# Patient Record
Sex: Male | Born: 1970 | Hispanic: No | Smoking: Current every day smoker
Health system: Southern US, Community
[De-identification: ages and names within clinical notes are randomized; demographics above are authoritative.]

## PROBLEM LIST (undated history)

## (undated) DIAGNOSIS — C801 Malignant (primary) neoplasm, unspecified: Secondary | ICD-10-CM

## (undated) DIAGNOSIS — I10 Essential (primary) hypertension: Secondary | ICD-10-CM

## (undated) DIAGNOSIS — I639 Cerebral infarction, unspecified: Secondary | ICD-10-CM

## (undated) HISTORY — DX: Cerebral infarction, unspecified: I63.9

---

## 2011-01-20 ENCOUNTER — Inpatient Hospital Stay (INDEPENDENT_AMBULATORY_CARE_PROVIDER_SITE_OTHER)
Admission: RE | Admit: 2011-01-20 | Discharge: 2011-01-20 | Disposition: A | Discharge status: Home / Self Care | Payer: Self-pay | Source: Ambulatory Visit | Attending: Family Medicine | Admitting: Family Medicine

## 2011-01-20 DIAGNOSIS — IMO0002 Reserved for concepts with insufficient information to code with codable children: Secondary | ICD-10-CM

## 2017-05-15 ENCOUNTER — Ambulatory Visit (HOSPITAL_COMMUNITY)
Admission: RE | Admit: 2017-05-15 | Discharge: 2017-05-15 | Disposition: A | Discharge status: Home / Self Care | Payer: Self-pay | Source: Ambulatory Visit | Attending: Internal Medicine | Admitting: Internal Medicine

## 2017-05-15 ENCOUNTER — Other Ambulatory Visit (HOSPITAL_COMMUNITY): Payer: Self-pay | Admitting: Internal Medicine

## 2017-05-15 DIAGNOSIS — M79604 Pain in right leg: Secondary | ICD-10-CM

## 2017-05-15 DIAGNOSIS — M79609 Pain in unspecified limb: Secondary | ICD-10-CM | POA: Insufficient documentation

## 2017-05-15 IMAGING — DX DG TIBIA/FIBULA 2V*R*
2 series · 2 of 2 positions shown · non-contrast
Comparison: None in PACs

CLINICAL DATA: Status post fall week 1 week ago with painful
erythematous area over the midshaft of the lower leg medially.

EXAM:
RIGHT TIBIA AND FIBULA - 2 VIEW

[x tib-fib ap right]
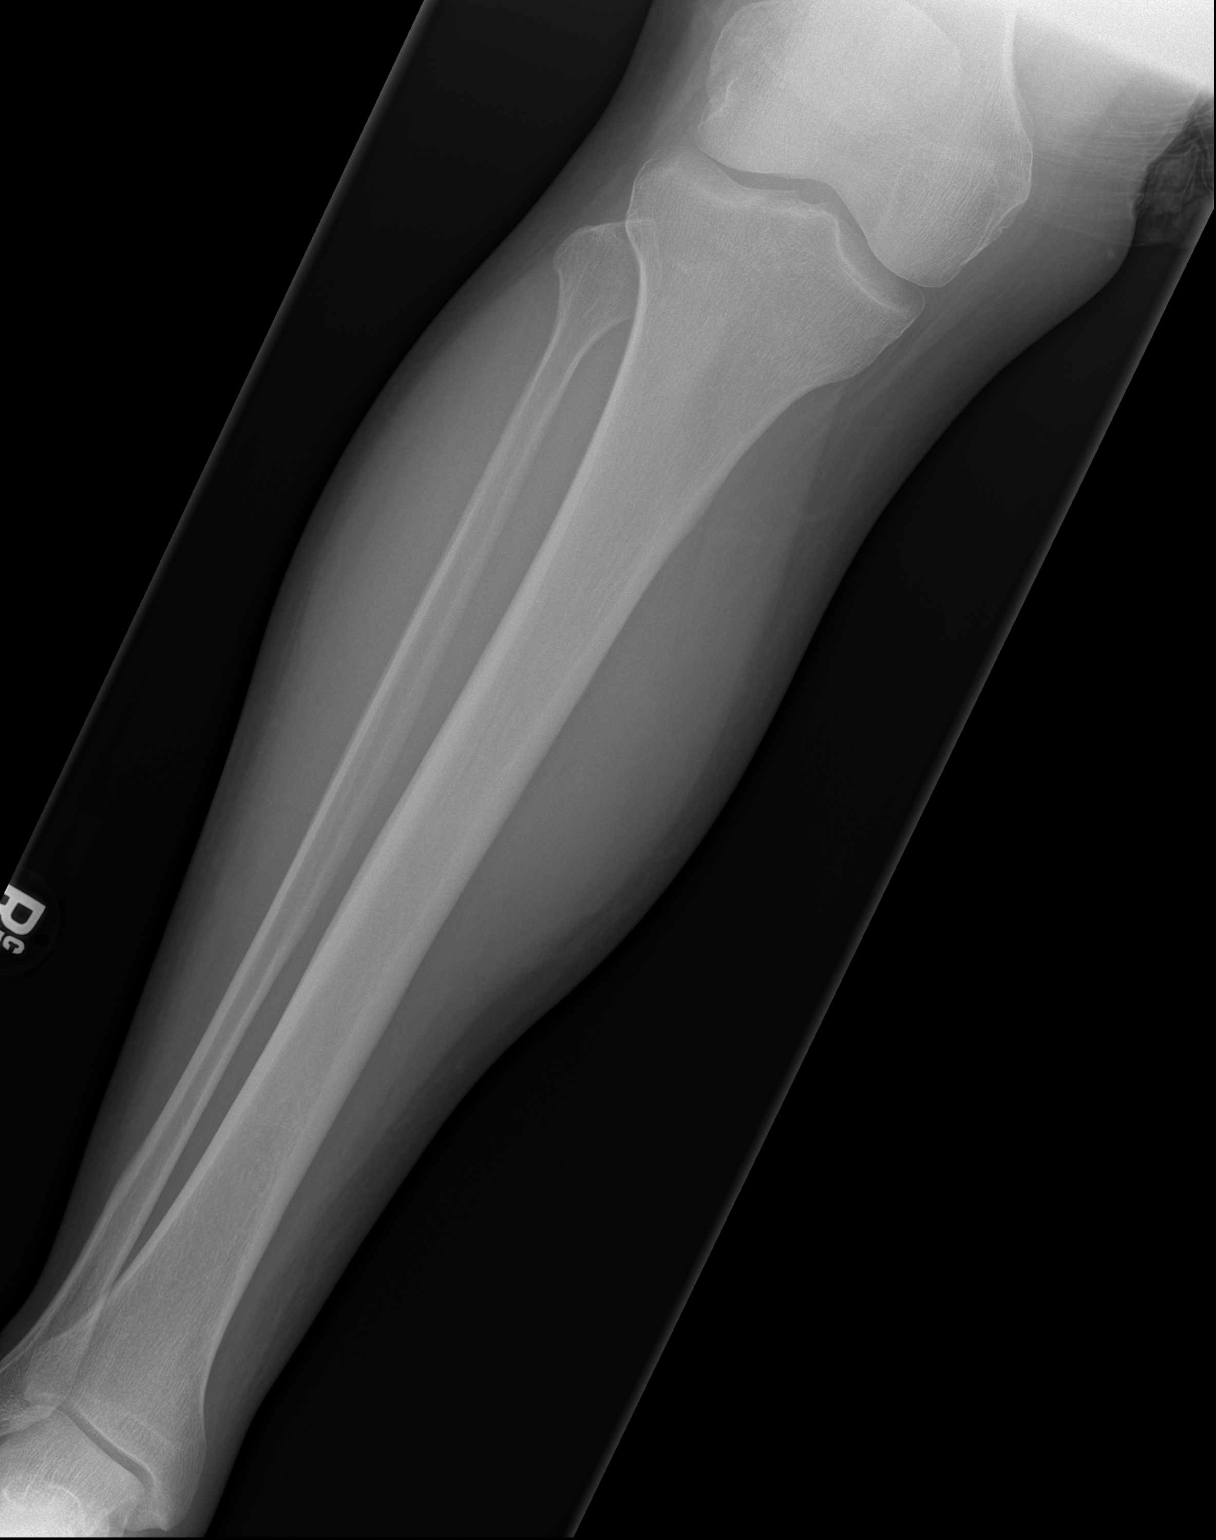

[x tib-fib lat right]
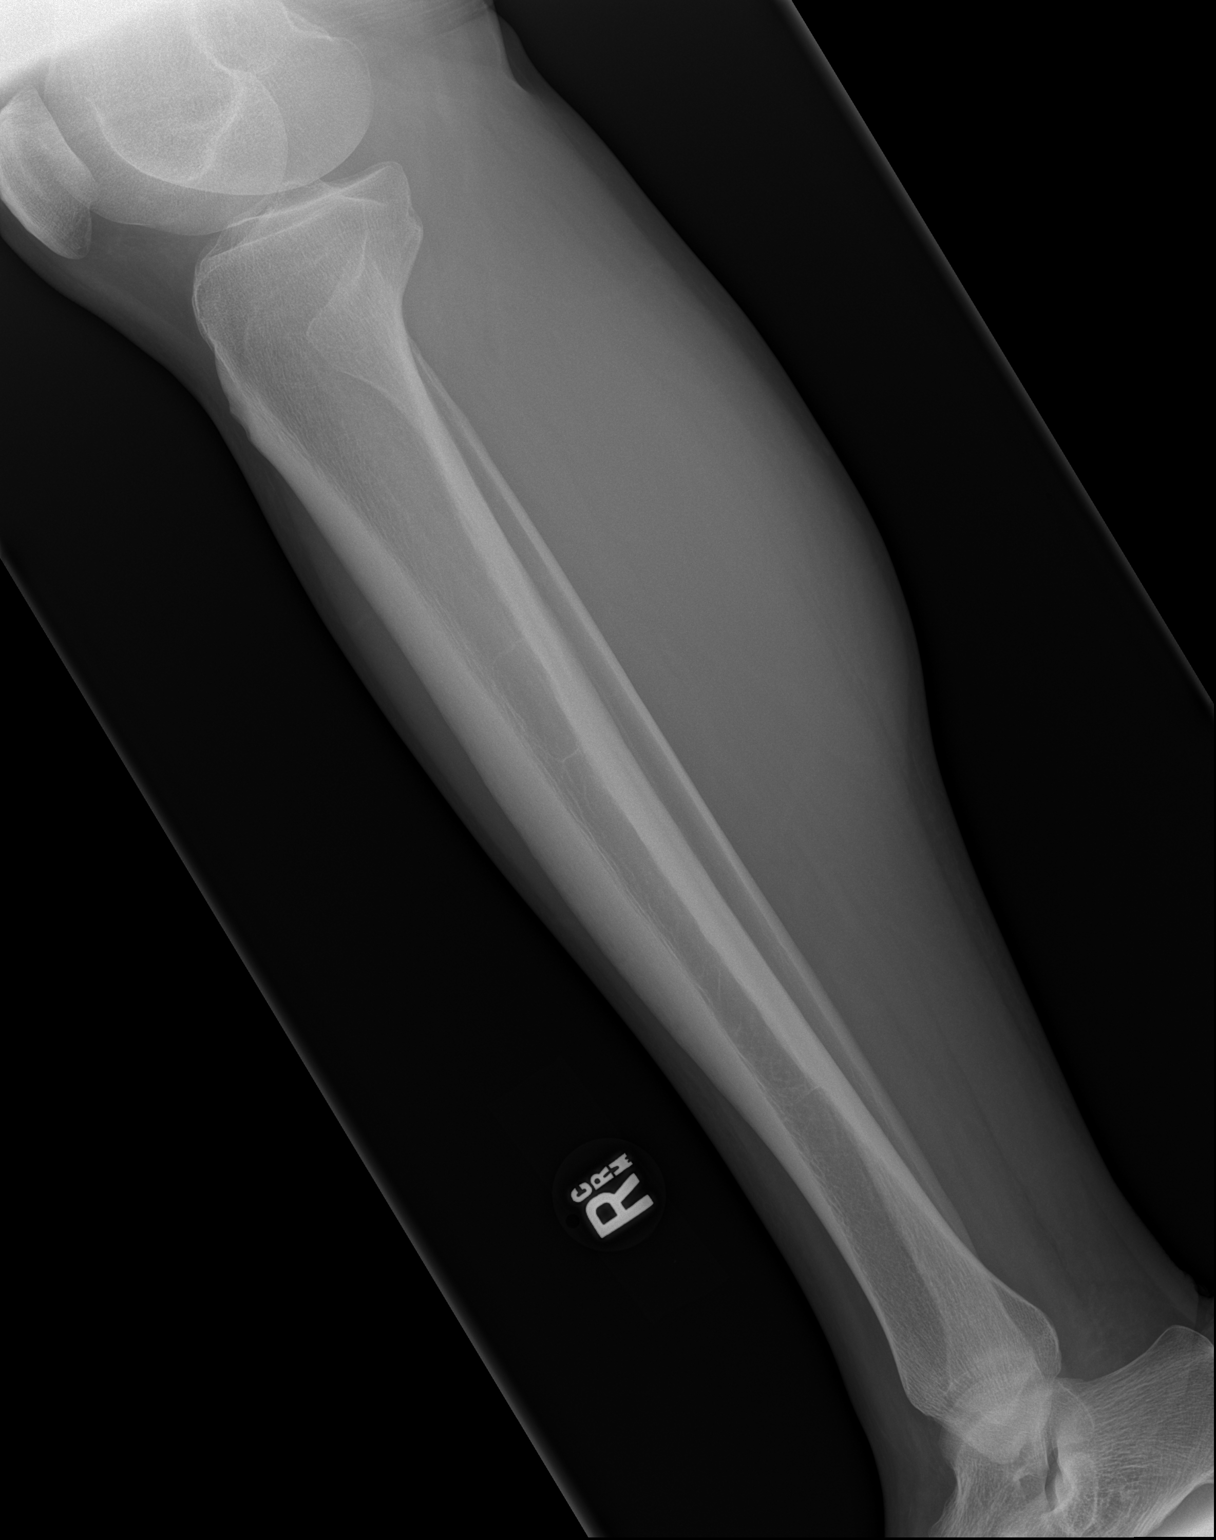

[2 of 2 positions shown; findings below may reference images not displayed]

FINDINGS: The right tibia and fibula are subjectively adequately mineralized.
There is no acute fracture. There is no lytic or blastic lesion. The
observed portions of the knee and ankle are normal. The soft tissues
exhibit no foreign bodies or gas collections. I cannot exclude
minimal pretibial soft tissue swelling.
IMPRESSION: There is no acute or significant chronic bony abnormality of the
right tibia or fibula. Possible mild pretibial soft tissue swelling
over the proximal and midportions of the shin.

## 2020-05-03 ENCOUNTER — Other Ambulatory Visit: Payer: Self-pay

## 2020-05-03 ENCOUNTER — Encounter (HOSPITAL_COMMUNITY): Payer: Self-pay | Admitting: *Deleted

## 2020-05-03 ENCOUNTER — Emergency Department (HOSPITAL_COMMUNITY)
Admission: EM | Admit: 2020-05-03 | Discharge: 2020-05-03 | Disposition: A | Discharge status: Home / Self Care | Payer: Self-pay | Attending: Emergency Medicine | Admitting: Emergency Medicine

## 2020-05-03 DIAGNOSIS — Z5321 Procedure and treatment not carried out due to patient leaving prior to being seen by health care provider: Secondary | ICD-10-CM | POA: Insufficient documentation

## 2020-05-03 DIAGNOSIS — I10 Essential (primary) hypertension: Secondary | ICD-10-CM | POA: Insufficient documentation

## 2020-05-03 LAB — CBC
HCT: 37.3 % — ABNORMAL LOW (ref 39.0–52.0)
Hemoglobin: 12 g/dL — ABNORMAL LOW (ref 13.0–17.0)
MCH: 29.6 pg (ref 26.0–34.0)
MCHC: 32.2 g/dL (ref 30.0–36.0)
MCV: 91.9 fL (ref 80.0–100.0)
Platelets: 272 10*3/uL (ref 150–400)
RBC: 4.06 MIL/uL — ABNORMAL LOW (ref 4.22–5.81)
RDW: 12.6 % (ref 11.5–15.5)
WBC: 8.4 10*3/uL (ref 4.0–10.5)
nRBC: 0 % (ref 0.0–0.2)

## 2020-05-03 LAB — BASIC METABOLIC PANEL
Anion gap: 8 (ref 5–15)
BUN: 13 mg/dL (ref 6–20)
CO2: 26 mmol/L (ref 22–32)
Calcium: 9.5 mg/dL (ref 8.9–10.3)
Chloride: 104 mmol/L (ref 98–111)
Creatinine, Ser: 1.35 mg/dL — ABNORMAL HIGH (ref 0.61–1.24)
GFR calc Af Amer: >60 mL/min (ref >60)
GFR calc non Af Amer: >60 mL/min (ref >60)
Glucose, Bld: 132 mg/dL — ABNORMAL HIGH (ref 70–99)
Potassium: 3.9 mmol/L (ref 3.5–5.1)
Sodium: 138 mmol/L (ref 135–145)

## 2020-05-03 NOTE — ED Triage Notes (Signed)
Pt says his blood pressure has been reading 140-160's at home. Denies ha, chest pain or sob. Just feels like something is wrong.

## 2020-05-03 NOTE — ED Notes (Signed)
Called pt name x3 for VS recheck. No response from pt.

## 2020-09-01 ENCOUNTER — Other Ambulatory Visit: Payer: Self-pay

## 2020-09-01 ENCOUNTER — Encounter (HOSPITAL_COMMUNITY): Payer: Self-pay | Admitting: Emergency Medicine

## 2020-09-01 ENCOUNTER — Other Ambulatory Visit: Payer: Self-pay | Admitting: Oncology

## 2020-09-01 ENCOUNTER — Emergency Department (HOSPITAL_COMMUNITY): Payer: 59

## 2020-09-01 ENCOUNTER — Emergency Department (HOSPITAL_COMMUNITY)
Admission: EM | Admit: 2020-09-01 | Discharge: 2020-09-01 | Disposition: A | Discharge status: Home / Self Care | Payer: 59 | Attending: Emergency Medicine | Admitting: Emergency Medicine

## 2020-09-01 DIAGNOSIS — N2889 Other specified disorders of kidney and ureter: Secondary | ICD-10-CM | POA: Diagnosis not present

## 2020-09-01 DIAGNOSIS — R109 Unspecified abdominal pain: Secondary | ICD-10-CM | POA: Diagnosis present

## 2020-09-01 DIAGNOSIS — Z129 Encounter for screening for malignant neoplasm, site unspecified: Secondary | ICD-10-CM

## 2020-09-01 LAB — CBC
HCT: 41.1 % (ref 39.0–52.0)
Hemoglobin: 13.1 g/dL (ref 13.0–17.0)
MCH: 29.4 pg (ref 26.0–34.0)
MCHC: 31.9 g/dL (ref 30.0–36.0)
MCV: 92.2 fL (ref 80.0–100.0)
Platelets: 308 10*3/uL (ref 150–400)
RBC: 4.46 MIL/uL (ref 4.22–5.81)
RDW: 13.7 % (ref 11.5–15.5)
WBC: 9.3 10*3/uL (ref 4.0–10.5)
nRBC: 0 % (ref 0.0–0.2)

## 2020-09-01 LAB — URINALYSIS, ROUTINE W REFLEX MICROSCOPIC
Bilirubin Urine: NEGATIVE
Glucose, UA: NEGATIVE mg/dL
Hgb urine dipstick: NEGATIVE
Ketones, ur: NEGATIVE mg/dL
Leukocytes,Ua: NEGATIVE
Nitrite: NEGATIVE
Protein, ur: NEGATIVE mg/dL
Specific Gravity, Urine: 1.01 (ref 1.005–1.030)
pH: 5.5 (ref 5.0–8.0)

## 2020-09-01 LAB — BASIC METABOLIC PANEL
Anion gap: 11 (ref 5–15)
BUN: 16 mg/dL (ref 6–20)
CO2: 25 mmol/L (ref 22–32)
Calcium: 9.9 mg/dL (ref 8.9–10.3)
Chloride: 99 mmol/L (ref 98–111)
Creatinine, Ser: 1.21 mg/dL (ref 0.61–1.24)
GFR, Estimated: >60 mL/min (ref >60)
Glucose, Bld: 163 mg/dL — ABNORMAL HIGH (ref 70–99)
Potassium: 4 mmol/L (ref 3.5–5.1)
Sodium: 135 mmol/L (ref 135–145)

## 2020-09-01 IMAGING — CR DG CHEST 2V
2 series · 2 of 2 positions shown · non-contrast
Comparison: None.

CLINICAL DATA: Malignancy workup. Per chart review patient
reportedly went to outside ED where they were concern for renal cell
carcinoma with pulmonary metastases.

EXAM:
CHEST - 2 VIEW

[chest pa]
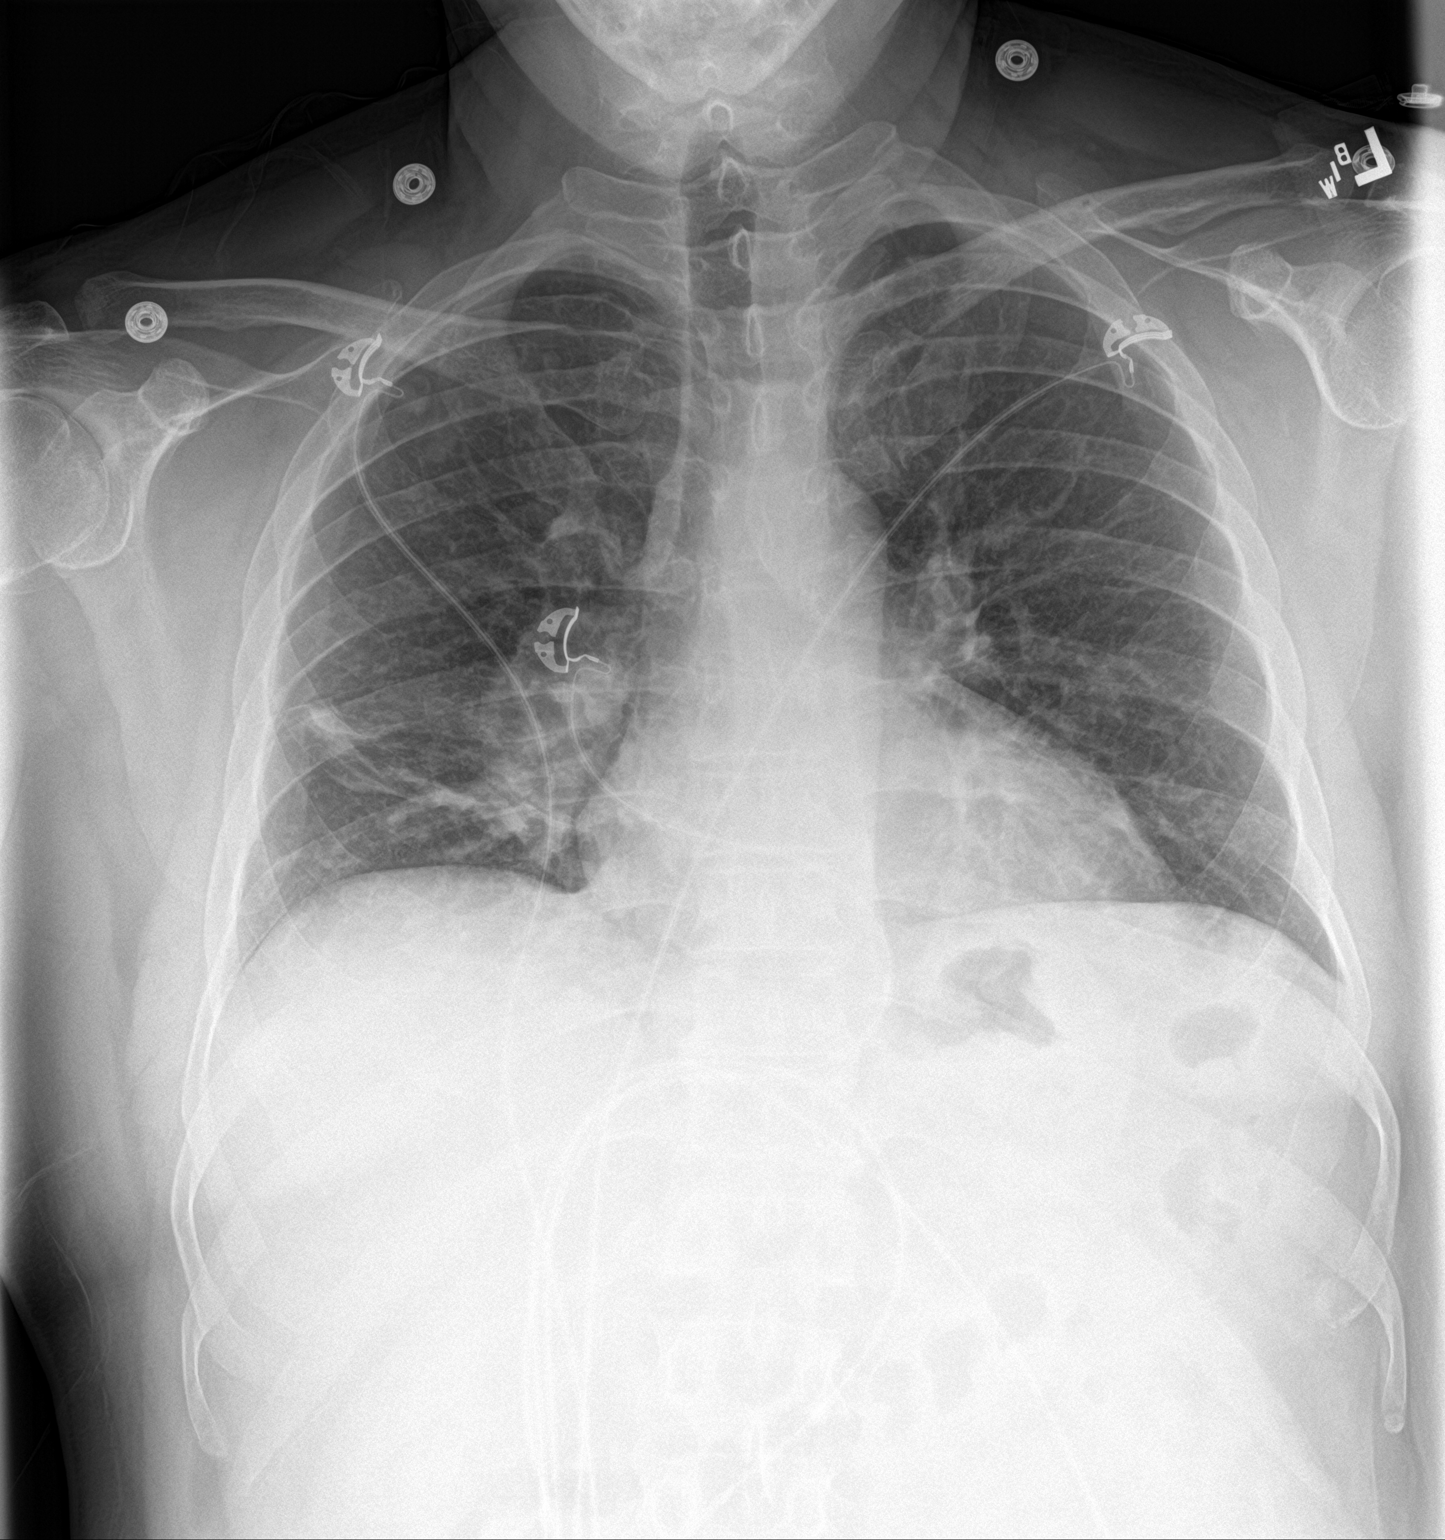

[chest lat]
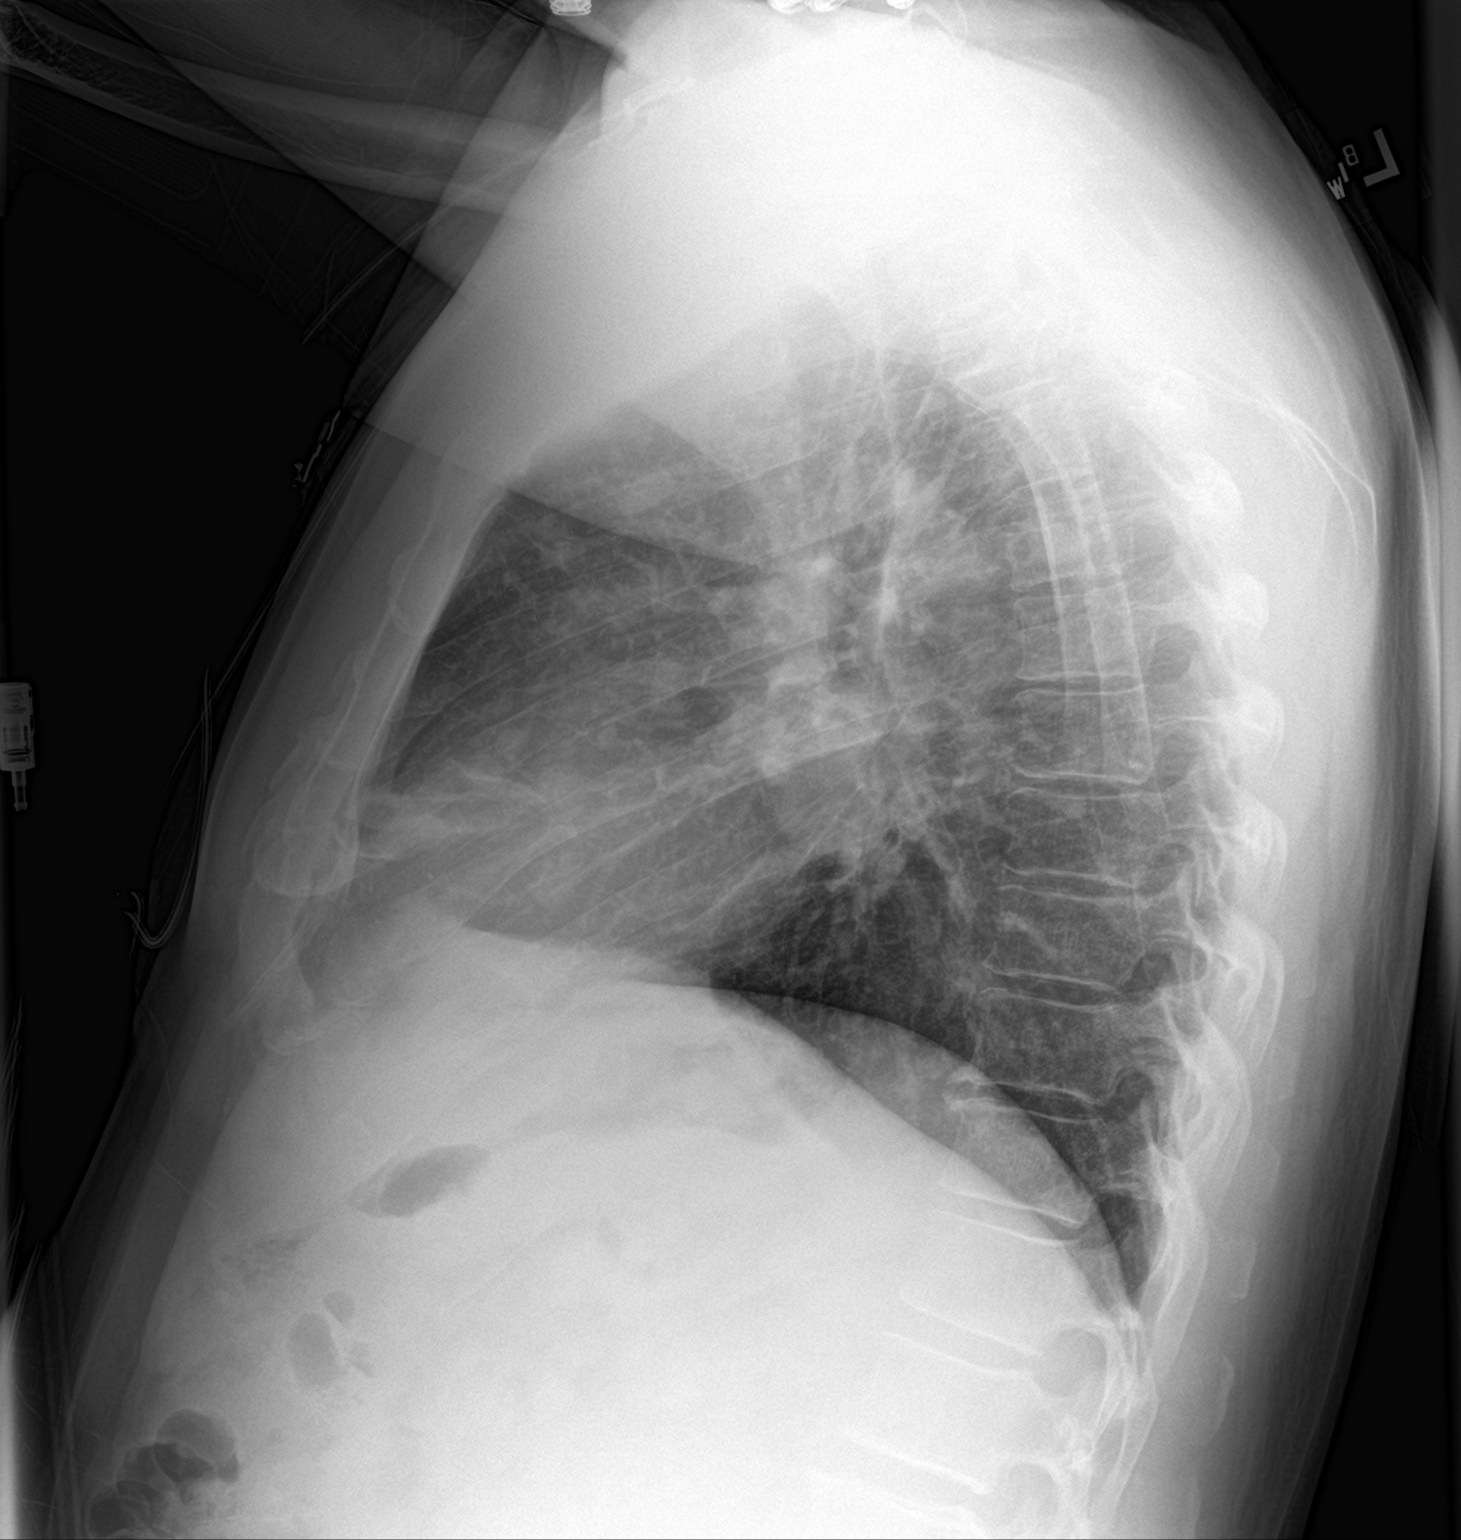

[2 of 2 positions shown; findings below may reference images not displayed]

FINDINGS: Right infrahilar prominence. Additionally, there is a 1 cm nodular
opacity in the right upper lung and the left midlung. Linear
opacities in the right lower lobe.No visible pneumothorax or pleural
effusions. Please see outside chest CT for characterization of
reported right rib fracture.
IMPRESSION: 1. Multiple nodular opacities in the lungs. Recommend correlation
with outside imaging or repeat chest CT to evaluate for metastases
given the clinical history.
2. Please see outside chest CT for characterization of reported
right rib fracture. Right basilar atelectasis.

Findings discussed with ERXLEBEN at [DATE] p.m via telephone.

## 2020-09-01 IMAGING — CT CT CHEST-ABD-PELV W/ CM
2 of 5 series · 14 of 46 positions shown, 16 images · IV contrast (omnipaque)
Comparison: Plain film chest of earlier today.

CLINICAL DATA: Right-sided flank pain. Outside CT suspicious for
renal cell carcinoma with pulmonary metastasis.

EXAM:
CT CHEST, ABDOMEN, AND PELVIS WITH CONTRAST
TECHNIQUE: Multidetector CT imaging of the chest, abdomen and pelvis was
performed following the standard protocol during bolus
administration of intravenous contrast.
CONTRAST:  100mL OMNIPAQUE IOHEXOL 300 MG/ML  SOLN

[Series 3: cap with · axial · 0.82mm/px · z∈[-545,+5]mm · 11 of 132 slices shown, 13 images]
[im 11/132  soft-tissue]
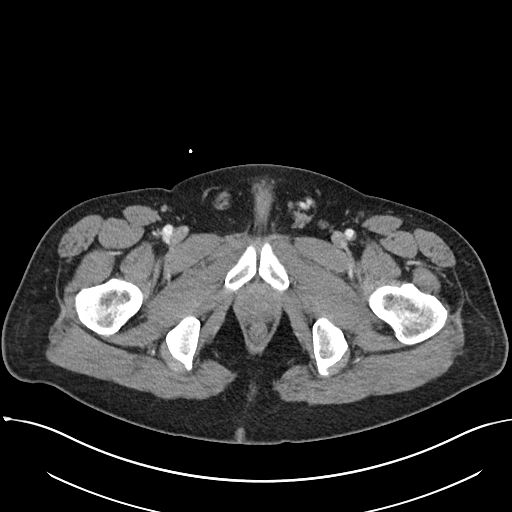
[im 11/132  bone]
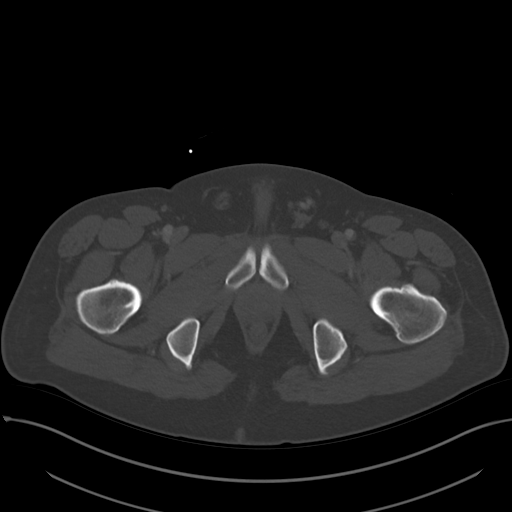
[im 22/132  soft-tissue]
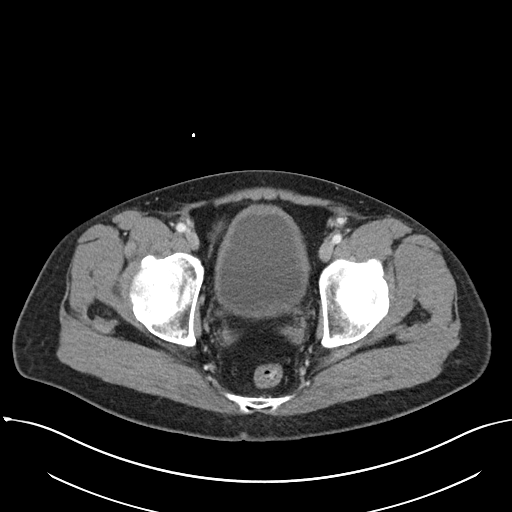
[im 33/132  soft-tissue]
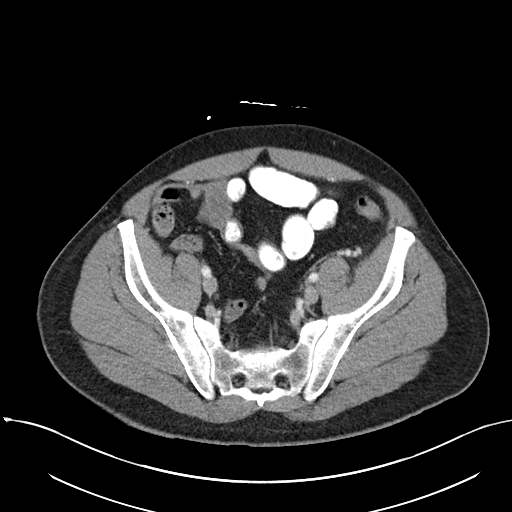
[im 44/132  soft-tissue]
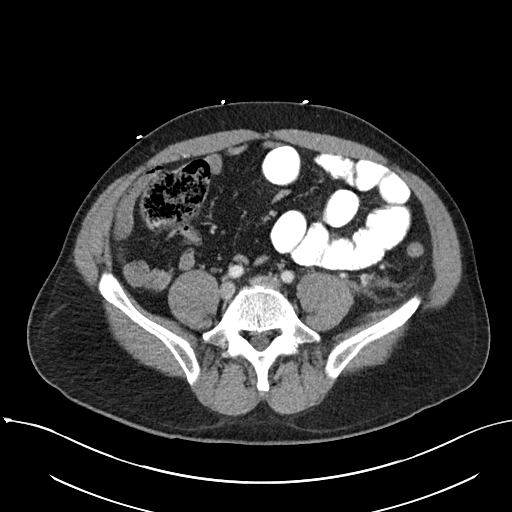
[im 55/132  soft-tissue]
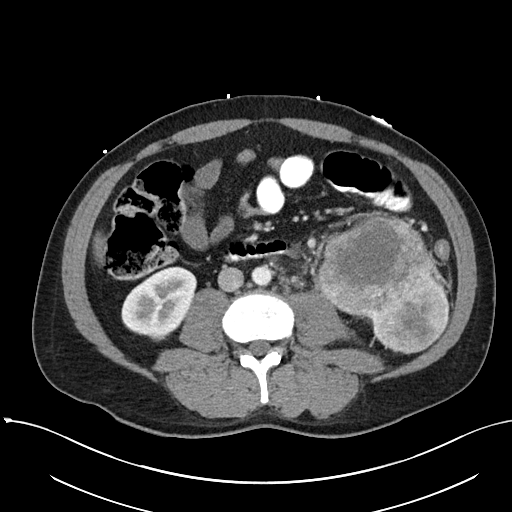
[im 66/132  soft-tissue]
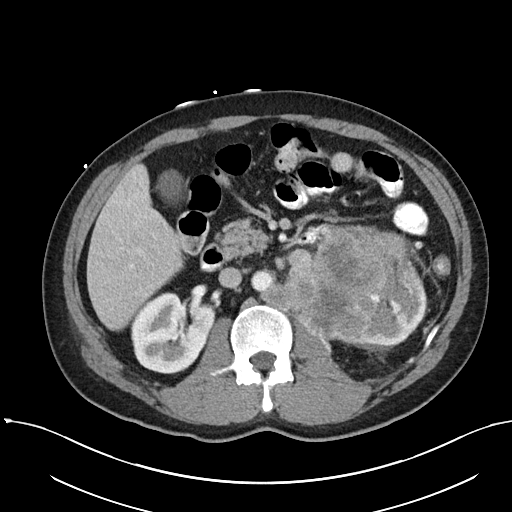
[im 77/132  soft-tissue]
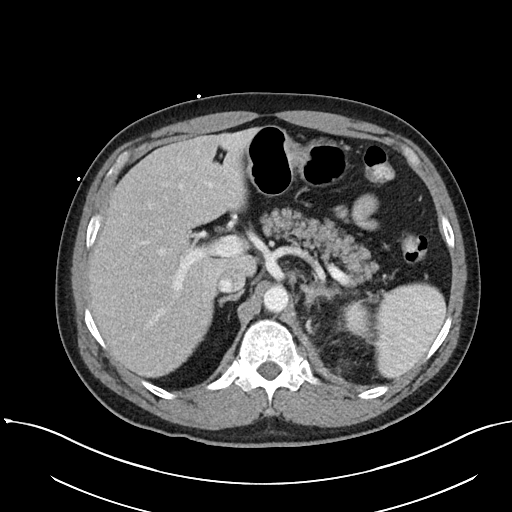
[im 88/132  soft-tissue]
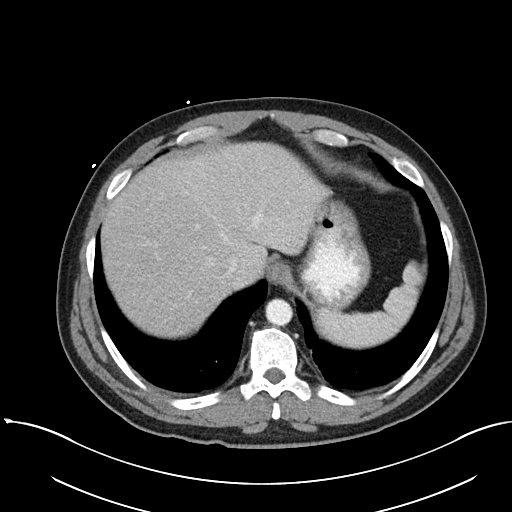
[im 99/132  soft-tissue]
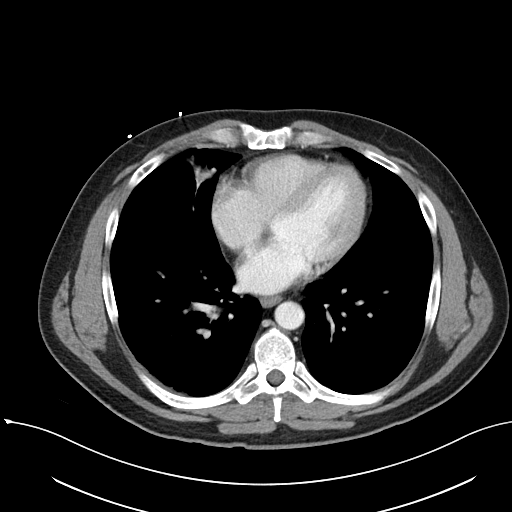
[im 99/132  bone]
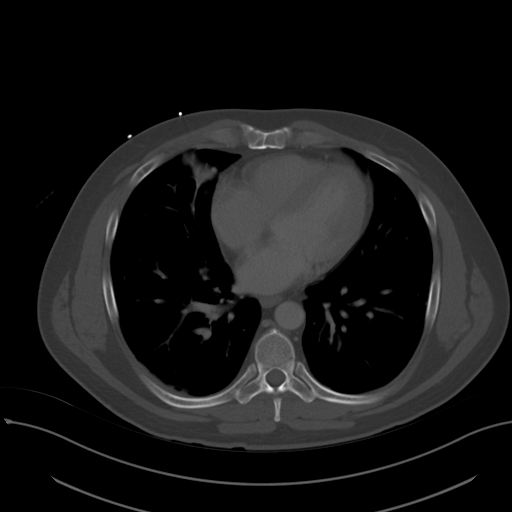
[im 110/132  soft-tissue]
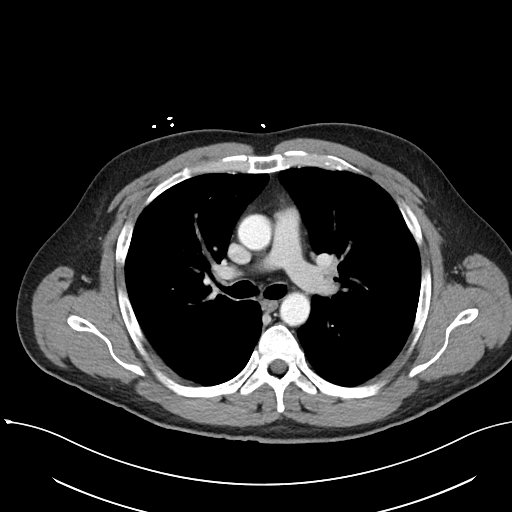
[im 121/132  soft-tissue]
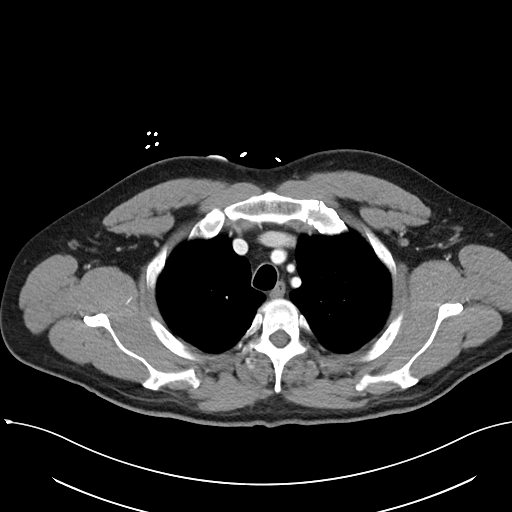

[Series 6: cor · coronal · 0.87mm/px · 3 of 109 slices shown]
[im 37/109  soft-tissue]
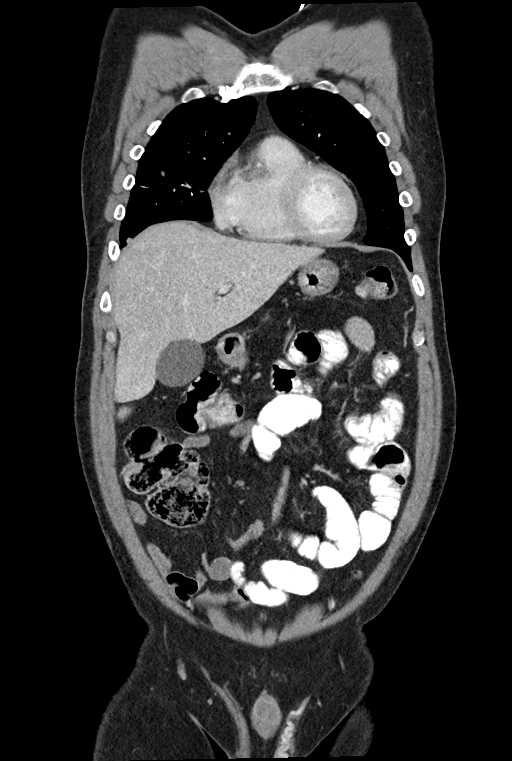
[im 49/109  soft-tissue]
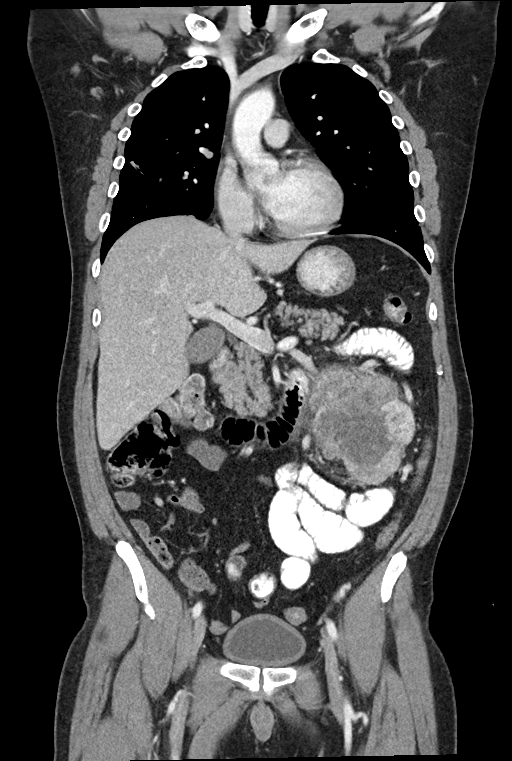
[im 61/109  soft-tissue]
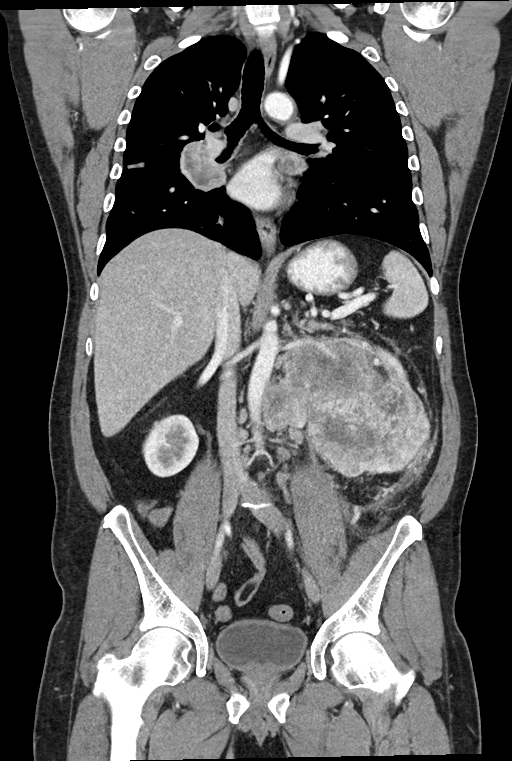

[14 of 46 positions shown; findings below may reference images not displayed]

FINDINGS: CT CHEST FINDINGS

Cardiovascular: Normal aortic caliber. Normal heart size, without
pericardial effusion.

No central pulmonary embolism, on this non-dedicated study.

Mediastinum/Nodes: Low right mediastinal node of 1.3 cm on [DATE].

Right infrahilar nodal mass of 3.3 by 2.3 cm on [DATE]. Right hilar
adenopathy at 2.5 cm on [DATE].

Left lower mediastinal nodal mass of 2.1 cm on [DATE].

Lungs/Pleura: No pleural fluid. Innumerable bilateral pulmonary
nodules consistent with metastasis.

Index right lower lobe pulmonary nodule of 1.0 cm on 108/5.

Left lower lobe index 1.2 cm nodule on 105/5.

Index left upper lobe pulmonary nodule of 1.2 cm on 45/5.

Musculoskeletal: Posterior left humeral head lytic lesion of 1.4 cm
on [DATE].

Nondisplaced fracture of the right ninth rib with suggestion of
underlying lucency on 42/3.

CT ABDOMEN PELVIS FINDINGS

Hepatobiliary: Normal liver. Normal gallbladder, without biliary
ductal dilatation.

Pancreas: Normal, without mass or ductal dilatation.

Spleen: Normal in size, without focal abnormality.

Adrenals/Urinary Tract: Normal adrenal glands. Normal right kidney.
Infiltrative mass or masses replace the majority of the inter and
lower pole left kidney. An exophytic somewhat more
well-circumscribed mass or component off the lower pole measures
x 5.1 cm on 79/3. The more diffuse infiltrative component extends
extrarenally medially to the level of the vertebral body including
at 9.5 x 8.9 cm on 65/3. Decreased left renal function, as evidenced
by absent contrast excretion.

Normal urinary bladder.

Stomach/Bowel: Normal stomach, without wall thickening. Normal
colon, appendix, and terminal ileum. Normal small bowel.

Vascular/Lymphatic: Aortic atherosclerosis. Tumor extending left
renal vein including on 62/3.

Abdominal retroperitoneal nodal metastasis, including index 1.7 by
2.4 cm left paraaortic node on 73/3.

No pelvic sidewall adenopathy.

Reproductive: Normal prostate.  Left-sided varicocele on 132/3.

Other: No significant free fluid.

Musculoskeletal: No acute osseous abnormality.
IMPRESSION: 1. Infiltrative left renal cell carcinoma with thoracoabdominal
nodal, pulmonary, and osseous metastasis as detailed above.
2. Extension in the left renal vein without IVC involvement.
3. Nondisplaced right ninth rib fracture, suspicious for pathologic
fracture.
4. The clinical history describes suspicion of pulmonary embolism.
Please note that the exam was not protocolled to evaluate for
pulmonary embolism. No large or central embolism identified.

## 2020-09-01 MED ORDER — MORPHINE SULFATE (PF) 2 MG/ML IV SOLN
2.0000 mg | Freq: Once | INTRAVENOUS | Status: AC
  Administered 2020-09-01: 2 mg via INTRAVENOUS
  Filled 2020-09-01: qty 1

## 2020-09-01 MED ORDER — IOHEXOL 300 MG/ML  SOLN
100.0000 mL | Freq: Once | INTRAMUSCULAR | Status: AC | PRN
  Administered 2020-09-01: 100 mL via INTRAVENOUS

## 2020-09-01 MED ORDER — HYDROCODONE-ACETAMINOPHEN 5-325 MG PO TABS: 1.0000 | ORAL_TABLET | Freq: Four times a day (QID) | ORAL | 0 refills | Status: DC | PRN

## 2020-09-01 MED ORDER — IOHEXOL 9 MG/ML PO SOLN
ORAL | Status: AC
  Filled 2020-09-01: qty 500

## 2020-09-01 NOTE — ED Provider Notes (Signed)
[facility] EMERGENCY DEPARTMENT Provider Note   CSN: [id number] Arrival date & time: 09/01/20  1259     History Chief Complaint  Patient presents with  . Flank Pain    Bob Ewing is a 50 y.o. male.  50 year old male with prior medical history as detailed below presents for evaluation.  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  Yesterday he was in the area of [place].  He experienced sudden onset right sided posterior chest pain.  He presented to a local ER in [place].  He was scanned.  He was eventually diagnosed with likely left renal mass with likely metastatic disease and a pathologic right posterior ninth rib fracture.  Reports from his CT images are included below.  Patient was advised to follow-up closely with local care in [place] where he lives.  He will require further oncologic work-up and treatment.  He presented to the ED today requesting that he be referred to oncology for further evaluation.  The history is provided by the patient and medical records.  Illness Location:  Likely left renal cancer with metastatic disease found by outside facility  Severity:  Moderate Onset quality:  Unable to specify Timing:  Constant Progression:  Unchanged Chronicity:  New      History reviewed. No pertinent past medical history.  There are no problems to display for this patient.   History reviewed. No pertinent surgical history.     History reviewed. No pertinent family history.     Home Medications Prior to Admission medications   Not on File    Allergies    Patient has no known allergies.  Review of Systems   Review of Systems  All other systems reviewed and are negative.   Physical Exam Updated Vital Signs BP (!) 145/77   Pulse 86   Temp 98.3 F (36.8 C)   Resp 20   Ht 5\' 8"  (1.727 m)   Wt 90.3 kg   SpO2 99%   BMI 30.26 kg/m   Physical Exam Vitals and nursing note reviewed.   Constitutional:      General: He is not in acute distress.    Appearance: He is well-developed and well-nourished.  HENT:     Head: Normocephalic and atraumatic.     Mouth/Throat:     Mouth: Oropharynx is clear and moist.  Eyes:     Extraocular Movements: EOM normal.     Conjunctiva/sclera: Conjunctivae normal.     Pupils: Pupils are equal, round, and reactive to light.  Cardiovascular:     Rate and Rhythm: Normal rate and regular rhythm.     Heart sounds: Normal heart sounds.  Pulmonary:     Effort: Pulmonary effort is normal. No respiratory distress.     Breath sounds: Normal breath sounds.  Abdominal:     General: There is no distension.     Palpations: Abdomen is soft.     Tenderness: There is no abdominal tenderness.  Musculoskeletal:        General: No deformity or edema. Normal range of motion.     Cervical back: Normal range of motion and neck supple.  Skin:    General: Skin is warm and dry.  Neurological:     Mental Status: He is alert and oriented to person, place, and time.  Psychiatric:        Mood and Affect: Mood and affect normal.     ED Results / Procedures / Treatments  Labs (all labs ordered are listed, but only abnormal results are displayed) Labs Reviewed  BASIC METABOLIC PANEL - Abnormal; Notable for the following components:      Result Value   Glucose, Bld 163 (*)    All other components within normal limits  URINALYSIS, ROUTINE W REFLEX MICROSCOPIC  CBC    EKG None  Radiology No results found.  Procedures Procedures   Medications Ordered in ED Medications  morphine 2 MG/ML injection 2 mg (has no administration in time range)    Outside Facility Results              ED Course  I have reviewed the triage vital signs and the nursing notes.  Pertinent labs & imaging results that were available during my care of the patient were reviewed by me and considered in my medical decision making (see chart for details).     MDM Rules/Calculators/A&P                          MDM  Screen complete  Bob Ewing was evaluated in Emergency Department on 09/01/2020 for the symptoms described in the history of present illness. He was evaluated in the context of the global COVID-19 pandemic, which necessitated consideration that the patient might be at risk for infection with the SARS-CoV-2 virus that causes COVID-19. Institutional protocols and algorithms that pertain to the evaluation of patients at risk for COVID-19 are in a state of rapid change based on information released by regulatory bodies including the CDC and federal and state organizations. These policies and algorithms were followed during the patient's care in the ED.  Patient is presenting for evaluation.  He was diagnosed with likely left renal cancer with metastatic disease.  Patient is requesting intake by oncologic team.  Patient's case discussed with Dr. Jana Hakim of oncology.  Patient will be evaluated in the outpatient setting.  Dr. Jana Hakim did request repeat imaging of the chest, abdomen, and pelvis today.  Understands need for close follow-up.  He knows to expect a phone call from the medical oncology team tomorrow to help arrange further outpatient work-up and treatment.  He did request pain medication for his rib fracture.  Will prescribe a short course of Norco for treatment of this.  Final Clinical Impression(s) / ED Diagnoses Final diagnoses:  Renal mass    Rx / DC Orders ED Discharge Orders         Ordered    HYDROcodone-acetaminophen (NORCO/VICODIN) 5-325 MG tablet  Every 6 hours PRN        09/01/20 1905           Valarie Merino, MD 09/01/20 1907

## 2020-09-01 NOTE — Discharge Instructions (Addendum)
Please return for any problem.   You should be called by the [facility] health medical oncology team tomorrow.  They will help arrange for further work-up and treatment of your suspected cancer.

## 2020-09-01 NOTE — ED Triage Notes (Signed)
Pt arrives to ED POV with c/o right sided flank pain. 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 The ED there ran scans and were concerned for renal cell carcinoma w/ pulmonary metastasis.

## 2020-09-01 NOTE — ED Notes (Signed)
Patient transported to x-ray. ?

## 2020-09-02 ENCOUNTER — Other Ambulatory Visit: Payer: Self-pay | Admitting: Oncology

## 2020-09-02 DIAGNOSIS — C778 Secondary and unspecified malignant neoplasm of lymph nodes of multiple regions: Secondary | ICD-10-CM

## 2020-09-03 ENCOUNTER — Other Ambulatory Visit: Payer: Self-pay

## 2020-09-03 ENCOUNTER — Encounter (HOSPITAL_COMMUNITY): Payer: Self-pay | Admitting: Emergency Medicine

## 2020-09-03 ENCOUNTER — Emergency Department (HOSPITAL_COMMUNITY): Payer: 59

## 2020-09-03 ENCOUNTER — Emergency Department (HOSPITAL_COMMUNITY)
Admission: EM | Admit: 2020-09-03 | Discharge: 2020-09-04 | Disposition: A | Discharge status: Home / Self Care | Payer: 59 | Attending: Emergency Medicine | Admitting: Emergency Medicine

## 2020-09-03 DIAGNOSIS — Z5321 Procedure and treatment not carried out due to patient leaving prior to being seen by health care provider: Secondary | ICD-10-CM | POA: Insufficient documentation

## 2020-09-03 DIAGNOSIS — W19XXXA Unspecified fall, initial encounter: Secondary | ICD-10-CM | POA: Diagnosis not present

## 2020-09-03 DIAGNOSIS — S299XXA Unspecified injury of thorax, initial encounter: Secondary | ICD-10-CM | POA: Diagnosis not present

## 2020-09-03 LAB — CBC WITH DIFFERENTIAL/PLATELET
Abs Immature Granulocytes: 0.08 K/uL — ABNORMAL HIGH (ref 0.00–0.07)
Basophils Absolute: 0 K/uL (ref 0.0–0.1)
Basophils Relative: 0 %
Eosinophils Absolute: 0.1 K/uL (ref 0.0–0.5)
Eosinophils Relative: 1 %
HCT: 43.3 % (ref 39.0–52.0)
Hemoglobin: 13.8 g/dL (ref 13.0–17.0)
Immature Granulocytes: 1 %
Lymphocytes Relative: 19 %
Lymphs Abs: 1.8 K/uL (ref 0.7–4.0)
MCH: 29.4 pg (ref 26.0–34.0)
MCHC: 31.9 g/dL (ref 30.0–36.0)
MCV: 92.1 fL (ref 80.0–100.0)
Monocytes Absolute: 0.6 K/uL (ref 0.1–1.0)
Monocytes Relative: 6 %
Neutro Abs: 6.9 K/uL (ref 1.7–7.7)
Neutrophils Relative %: 73 %
Platelets: 344 K/uL (ref 150–400)
RBC: 4.7 MIL/uL (ref 4.22–5.81)
RDW: 13.8 % (ref 11.5–15.5)
WBC: 9.5 K/uL (ref 4.0–10.5)
nRBC: 0 % (ref 0.0–0.2)

## 2020-09-03 LAB — BASIC METABOLIC PANEL WITH GFR
Anion gap: 11 (ref 5–15)
BUN: 22 mg/dL — ABNORMAL HIGH (ref 6–20)
CO2: 27 mmol/L (ref 22–32)
Calcium: 10.4 mg/dL — ABNORMAL HIGH (ref 8.9–10.3)
Chloride: 99 mmol/L (ref 98–111)
Creatinine, Ser: 1.24 mg/dL (ref 0.61–1.24)
GFR, Estimated: >60 mL/min
Glucose, Bld: 96 mg/dL (ref 70–99)
Potassium: 4.2 mmol/L (ref 3.5–5.1)
Sodium: 137 mmol/L (ref 135–145)

## 2020-09-03 IMAGING — CR DG RIBS W/ CHEST 3+V*R*
3 series · 3 of 3 positions shown · non-contrast
Comparison: [DATE]

CLINICAL DATA: Fall injury on the right side

EXAM:
RIGHT RIBS AND CHEST - 3+ VIEW

[chest pa]
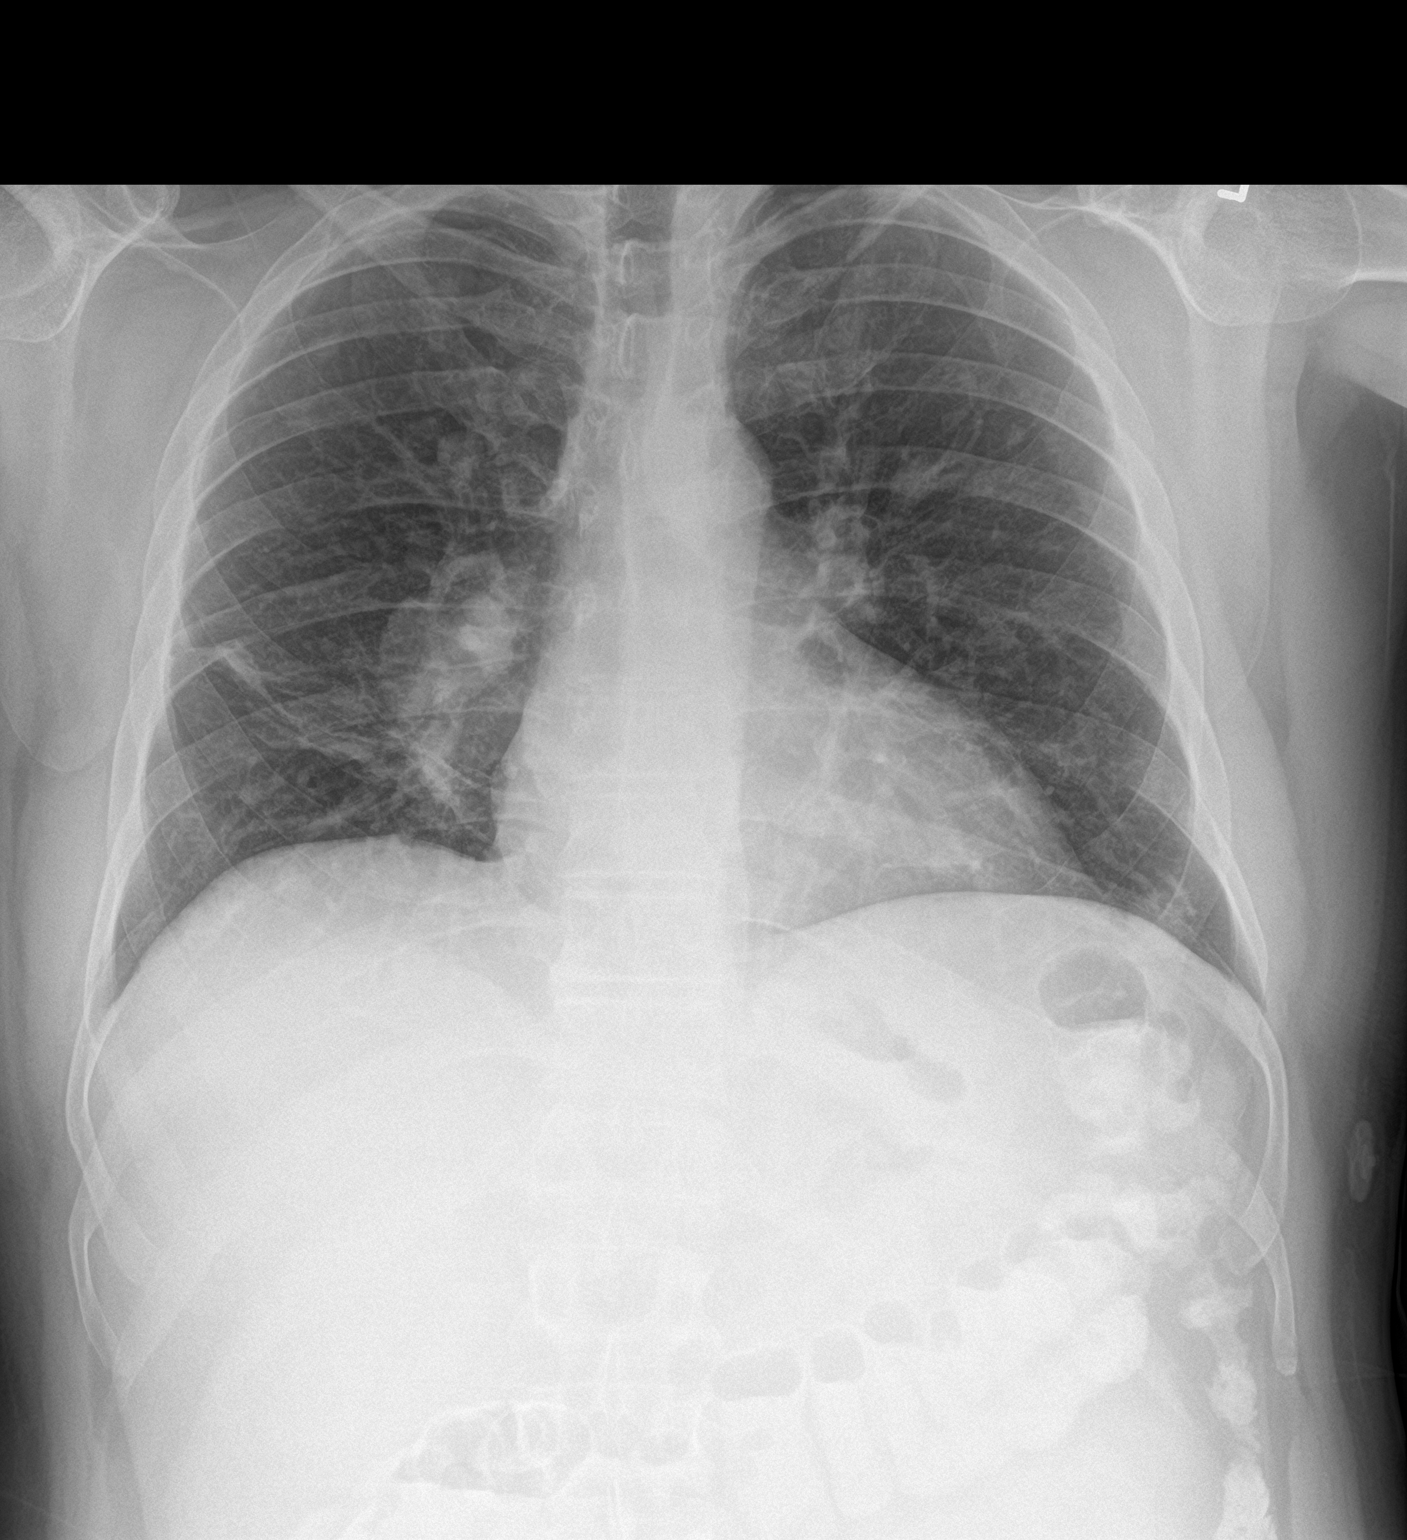

[rib ap]
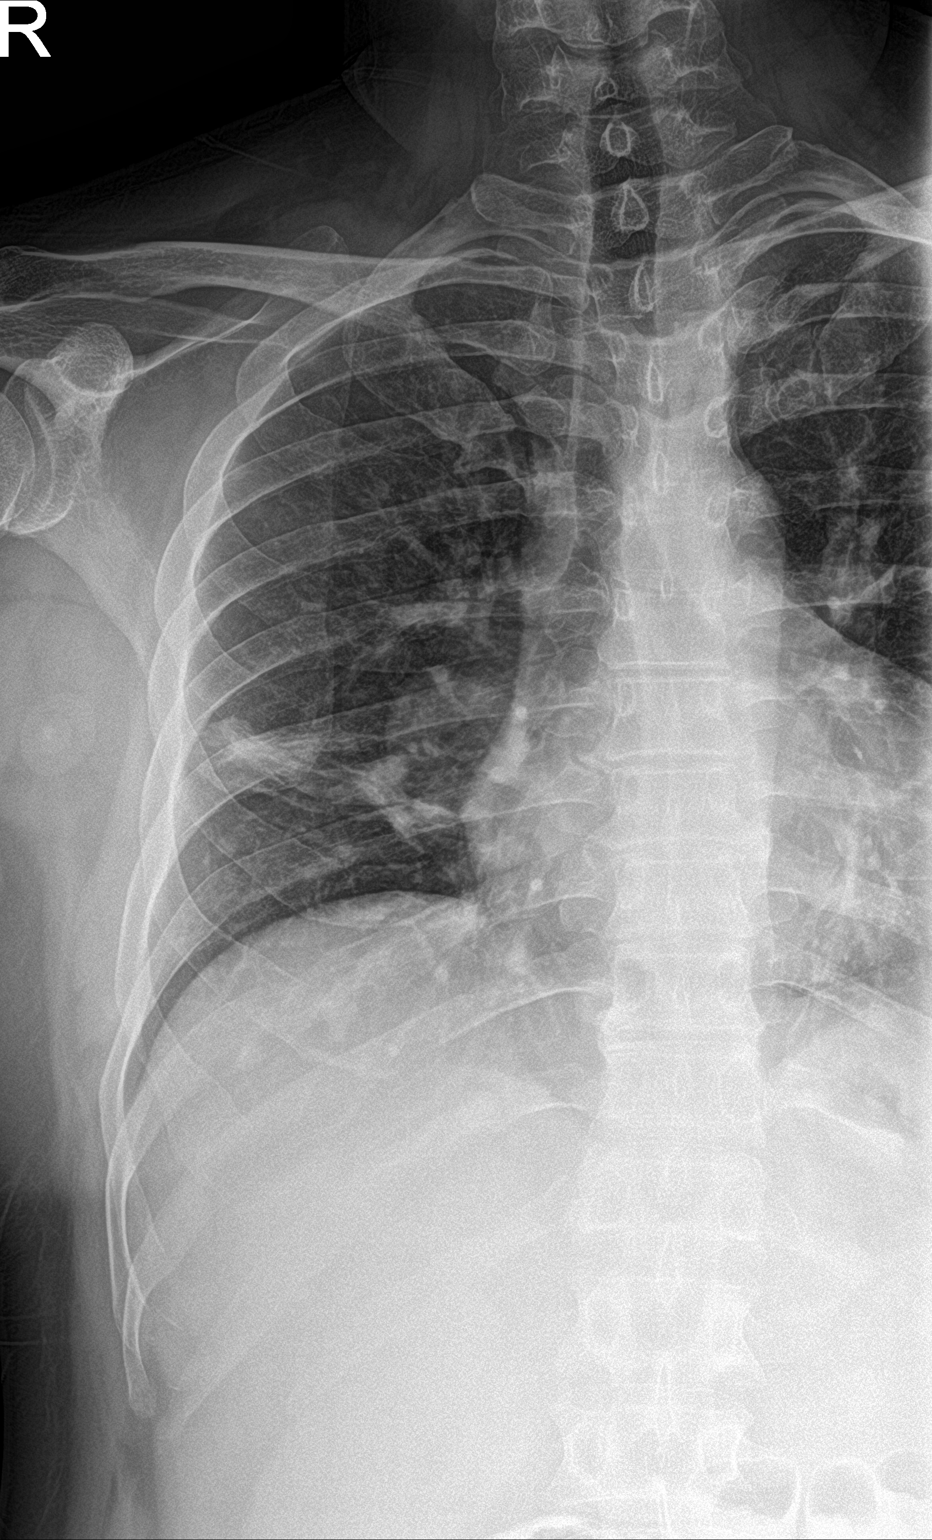

[rib ap obl]
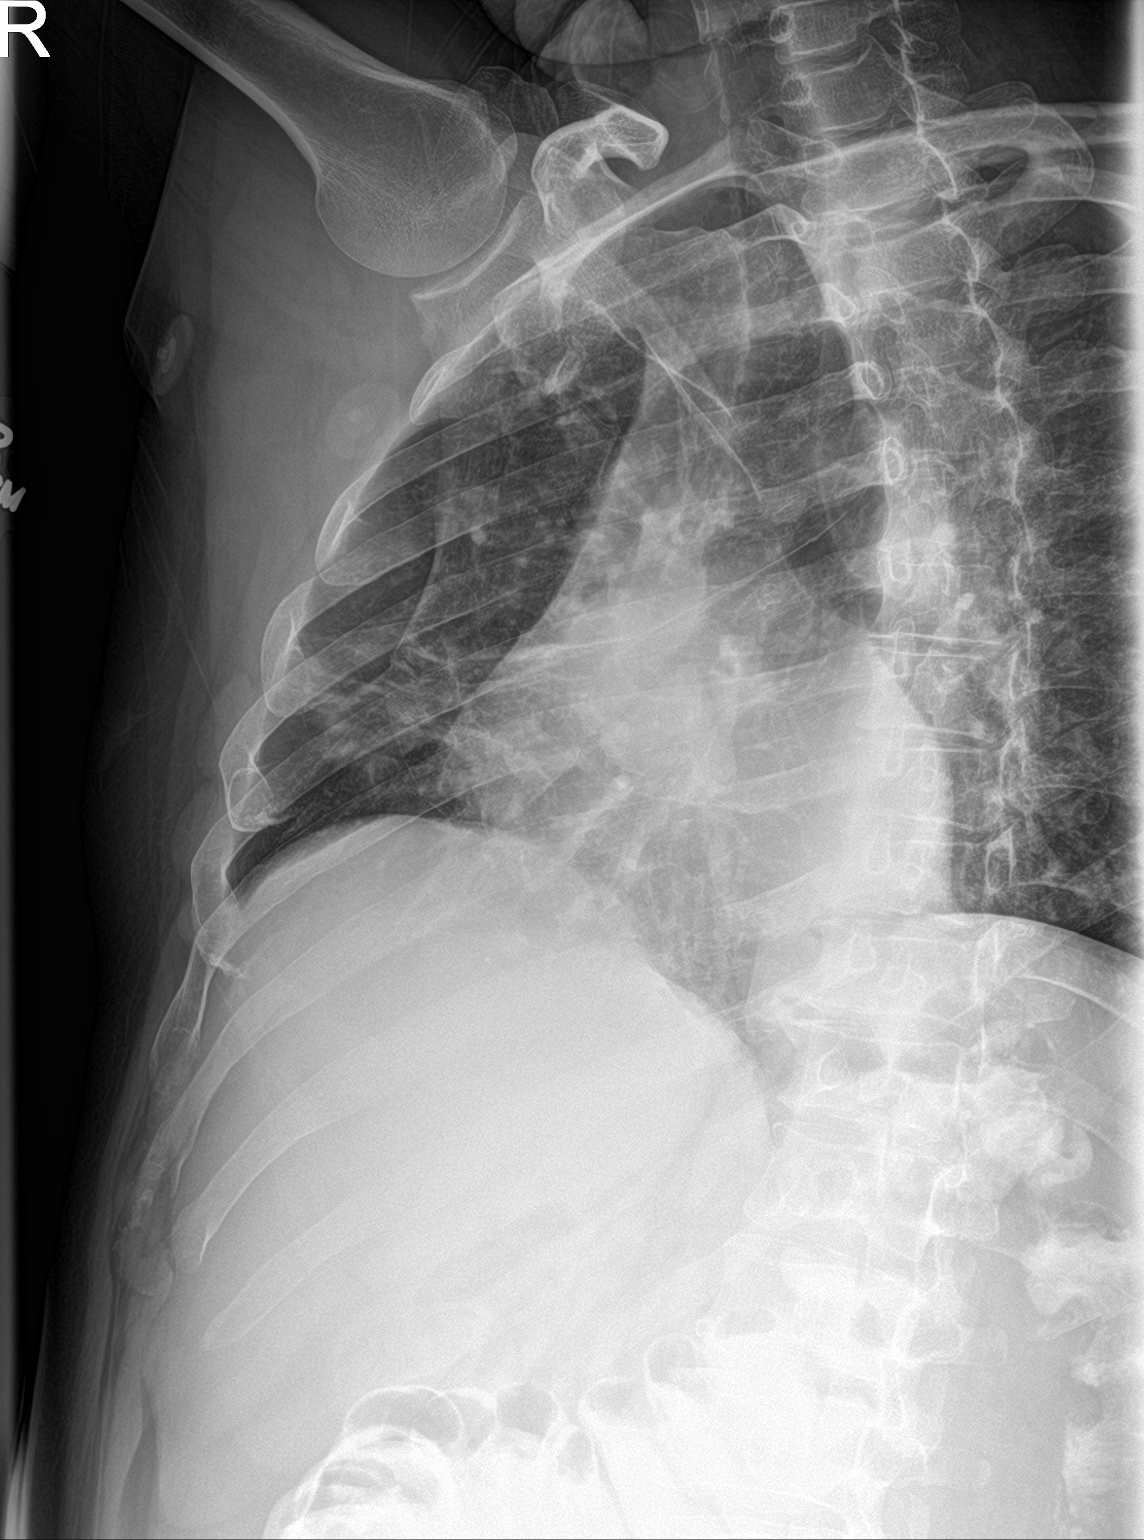

[3 of 3 positions shown; findings below may reference images not displayed]

FINDINGS: The ill-defined lucency in the posterior ninth rib with a
nondisplaced fractures not well visualized on this exam. There is no
evidence of pneumothorax or pleural effusion. Again noted is a right
infrahilar spiculated nodular opacity with linear scarring and
ill-defined opacity in the right lower lung. There is also a small
nodular opacity seen within right upper lung and left upper lung as
seen the prior exam.
IMPRESSION: The patient's known posterior right ninth rib fracture is not well
visualized on this exam.

Multiple bilateral nodular opacities unchanged from prior exam.

## 2020-09-03 NOTE — ED Triage Notes (Signed)
Patient reports right lateral ribcage pain injured from a fall 5 days ago , respirations unlabored , pain increases with movement and changing positions.

## 2020-09-04 ENCOUNTER — Telehealth: Payer: Self-pay | Admitting: Oncology

## 2020-09-04 NOTE — Telephone Encounter (Signed)
A msg has been sent to Bob Ewing, NPand Bob Ewing to see if the pt needs a bx before being scheduled at the cancer center. Pt has been called and informed that he will need to have a bx so that we can get him to the appropriate provider. Pt voiced understanding.

## 2020-09-04 NOTE — ED Notes (Signed)
Pt leaving AMA, pt stated he's contacting doctor in morning.

## 2020-09-04 NOTE — Telephone Encounter (Signed)
Call placed to Central Scheduling to follow up on appt for CT biopsy. I left a message and left my contact information for a return call.

## 2020-09-07 ENCOUNTER — Encounter (HOSPITAL_COMMUNITY): Payer: Self-pay | Admitting: Radiology

## 2020-09-07 NOTE — Progress Notes (Signed)
Triston Skare Male, 50 y.o., 1971-05-28  MRN:  [id number] Phone:  [phone] (M) Preferred Language: Arabic      PCP:  Antonietta Jewel, MD Coverage:  [place]            RE: CT Biopsy Received: Today Suttle, Rosanne Ashing, MD  Magrinat, Virgie Dad, MD; Garth Bigness D  Thanks, Dr. Jana Hakim.   Lillymae Duet - approved for CT guided left renal mass biopsy. Please have ultrasound available for use in CT as adjunct.    Thanks,   Dylan        Previous Messages   ----- Message -----  From: Chauncey Cruel, MD  Sent: 09/07/2020  7:50 AM EST  To: Suzette Battiest, MD  Subject: RE: CT Biopsy                   Either will give [place] the information we need so it would be whichever biopsy would be less risky for the patient   Thank you!   Gus Magrinat  ----- Message -----  From: Suzette Battiest, MD  Sent: 09/03/2020 11:20 AM EST  To: Lennox Grumbles, MD  Subject: RE: CT Biopsy                   Dr. Jana Hakim,   Would you mind clarifying if you want a pulmonary met biopsy or renal mass biopsy?   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

## 2020-09-10 ENCOUNTER — Other Ambulatory Visit: Payer: Self-pay | Admitting: Radiology

## 2020-09-11 ENCOUNTER — Other Ambulatory Visit: Payer: Self-pay | Admitting: Student

## 2020-09-14 ENCOUNTER — Ambulatory Visit (HOSPITAL_COMMUNITY)
Admission: RE | Admit: 2020-09-14 | Discharge: 2020-09-14 | Disposition: A | Discharge status: Home / Self Care | Payer: 59 | Source: Ambulatory Visit | Attending: Oncology | Admitting: Oncology

## 2020-09-14 ENCOUNTER — Encounter (HOSPITAL_COMMUNITY): Payer: Self-pay

## 2020-09-14 ENCOUNTER — Other Ambulatory Visit: Payer: Self-pay

## 2020-09-14 DIAGNOSIS — I10 Essential (primary) hypertension: Secondary | ICD-10-CM | POA: Insufficient documentation

## 2020-09-14 DIAGNOSIS — C649 Malignant neoplasm of unspecified kidney, except renal pelvis: Secondary | ICD-10-CM | POA: Insufficient documentation

## 2020-09-14 DIAGNOSIS — Z79899 Other long term (current) drug therapy: Secondary | ICD-10-CM | POA: Insufficient documentation

## 2020-09-14 DIAGNOSIS — C778 Secondary and unspecified malignant neoplasm of lymph nodes of multiple regions: Secondary | ICD-10-CM | POA: Diagnosis not present

## 2020-09-14 DIAGNOSIS — F17210 Nicotine dependence, cigarettes, uncomplicated: Secondary | ICD-10-CM | POA: Diagnosis not present

## 2020-09-14 HISTORY — DX: Essential (primary) hypertension: I10

## 2020-09-14 LAB — CBC
HCT: 39 % (ref 39.0–52.0)
Hemoglobin: 13 g/dL (ref 13.0–17.0)
MCH: 30.1 pg (ref 26.0–34.0)
MCHC: 33.3 g/dL (ref 30.0–36.0)
MCV: 90.3 fL (ref 80.0–100.0)
Platelets: 289 10*3/uL (ref 150–400)
RBC: 4.32 MIL/uL (ref 4.22–5.81)
RDW: 13.8 % (ref 11.5–15.5)
WBC: 11 10*3/uL — ABNORMAL HIGH (ref 4.0–10.5)
nRBC: 0 % (ref 0.0–0.2)

## 2020-09-14 LAB — PROTIME-INR
INR: 1 (ref 0.8–1.2)
Prothrombin Time: 12.9 seconds (ref 11.4–15.2)

## 2020-09-14 IMAGING — CT CT BIOPSY CORE RENAL
1 of 2 series · 15 of 32 positions shown, 19 images · non-contrast
Comparison: none

INDICATION: 49-year-old male with left-sided renal mass

[Series 2: i-spiral 5.0 b40f · axial · 0.86mm/px · z∈[+1082,+1267]mm · 15 of 59 slices shown, 19 images]
[im 3/59  soft-tissue]
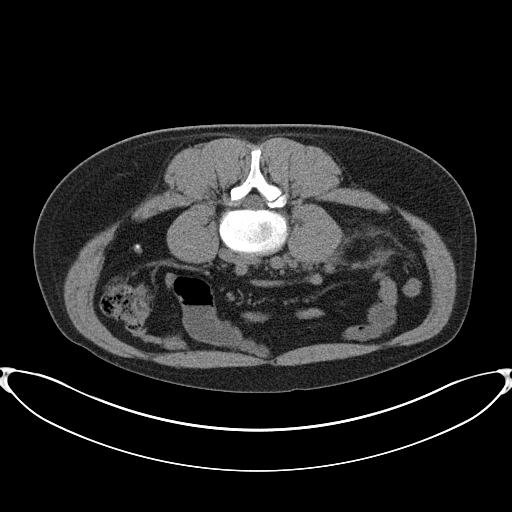
[im 3/59  bone]
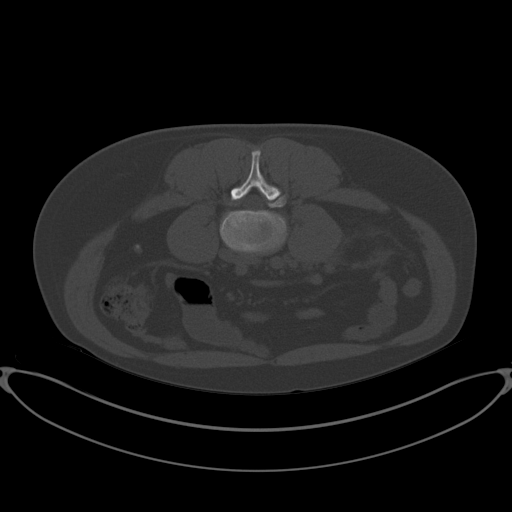
[im 7/59  soft-tissue]
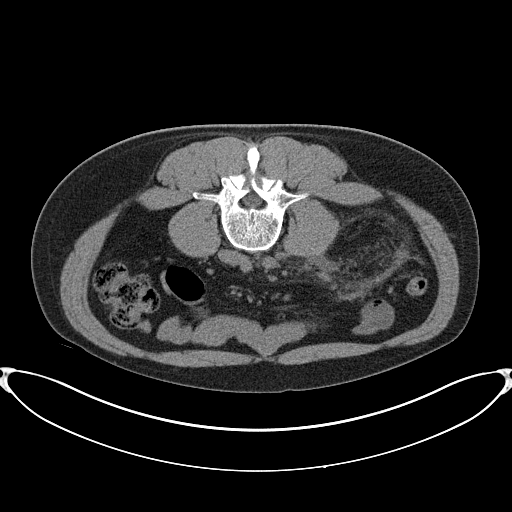
[im 12/59  soft-tissue]
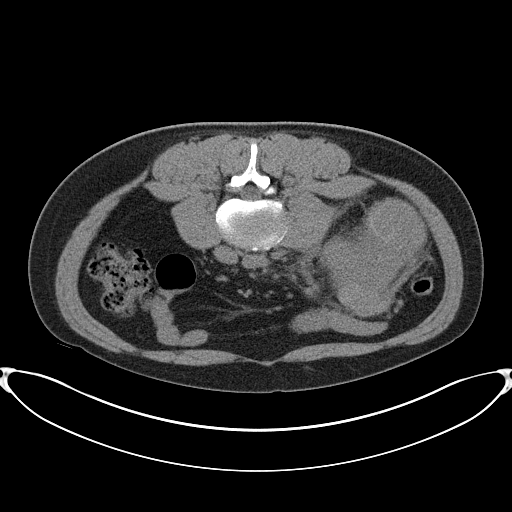
[im 16/59  soft-tissue]
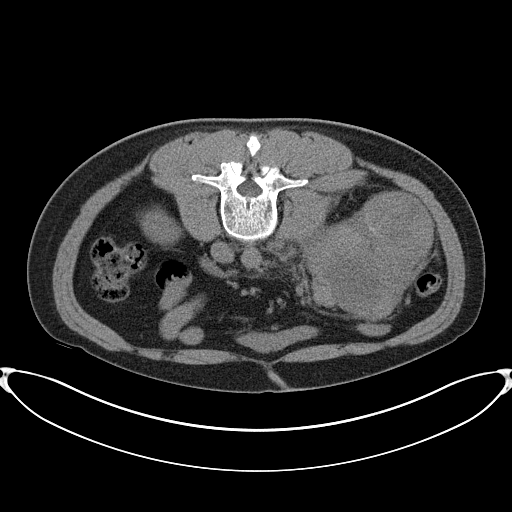
[im 21/59  soft-tissue]
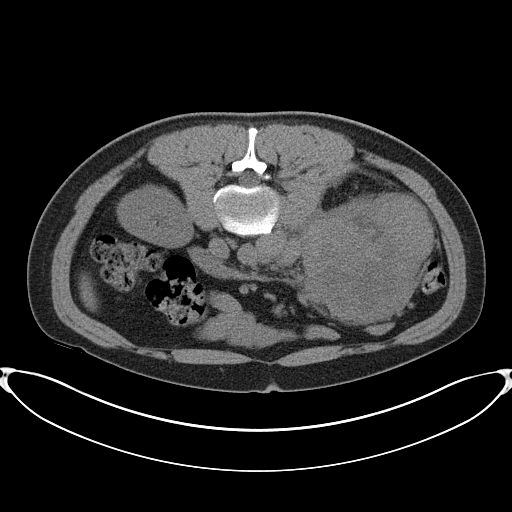
[im 25/59  soft-tissue]
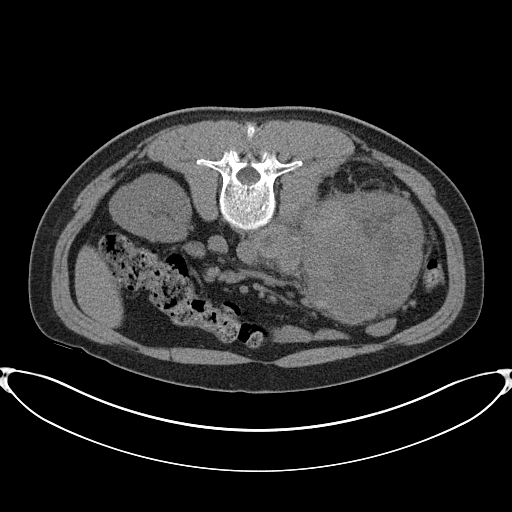
[im 30/59  soft-tissue]
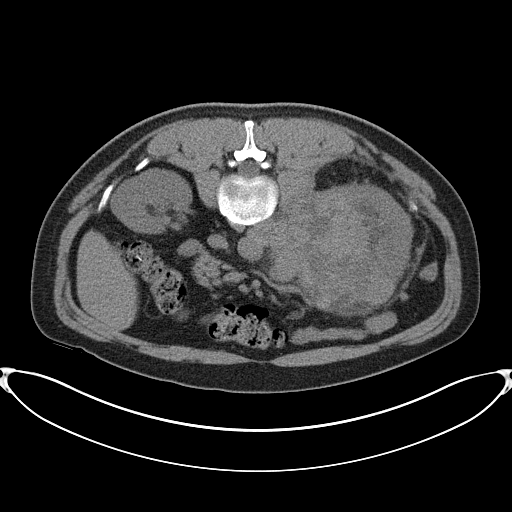
[im 34/59  soft-tissue]
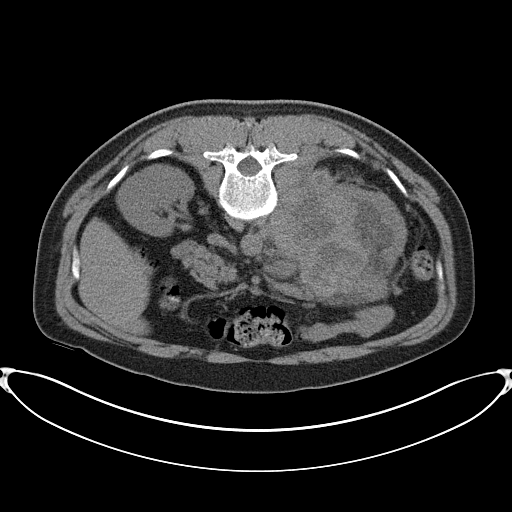
[im 38/59  soft-tissue]
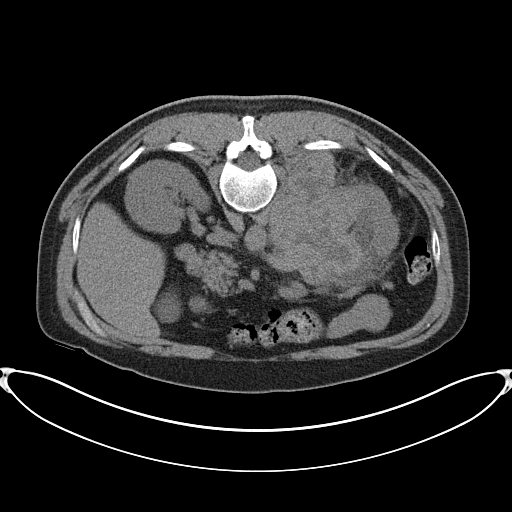
[im 38/59  bone]
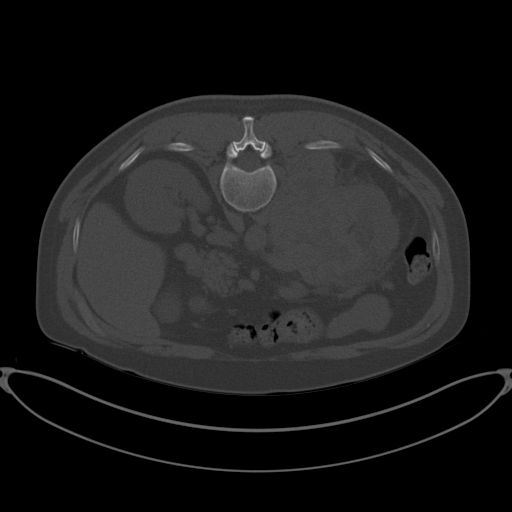
[im 43/59  soft-tissue]
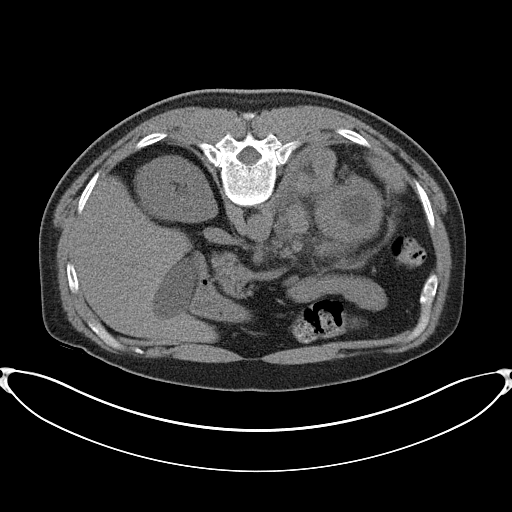
[im 47/59  soft-tissue]
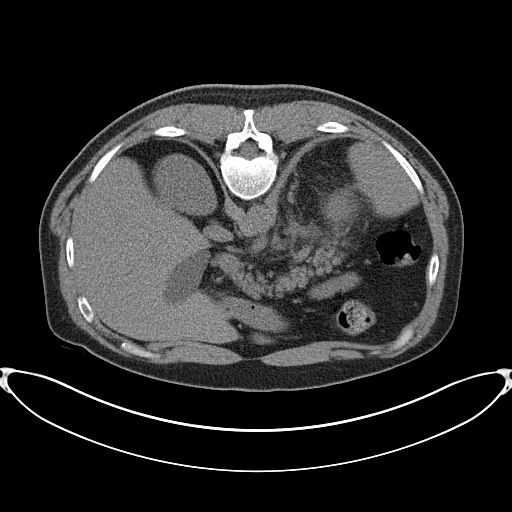
[im 50/59  lung]
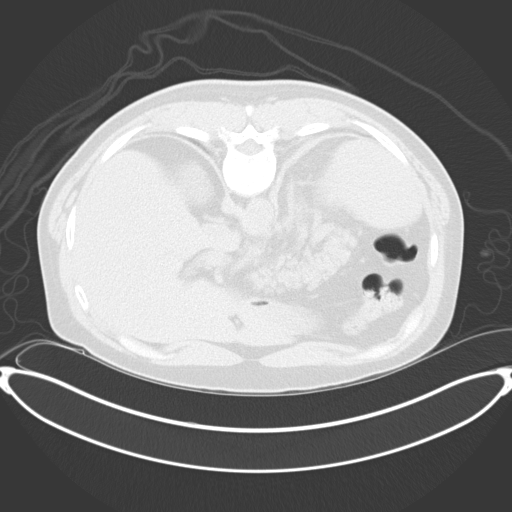
[im 52/59  soft-tissue]
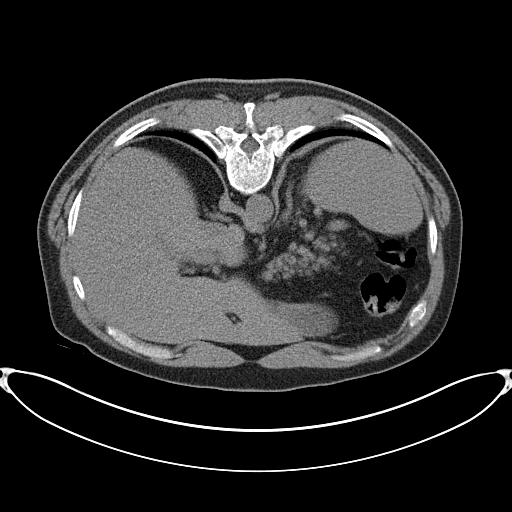
[im 52/59  lung]
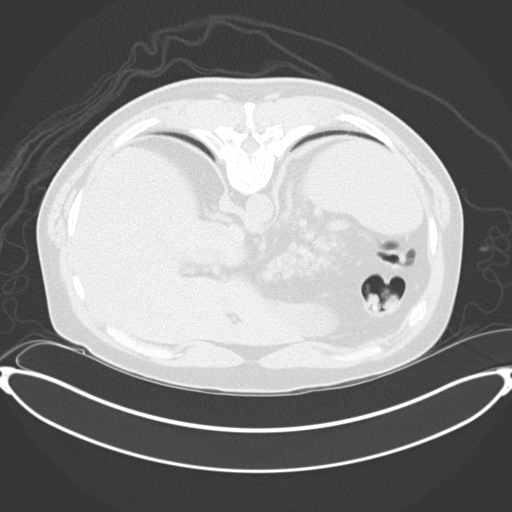
[im 54/59  lung]
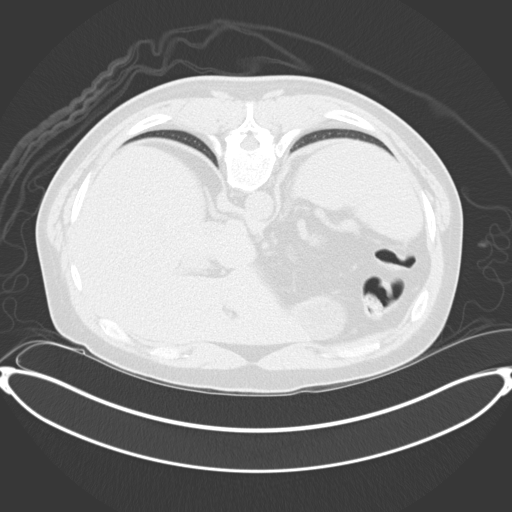
[im 56/59  soft-tissue]
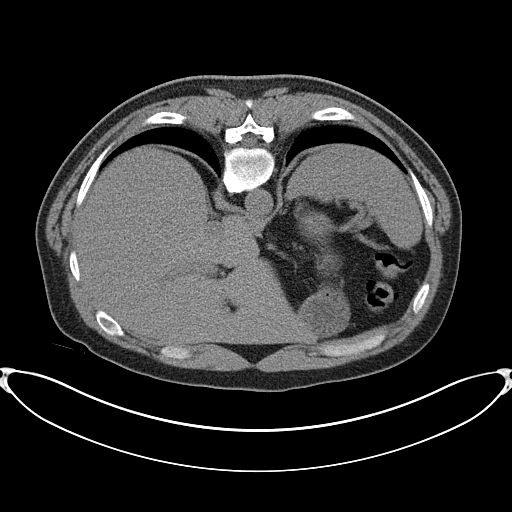
[im 56/59  lung]
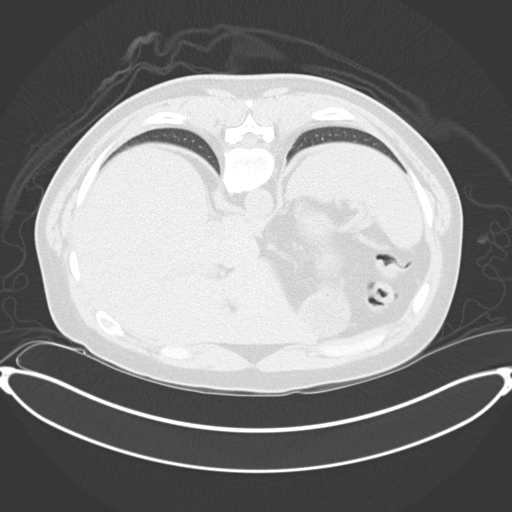

[15 of 32 positions shown; findings below may reference images not displayed]

EXAM:
CT-GUIDED BIOPSY LEFT RENAL MASS

MEDICATIONS:
None.

ANESTHESIA/SEDATION:
Moderate (conscious) sedation was employed during this procedure. A
total of Versed 1.5 mg and Fentanyl 50 mcg was administered
intravenously.

Moderate Sedation Time: 13 minutes. The patient's level of
consciousness and vital signs were monitored continuously by
radiology nursing throughout the procedure under my direct
supervision.

FLUOROSCOPY TIME:  CT

COMPLICATIONS:
None

PROCEDURE:
Informed written consent was obtained from the patient after a
thorough discussion of the procedural risks, benefits and
alternatives. All questions were addressed. Maximal Sterile Barrier
Technique was utilized including caps, mask, sterile gowns, sterile
gloves, sterile drape, hand hygiene and skin antiseptic. A timeout
was performed prior to the initiation of the procedure.

Patient is position prone position on the CT gantry table. Scout CT
was acquired for planning purposes.

The patient was then prepped and draped in the usual sterile
fashion. 1% lidocaine was used for local anesthesia. Using CT
guidance, guide needle was advanced into the medial aspect of the
soft tissue mass involving the retroperitoneum and the left kidney.
Multiple 18 gauge core biopsy were acquired.

Needle was removed and a final image was stored.

Patient tolerated the procedure well and remained hemodynamically
stable throughout.

No complications were encountered and no significant blood loss.
IMPRESSION: Status post CT-guided biopsy of left-sided renal mass.

## 2020-09-14 MED ORDER — MIDAZOLAM HCL 2 MG/2ML IJ SOLN
INTRAMUSCULAR | Status: AC
  Filled 2020-09-14: qty 4

## 2020-09-14 MED ORDER — FENTANYL CITRATE (PF) 100 MCG/2ML IJ SOLN
INTRAMUSCULAR | Status: AC
  Filled 2020-09-14: qty 4

## 2020-09-14 MED ORDER — SODIUM CHLORIDE 0.9 % IV SOLN: INTRAVENOUS | Status: DC

## 2020-09-14 MED ORDER — FENTANYL CITRATE (PF) 100 MCG/2ML IJ SOLN
INTRAMUSCULAR | Status: AC | PRN
  Administered 2020-09-14: 50 ug via INTRAVENOUS

## 2020-09-14 MED ORDER — MIDAZOLAM HCL 2 MG/2ML IJ SOLN
INTRAMUSCULAR | Status: AC | PRN
  Administered 2020-09-14: 1 mg via INTRAVENOUS
  Administered 2020-09-14: 0.5 mg via INTRAVENOUS

## 2020-09-14 MED ORDER — LIDOCAINE HCL 1 % IJ SOLN
INTRAMUSCULAR | Status: AC
  Filled 2020-09-14: qty 20

## 2020-09-14 NOTE — H&P (Signed)
Chief Complaint: Patient was seen in consultation today for left renal mass biopsy at the request of Magrinat,Gustav C  Referring Physician(s): Chauncey Cruel  Supervising Physician: Corrie Mckusick  Patient Status: [facility] - Out-pt  History of Present Illness: Bob Ewing is a 50 y.o. male   Hx Smoker; HTN Pt has had low back "kidney pain" for few weeks He has been taking Phentermine per MD for "fat burning and wt loss" He attributes initial kidney pain to this. Then after painting underside of his 18 wheeler truck- he developed right side pain and low back pain  Worsened and sought medical advice Hospital in [facility] discovered left renal mass per imaging He returned to [facility] and was referred to Cancer [facility].- Dr Jana Hakim  CT 09/01/20: IMPRESSION: 1.IMPRESSION: 1. 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 2. Extension in the left renal vein without IVC involvement. 3. Nondisplaced right ninth rib fracture, suspicious for pathologic fracture. 4. The clinical history describes suspicion of pulmonary embolism. Please note that the exam was not protocolled to evaluate for pulmonary embolism. No large or central embolism identified.  Scheduled now for left renal mass biopsy  Past Medical History:  Diagnosis Date  . Hypertension     History reviewed. No pertinent surgical history.  Allergies: Phentermine  Medications: Prior to Admission medications   Medication Sig Start Date End Date Taking? Authorizing Provider  atenolol (TENORMIN) 50 MG tablet Take 50 mg by mouth daily.   Yes [provider]  hydrochlorothiazide (HYDRODIURIL) 25 MG tablet Take 25 mg by mouth daily.   Yes [provider]  HYDROcodone-acetaminophen (NORCO/VICODIN) 5-325 MG tablet Take 1 tablet by mouth every 6 (six) hours as needed. Patient taking differently: Take 1 tablet by mouth every 6 (six) hours as needed  for moderate pain. 09/01/20  Yes Valarie Merino, MD     History reviewed. No pertinent family history.  Social History   Socioeconomic History  . Marital status: Legally Separated    Spouse name: Not on file  . Number of children: Not on file  . Years of education: Not on file  . Highest education level: Not on file  Occupational History  . Not on file  Tobacco Use  . Smoking status: Current Every Day Smoker  . Smokeless tobacco: Never Used  Vaping Use  . Vaping Use: Never used  Substance and Sexual Activity  . Alcohol use: Yes  . Drug use: Never  . Sexual activity: Not on file  Other Topics Concern  . Not on file  Social History Narrative  . Not on file   Social Determinants of Health   Financial Resource Strain: Not on file  Food Insecurity: Not on file  Transportation Needs: Not on file  Physical Activity: Not on file  Stress: Not on file  Social Connections: Not on file    Review of Systems: A 12 point ROS discussed and pertinent positives are indicated in the HPI above.  All other systems are negative.  Review of Systems  Constitutional: Negative for activity change, fatigue, fever and unexpected weight change.  Respiratory: Negative for cough and shortness of breath.   Cardiovascular: Negative for chest pain.  Gastrointestinal: Negative for abdominal pain and nausea.  Musculoskeletal: Positive for back pain.  Neurological: Negative for weakness.  Psychiatric/Behavioral: Negative for behavioral problems and confusion.    Vital Signs: BP (!) 149/83   Pulse 88   Temp 98.4 F (36.9 C) (Oral)  Resp 16   Ht 5\' 8"  (1.727 m)   Wt 196 lb (88.9 kg)   SpO2 99%   BMI 29.80 kg/m   Physical Exam Vitals reviewed.  HENT:     Mouth/Throat:     Mouth: Mucous membranes are moist.  Cardiovascular:     Rate and Rhythm: Normal rate and regular rhythm.     Heart sounds: Normal heart sounds.  Pulmonary:     Effort: Pulmonary effort is normal.     Breath sounds:  Normal breath sounds.  Abdominal:     Palpations: Abdomen is soft.     Tenderness: There is no abdominal tenderness.  Musculoskeletal:        General: Normal range of motion.  Skin:    General: Skin is warm.  Neurological:     Mental Status: He is alert and oriented to person, place, and time.  Psychiatric:        Behavior: Behavior normal.     Imaging: DG Chest 2 View  Result Date: 09/01/2020 CLINICAL DATA:  Malignancy workup. Per chart review patient reportedly went to outside ED where they were concern for renal cell carcinoma with pulmonary metastases. EXAM: CHEST - 2 VIEW COMPARISON:  None. FINDINGS: Right infrahilar prominence. Additionally, there is a 1 cm nodular opacity in the right upper lung and the left midlung. Linear opacities in the right lower lobe.No visible pneumothorax or pleural effusions. Please see outside chest CT for characterization of reported right rib fracture. IMPRESSION: 1. Multiple nodular opacities in the lungs. Recommend correlation with outside imaging or repeat chest CT to evaluate for metastases given the clinical history. 2. Please see outside chest CT for characterization of reported right rib fracture. Right basilar atelectasis. Findings discussed with Messick at 4:19 p.m via telephone. Electronically Signed   By: Margaretha Sheffield MD   On: 09/01/2020 16:26   DG Ribs Unilateral W/Chest Right  Result Date: 09/03/2020 CLINICAL DATA:  Fall injury on the right side EXAM: RIGHT RIBS AND CHEST - 3+ VIEW COMPARISON:  September 01, 2020 FINDINGS: The ill-defined lucency in the posterior ninth rib with a nondisplaced fractures not well visualized on this exam. There is no evidence of pneumothorax or pleural effusion. Again noted is a right infrahilar spiculated nodular opacity with linear scarring and ill-defined opacity in the right lower lung. There is also a small nodular opacity seen within right upper lung and left upper lung as seen the prior exam. IMPRESSION:  The patient's known posterior right ninth rib fracture is not well visualized on this exam. Multiple bilateral nodular opacities unchanged from prior exam. Electronically Signed   By: Prudencio Pair M.D.   On: 09/03/2020 22:24   CT CHEST ABDOMEN PELVIS W CONTRAST  Result Date: 09/01/2020 CLINICAL DATA:  Right-sided flank pain. Outside CT suspicious for renal cell carcinoma with pulmonary metastasis. EXAM: CT CHEST, ABDOMEN, AND PELVIS WITH CONTRAST TECHNIQUE: Multidetector CT imaging of the chest, abdomen and pelvis was performed following the standard protocol during bolus administration of intravenous contrast. CONTRAST:  13mL OMNIPAQUE IOHEXOL 300 MG/ML  SOLN COMPARISON:  Plain film chest of earlier today. FINDINGS: CT CHEST FINDINGS Cardiovascular: Normal aortic caliber. Normal heart size, without pericardial effusion. No central pulmonary embolism, on this non-dedicated study. Mediastinum/Nodes: Low right mediastinal node of 1.3 cm on 29/3. Right infrahilar nodal mass of 3.3 by 2.3 cm on 30/3. Right hilar adenopathy at 2.5 cm on 27/3. Left lower mediastinal nodal mass of 2.1 cm on 29/3. Lungs/Pleura: No pleural fluid.  Innumerable bilateral pulmonary nodules consistent with metastasis. Index right lower lobe pulmonary nodule of 1.0 cm on 108/5. Left lower lobe index 1.2 cm nodule on 105/5. Index left upper lobe pulmonary nodule of 1.2 cm on 45/5. Musculoskeletal: Posterior left humeral head lytic lesion of 1.4 cm on 05/03. Nondisplaced fracture of the right ninth rib with suggestion of underlying lucency on 42/3. CT ABDOMEN PELVIS FINDINGS Hepatobiliary: Normal liver. Normal gallbladder, without biliary ductal dilatation. Pancreas: Normal, without mass or ductal dilatation. Spleen: Normal in size, without focal abnormality. Adrenals/Urinary Tract: Normal adrenal glands. Normal right kidney. Infiltrative mass or masses replace the majority of the inter and lower pole left kidney. An exophytic somewhat more  well-circumscribed mass or component off the lower pole measures 5.9 x 5.1 cm on 79/3. The more diffuse infiltrative component extends extrarenally medially to the level of the vertebral body including at 9.5 x 8.9 cm on 65/3. Decreased left renal function, as evidenced by absent contrast excretion. Normal urinary bladder. Stomach/Bowel: Normal stomach, without wall thickening. Normal colon, appendix, and terminal ileum. Normal small bowel. Vascular/Lymphatic: Aortic atherosclerosis. Tumor extending left renal vein including on 62/3. Abdominal retroperitoneal nodal metastasis, including index 1.7 by 2.4 cm left paraaortic node on 73/3. No pelvic sidewall adenopathy. Reproductive: Normal prostate.  Left-sided varicocele on 132/3. Other: No significant free fluid. Musculoskeletal: No acute osseous abnormality. IMPRESSION: 1. 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 2. Extension in the left renal vein without IVC involvement. 3. Nondisplaced right ninth rib fracture, suspicious for pathologic fracture. 4. The clinical history describes suspicion of pulmonary embolism. Please note that the exam was not protocolled to evaluate for pulmonary embolism. No large or central embolism identified. Electronically Signed   By: Abigail Miyamoto M.D.   On: 09/01/2020 18:51    Labs:  CBC: Recent Labs    05/03/20 0244 09/01/20 1359 09/03/20 2209  WBC 8.4 9.3 9.5  HGB 12.0* 13.1 13.8  HCT 37.3* 41.1 43.3  PLT 272 308 344    COAGS: No results for input(s): INR, APTT in the last 8760 hours.  BMP: Recent Labs    05/03/20 0244 09/01/20 1359 09/03/20 2209  NA 138 135 137  K 3.9 4.0 4.2  CL 104 99 99  CO2 26 25 27   GLUCOSE 132* 163* 96  BUN 13 16 22*  CALCIUM 9.5 9.9 10.4*  CREATININE 1.35* 1.21 1.24  GFRNONAA >60 >60 >60  GFRAA >60  --   --     LIVER FUNCTION TESTS: No results for input(s): BILITOT, AST, ALT, ALKPHOS, PROT,  ALBUMIN in the last 8760 hours.  TUMOR MARKERS: No results for input(s): AFPTM, CEA, CA199, CHROMGRNA in the last 8760 hours.  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  All of the questions were answered and there is agreement to proceed. Consent signed and in chart.   Thank you for this interesting consult.  I greatly enjoyed meeting Elester Apodaca and look forward to participating in their care.  A copy of this report was sent to the requesting provider on this date.  Electronically Signed: Lavonia Drafts,  PA-C 09/14/2020, 7:31 AM   I spent a total of  30 Minutes   in face to face in clinical consultation, greater than 50% of which was counseling/coordinating care for left renal mass bx

## 2020-09-14 NOTE — Discharge Instructions (Addendum)
Percutaneous Kidney Biopsy, Care After This sheet gives you information about how to care for yourself after your procedure. Your health care provider may also give you more specific instructions. If you have problems or questions, contact your health care provider. What can I expect after the procedure? After the procedure, it is common to have:  Pain or soreness near the biopsy site.  Pink or cloudy urine for 24 hours after the procedure. This is normal. Follow these instructions at home: Activity  Return to your normal activities as told by your health care provider. Ask your health care provider what activities are safe for you.  If you were given a sedative during the procedure, it can affect you for several hours. Do not drive or operate machinery until your health care provider says that it is safe.  Do not lift anything that is heavier than 10 lb (4.5 kg), or the limit that you are told, until your health care provider says that it is safe.  Avoid activities that take a lot of effort until your health care provider approves. Most people will have to wait 2 weeks before returning to activities such as exercise or sex. General instructions  Take over-the-counter and prescription medicines only as told by your health care provider.  Follow instructions from your health care provider about eating or drinking restrictions.  Check your biopsy site every day for signs of infection. Check for: ? More redness, swelling, or pain. ? Fluid or blood. ? Warmth. ? Pus or a bad smell.  Keep all follow-up visits as told by your health care provider. This is important.   Contact a health care provider if:  You have more redness, swelling, or pain around your biopsy site.  You have fluid or blood coming from your biopsy site.  Your biopsy site feels warm to the touch.  You have pus or a bad smell coming from your biopsy site.  You have blood in your urine more than 24 hours after your  procedure. Get help right away if:  Your urine is dark red or brown.  You have a fever.  You are not able to urinate.  You feel burning when you urinate.  You feel dizzy or light-headed.  You have severe pain in your abdomen or side. Summary  After the procedure, it is common to have pain or soreness at the biopsy site and pink or cloudy urine for the first 24 hours.  Check your biopsy site each day for signs of infection, such as more redness, swelling, or pain; fluid, blood, pus or a bad smell coming from the biopsy site; or the biopsy site feeling warm to the touch.  Return to your normal activities as told by your health care provider. This information is not intended to replace advice given to you by your health care provider. Make sure you discuss any questions you have with your health care provider. Document Revised: 10/11/2019 Document Reviewed: 10/11/2019 Elsevier Patient Education  2021 Elsevier Inc. Moderate Conscious Sedation, Adult Sedation is the use of medicines to promote relaxation and to relieve discomfort and anxiety. Moderate conscious sedation is a type of sedation. Under moderate conscious sedation, you are less alert than normal, but you are still able to respond to instructions, touch, or both. Moderate conscious sedation is used during short medical and dental procedures. It is milder than deep sedation, which is a type of sedation under which you cannot be easily woken up. It is also milder than   general anesthesia, which is the use of medicines to make you unconscious. Moderate conscious sedation allows you to return to your regular activities sooner. Tell a health care provider about:  Any allergies you have.  All medicines you are taking, including vitamins, herbs, eye drops, creams, and over-the-counter medicines.  Any use of steroids. This includes steroids taken by mouth or as a cream.  Any problems you or family members have had with sedatives and  anesthetic medicines.  Any blood disorders you have.  Any surgeries you have had.  Any medical conditions you have, such as sleep apnea.  Whether you are pregnant or may be pregnant.  Any use of cigarettes, alcohol, marijuana, or drugs. What are the risks? Generally, this is a safe procedure. However, problems may occur, including:  Getting too much medicine (oversedation).  Nausea.  Allergic reaction to medicines.  Trouble breathing. If this happens, a breathing tube may be used. It will be removed when you are awake and breathing on your own.  Heart trouble.  Lung trouble.  Confusion that gets better with time (emergence delirium). What happens before the procedure? Staying hydrated Follow instructions from your health care provider about hydration, which may include:  Up to 2 hours before the procedure - you may continue to drink clear liquids, such as water, clear fruit juice, black coffee, and plain tea. Eating and drinking restrictions Follow instructions from your health care provider about eating and drinking, which may include:  8 hours before the procedure - stop eating heavy meals or foods, such as meat, fried foods, or fatty foods.  6 hours before the procedure - stop eating light meals or foods, such as toast or cereal.  6 hours before the procedure - stop drinking milk or drinks that contain milk.  2 hours before the procedure - stop drinking clear liquids. Medicines Ask your health care provider about:  Changing or stopping your regular medicines. This is especially important if you are taking diabetes medicines or blood thinners.  Taking medicines such as aspirin and ibuprofen. These medicines can thin your blood. Do not take these medicines unless your health care provider tells you to take them.  Taking over-the-counter medicines, vitamins, herbs, and supplements. Tests and exams  You will have a physical exam.  You may have blood tests done to  show how well: ? Your kidneys and liver work. ? Your blood clots. General instructions  Plan to have a responsible adult take you home from the hospital or clinic.  If you will be going home right after the procedure, plan to have a responsible adult care for you for the time you are told. This is important. What happens during the procedure?  You will be given the sedative. The sedative may be given: ? As a pill that you will swallow. It can also be inserted into the rectum. ? As a spray through the nose. ? As an injection into the muscle. ? As an injection into the vein through an IV.  You may be given oxygen as needed.  Your breathing, heart rate, and blood pressure will be monitored during the procedure.  The medical or dental procedure will be done. The procedure may vary among health care providers and hospitals.   What happens after the procedure?  Your blood pressure, heart rate, breathing rate, and blood oxygen level will be monitored until you leave the hospital or clinic.  You will get fluids through your IV if needed.  Do not drive   or operate machinery until your health care provider says that it is safe. Summary  Sedation is the use of medicines to promote relaxation and to relieve discomfort and anxiety. Moderate conscious sedation is a type of sedation that is used during short medical and dental procedures.  Tell the health care provider about any medical conditions that you have and about all the medicines that you are taking.  You will be given the sedative as a pill, a spray through the nose, an injection into the muscle, or an injection into the vein through an IV. Vital signs are monitored during the sedation.  Moderate conscious sedation allows you to return to your regular activities sooner. This information is not intended to replace advice given to you by your health care provider. Make sure you discuss any questions you have with your health care  provider. Document Revised: 11/15/2019 Document Reviewed: 06/13/2019 Elsevier Patient Education  2021 Elsevier Inc.  

## 2020-09-14 NOTE — Procedures (Addendum)
Interventional Radiology Procedure Note  Procedure: CT guided biopsy of kidney mass, left Complications: None EBL: None Recommendations: - Bedrest 1 hours.   - Routine wound care - Follow up pathology - Advance diet   Signed,  Corrie Mckusick, DO

## 2020-09-14 NOTE — Progress Notes (Signed)
Pt ambulated  And voiding without difficulty or bleeding.   Discharged home with friend who will drive and stay with pt x 24 hrs

## 2020-09-15 LAB — SURGICAL PATHOLOGY

## 2020-09-17 ENCOUNTER — Telehealth: Payer: Self-pay | Admitting: Oncology

## 2020-09-17 NOTE — Telephone Encounter (Signed)
Mr. Plotts has been rescheduled to see Dr. Alen Blew on 2/21 at 830am per staff msg from MD. Aware to arrive 15 minutes early.

## 2020-09-17 NOTE — Telephone Encounter (Signed)
Received a new pt referral from the hospital for metastatic kidney cancer. Mr. Rigor has been cld and scheduled to see Dr. Alen Blew on 3/2 at 11am. Pt aware to arrive 20 minutes early.

## 2020-09-21 ENCOUNTER — Inpatient Hospital Stay: Payer: 59 | Attending: Oncology | Admitting: Oncology

## 2020-09-21 ENCOUNTER — Other Ambulatory Visit: Payer: Self-pay

## 2020-09-21 VITALS — BP 133/76 | HR 85 | Temp 97.0°F | Resp 20 | Ht 68.0 in | Wt 200.0 lb

## 2020-09-21 DIAGNOSIS — C642 Malignant neoplasm of left kidney, except renal pelvis: Secondary | ICD-10-CM | POA: Insufficient documentation

## 2020-09-21 DIAGNOSIS — Z7189 Other specified counseling: Secondary | ICD-10-CM | POA: Diagnosis not present

## 2020-09-21 DIAGNOSIS — Z5112 Encounter for antineoplastic immunotherapy: Secondary | ICD-10-CM | POA: Insufficient documentation

## 2020-09-21 MED ORDER — PROCHLORPERAZINE MALEATE 10 MG PO TABS: 10.0000 mg | ORAL_TABLET | Freq: Four times a day (QID) | ORAL | 0 refills | Status: DC | PRN

## 2020-09-21 NOTE — Progress Notes (Signed)
Reason for the request:    Kidney cancer  HPI: I was asked by Dr. Francia Greaves to evaluate Bob Ewing for kidney mass.  He is a 50 year old man with a history of hypertension who was evaluated in the emergency department on February 1 for complaints of flank pain.  He was in [place] driving and experienced sudden onset of right-sided chest pain and flank pain and urgently was evaluated at that time.  Imaging studies showed a left renal mass.  He had was seen in the emergency department a locally on February 1 and his work-up included a CT scan chest abdomen pelvis which showed a 5.9 x 5.1 cm exophytic lesion of the right kidney.  There is more diffuse infiltrative component extends external ring only to the level of the vertebral body.  He had abdominal retroperitoneal nodal metastasis as well as innumerable bilateral pulmonary nodules.  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 He also had nondisplaced rib fracture. He was prescribed hydrocodone for his pain.  Clinically, he reports feeling reasonably well.  He does report right-sided chest wall pain related to a fractured rib but he attributes happen while working on his truck.  He denies any headaches, blurry vision or neurological deficits.   He does not report any headaches, blurry vision, syncope or seizures. Does not report any fevers, chills or sweats.  Does not report any cough, wheezing or hemoptysis.  Does not report any chest pain, palpitation, orthopnea or leg edema.  Does not report any nausea, vomiting or abdominal pain.  Does not report any constipation or diarrhea.  Does not report any skeletal complaints.    Does not report frequency, urgency or hematuria.  Does not report any skin rashes or lesions. Does not report any heat or cold intolerance.  Does not report any lymphadenopathy or petechiae.  Does not report any anxiety or  depression.  Remaining review of systems is negative.    Past Medical History:  Diagnosis Date  . Hypertension   :  No past surgical history on file.:   Current Outpatient Medications:  .  atenolol (TENORMIN) 50 MG tablet, Take 50 mg by mouth daily., Disp: , Rfl:  .  hydrochlorothiazide (HYDRODIURIL) 25 MG tablet, Take 25 mg by mouth daily., Disp: , Rfl:  .  HYDROcodone-acetaminophen (NORCO/VICODIN) 5-325 MG tablet, Take 1 tablet by mouth every 6 (six) hours as needed. (Patient taking differently: Take 1 tablet by mouth every 6 (six) hours as needed for moderate pain.), Disp: 15 tablet, Rfl: 0:  Allergies  Allergen Reactions  . Phentermine     Kidney pain  :  No family history on file.:  Social History   Socioeconomic History  . Marital status: Legally Separated    Spouse name: Not on file  . Number of children: Not on file  . Years of education: Not on file  . Highest education level: Not on file  Occupational History  . Not on file  Tobacco Use  . Smoking status: Current Every Day Smoker  . Smokeless tobacco: Never Used  Vaping Use  . Vaping Use: Never used  Substance and Sexual Activity  . Alcohol use: Yes  . Drug use: Never  . Sexual activity: Not on file  Other Topics Concern  . Not on file  Social History Narrative  . Not on file   Social Determinants of Health   Financial Resource  Strain: Not on file  Food Insecurity: Not on file  Transportation Needs: Not on file  Physical Activity: Not on file  Stress: Not on file  Social Connections: Not on file  Intimate Partner Violence: Not on file  :  Pertinent items are noted in HPI.  Exam: Blood pressure 133/76, pulse 85, temperature (!) 97 F (36.1 C), temperature source Tympanic, resp. rate 20, height 5\' 8"  (1.727 m), weight 200 lb (90.7 kg), SpO2 100 %.  ECOG 0  General appearance: alert and cooperative appeared without distress. Head: atraumatic without any abnormalities. Eyes:  conjunctivae/corneas clear. PERRL.  Sclera anicteric. Throat: lips, mucosa, and tongue normal; without oral thrush or ulcers. Resp: clear to auscultation bilaterally without rhonchi, wheezes or dullness to percussion. Cardio: regular rate and rhythm, S1, S2 normal, no murmur, click, rub or gallop GI: soft, non-tender; bowel sounds normal; no masses,  no organomegaly Skin: Skin color, texture, turgor normal. No rashes or lesions Lymph nodes: Cervical, supraclavicular, and axillary nodes normal. Neurologic: Grossly normal without any motor, sensory or deep tendon reflexes. Musculoskeletal: No joint deformity or effusion.      DG Chest 2 View  Result Date: 09/01/2020 CLINICAL DATA:  Malignancy workup. Per chart review patient reportedly went to outside ED where they were concern for renal cell carcino so ma with pulmonary metastases. EXAM: CHEST - 2 VIEW COMPARISON:  None. FINDINGS: Right infrahilar prominence. Additionally, there is a 1 cm nodular opacity in the right upper lung and the left midlung. Linear opacities in the right lower lobe.No visible pneumothorax or pleural effusions. Please see outside chest CT for characterization of reported right rib fracture. IMPRESSION: 1. Multiple nodular opacities in the lungs. Recommend correlation with outside imaging or repeat chest CT to evaluate for metastases given the clinical history. 2. Please see outside chest CT for characterization of reported right rib fracture. Right basilar atelectasis. Findings discussed with Messick at 4:19 p.m via telephone. Electronically Signed   By: Margaretha Sheffield MD   On: 09/01/2020 16:26   DG Ribs Unilateral W/Chest Right  Result Date: 09/03/2020 CLINICAL DATA:  Fall injury on the right side EXAM: RIGHT RIBS AND CHEST - 3+ VIEW COMPARISON:  September 01, 2020 FINDINGS: The ill-defined lucency in the posterior ninth rib with a nondisplaced fractures not well visualized on this exam. There is no evidence of pneumothorax  or pleural effusion. Again noted is a right infrahilar spiculated nodular opacity with linear scarring and ill-defined opacity in the right lower lung. There is also a small nodular opacity seen within right upper lung and left upper lung as seen the prior exam. IMPRESSION: The patient's known posterior right ninth rib fracture is not well visualized on this exam. Multiple bilateral nodular opacities unchanged from prior exam. Electronically Signed   By: Prudencio Pair M.D.   On: 09/03/2020 22:24   CT CHEST ABDOMEN PELVIS W CONTRAST  Result Date: 09/01/2020 CLINICAL DATA:  Right-sided flank pain. Outside CT suspicious for renal cell carcinoma with pulmonary metastasis. EXAM: CT CHEST, ABDOMEN, AND PELVIS WITH CONTRAST TECHNIQUE: Multidetector CT imaging of the chest, abdomen and pelvis was performed following the standard protocol during bolus administration of intravenous contrast. CONTRAST:  139mL OMNIPAQUE IOHEXOL 300 MG/ML  SOLN COMPARISON:  Plain film chest of earlier today. FINDINGS: CT CHEST FINDINGS Cardiovascular: Normal aortic caliber. Normal heart size, without pericardial effusion. No central pulmonary embolism, on this non-dedicated study. Mediastinum/Nodes: Low right mediastinal node of 1.3 cm on 29/3. Right infrahilar nodal mass of 3.3  by 2.3 cm on 30/3. Right hilar adenopathy at 2.5 cm on 27/3. Left lower mediastinal nodal mass of 2.1 cm on 29/3. Lungs/Pleura: No pleural fluid. Innumerable bilateral pulmonary nodules consistent with metastasis. Index right lower lobe pulmonary nodule of 1.0 cm on 108/5. Left lower lobe index 1.2 cm nodule on 105/5. Index left upper lobe pulmonary nodule of 1.2 cm on 45/5. Musculoskeletal: Posterior left humeral head lytic lesion of 1.4 cm on 05/03. Nondisplaced fracture of the right ninth rib with suggestion of underlying lucency on 42/3. CT ABDOMEN PELVIS FINDINGS Hepatobiliary: Normal liver. Normal gallbladder, without biliary ductal dilatation. Pancreas: Normal,  without mass or ductal dilatation. Spleen: Normal in size, without focal abnormality. Adrenals/Urinary Tract: Normal adrenal glands. Normal right kidney. Infiltrative mass or masses replace the majority of the inter and lower pole left kidney. An exophytic somewhat more well-circumscribed mass or component off the lower pole measures 5.9 x 5.1 cm on 79/3. The more diffuse infiltrative component extends extrarenally medially to the level of the vertebral body including at 9.5 x 8.9 cm on 65/3. Decreased left renal function, as evidenced by absent contrast excretion. Normal urinary bladder. Stomach/Bowel: Normal stomach, without wall thickening. Normal colon, appendix, and terminal ileum. Normal small bowel. Vascular/Lymphatic: Aortic atherosclerosis. Tumor extending left renal vein including on 62/3. Abdominal retroperitoneal nodal metastasis, including index 1.7 by 2.4 cm left paraaortic node on 73/3. No pelvic sidewall adenopathy. Reproductive: Normal prostate.  Left-sided varicocele on 132/3. Other: No significant free fluid. Musculoskeletal: No acute osseous abnormality. IMPRESSION: 1. 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 2. Extension in the left renal vein without IVC involvement. 3. Nondisplaced right ninth rib fracture, suspicious for pathologic fracture. 4. The clinical history describes suspicion of pulmonary embolism. Please note that the exam was not protocolled to evaluate for pulmonary embolism. No large or central embolism identified. Electronically Signed   By: Abigail Miyamoto M.D.   On: 09/01/2020 18:51    Assessment and Plan:    50 year old man with:  1. Renal cell carcinoma arising from the left kidney presented with stage IV disease including abdominal adenopathy and pulmonary metastasis. Biopsy on February 14 confirmed the presence of clear cell renal cell carcinoma with rhabdoid features. IMDC risk  stratification is intermediate.  The natural course of this disease and treatment options were discussed at this time.  Given his stage IV status and increased volume of disease outside of his kidney, I recommended proceeding with systemic therapy and deferring radical nephrectomy unless he develops worsening symptoms or an excellent systemic response.  Systemic treatment options including oral targeted therapy, immunotherapy therapy single agent or in combination or combination oral targeted therapy with immunotherapy.  Given his intermediate risk category the option includes Pembrolizumab with axitinib versus cabozantinib with nivolumab versus ipilimumab and nivolumab.  Risks and benefits of all these options were discussed at this time.  Given his young age and excellent performance status and limited volume of disease, I am in favor of proceeding with ipilimumab and nivolumab.  I feel that an adequate response rate is important but not as critical as long-term disease control given his age.  Risks and benefits of ipilimumab and nivolumab specifically were discussed.  Immune mediated complications that include hypothyroidism, dermatitis, pneumonitis, colitis and hypophysitis were reiterated.  After discussion today is agreeable to proceed and we will arrange for education class in the near future.  He will receive ipilimumab at 1 mg/kg with nivolumab 3 mg/kg.  After completing of 4 cycles of therapy we will assess imaging studies and consider for a salvage a nephrectomy if he has excellent response at that time.  Maintenance nivolumab will be continued subsequently.  Adding oral targeted therapy to immunotherapy would be used as a salvage option.  2.  IV access: Peripheral veins will be in use for the time being.  3.  Antiemetics: Prescription for Compazine will be available to him.  4.  CNS surveillance: We will obtain MRI brain to rule out CNS disease given his stage IV high risk features.  5.   Pain: Appears to be limited and manageable with the current hydrocodone regimen.  6.  Goals of care and prognosis: His disease is incurable and treatment is likely to be palliative and will delay progression from his disease.  There is a possibility of complete response along disease control interval if he has adequate response of therapy and received a salvage nephrectomy.  7.  Follow-up: We will be in the immediate future to start therapy.  60  minutes were dedicated to this visit. The time was spent on reviewing laboratory data, imaging studies, discussing treatment options,  and answering questions regarding future plan.    The natural course of this disease was reviewed today and treatment options were discussed.     A copy of this consult has been forwarded to the requesting physician.

## 2020-09-21 NOTE — Progress Notes (Signed)
START ON PATHWAY REGIMEN - Renal Cell     A cycle is every 21 days:     Nivolumab      Ipilimumab    A cycle is every 28 days:     Nivolumab   **Always confirm dose/schedule in your pharmacy ordering system**  Patient Characteristics: Stage IV/Metastatic Disease, Clear Cell, First Line, Intermediate or Poor Risk Therapeutic Status: Stage IV/Metastatic Disease Histology: Clear Cell Line of Therapy: First Line Risk Status: Intermediate Risk Intent of Therapy: Non-Curative / Palliative Intent, Discussed with Patient 

## 2020-09-23 ENCOUNTER — Telehealth: Payer: Self-pay | Admitting: Oncology

## 2020-09-23 ENCOUNTER — Other Ambulatory Visit: Payer: Self-pay | Admitting: Oncology

## 2020-09-23 NOTE — Telephone Encounter (Signed)
Scheduled per 02/21 los, patient has been called and notified. °

## 2020-09-25 ENCOUNTER — Inpatient Hospital Stay: Payer: 59

## 2020-09-25 NOTE — Progress Notes (Signed)
Pharmacist Chemotherapy Monitoring - Initial Assessment    Anticipated start date: 10/02/20   Regimen:  . Are orders appropriate based on the patient's diagnosis, regimen, and cycle? Yes . Does the plan date match the patient's scheduled date? Yes . Is the sequencing of drugs appropriate? Yes . Are the premedications appropriate for the patient's regimen? Yes . Prior Authorization for treatment is: Pending o If applicable, is the correct biosimilar selected based on the patient's insurance? not applicable  Organ Function and Labs: Marland Kitchen Are dose adjustments needed based on the patient's renal function, hepatic function, or hematologic function?  . TBD - F/u CMET on 10/02/20 . Are appropriate labs ordered prior to the start of patient's treatment? Yes . Other organ system assessment, if indicated: N/A . The following baseline labs, if indicated, have been ordered: ipilimumab: baseline TSH +/- T4 and nivolumab: baseline TSH +/- T4  Dose Assessment: . Are the drug doses appropriate? Yes . Are the following correct: o Drug concentrations Yes o IV fluid compatible with drug Yes o Administration routes Yes o Timing of therapy Yes . If applicable, does the patient have documented access for treatment and/or plans for port-a-cath placement? no . If applicable, have lifetime cumulative doses been properly documented and assessed? not applicable Lifetime Dose Tracking  No doses have been documented on this patient for the following tracked chemicals: Doxorubicin, Epirubicin, Idarubicin, Daunorubicin, Mitoxantrone, Bleomycin, Oxaliplatin, Carboplatin, Liposomal Doxorubicin  o   Toxicity Monitoring/Prevention: . The patient has the following take home antiemetics prescribed: Prochlorperazine . The patient has the following take home medications prescribed: N/A . Medication allergies and previous infusion related reactions, if applicable, have been reviewed and addressed. Yes . The patient's current  medication list has been assessed for drug-drug interactions with their chemotherapy regimen. no significant drug-drug interactions were identified on review.  Order Review: . Are the treatment plan orders signed? Yes . Is the patient scheduled to see a provider prior to their treatment? No  I verify that I have reviewed each item in the above checklist and answered each question accordingly.  Raul Del [place], [place], 09/25/2020  11:37 AM

## 2020-09-30 ENCOUNTER — Other Ambulatory Visit: Payer: Self-pay

## 2020-09-30 ENCOUNTER — Inpatient Hospital Stay: Payer: 59 | Attending: Oncology

## 2020-09-30 ENCOUNTER — Ambulatory Visit: Payer: 59 | Admitting: Oncology

## 2020-09-30 DIAGNOSIS — Z79899 Other long term (current) drug therapy: Secondary | ICD-10-CM | POA: Insufficient documentation

## 2020-09-30 DIAGNOSIS — Z7982 Long term (current) use of aspirin: Secondary | ICD-10-CM | POA: Insufficient documentation

## 2020-09-30 DIAGNOSIS — Z5112 Encounter for antineoplastic immunotherapy: Secondary | ICD-10-CM | POA: Insufficient documentation

## 2020-09-30 DIAGNOSIS — C7800 Secondary malignant neoplasm of unspecified lung: Secondary | ICD-10-CM | POA: Insufficient documentation

## 2020-09-30 DIAGNOSIS — C642 Malignant neoplasm of left kidney, except renal pelvis: Secondary | ICD-10-CM | POA: Insufficient documentation

## 2020-09-30 DIAGNOSIS — M545 Low back pain, unspecified: Secondary | ICD-10-CM | POA: Insufficient documentation

## 2020-10-01 ENCOUNTER — Encounter: Payer: Self-pay | Admitting: Licensed Clinical Social Worker

## 2020-10-01 NOTE — Progress Notes (Signed)
[facility] Psychosocial Distress Screening Clinical Social Work  Clinical Social Work was referred by distress screening protocol.  The patient scored a 5 on the Psychosocial Distress Thermometer which indicates moderate distress. Clinical Social Worker attempted to contact patient by phone to assess for distress and other psychosocial needs.  No answer. Left VM with direct contact information.  [facility] DISTRESS SCREENING 09/30/2020  Screening Type Initial Screening  Distress experienced in past week (1-10) 5  Practical problem type Insurance;Work/school  Family Problem type Other (comment)  Emotional problem type Nervousness/Anxiety;Isolation/feeling alone;Boredom;Adjusting to appearance changes  Spiritual/Religous concerns type Loss of sense of purpose  Information Concerns Type Lack of info about maintaining fitness  Physical Problem type Pain;Sleep/insomnia  Physician notified of physical symptoms Yes  Referral to clinical social work Yes  Referral to financial advocate Yes    Clinical Social Worker follow up needed: Yes.    If yes, follow up plan: CSW will try to see patient during appt tomorrow if pt does not call back.   Chyrl Elwell E Konni Kesinger, LCSW

## 2020-10-02 ENCOUNTER — Other Ambulatory Visit: Payer: Self-pay

## 2020-10-02 ENCOUNTER — Inpatient Hospital Stay: Payer: 59

## 2020-10-02 ENCOUNTER — Encounter: Payer: Self-pay | Admitting: Licensed Clinical Social Worker

## 2020-10-02 ENCOUNTER — Other Ambulatory Visit: Payer: Self-pay | Admitting: Oncology

## 2020-10-02 VITALS — BP 146/86 | HR 89 | Resp 20 | Ht 68.0 in | Wt 205.8 lb

## 2020-10-02 DIAGNOSIS — C7800 Secondary malignant neoplasm of unspecified lung: Secondary | ICD-10-CM | POA: Diagnosis not present

## 2020-10-02 DIAGNOSIS — Z7982 Long term (current) use of aspirin: Secondary | ICD-10-CM | POA: Diagnosis not present

## 2020-10-02 DIAGNOSIS — Z5112 Encounter for antineoplastic immunotherapy: Secondary | ICD-10-CM | POA: Diagnosis not present

## 2020-10-02 DIAGNOSIS — C642 Malignant neoplasm of left kidney, except renal pelvis: Secondary | ICD-10-CM

## 2020-10-02 DIAGNOSIS — M545 Low back pain, unspecified: Secondary | ICD-10-CM | POA: Diagnosis not present

## 2020-10-02 DIAGNOSIS — Z79899 Other long term (current) drug therapy: Secondary | ICD-10-CM | POA: Diagnosis not present

## 2020-10-02 LAB — CBC WITH DIFFERENTIAL (CANCER CENTER ONLY)
Abs Immature Granulocytes: 0.06 10*3/uL (ref 0.00–0.07)
Basophils Absolute: 0 10*3/uL (ref 0.0–0.1)
Basophils Relative: 1 %
Eosinophils Absolute: 0.1 10*3/uL (ref 0.0–0.5)
Eosinophils Relative: 1 %
HCT: 43 % (ref 39.0–52.0)
Hemoglobin: 14 g/dL (ref 13.0–17.0)
Immature Granulocytes: 1 %
Lymphocytes Relative: 16 %
Lymphs Abs: 1.4 10*3/uL (ref 0.7–4.0)
MCH: 29.5 pg (ref 26.0–34.0)
MCHC: 32.6 g/dL (ref 30.0–36.0)
MCV: 90.5 fL (ref 80.0–100.0)
Monocytes Absolute: 0.5 10*3/uL (ref 0.1–1.0)
Monocytes Relative: 6 %
Neutro Abs: 6.6 10*3/uL (ref 1.7–7.7)
Neutrophils Relative %: 75 %
Platelet Count: 259 10*3/uL (ref 150–400)
RBC: 4.75 MIL/uL (ref 4.22–5.81)
RDW: 13.7 % (ref 11.5–15.5)
WBC Count: 8.7 10*3/uL (ref 4.0–10.5)
nRBC: 0 % (ref 0.0–0.2)

## 2020-10-02 LAB — CMP (CANCER CENTER ONLY)
ALT: 38 U/L (ref 0–44)
AST: 14 U/L — ABNORMAL LOW (ref 15–41)
Albumin: 3.9 g/dL (ref 3.5–5.0)
Alkaline Phosphatase: 135 U/L — ABNORMAL HIGH (ref 38–126)
Anion gap: 9 (ref 5–15)
BUN: 25 mg/dL — ABNORMAL HIGH (ref 6–20)
CO2: 24 mmol/L (ref 22–32)
Calcium: 11.7 mg/dL — ABNORMAL HIGH (ref 8.9–10.3)
Chloride: 103 mmol/L (ref 98–111)
Creatinine: 1.24 mg/dL (ref 0.61–1.24)
GFR, Estimated: >60 mL/min (ref >60)
Glucose, Bld: 169 mg/dL — ABNORMAL HIGH (ref 70–99)
Potassium: 3.9 mmol/L (ref 3.5–5.1)
Sodium: 136 mmol/L (ref 135–145)
Total Bilirubin: 0.3 mg/dL (ref 0.3–1.2)
Total Protein: 8 g/dL (ref 6.5–8.1)

## 2020-10-02 LAB — TSH: TSH: 3.731 u[IU]/mL (ref 0.320–4.118)

## 2020-10-02 MED ORDER — DIPHENHYDRAMINE HCL 50 MG/ML IJ SOLN
25.0000 mg | Freq: Once | INTRAMUSCULAR | Status: AC
  Administered 2020-10-02: 25 mg via INTRAVENOUS

## 2020-10-02 MED ORDER — ZOLEDRONIC ACID 4 MG/100ML IV SOLN
INTRAVENOUS | Status: AC
  Filled 2020-10-02: qty 100

## 2020-10-02 MED ORDER — FAMOTIDINE IN NACL 20-0.9 MG/50ML-% IV SOLN
INTRAVENOUS | Status: AC
  Filled 2020-10-02: qty 50

## 2020-10-02 MED ORDER — ZOLEDRONIC ACID 4 MG/100ML IV SOLN
4.0000 mg | Freq: Once | INTRAVENOUS | Status: AC
  Administered 2020-10-02: 4 mg via INTRAVENOUS

## 2020-10-02 MED ORDER — FAMOTIDINE IN NACL 20-0.9 MG/50ML-% IV SOLN
20.0000 mg | Freq: Once | INTRAVENOUS | Status: AC
  Administered 2020-10-02: 20 mg via INTRAVENOUS

## 2020-10-02 MED ORDER — DIPHENHYDRAMINE HCL 50 MG/ML IJ SOLN
INTRAMUSCULAR | Status: AC
  Filled 2020-10-02: qty 1

## 2020-10-02 MED ORDER — NIVOLUMAB CHEMO INJECTION 100 MG/10ML
3.0900 mg/kg | Freq: Once | INTRAVENOUS | Status: AC
  Administered 2020-10-02: 280 mg via INTRAVENOUS
  Filled 2020-10-02: qty 24

## 2020-10-02 MED ORDER — SODIUM CHLORIDE 0.9 % IV SOLN
1.0000 mg/kg | Freq: Once | INTRAVENOUS | Status: AC
  Administered 2020-10-02: 100 mg via INTRAVENOUS
  Filled 2020-10-02: qty 20

## 2020-10-02 MED ORDER — SODIUM CHLORIDE 0.9 % IV SOLN
Freq: Once | INTRAVENOUS | Status: AC
  Filled 2020-10-02: qty 250

## 2020-10-02 NOTE — Patient Instructions (Signed)
[facility] Discharge Instructions for Patients Receiving Chemotherapy  Today you received the following chemotherapy agents: Nivolumab (Opdivo) and Ipilimumab [facility])  To help prevent nausea and vomiting after your treatment, we encourage you to take your nausea medication  as prescribed.    If you develop nausea and vomiting that is not controlled by your nausea medication, call the clinic.   BELOW ARE SYMPTOMS THAT SHOULD BE REPORTED IMMEDIATELY:  *FEVER GREATER THAN 100.5 F  *CHILLS WITH OR WITHOUT FEVER  NAUSEA AND VOMITING THAT IS NOT CONTROLLED WITH YOUR NAUSEA MEDICATION  *UNUSUAL SHORTNESS OF BREATH  *UNUSUAL BRUISING OR BLEEDING  TENDERNESS IN MOUTH AND THROAT WITH OR WITHOUT PRESENCE OF ULCERS  *URINARY PROBLEMS  *BOWEL PROBLEMS  UNUSUAL RASH Items with * indicate a potential emergency and should be followed up as soon as possible.  Feel free to call the clinic should you have any questions or concerns. The clinic phone number is [phone].  Please show the [place] at check-in to the Emergency Department and triage nurse.  Nivolumab injection What is this medicine? NIVOLUMAB (nye VOL ue mab) is a monoclonal antibody. It treats certain types of cancer. Some of the cancers treated are colon cancer, head and neck cancer, Hodgkin lymphoma, lung cancer, and melanoma. This medicine may be used for other purposes; ask your health care provider or pharmacist if you have questions. COMMON BRAND NAME(S): Opdivo What should I tell my health care provider before I take this medicine? They need to know if you have any of these conditions:  autoimmune diseases like Crohn's disease, ulcerative colitis, or lupus  have had or planning to have an allogeneic stem cell transplant (uses someone else's stem cells)  history of chest radiation  history of organ transplant  nervous system problems like myasthenia gravis or Guillain-Barre  syndrome  an unusual or allergic reaction to nivolumab, other medicines, foods, dyes, or preservatives  pregnant or trying to get pregnant  breast-feeding How should I use this medicine? This medicine is for infusion into a vein. It is given by a health care professional in a hospital or clinic setting. A special MedGuide will be given to you before each treatment. Be sure to read this information carefully each time. Talk to your pediatrician regarding the use of this medicine in children. While this drug may be prescribed for children as young as 12 years for selected conditions, precautions do apply. Overdosage: If you think you have taken too much of this medicine contact a poison control center or emergency room at once. NOTE: This medicine is only for you. Do not share this medicine with others. What if I miss a dose? It is important not to miss your dose. Call your doctor or health care professional if you are unable to keep an appointment. What may interact with this medicine? Interactions have not been studied. This list may not describe all possible interactions. Give your health care provider a list of all the medicines, herbs, non-prescription drugs, or dietary supplements you use. Also tell them if you smoke, drink alcohol, or use illegal drugs. Some items may interact with your medicine. What should I watch for while using this medicine? This drug may make you feel generally unwell. Continue your course of treatment even though you feel ill unless your doctor tells you to stop. You may need blood work done while you are taking this medicine. Do not become pregnant while taking this medicine or for 5 months after  stopping it. Women should inform their doctor if they wish to become pregnant or think they might be pregnant. There is a potential for serious side effects to an unborn child. Talk to your health care professional or pharmacist for more information. Do not breast-feed an  infant while taking this medicine or for 5 months after stopping it. What side effects may I notice from receiving this medicine? Side effects that you should report to your doctor or health care professional as soon as possible:  allergic reactions like skin rash, itching or hives, swelling of the face, lips, or tongue  breathing problems  blood in the urine  bloody or watery diarrhea or black, tarry stools  changes in emotions or moods  changes in vision  chest pain  cough  dizziness  feeling faint or lightheaded, falls  fever, chills  headache with fever, neck stiffness, confusion, loss of memory, sensitivity to light, hallucination, loss of contact with reality, or seizures  joint pain  mouth sores  redness, blistering, peeling or loosening of the skin, including inside the mouth  severe muscle pain or weakness  signs and symptoms of high blood sugar such as dizziness; dry mouth; dry skin; fruity breath; nausea; stomach pain; increased hunger or thirst; increased urination  signs and symptoms of kidney injury like trouble passing urine or change in the amount of urine  signs and symptoms of liver injury like dark yellow or brown urine; general ill feeling or flu-like symptoms; light-colored stools; loss of appetite; nausea; right upper belly pain; unusually weak or tired; yellowing of the eyes or skin  swelling of the ankles, feet, hands  trouble passing urine or change in the amount of urine  unusually weak or tired  weight gain or loss Side effects that usually do not require medical attention (report to your doctor or health care professional if they continue or are bothersome):  bone pain  constipation  decreased appetite  diarrhea  muscle pain  nausea, vomiting  tiredness This list may not describe all possible side effects. Call your doctor for medical advice about side effects. You may report side effects to FDA at 1-800-FDA-1088. Where  should I keep my medicine? This drug is given in a hospital or clinic and will not be stored at home. NOTE: This sheet is a summary. It may not cover all possible information. If you have questions about this medicine, talk to your doctor, pharmacist, or health care provider.  2021 Elsevier/Gold Standard (2019-11-20 10:08:25)  Ipilimumab injection What is this medicine? IPILIMUMAB (IP i LIM ue mab) is a monoclonal antibody. It is used to treat colorectal cancer, kidney cancer, liver cancer, lung cancer, melanoma, and mesothelioma. This medicine may be used for other purposes; ask your health care provider or pharmacist if you have questions. COMMON BRAND NAME(S): YERVOY What should I tell my health care provider before I take this medicine? They need to know if you have any of these conditions:  autoimmune diseases like Crohn's disease, ulcerative colitis, or lupus  have had or planning to have an allogeneic stem cell transplant (uses someone else's stem cells)  history of organ transplant  nervous system problems like myasthenia gravis or Guillain-Barre syndrome  an unusual or allergic reaction to ipilimumab, other medicines, foods, dyes, or preservatives  pregnant or trying to get pregnant  breast-feeding How should I use this medicine? This medicine is for infusion into a vein. It is given by a health care professional in a hospital or clinic  setting. A special MedGuide will be given to you before each treatment. Be sure to read this information carefully each time. Talk to your pediatrician regarding the use of this medicine in children. While this drug may be prescribed for children as young as 12 years for selected conditions, precautions do apply. Overdosage: If you think you have taken too much of this medicine contact a poison control center or emergency room at once. NOTE: This medicine is only for you. Do not share this medicine with others. What if I miss a dose? It is  important not to miss your dose. Call your doctor or health care professional if you are unable to keep an appointment. What may interact with this medicine? Interactions are not expected. This list may not describe all possible interactions. Give your health care provider a list of all the medicines, herbs, non-prescription drugs, or dietary supplements you use. Also tell them if you smoke, drink alcohol, or use illegal drugs. Some items may interact with your medicine. What should I watch for while using this medicine? Tell your doctor or healthcare professional if your symptoms do not start to get better or if they get worse. Do not become pregnant while taking this medicine or for 3 months after stopping it. Women should inform their doctor if they wish to become pregnant or think they might be pregnant. There is a potential for serious side effects to an unborn child. Talk to your health care professional or pharmacist for more information. Do not breast-feed an infant while taking this medicine or for 3 months after the last dose. Your condition will be monitored carefully while you are receiving this medicine. You may need blood work done while you are taking this medicine. What side effects may I notice from receiving this medicine? Side effects that you should report to your doctor or health care professional as soon as possible:  allergic reactions like skin rash, itching or hives, swelling of the face, lips, or tongue  black, tarry stools  bloody or watery diarrhea  changes in vision  dizziness  eye pain  fast, irregular heartbeat  feeling anxious  feeling faint or lightheaded, falls  nausea, vomiting  pain, tingling, numbness in the hands or feet  redness, blistering, peeling or loosening of the skin, including inside the mouth  signs and symptoms of liver injury like dark yellow or brown urine; general ill feeling or flu-like symptoms; light-colored stools; loss of  appetite; nausea; right upper belly pain; unusually weak or tired; yellowing of the eyes or skin  unusual bleeding or bruising Side effects that usually do not require medical attention (report to your doctor or health care professional if they continue or are bothersome):  headache  loss of appetite  trouble sleeping This list may not describe all possible side effects. Call your doctor for medical advice about side effects. You may report side effects to FDA at 1-800-FDA-1088. Where should I keep my medicine? This drug is given in a hospital or clinic and will not be stored at home. NOTE: This sheet is a summary. It may not cover all possible information. If you have questions about this medicine, talk to your doctor, pharmacist, or health care provider.  2021 Elsevier/Gold Standard (2019-06-19 18:53:00)  Zoledronic Acid Injection (Hypercalcemia, Oncology) What is this medicine? ZOLEDRONIC ACID (ZOE le dron ik AS id) slows calcium loss from bones. It high calcium levels in the blood from some kinds of cancer. It may be used in other  people at risk for bone loss. This medicine may be used for other purposes; ask your health care provider or pharmacist if you have questions. COMMON BRAND NAME(S): Zometa What should I tell my health care provider before I take this medicine? They need to know if you have any of these conditions:  cancer  dehydration  dental disease  kidney disease  liver disease  low levels of calcium in the blood  lung or breathing disease (asthma)  receiving steroids like dexamethasone or prednisone  an unusual or allergic reaction to zoledronic acid, other medicines, foods, dyes, or preservatives  pregnant or trying to get pregnant  breast-feeding How should I use this medicine? This drug is injected into a vein. It is given by a health care provider in a hospital or clinic setting. Talk to your health care provider about the use of this drug in  children. Special care may be needed. Overdosage: If you think you have taken too much of this medicine contact a poison control center or emergency room at once. NOTE: This medicine is only for you. Do not share this medicine with others. What if I miss a dose? Keep appointments for follow-up doses. It is important not to miss your dose. Call your health care provider if you are unable to keep an appointment. What may interact with this medicine?  certain antibiotics given by injection  NSAIDs, medicines for pain and inflammation, like ibuprofen or naproxen  some diuretics like bumetanide, furosemide  teriparatide  thalidomide This list may not describe all possible interactions. Give your health care provider a list of all the medicines, herbs, non-prescription drugs, or dietary supplements you use. Also tell them if you smoke, drink alcohol, or use illegal drugs. Some items may interact with your medicine. What should I watch for while using this medicine? Visit your health care provider for regular checks on your progress. It may be some time before you see the benefit from this drug. Some people who take this drug have severe bone, joint, or muscle pain. This drug may also increase your risk for jaw problems or a broken thigh bone. Tell your health care provider right away if you have severe pain in your jaw, bones, joints, or muscles. Tell you health care provider if you have any pain that does not go away or that gets worse. Tell your dentist and dental surgeon that you are taking this drug. You should not have major dental surgery while on this drug. See your dentist to have a dental exam and fix any dental problems before starting this drug. Take good care of your teeth while on this drug. Make sure you see your dentist for regular follow-up appointments. You should make sure you get enough calcium and vitamin D while you are taking this drug. Discuss the foods you eat and the vitamins  you take with your health care provider. Check with your health care provider if you have severe diarrhea, nausea, and vomiting, or if you sweat a lot. The loss of too much body fluid may make it dangerous for you to take this drug. You may need blood work done while you are taking this drug. Do not become pregnant while taking this drug. Women should inform their health care provider if they wish to become pregnant or think they might be pregnant. There is potential for serious harm to an unborn child. Talk to your health care provider for more information. What side effects may I notice from receiving  this medicine? Side effects that you should report to your doctor or health care provider as soon as possible:  allergic reactions (skin rash, itching or hives; swelling of the face, lips, or tongue)  bone pain  infection (fever, chills, cough, sore throat, pain or trouble passing urine)  jaw pain, especially after dental work  joint pain  kidney injury (trouble passing urine or change in the amount of urine)  low blood pressure (dizziness; feeling faint or lightheaded, falls; unusually weak or tired)  low calcium levels (fast heartbeat; muscle cramps or pain; pain, tingling, or numbness in the hands or feet; seizures)  low magnesium levels (fast, irregular heartbeat; muscle cramp or pain; muscle weakness; tremors; seizures)  low red blood cell counts (trouble breathing; feeling faint; lightheaded, falls; unusually weak or tired)  muscle pain  redness, blistering, peeling, or loosening of the skin, including inside the mouth  severe diarrhea  swelling of the ankles, feet, hands  trouble breathing Side effects that usually do not require medical attention (report to your doctor or health care provider if they continue or are bothersome):  anxious  constipation  coughing  depressed mood  eye irritation, itching, or pain  fever  general ill feeling or flu-like  symptoms  nausea  pain, redness, or irritation at site where injected  trouble sleeping This list may not describe all possible side effects. Call your doctor for medical advice about side effects. You may report side effects to FDA at 1-800-FDA-1088. Where should I keep my medicine? This drug is given in a hospital or clinic. It will not be stored at home. NOTE: This sheet is a summary. It may not cover all possible information. If you have questions about this medicine, talk to your doctor, pharmacist, or health care provider.  2021 Elsevier/Gold Standard (2019-05-02 09:13:00)

## 2020-10-02 NOTE — Progress Notes (Signed)
[facility] Psychosocial Distress Screening Clinical Social Work  Clinical Social Work was referred by distress screening protocol.  The patient scored a 5 on the Psychosocial Distress Thermometer which indicates moderate distress. Clinical Social Worker met with patient in infusion to assess for distress and other psychosocial needs.   Patient reports that he is adjusting to this new schedule and is not sure of what he will need going forward. 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 Declined applying for social security disability at this time.  He lives alone and has limited support locally other than a few friends who he does not want to ask for support from as they are working.  CSW reviewed transportation services to help with rides to appointments but patient wants to hold off for now.  CSW also reviewed other resources through [facility] and support groups. Patient states that he wants to see how things go and agreed to contact this CSW if he decides to pursue any of the resources discussed today.  CSW provided contact information and encouraged patient to call with any questions or concerns.  ON[facility] DISTRESS SCREENING 09/30/2020  Screening Type Initial Screening  Distress experienced in past week (1-10) 5  Practical problem type Insurance;Work/school  Family Problem type Other (comment)  Emotional problem type Nervousness/Anxiety;Isolation/feeling alone;Boredom;Adjusting to appearance changes  Spiritual/Religous concerns type Loss of sense of purpose  Information Concerns Type Lack of info about maintaining fitness  Physical Problem type Pain;Sleep/insomnia  Physician notified of physical symptoms Yes  Referral to clinical social work Yes  Referral to financial advocate Yes    Clinical Social Worker follow up needed: No. Patient declined referrals and resources discussed today for the time being. He will  call if he decides to move forward with any of the support available.    MICHELLE E ZAVALA, LCSW

## 2020-10-02 NOTE — Progress Notes (Signed)
[facility] Patient Monitoring checklist completed with patient prior to infusion. Dr. Alen Blew notified of patient's responses. Still okay to proceed with treatment today.

## 2020-10-03 ENCOUNTER — Encounter (HOSPITAL_COMMUNITY): Payer: Self-pay | Admitting: Emergency Medicine

## 2020-10-03 ENCOUNTER — Emergency Department (HOSPITAL_COMMUNITY)
Admission: EM | Admit: 2020-10-03 | Discharge: 2020-10-04 | Disposition: A | Discharge status: Home / Self Care | Payer: 59 | Attending: Emergency Medicine | Admitting: Emergency Medicine

## 2020-10-03 ENCOUNTER — Emergency Department (HOSPITAL_COMMUNITY): Payer: 59

## 2020-10-03 DIAGNOSIS — C7800 Secondary malignant neoplasm of unspecified lung: Secondary | ICD-10-CM | POA: Insufficient documentation

## 2020-10-03 DIAGNOSIS — Z20822 Contact with and (suspected) exposure to covid-19: Secondary | ICD-10-CM | POA: Insufficient documentation

## 2020-10-03 DIAGNOSIS — K5900 Constipation, unspecified: Secondary | ICD-10-CM | POA: Insufficient documentation

## 2020-10-03 DIAGNOSIS — C649 Malignant neoplasm of unspecified kidney, except renal pelvis: Secondary | ICD-10-CM

## 2020-10-03 DIAGNOSIS — Z79899 Other long term (current) drug therapy: Secondary | ICD-10-CM | POA: Diagnosis not present

## 2020-10-03 DIAGNOSIS — F17200 Nicotine dependence, unspecified, uncomplicated: Secondary | ICD-10-CM | POA: Insufficient documentation

## 2020-10-03 DIAGNOSIS — R140 Abdominal distension (gaseous): Secondary | ICD-10-CM | POA: Diagnosis not present

## 2020-10-03 DIAGNOSIS — R0789 Other chest pain: Secondary | ICD-10-CM | POA: Diagnosis not present

## 2020-10-03 DIAGNOSIS — R509 Fever, unspecified: Secondary | ICD-10-CM

## 2020-10-03 DIAGNOSIS — I10 Essential (primary) hypertension: Secondary | ICD-10-CM | POA: Diagnosis not present

## 2020-10-03 HISTORY — DX: Malignant (primary) neoplasm, unspecified: C80.1

## 2020-10-03 LAB — CBC WITH DIFFERENTIAL/PLATELET
Abs Immature Granulocytes: 0.06 10*3/uL (ref 0.00–0.07)
Basophils Absolute: 0 10*3/uL (ref 0.0–0.1)
Basophils Relative: 0 %
Eosinophils Absolute: 0 10*3/uL (ref 0.0–0.5)
Eosinophils Relative: 0 %
HCT: 38.4 % — ABNORMAL LOW (ref 39.0–52.0)
Hemoglobin: 12.5 g/dL — ABNORMAL LOW (ref 13.0–17.0)
Immature Granulocytes: 1 %
Lymphocytes Relative: 6 %
Lymphs Abs: 0.5 10*3/uL — ABNORMAL LOW (ref 0.7–4.0)
MCH: 29.2 pg (ref 26.0–34.0)
MCHC: 32.6 g/dL (ref 30.0–36.0)
MCV: 89.7 fL (ref 80.0–100.0)
Monocytes Absolute: 0.3 10*3/uL (ref 0.1–1.0)
Monocytes Relative: 4 %
Neutro Abs: 7.4 10*3/uL (ref 1.7–7.7)
Neutrophils Relative %: 89 %
Platelets: 225 10*3/uL (ref 150–400)
RBC: 4.28 MIL/uL (ref 4.22–5.81)
RDW: 13.6 % (ref 11.5–15.5)
WBC: 8.3 10*3/uL (ref 4.0–10.5)
nRBC: 0 % (ref 0.0–0.2)

## 2020-10-03 LAB — COMPREHENSIVE METABOLIC PANEL
ALT: 33 U/L (ref 0–44)
AST: 21 U/L (ref 15–41)
Albumin: 3.6 g/dL (ref 3.5–5.0)
Alkaline Phosphatase: 114 U/L (ref 38–126)
Anion gap: 12 (ref 5–15)
BUN: 24 mg/dL — ABNORMAL HIGH (ref 6–20)
CO2: 23 mmol/L (ref 22–32)
Calcium: 9.9 mg/dL (ref 8.9–10.3)
Chloride: 93 mmol/L — ABNORMAL LOW (ref 98–111)
Creatinine, Ser: 1.3 mg/dL — ABNORMAL HIGH (ref 0.61–1.24)
GFR, Estimated: >60 mL/min (ref >60)
Glucose, Bld: 129 mg/dL — ABNORMAL HIGH (ref 70–99)
Potassium: 3.7 mmol/L (ref 3.5–5.1)
Sodium: 128 mmol/L — ABNORMAL LOW (ref 135–145)
Total Bilirubin: 1.2 mg/dL (ref 0.3–1.2)
Total Protein: 7.2 g/dL (ref 6.5–8.1)

## 2020-10-03 LAB — PROTIME-INR
INR: 1 (ref 0.8–1.2)
Prothrombin Time: 13.2 seconds (ref 11.4–15.2)

## 2020-10-03 IMAGING — DX DG CHEST 2V
2 series · 2 of 2 positions shown · non-contrast
Comparison: [DATE]

CLINICAL DATA: Pneumonia

EXAM:
CHEST - 2 VIEW

[chest lat]
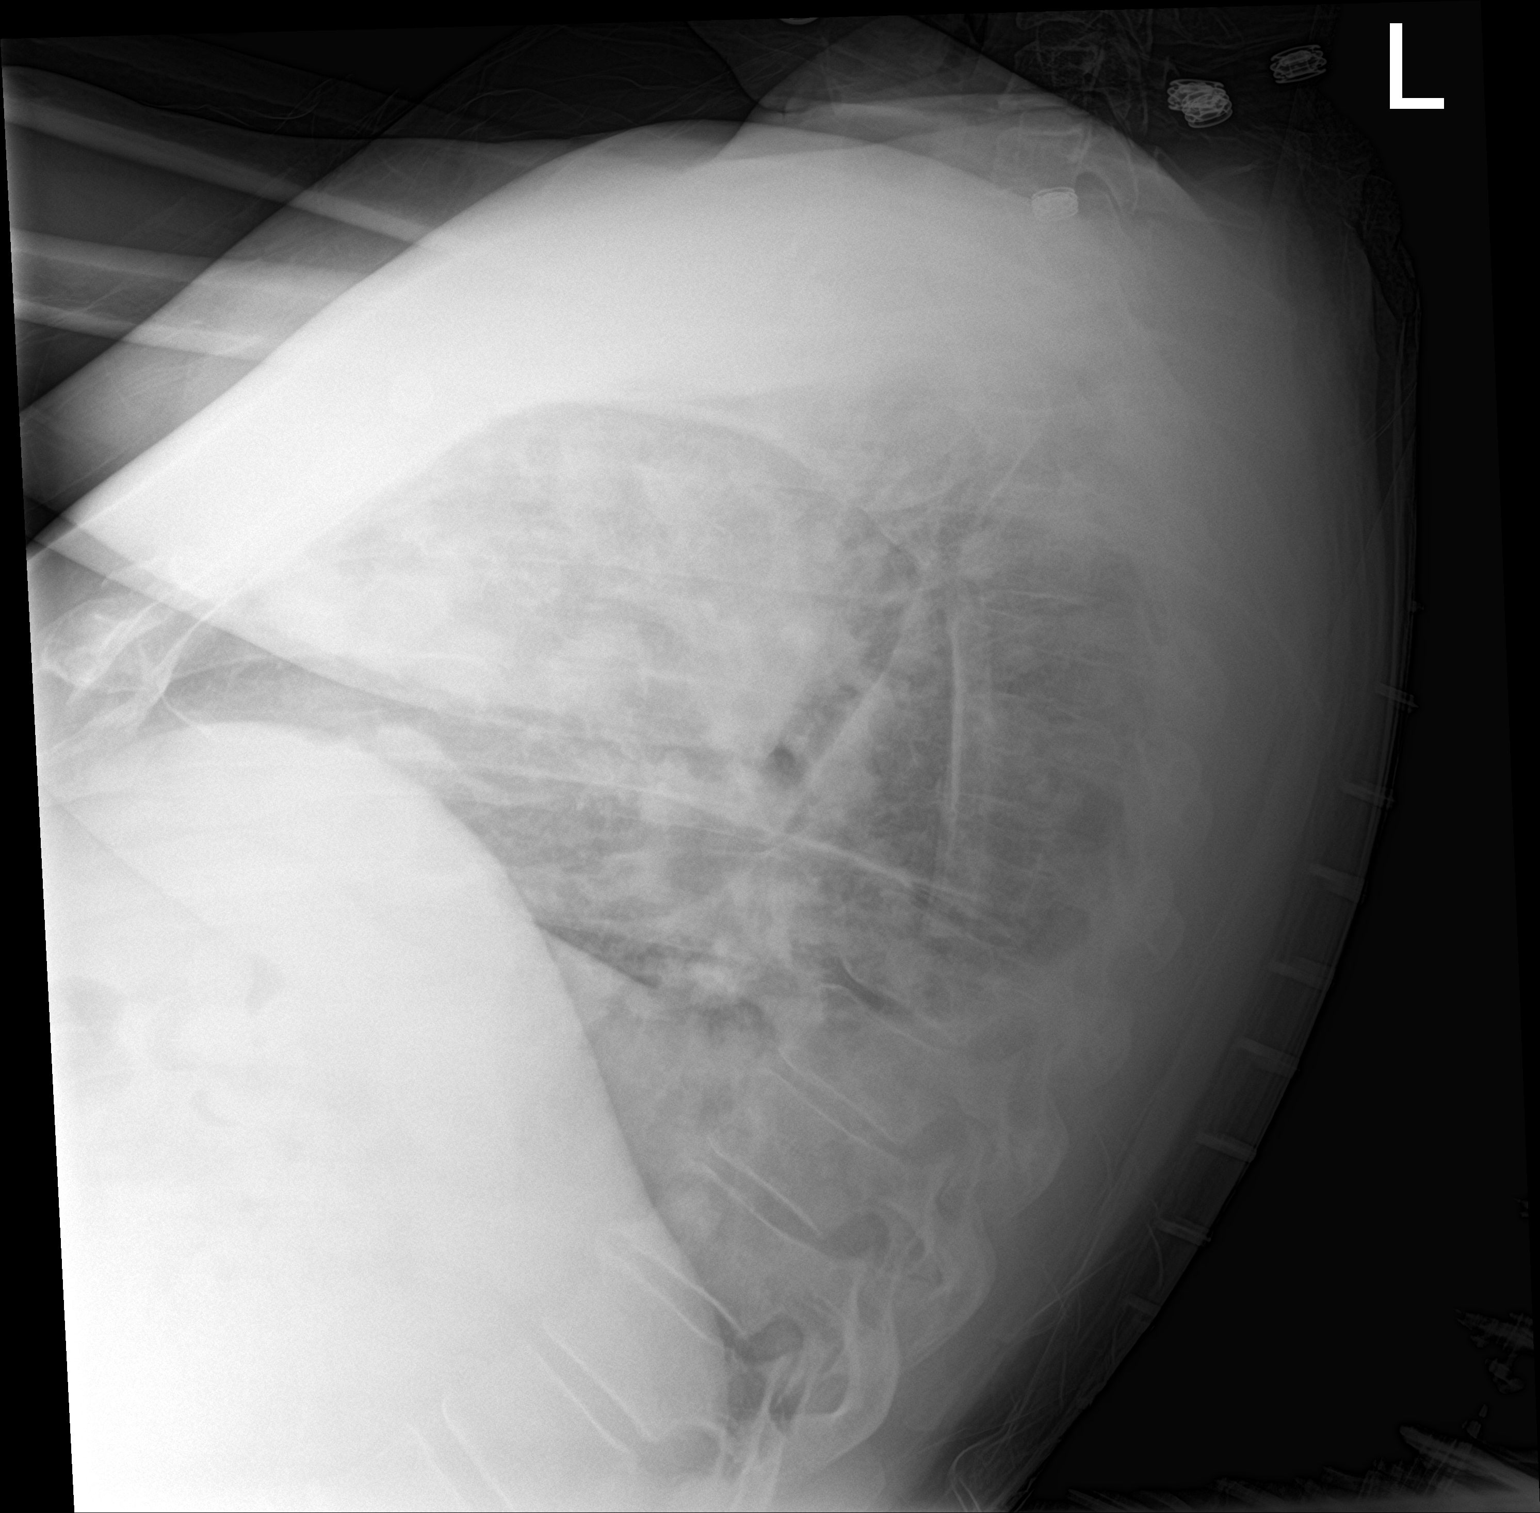

[chest ap]
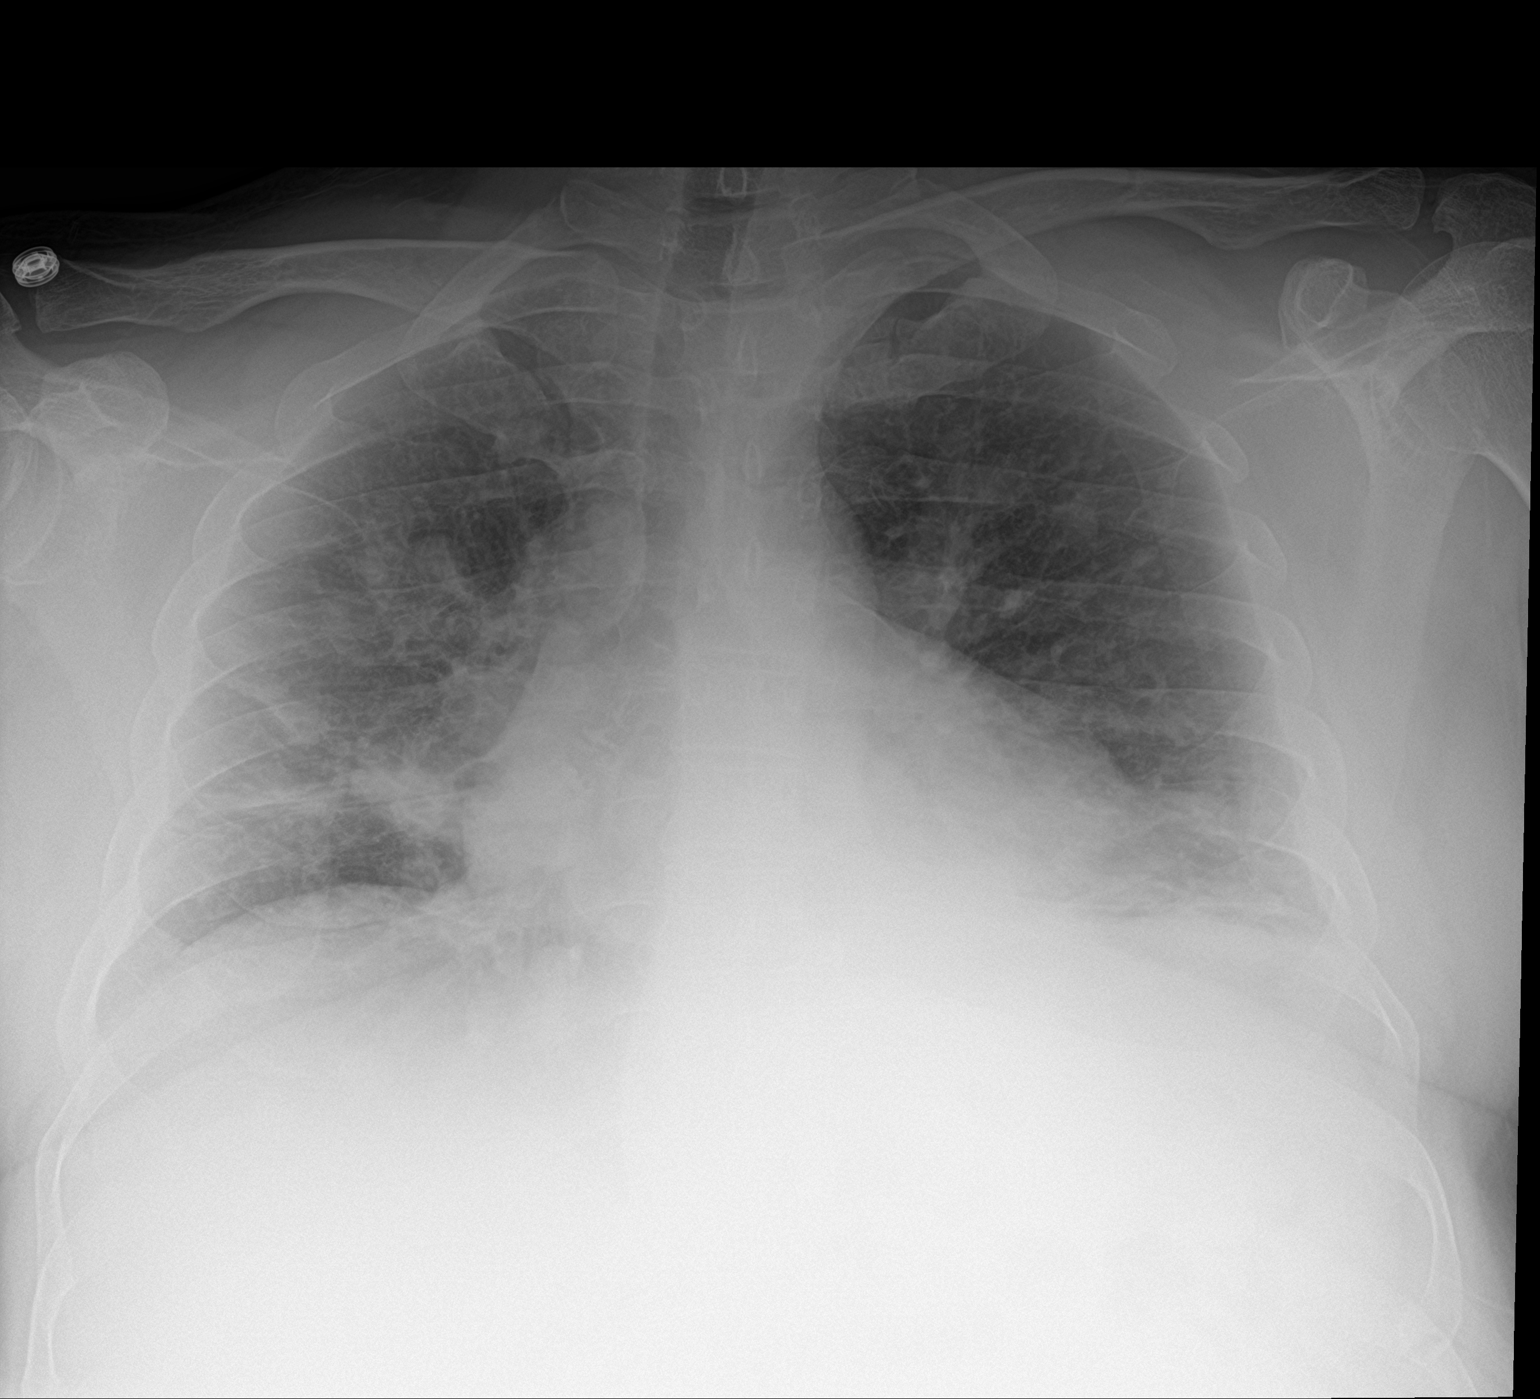

[2 of 2 positions shown; findings below may reference images not displayed]

FINDINGS: Lung volumes are small and there has developed bibasilar
atelectasis. Discoid atelectasis versus scarring within the right
mid lung zone. No definite superimposed focal pulmonary infiltrate.
Vascular crowding at the hila. No pneumothorax or pleural effusion.
Cardiac size within normal limits. No acute bone abnormality.
IMPRESSION: Pulmonary hypoinflation.

## 2020-10-03 NOTE — ED Provider Notes (Signed)
[facility], [place] EMERGENCY DEPARTMENT Provider Note   CSN: [id number] Arrival date & time: 10/03/20  2103     History Chief Complaint  Patient presents with  . Fever    Bob Ewing is a 50 y.o. male.  50yo F w/ PMH including HTN, recent diagnosis of renal CA who p/w fever. PT had first dose of chemotherapy for cancer 2 days ago. Today ~2h PTA he developed a fever at home. He was told this may happen after treatment. He endorses decreased appetite and constipation, took OTC which did not help constipation. Last BM 2 days ago. Denies abd pain or vomiting. No cough or cold sx, no sick contacts, no urinary symptoms. He reports R lateral chest pain and SOB ever since breaking a rib in February. He continues to have these symptoms but reports no significant change since FEb. Pain is worse w/ deep inspiration. He has not noticed any leg swelling.   The history is provided by the patient.  Fever      Past Medical History:  Diagnosis Date  . Cancer ([place])   . Hypertension     Patient Active Problem List   Diagnosis Date Noted  . Hypercalcemia 10/02/2020  . 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 Goals of care, counseling/discussion 09/21/2020    History reviewed. No pertinent surgical history.     No family history on file.  Social History   Tobacco Use  . Smoking status: Current Every Day Smoker  . Smokeless tobacco: Never Used  Vaping Use  . Vaping Use: Never used  Substance Use Topics  . Alcohol use: Yes  . Drug use: Never    Home Medications Prior to Admission medications   Medication Sig Start Date End Date Taking? Authorizing Provider  atenolol (TENORMIN) 50 MG tablet Take 50 mg by mouth daily.   Yes [provider]  hydrochlorothiazide (HYDRODIURIL) 25 MG tablet Take 25 mg by mouth daily.   Yes [provider]  simethicone (MYLICON) 595 MG chewable tablet Chew 250 mg  by mouth every 6 (six) hours as needed for flatulence.   Yes [provider]    Allergies    Phentermine  Review of Systems   Review of Systems  Constitutional: Positive for fever.   All other systems reviewed and are negative except that which was mentioned in HPI  Physical Exam Updated Vital Signs BP 119/72 (BP Location: Right Arm)   Pulse 81   Temp 98 F (36.7 C) (Oral)   Resp 14   SpO2 95%   Physical Exam Constitutional:      General: He is not in acute distress.    Appearance: Normal appearance.  HENT:     Head: Normocephalic and atraumatic.  Eyes:     Conjunctiva/sclera: Conjunctivae normal.  Cardiovascular:     Rate and Rhythm: Normal rate and regular rhythm.     Heart sounds: Normal heart sounds. No murmur heard.   Pulmonary:     Effort: Pulmonary effort is normal.     Breath sounds: Normal breath sounds.  Abdominal:     General: Abdomen is flat. Bowel sounds are normal. There is distension (mild).     Palpations: Abdomen is soft.     Tenderness: There is no abdominal tenderness.  Musculoskeletal:     Comments: Trace edema b/l ankles  Skin:    General: Skin is warm and dry.  Neurological:  Mental Status: He is alert and oriented to person, place, and time.     Comments: fluent  Psychiatric:        Behavior: Behavior normal.     Comments: Depressed affect     ED Results / Procedures / Treatments   Labs (all labs ordered are listed, but only abnormal results are displayed) Labs Reviewed  COMPREHENSIVE METABOLIC PANEL - Abnormal; Notable for the following components:      Result Value   Sodium 128 (*)    Chloride 93 (*)    Glucose, Bld 129 (*)    BUN 24 (*)    Creatinine, Ser 1.30 (*)    All other components within normal limits  CBC WITH DIFFERENTIAL/PLATELET - Abnormal; Notable for the following components:   Hemoglobin 12.5 (*)    HCT 38.4 (*)    Lymphs Abs 0.5 (*)    All other components within normal limits  URINALYSIS,  ROUTINE W REFLEX MICROSCOPIC - Abnormal; Notable for the following components:   Hgb urine dipstick SMALL (*)    All other components within normal limits  RESP PANEL BY RT-PCR (FLU A&B, COVID) ARPGX2  CULTURE, BLOOD (ROUTINE X 2)  CULTURE, BLOOD (ROUTINE X 2)  URINE CULTURE  LACTIC ACID, PLASMA  LACTIC ACID, PLASMA  PROTIME-INR    EKG None  Radiology DG Chest 2 View  Result Date: 10/03/2020 CLINICAL DATA:  Pneumonia EXAM: CHEST - 2 VIEW COMPARISON:  09/03/2020 FINDINGS: Lung volumes are small and there has developed bibasilar atelectasis. Discoid atelectasis versus scarring within the right mid lung zone. No definite superimposed focal pulmonary infiltrate. Vascular crowding at the hila. No pneumothorax or pleural effusion. Cardiac size within normal limits. No acute bone abnormality. IMPRESSION: Pulmonary hypoinflation. Electronically Signed   By: Fidela Salisbury MD   On: 10/03/2020 23:10   CT Angio Chest PE W/Cm &/Or Wo Cm  Result Date: 10/04/2020 CLINICAL DATA:  PE suspected EXAM: CT ANGIOGRAPHY CHEST WITH CONTRAST TECHNIQUE: Multidetector CT imaging of the chest was performed using the standard protocol during bolus administration of intravenous contrast. Multiplanar CT image reconstructions and MIPs were obtained to evaluate the vascular anatomy. CONTRAST:  116mL OMNIPAQUE IOHEXOL 350 MG/ML SOLN COMPARISON:  CT 09/01/2020, CT biopsy 09/14/2020, chest radiograph 10/03/2020 FINDINGS: Cardiovascular: Satisfactory opacification of pulmonary arteries to the segmental level. There is significant narrowing of the distal main and central lobar pulmonary arteries in the bilateral hila secondary to the large confluent hilar nodal conglomerate. No convincing pulmonary artery emboli are identified. Cardiac size is top normal. No pericardial effusion. The aortic root is suboptimally assessed given cardiac pulsation artifact. The aorta is normal caliber. No acute luminal abnormality of the imaged aorta.  No periaortic stranding or hemorrhage. Shared origin of the brachiocephalic and left common carotid arteries. Proximal great vessels are otherwise unremarkable. No major venous abnormalities. Mediastinum/Nodes: Significantly increased size of the bulky hilar nodal conglomerate size with resulting narrowing of the hilar vessels and airways. On the right this measures approximately 4.1 x 7.2 x 6.1 cm in conglomerate size separate nodal deposits are present in the left hilum measuring up to 2.0 cm superiorly and 2.9 cm inferiorly. No free mediastinal fluid or gas. Thyroid gland and thoracic inlet are unremarkable. No axillary adenopathy. Lungs/Pleura: New large left pleural effusion, possibly malignant. Atelectatic collapse the left lower lobe with several fluid-filled airways beyond a region obstruction secondary to the hilar nodal conglomerate. Some underlying postobstructive pneumonia/infectious process is not excluded. Additional narrowed and obstructed airways are  noted throughout the right lung as well with further atelectasis. Background of paraseptal emphysematous changes in the lungs. Numerous rounded metastatic lesions are seen throughout the lung parenchyma bilaterally significantly increased in number and size from comparison imaging 09/01/2020. No pneumothorax. Upper Abdomen: Increasingly conspicuous thickening of the left adrenal gland. Large heterogeneous mass arising from the upper pole left kidney, possibly increased from prior though incompletely visualized and characterized on this nondedicated exam. Extensive surrounding stranding and inflammatory changes. Musculoskeletal: Increasing size of a expansile lytic lesion in the posterior right ninth rib measuring up to 2.8 x 1.3 cm with soft tissue extension. Lytic lesion noted in the left humeral head measuring 2.7 by 1.7 cm in size with mild soft tissue extension as well. No other concerning lytic or blastic lesions are evident Review of the MIP images  confirms the above findings. IMPRESSION: 1. No evidence of acute pulmonary artery emboli. 2. Significantly worsened metastatic disease seen throughout the chest and upper abdomen including a significant increase in size of the bilateral bulky hilar nodal deposits resulting and narrowing of the hilar vessels and airways. 3. Atelectatic collapse the left lower lobe with several fluid-filled airways. Underlying postobstructive pneumonia/infectious process is not excluded. 4. New large left pleural effusion, possibly malignant. 5. Numerous rounded metastatic lesions throughout the lung parenchyma bilaterally significantly increased in number and size from comparison. 6. Increasing size of the lytic metastatic lesions in the left humeral head and right ninth rib. 7. Large heterogeneous mass arising from the upper pole left kidney, possibly increased from prior though incompletely visualized and characterized on this nondedicated exam. 8. Increasingly conspicuous thickening of the left adrenal gland, concerning for metastatic disease as well. 9. Emphysema (ICD10-J43.9). These results were called by telephone at the time of interpretation on 10/04/2020 at 2:12 am to provider Esli Jernigan , who verbally acknowledged these results. Electronically Signed   By: Lovena Le M.D.   On: 10/04/2020 02:12    Procedures Procedures   Medications Ordered in ED Medications  acetaminophen (TYLENOL) tablet 1,000 mg (1,000 mg Oral Given 10/04/20 0058)  sodium chloride 0.9 % bolus 1,000 mL (0 mLs Intravenous Stopped 10/04/20 0426)  iohexol (OMNIPAQUE) 350 MG/ML injection 100 mL (100 mLs Intravenous Contrast Given 10/04/20 0149)    ED Course  I have reviewed the triage vital signs and the nursing notes.  Pertinent labs & imaging results that were available during my care of the patient were reviewed by me and considered in my medical decision making (see chart for details).    MDM Rules/Calculators/A&P                            Alert and non-toxic on exam, T 99, ~96% on RA, reassuring BP and HR. No focal signs of infection.  Lab work notable for sodium 128, chloride 93, creatinine 1.3, normal WBC count, hemoglobin 12.5, normal lactate, normal UA, chest x-ray negative acute.  COVID-19 negative.  Obtained CTA of chest to rule out PE given his pleuritic chest pain and active cancer.  CTA negative for PE but does show significant worsening of metastatic disease.  He also has left pleural effusion and multiple areas of lungs affected by metastatic disease.  I discussed findings with his oncologist, Dr. Alen Blew, who did not feel he needed admission for these findings but will f/u with patient in clinic. He stated fever may have been from infusion itself. PT has been afebrile during ED course with no  respiratory complaints. Discussed w/u findings with him and need for close oncology f/u. REviewed return precautions and he voiced understanding.   Final Clinical Impression(s) / ED Diagnoses Final diagnoses:  Secondary renal cell carcinoma of lung, unspecified laterality ([place])  Fever, unspecified fever cause    Rx / DC Orders ED Discharge Orders    None       Odie Rauen, Wenda Overland, MD 10/04/20 [phone]

## 2020-10-03 NOTE — ED Notes (Signed)
Pt returned from imaging.

## 2020-10-03 NOTE — ED Notes (Signed)
Pt transported to xray 

## 2020-10-03 NOTE — ED Triage Notes (Signed)
Pt c/o fever up to 102 at home today, pt reports starting chemo for kidney CA yesterday. Pt appears generally weak. Minimal HA. Continues to have pain from rib fx s/p fall in February.

## 2020-10-04 ENCOUNTER — Emergency Department (HOSPITAL_COMMUNITY): Payer: 59

## 2020-10-04 LAB — URINALYSIS, ROUTINE W REFLEX MICROSCOPIC
Bacteria, UA: NONE SEEN
Bilirubin Urine: NEGATIVE
Glucose, UA: NEGATIVE mg/dL
Ketones, ur: NEGATIVE mg/dL
Leukocytes,Ua: NEGATIVE
Nitrite: NEGATIVE
Protein, ur: NEGATIVE mg/dL
Specific Gravity, Urine: 1.011 (ref 1.005–1.030)
pH: 5 (ref 5.0–8.0)

## 2020-10-04 LAB — RESP PANEL BY RT-PCR (FLU A&B, COVID) ARPGX2
Influenza A by PCR: NEGATIVE
Influenza B by PCR: NEGATIVE
SARS Coronavirus 2 by RT PCR: NEGATIVE

## 2020-10-04 LAB — LACTIC ACID, PLASMA
Lactic Acid, Venous: 1 mmol/L (ref 0.5–1.9)
Lactic Acid, Venous: 1.1 mmol/L (ref 0.5–1.9)

## 2020-10-04 IMAGING — CT CT ANGIO CHEST
2 of 7 series · 15 of 46 positions shown · IV contrast (APPLIED)
Comparison: CT [DATE], CT biopsy [DATE], chest radiograph
[DATE]

CLINICAL DATA: PE suspected

EXAM:
CT ANGIOGRAPHY CHEST WITH CONTRAST
TECHNIQUE: Multidetector CT imaging of the chest was performed using the
standard protocol during bolus administration of intravenous
contrast. Multiplanar CT image reconstructions and MIPs were
obtained to evaluate the vascular anatomy.
CONTRAST:  100mL OMNIPAQUE IOHEXOL 350 MG/ML SOLN

[Series 7: cor · coronal · 0.56mm/px · 3 of 163 slices shown]
[im 41/163  soft-tissue]
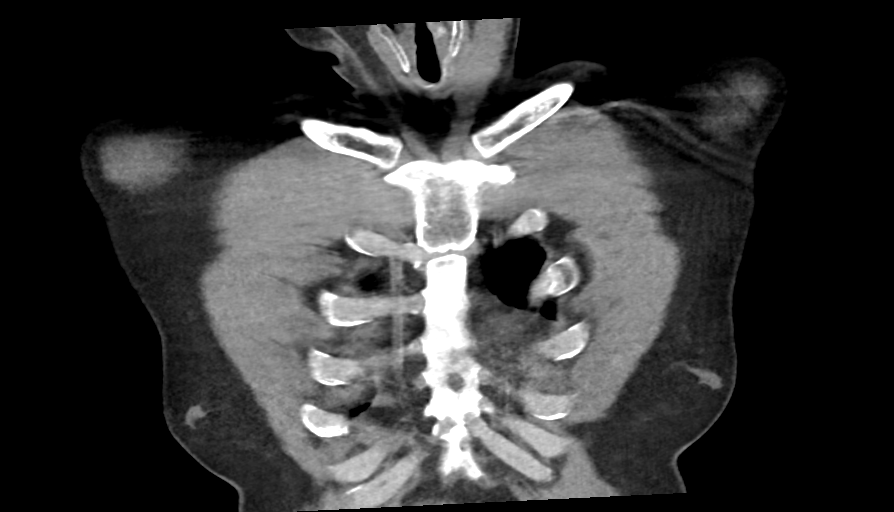
[im 82/163  soft-tissue]
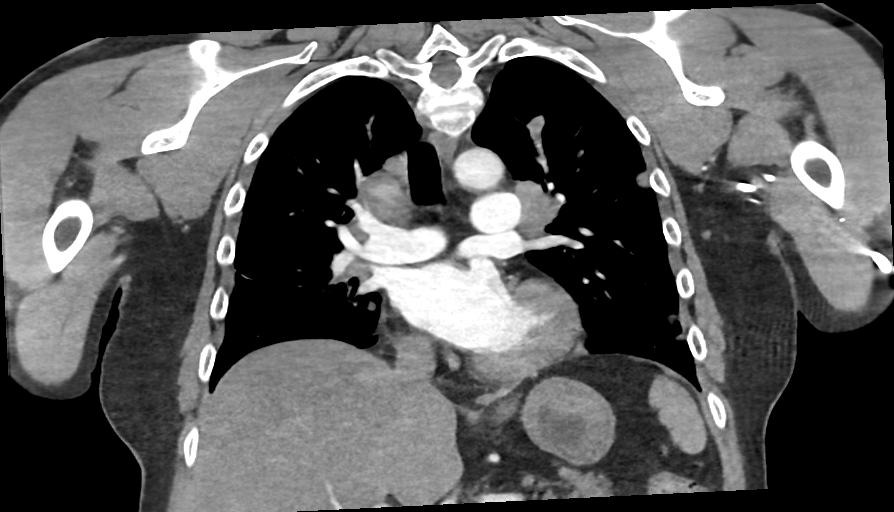
[im 122/163  soft-tissue]
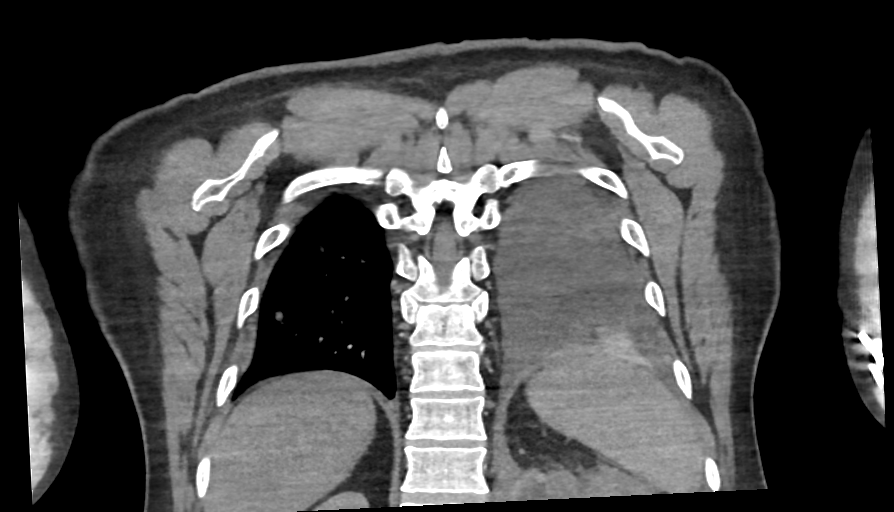

[Series 8: thins · axial · 0.75mm/px · z∈[+1206,+1447]mm · 12 of 388 slices shown]
[im 22/388  lung]
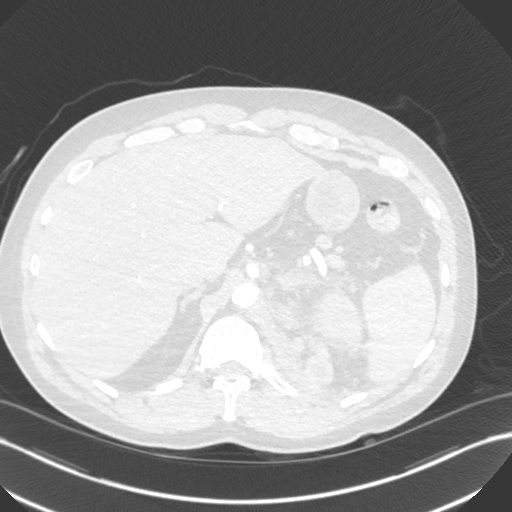
[im 65/388  soft-tissue]
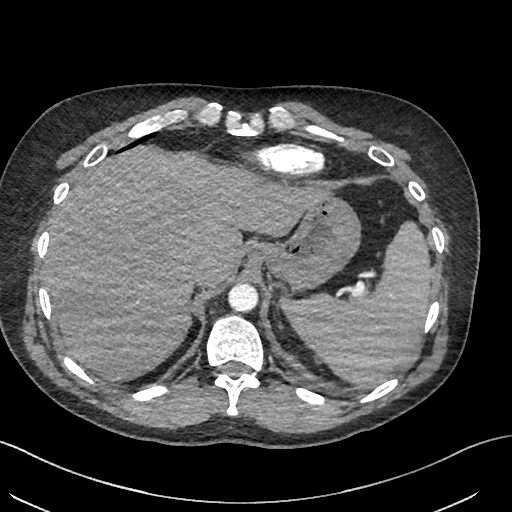
[im 87/388  lung]
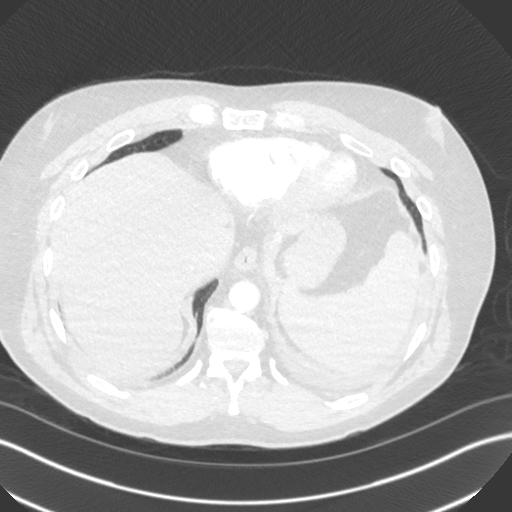
[im 108/388  soft-tissue]
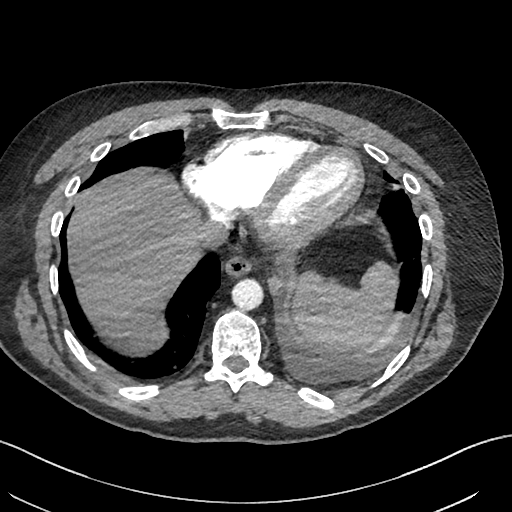
[im 151/388  lung]
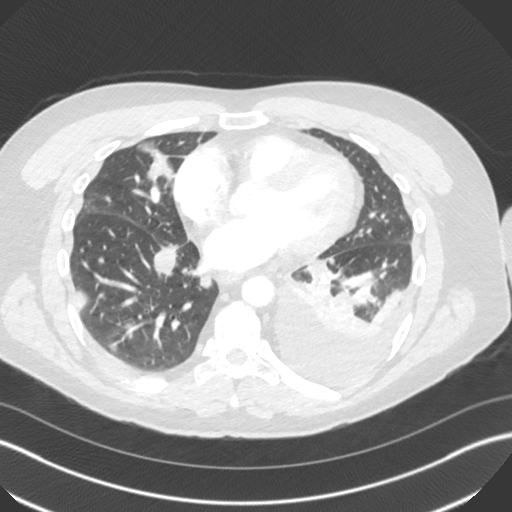
[im 173/388  soft-tissue]
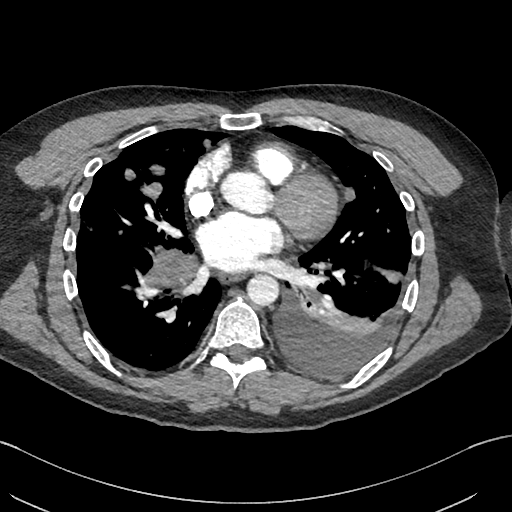
[im 216/388  lung]
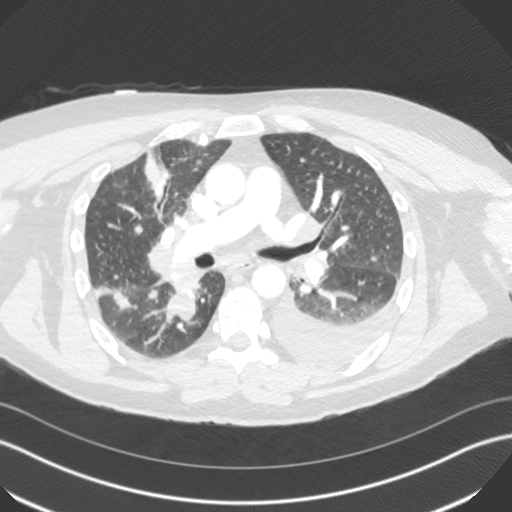
[im 237/388  soft-tissue]
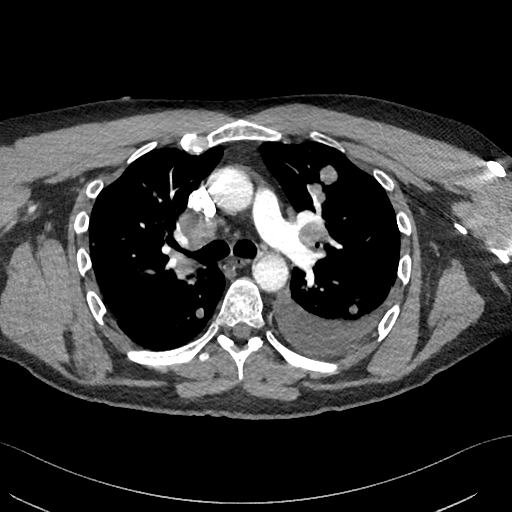
[im 280/388  lung]
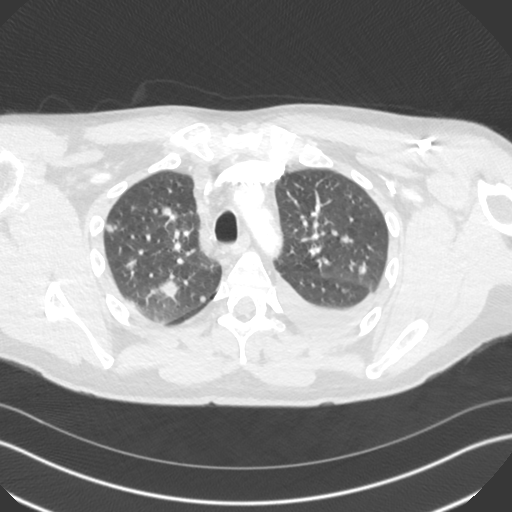
[im 302/388  soft-tissue]
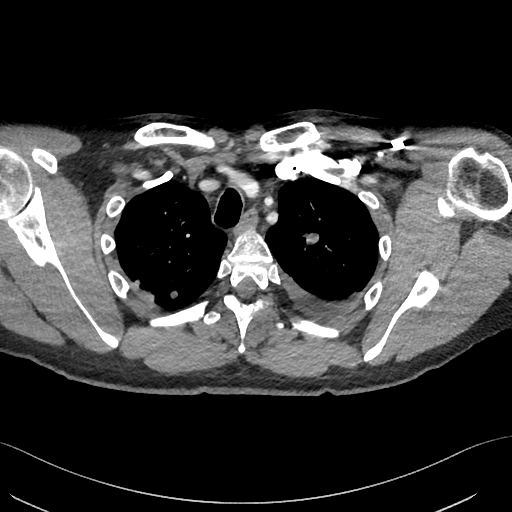
[im 323/388  lung]
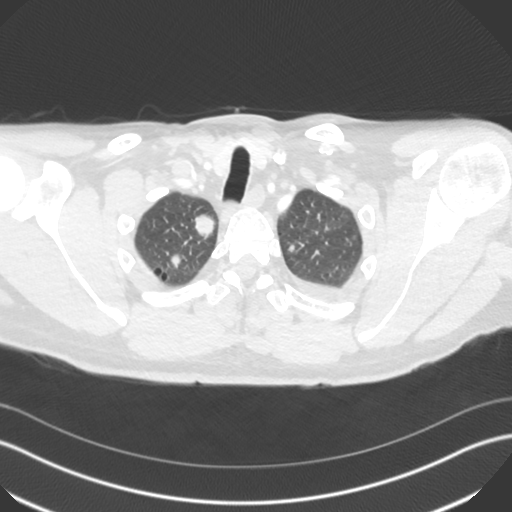
[im 366/388  soft-tissue]
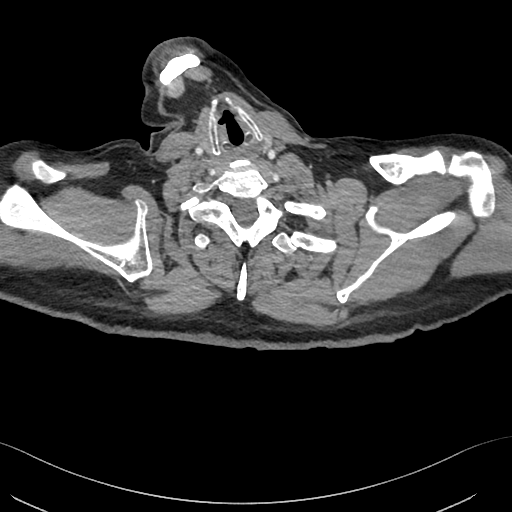

[15 of 46 positions shown; findings below may reference images not displayed]

FINDINGS: Cardiovascular: Satisfactory opacification of pulmonary arteries to
the segmental level. There is significant narrowing of the distal
main and central lobar pulmonary arteries in the bilateral hila
secondary to the large confluent hilar nodal conglomerate. No
convincing pulmonary artery emboli are identified. Cardiac size is
top normal. No pericardial effusion. The aortic root is suboptimally
assessed given cardiac pulsation artifact. The aorta is normal
caliber. No acute luminal abnormality of the imaged aorta. No
periaortic stranding or hemorrhage. Shared origin of the
brachiocephalic and left common carotid arteries. Proximal great
vessels are otherwise unremarkable. No major venous abnormalities.

Mediastinum/Nodes: Significantly increased size of the bulky hilar
nodal conglomerate size with resulting narrowing of the hilar
vessels and airways. On the right this measures approximately 4.1 x
7.2 x 6.1 cm in conglomerate size separate nodal deposits are
present in the left hilum measuring up to 2.0 cm superiorly and
cm inferiorly. No free mediastinal fluid or gas. Thyroid gland and
thoracic inlet are unremarkable. No axillary adenopathy.

Lungs/Pleura: New large left pleural effusion, possibly malignant.
Atelectatic collapse the left lower lobe with several fluid-filled
airways beyond a region obstruction secondary to the hilar nodal
conglomerate. Some underlying postobstructive pneumonia/infectious
process is not excluded. Additional narrowed and obstructed airways
are noted throughout the right lung as well with further
atelectasis. Background of paraseptal emphysematous changes in the
lungs. Numerous rounded metastatic lesions are seen throughout the
lung parenchyma bilaterally significantly increased in number and
size from comparison imaging [DATE]. No pneumothorax.

Upper Abdomen: Increasingly conspicuous thickening of the left
adrenal gland. Large heterogeneous mass arising from the upper pole
left kidney, possibly increased from prior though incompletely
visualized and characterized on this nondedicated exam. Extensive
surrounding stranding and inflammatory changes.

Musculoskeletal: Increasing size of a expansile lytic lesion in the
posterior right ninth rib measuring up to 2.8 x 1.3 cm with soft
tissue extension. Lytic lesion noted in the left humeral head
measuring 2.7 by 1.7 cm in size with mild soft tissue extension as
well. No other concerning lytic or blastic lesions are evident

Review of the MIP images confirms the above findings.
IMPRESSION: 1. No evidence of acute pulmonary artery emboli.
2. Significantly worsened metastatic disease seen throughout the
chest and upper abdomen including a significant increase in size of
the bilateral bulky hilar nodal deposits resulting and narrowing of
the hilar vessels and airways.
3. Atelectatic collapse the left lower lobe with several
fluid-filled airways. Underlying postobstructive
pneumonia/infectious process is not excluded.
4. New large left pleural effusion, possibly malignant.
5. Numerous rounded metastatic lesions throughout the lung
parenchyma bilaterally significantly increased in number and size
from comparison.
6. Increasing size of the lytic metastatic lesions in the left
humeral head and right ninth rib.
7. Large heterogeneous mass arising from the upper pole left kidney,
possibly increased from prior though incompletely visualized and
characterized on this nondedicated exam.
8. Increasingly conspicuous thickening of the left adrenal gland,
concerning for metastatic disease as well.
9. Emphysema ([3Z]-[3Z]).

These results were called by telephone at the time of interpretation
on [DATE] at [DATE] to provider MAEDA , who verbally
acknowledged these results.

## 2020-10-04 MED ORDER — SODIUM CHLORIDE 0.9 % IV BOLUS
1000.0000 mL | Freq: Once | INTRAVENOUS | Status: AC
  Administered 2020-10-04: 1000 mL via INTRAVENOUS

## 2020-10-04 MED ORDER — ACETAMINOPHEN 500 MG PO TABS
1000.0000 mg | ORAL_TABLET | Freq: Once | ORAL | Status: AC
  Administered 2020-10-04: 1000 mg via ORAL
  Filled 2020-10-04: qty 2

## 2020-10-04 MED ORDER — IOHEXOL 350 MG/ML SOLN
100.0000 mL | Freq: Once | INTRAVENOUS | Status: AC | PRN
  Administered 2020-10-04: 100 mL via INTRAVENOUS

## 2020-10-04 NOTE — ED Notes (Signed)
Patient transported to CT 

## 2020-10-05 LAB — URINE CULTURE: Culture: NO GROWTH

## 2020-10-07 ENCOUNTER — Telehealth: Payer: Self-pay

## 2020-10-07 NOTE — Telephone Encounter (Signed)
Received call from patient reporting itching, rash and redness to his arms, thighs, body and that it started during the night to now. Has denies taking anything for it. Also, he was coughing so hard it was difficult make out what he was saying. He blamed the cough on his heavy smoking and that it comes and goes especially when he is trying to talk.   Per Dr. Alen Blew, please instruct him to take Benadryl for his itching.  He is also to pick up hydrocortisone cream over-the-counter and apply to his rash.  If his shortness of breath gets worse, he needs to go back to the emergency department.  Patient was agreeable and verbalized understanding.

## 2020-10-09 ENCOUNTER — Encounter: Payer: Self-pay | Admitting: Oncology

## 2020-10-09 LAB — CULTURE, BLOOD (ROUTINE X 2)
Culture: NO GROWTH
Culture: NO GROWTH

## 2020-10-09 NOTE — Progress Notes (Signed)
Called pt to introduce myself as his Financial Resource Specialist, discuss copay assistance and the [employer] grant.  I left a msg requesting he return my call if he would like to apply for the grants.  

## 2020-10-09 NOTE — Progress Notes (Signed)
Pt returned my call so we discussed copay assistance for [facility].  Pt would like to apply and requested I email him the application which I did.  He will bring the completed application on 11/02/50.  Once received I will fax to [employer] for processing.  I will also give him an expense sheet for the [employer] and my card for any questions or concerns he may have in the future.

## 2020-10-14 ENCOUNTER — Emergency Department (HOSPITAL_COMMUNITY): Payer: 59

## 2020-10-14 ENCOUNTER — Encounter (HOSPITAL_COMMUNITY): Payer: Self-pay | Admitting: Emergency Medicine

## 2020-10-14 ENCOUNTER — Observation Stay (HOSPITAL_COMMUNITY)
Admission: EM | Admit: 2020-10-14 | Discharge: 2020-10-15 | Disposition: A | Discharge status: Home / Self Care | Payer: 59 | Attending: Family Medicine | Admitting: Family Medicine

## 2020-10-14 ENCOUNTER — Other Ambulatory Visit: Payer: Self-pay

## 2020-10-14 ENCOUNTER — Observation Stay (HOSPITAL_COMMUNITY): Payer: 59

## 2020-10-14 DIAGNOSIS — Z20822 Contact with and (suspected) exposure to covid-19: Secondary | ICD-10-CM | POA: Diagnosis not present

## 2020-10-14 DIAGNOSIS — Z9889 Other specified postprocedural states: Secondary | ICD-10-CM

## 2020-10-14 DIAGNOSIS — C642 Malignant neoplasm of left kidney, except renal pelvis: Secondary | ICD-10-CM | POA: Diagnosis not present

## 2020-10-14 DIAGNOSIS — R0789 Other chest pain: Secondary | ICD-10-CM | POA: Diagnosis present

## 2020-10-14 DIAGNOSIS — J90 Pleural effusion, not elsewhere classified: Principal | ICD-10-CM | POA: Diagnosis present

## 2020-10-14 DIAGNOSIS — Z79899 Other long term (current) drug therapy: Secondary | ICD-10-CM | POA: Diagnosis not present

## 2020-10-14 DIAGNOSIS — F17200 Nicotine dependence, unspecified, uncomplicated: Secondary | ICD-10-CM | POA: Diagnosis not present

## 2020-10-14 DIAGNOSIS — I639 Cerebral infarction, unspecified: Secondary | ICD-10-CM | POA: Diagnosis not present

## 2020-10-14 DIAGNOSIS — C7802 Secondary malignant neoplasm of left lung: Secondary | ICD-10-CM | POA: Insufficient documentation

## 2020-10-14 DIAGNOSIS — R0609 Other forms of dyspnea: Secondary | ICD-10-CM | POA: Insufficient documentation

## 2020-10-14 DIAGNOSIS — I10 Essential (primary) hypertension: Secondary | ICD-10-CM | POA: Diagnosis present

## 2020-10-14 LAB — BODY FLUID CELL COUNT WITH DIFFERENTIAL
Eos, Fluid: 0 %
Lymphs, Fluid: 79 %
Monocyte-Macrophage-Serous Fluid: 18 % — ABNORMAL LOW (ref 50–90)
Neutrophil Count, Fluid: 3 % (ref 0–25)
Total Nucleated Cell Count, Fluid: 3518 cu mm — ABNORMAL HIGH (ref 0–1000)

## 2020-10-14 LAB — HEPATIC FUNCTION PANEL
ALT: 50 U/L — ABNORMAL HIGH (ref 0–44)
AST: 28 U/L (ref 15–41)
Albumin: 3.3 g/dL — ABNORMAL LOW (ref 3.5–5.0)
Alkaline Phosphatase: 86 U/L (ref 38–126)
Bilirubin, Direct: <0.1 mg/dL (ref 0.0–0.2)
Total Bilirubin: 0.8 mg/dL (ref 0.3–1.2)
Total Protein: 6.9 g/dL (ref 6.5–8.1)

## 2020-10-14 LAB — RESP PANEL BY RT-PCR (FLU A&B, COVID) ARPGX2
Influenza A by PCR: NEGATIVE
Influenza B by PCR: NEGATIVE
SARS Coronavirus 2 by RT PCR: NEGATIVE

## 2020-10-14 LAB — BASIC METABOLIC PANEL
Anion gap: 11 (ref 5–15)
BUN: 20 mg/dL (ref 6–20)
CO2: 24 mmol/L (ref 22–32)
Calcium: 8.6 mg/dL — ABNORMAL LOW (ref 8.9–10.3)
Chloride: 101 mmol/L (ref 98–111)
Creatinine, Ser: 1.29 mg/dL — ABNORMAL HIGH (ref 0.61–1.24)
GFR, Estimated: >60 mL/min (ref >60)
Glucose, Bld: 112 mg/dL — ABNORMAL HIGH (ref 70–99)
Potassium: 4 mmol/L (ref 3.5–5.1)
Sodium: 136 mmol/L (ref 135–145)

## 2020-10-14 LAB — CBC
HCT: 32.1 % — ABNORMAL LOW (ref 39.0–52.0)
Hemoglobin: 10.3 g/dL — ABNORMAL LOW (ref 13.0–17.0)
MCH: 29.3 pg (ref 26.0–34.0)
MCHC: 32.1 g/dL (ref 30.0–36.0)
MCV: 91.2 fL (ref 80.0–100.0)
Platelets: 254 10*3/uL (ref 150–400)
RBC: 3.52 MIL/uL — ABNORMAL LOW (ref 4.22–5.81)
RDW: 14 % (ref 11.5–15.5)
WBC: 7.2 10*3/uL (ref 4.0–10.5)
nRBC: 0 % (ref 0.0–0.2)

## 2020-10-14 LAB — LACTATE DEHYDROGENASE, PLEURAL OR PERITONEAL FLUID: LD, Fluid: 499 U/L — ABNORMAL HIGH (ref 3–23)

## 2020-10-14 LAB — PROTEIN, PLEURAL OR PERITONEAL FLUID: Total protein, fluid: 4.6 g/dL

## 2020-10-14 LAB — BRAIN NATRIURETIC PEPTIDE: B Natriuretic Peptide: 27.9 pg/mL (ref 0.0–100.0)

## 2020-10-14 LAB — TROPONIN I (HIGH SENSITIVITY)
Troponin I (High Sensitivity): 4 ng/L (ref <18)
Troponin I (High Sensitivity): 3 ng/L (ref <18)

## 2020-10-14 LAB — LACTATE DEHYDROGENASE: LDH: 509 U/L — ABNORMAL HIGH (ref 98–192)

## 2020-10-14 LAB — LDL CHOLESTEROL, DIRECT: Direct LDL: 99.4 mg/dL — ABNORMAL HIGH (ref 0–99)

## 2020-10-14 IMAGING — US US THORACENTESIS ASP PLEURAL SPACE W/IMG GUIDE
1 series · 3 of 3 positions shown · non-contrast
Comparison: none

INDICATION: Patient with metastatic renal cell carcinoma, left pleural effusion.
Request is made for diagnostic and therapeutic thoracentesis.

[Series 1: us thoracentesis asp pleural space w/img guide · 3 of 3 slices shown]
[im 1/3]
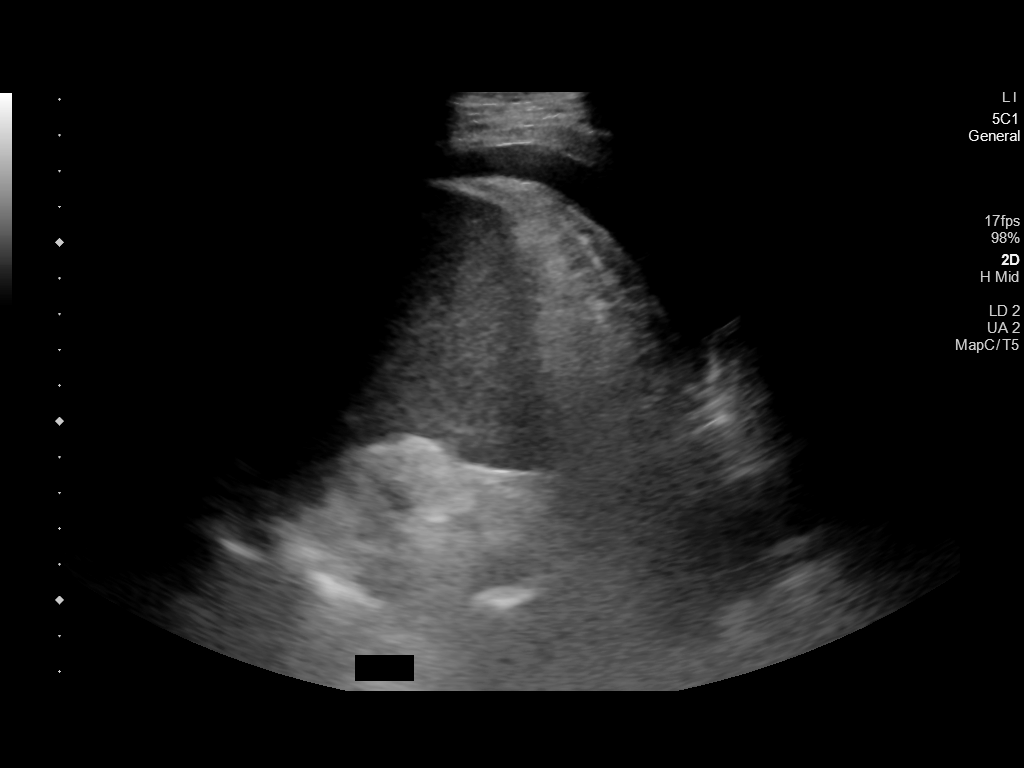
[im 2/3]
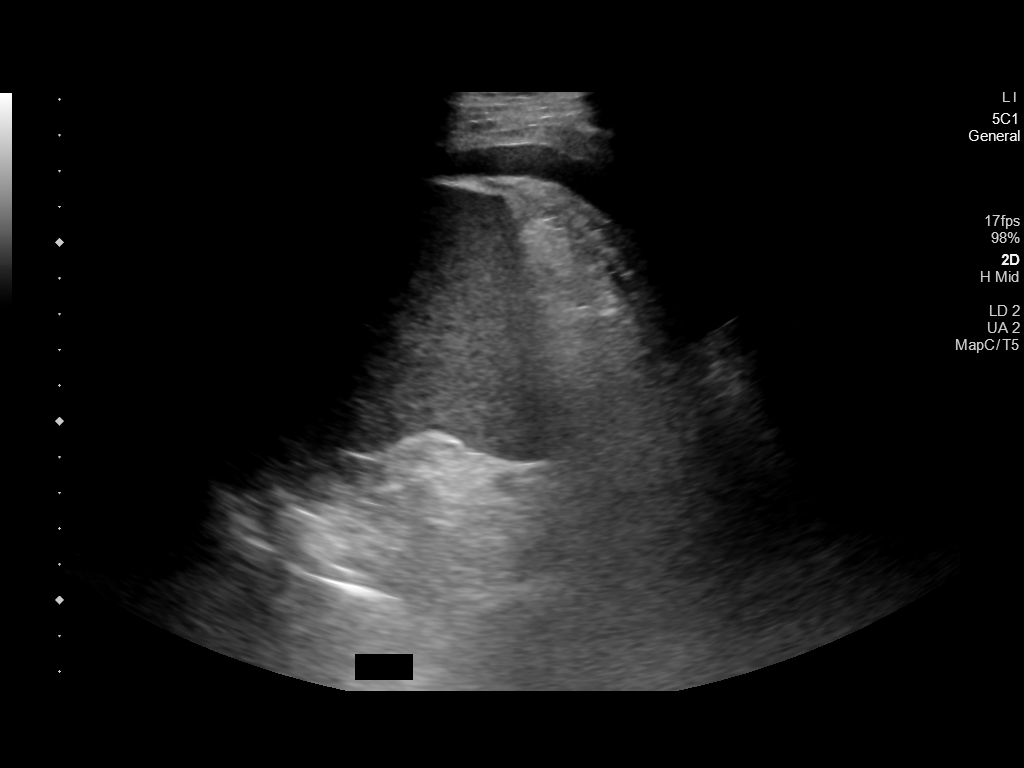
[im 3/3]
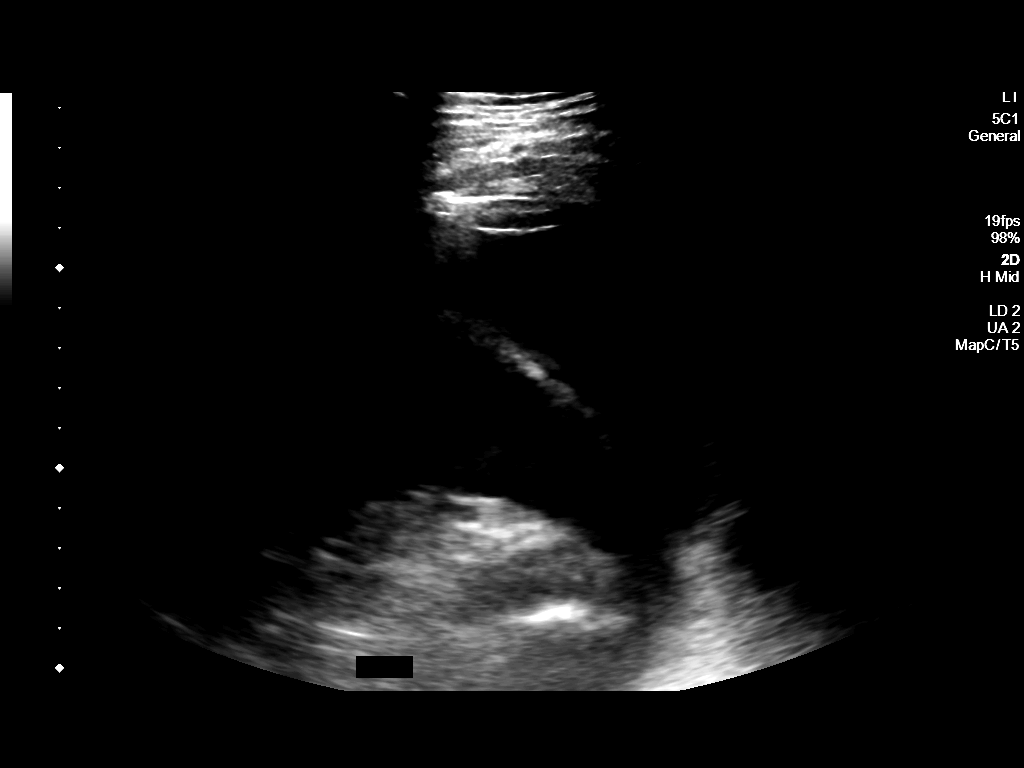

[3 of 3 positions shown; findings below may reference images not displayed]

EXAM:
ULTRASOUND GUIDED DIAGNOSTIC AND THERAPEUTIC LEFT THORACENTESIS

MEDICATIONS:
10 mL 1% lidocaine

COMPLICATIONS:
None immediate.

PROCEDURE:
An ultrasound guided thoracentesis was thoroughly discussed with the
patient and questions answered. The benefits, risks, alternatives
and complications were also discussed. The patient understands and
wishes to proceed with the procedure. Written consent was obtained.

Ultrasound was performed to localize and mark an adequate pocket of
fluid in the left chest. The area was then prepped and draped in the
normal sterile fashion. 1% Lidocaine was used for local anesthesia.
Under ultrasound guidance a 6 Fr Safe-T-Centesis catheter was
introduced. Thoracentesis was performed, however prior to removal of
all fluid patient complained of dizziness, lightheadedness, stated
"I am going to pass out." The catheter was removed and a dressing
applied. Vital signs remained stable throughout.
FINDINGS: A total of approximately 120 mL of amber fluid was removed. Samples
were sent to the laboratory as requested by the clinical team.
IMPRESSION: Successful ultrasound guided diagnostic and therapeutic left
thoracentesis yielding 120 mL of pleural fluid.

## 2020-10-14 IMAGING — CT CT ANGIO CHEST
3 of 7 series · 18 of 36 positions shown · IV contrast (omnipaque)
Comparison: Ten days ago

CLINICAL DATA: Chest pressure.

EXAM:
CT ANGIOGRAPHY CHEST WITH CONTRAST
TECHNIQUE: Multidetector CT imaging of the chest was performed using the
standard protocol during bolus administration of intravenous
contrast. Multiplanar CT image reconstructions and MIPs were
obtained to evaluate the vascular anatomy.
CONTRAST:  70mL OMNIPAQUE IOHEXOL 350 MG/ML SOLN

[Series 5: thins · axial · 0.70mm/px · z∈[+1358,+1592]mm · 12 of 277 slices shown]
[im 22/277  lung]
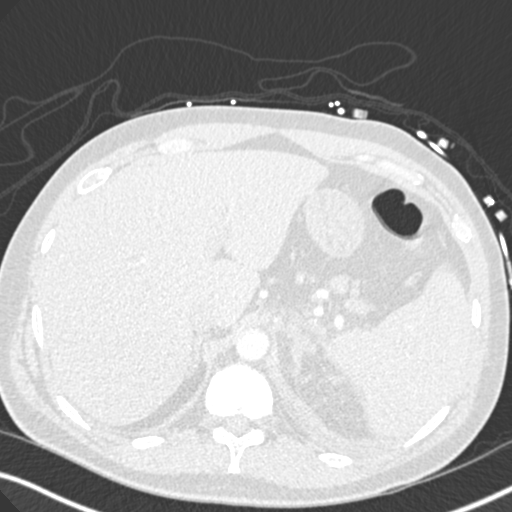
[im 43/277  mediastinal]
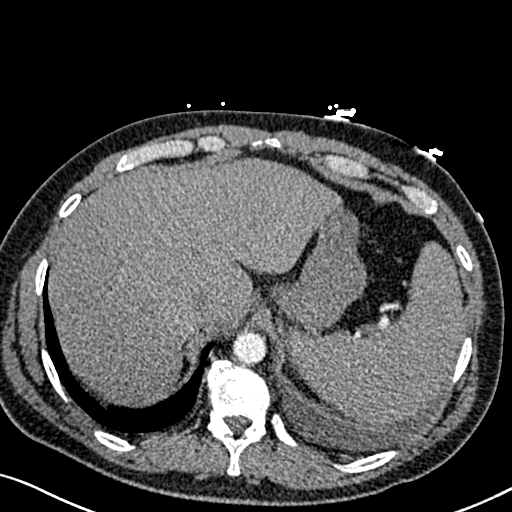
[im 64/277  lung]
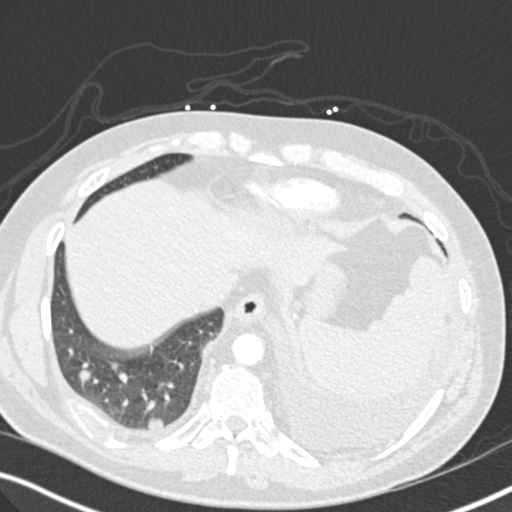
[im 85/277  mediastinal]
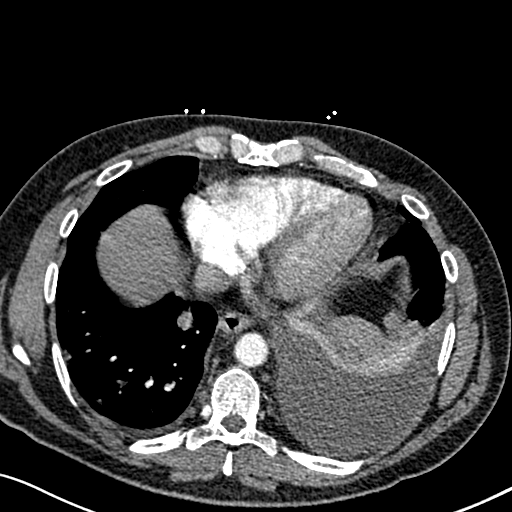
[im 107/277  lung]
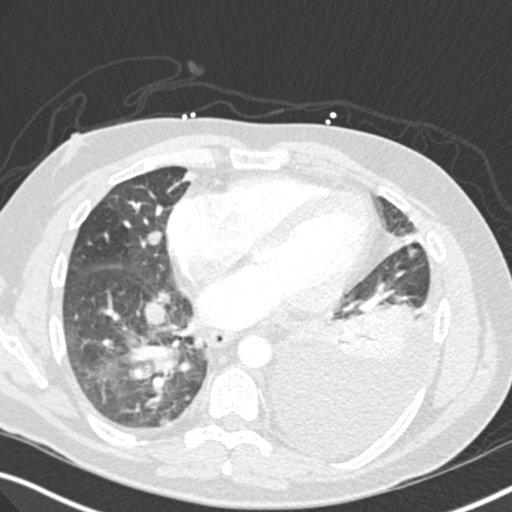
[im 128/277  mediastinal]
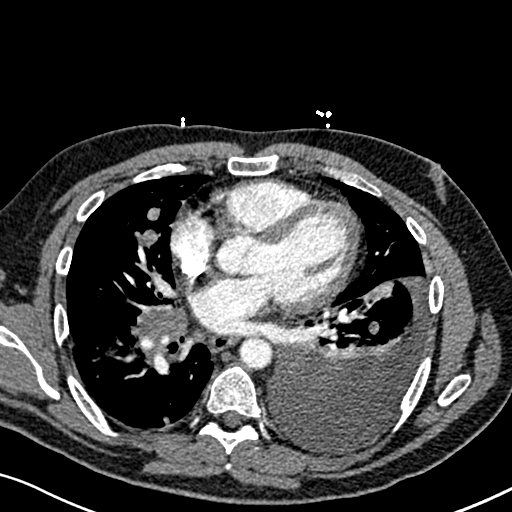
[im 149/277  lung]
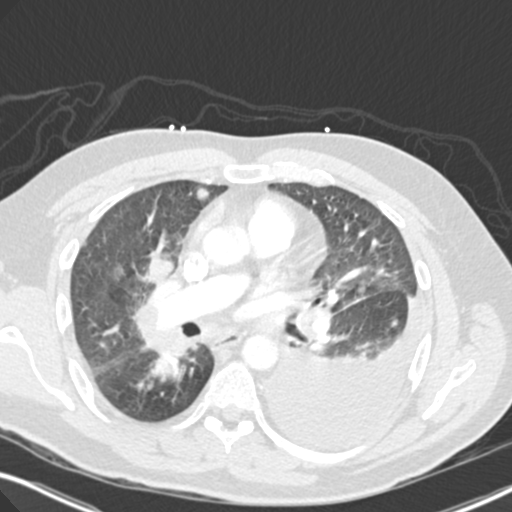
[im 170/277  mediastinal]
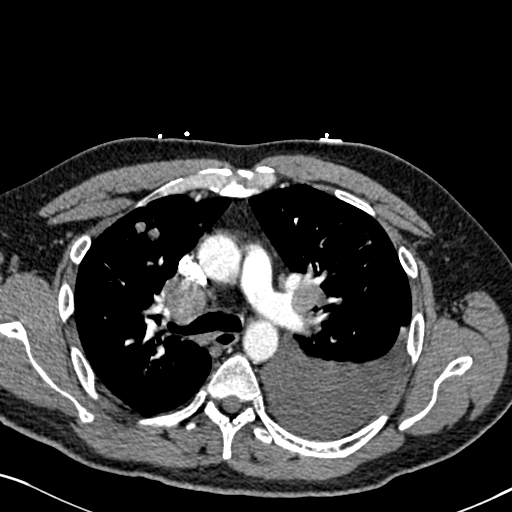
[im 192/277  lung]
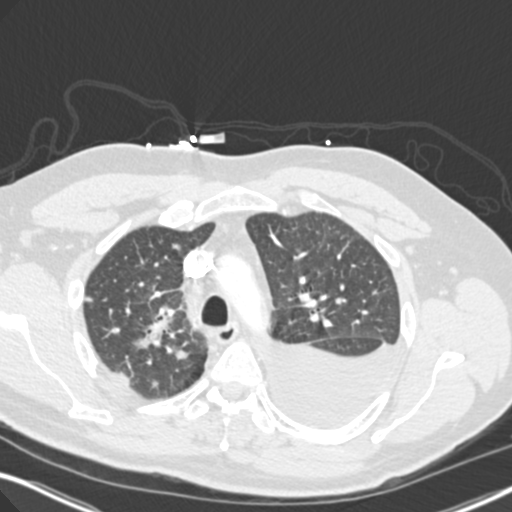
[im 213/277  mediastinal]
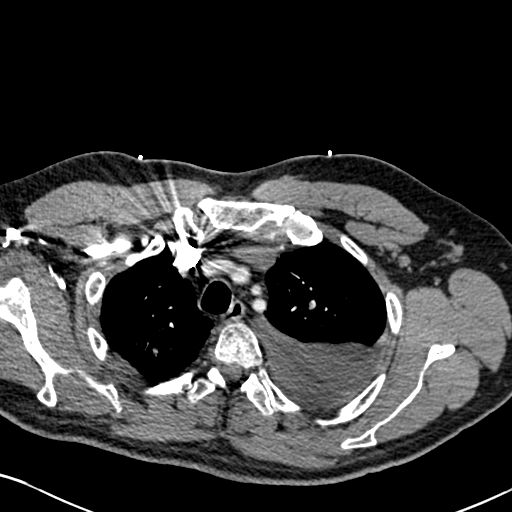
[im 234/277  lung]
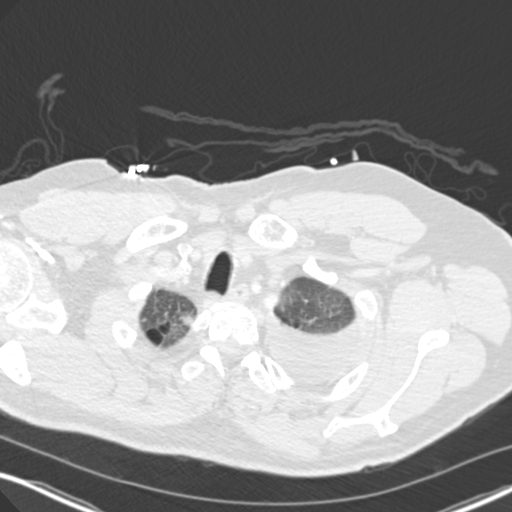
[im 255/277  mediastinal]
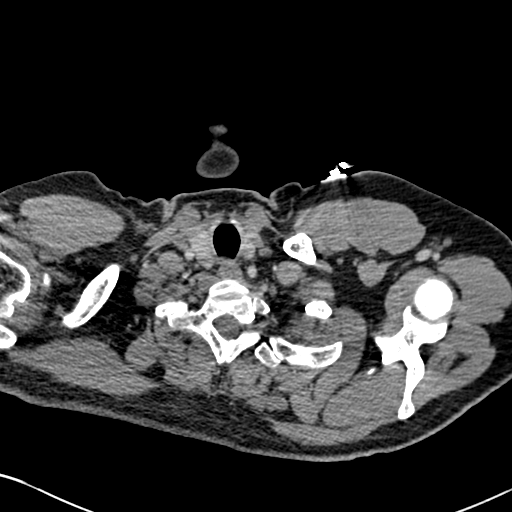

[Series 6: lung · axial · 0.70mm/px · z∈[+1404,+1572]mm · 5 of 127 slices shown]
[im 22/127  mediastinal]
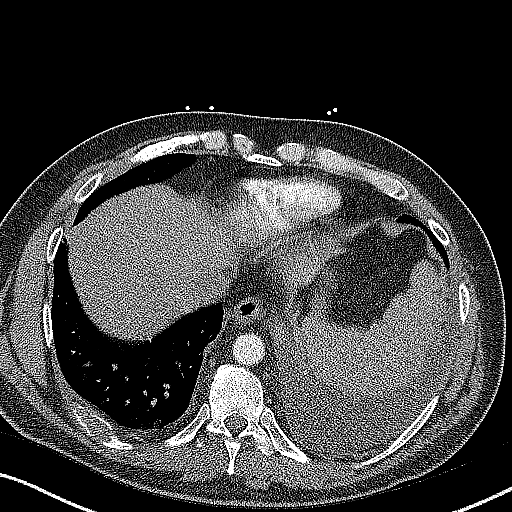
[im 43/127  mediastinal]
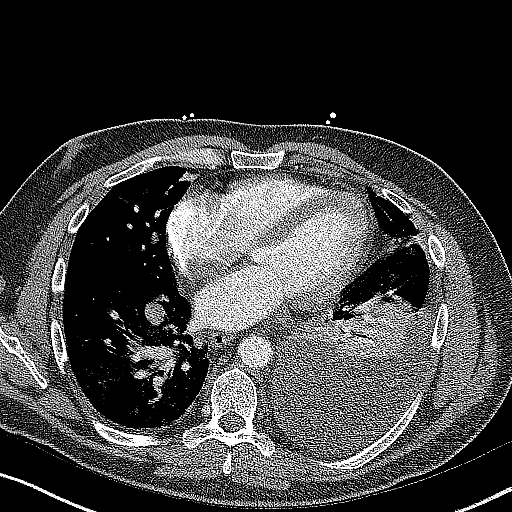
[im 64/127  mediastinal]
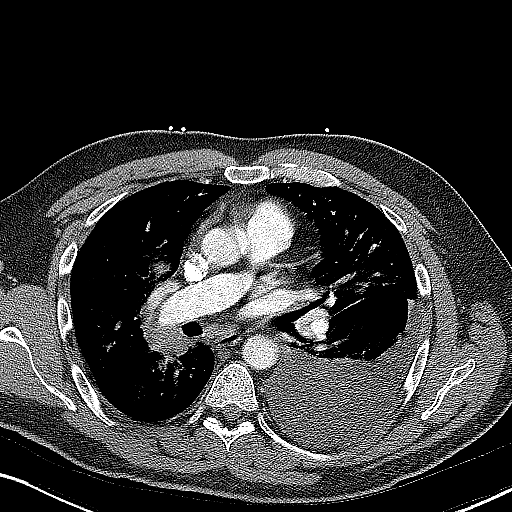
[im 85/127  mediastinal]
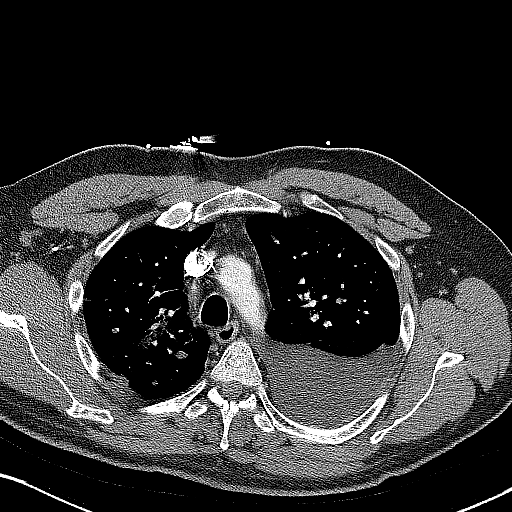
[im 106/127  mediastinal]
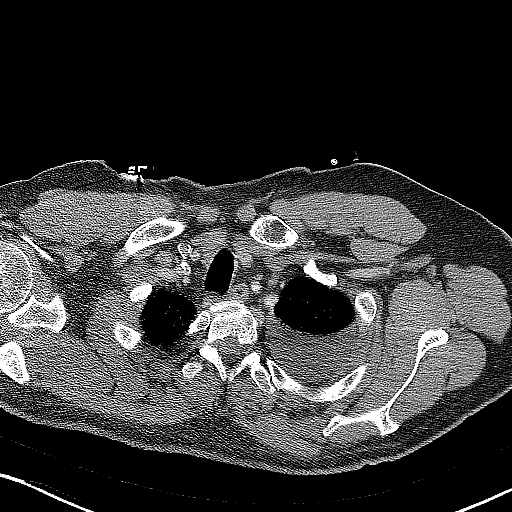

[Series 7: coronal mpr · coronal · 0.54mm/px · 1 of 129 slices shown]
[im 65/129  mediastinal]
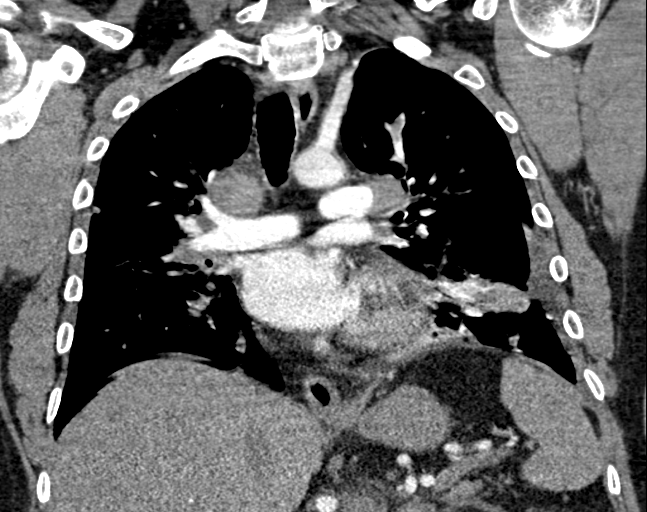

[18 of 36 positions shown; findings below may reference images not displayed]

FINDINGS: Cardiovascular: Normal heart size. No pericardial effusion. Central
vessels are distorted by pulmonary and nodal metastatic disease.
Apparent filling defect within apical segment left upper lobe
pulmonary arteries is stable from before and expanded, more
consistent with metastatic disease than pulmonary embolism.

Mediastinum/Nodes: Hilar and mediastinal nodal metastases which are
bulky in narrow adjacent airways. A right paratracheal node measures
up to 2.7 cm.

Lungs/Pleura: Innumerable pulmonary metastases randomly distributed
in the bilateral lungs. Large and increased left pleural effusion
without discrete pleural nodule on this arterial phase study.
Expected left lower lobe atelectasis. No edema or air leak. Patchy
areas of air trapping likely from bronchial narrowing.

Upper Abdomen: Partially covered bulky left renal mass with
extracapsular extension.

Musculoskeletal: Large posterior right ninth rib metastasis with
transcortical and extraosseous growth. Well-defined proximal left
humeral metastasis.

Review of the MIP images confirms the above findings.
IMPRESSION: 1. Large and enlarging left pleural effusion with compared to study
10 days ago.
2. Negative for acute pulmonary embolism.
3. Widespread metastatic disease.

## 2020-10-14 IMAGING — DX DG CHEST 1V PORT
1 series · 1 of 1 positions shown · non-contrast
Comparison: [DATE]

CLINICAL DATA: Post left thoracentesis

EXAM:
PORTABLE CHEST 1 VIEW

[chest ap]
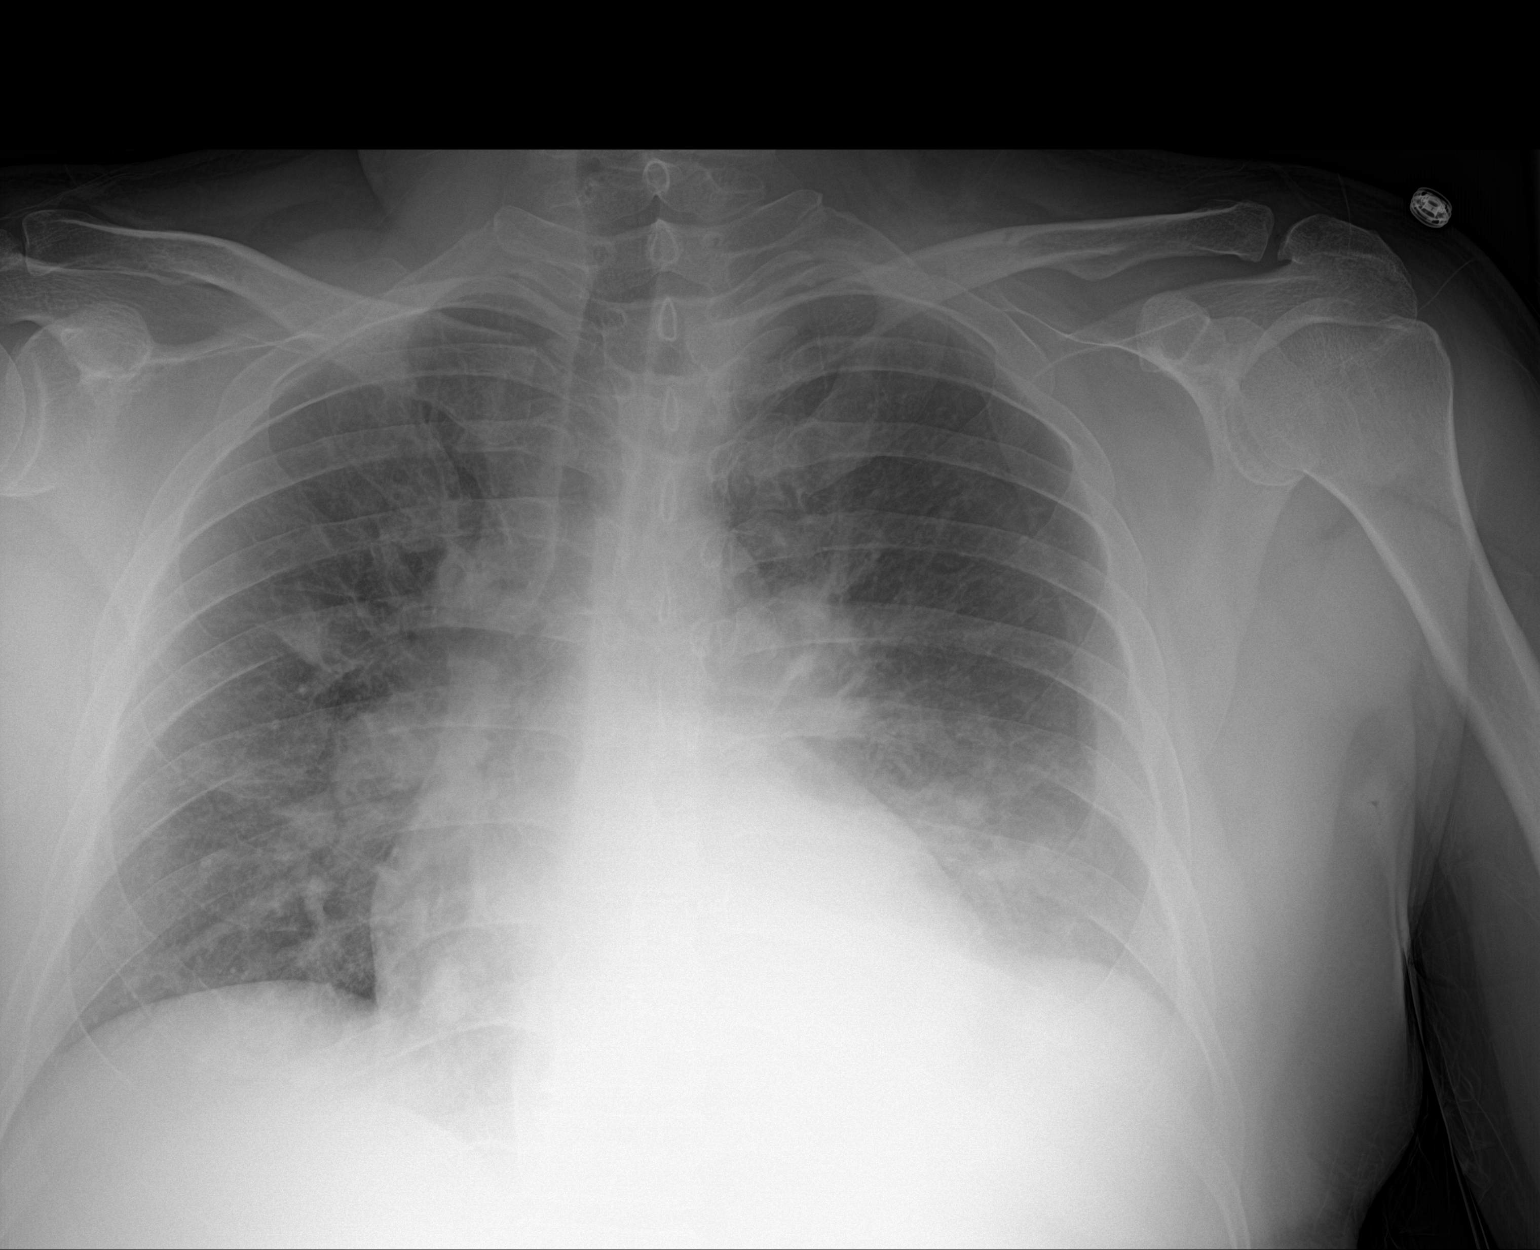

[1 of 1 positions shown; findings below may reference images not displayed]

FINDINGS: Decreased left pleural effusion with improved aeration at the left
lung base. Persistent bilateral pulmonary opacities. No
pneumothorax. Stable cardiomediastinal contours.
IMPRESSION: Decreased left pleural effusion post thoracentesis. No pneumothorax.
Otherwise persistent bilateral pulmonary opacities.

## 2020-10-14 IMAGING — MR MR HEAD WO/W CM
13 series · 48 of 48 positions shown · IV contrast (gadavist)
Comparison: None.

CLINICAL DATA: Renal cell carcinoma, staging

EXAM:
MRI HEAD WITHOUT AND WITH CONTRAST
TECHNIQUE: Multiplanar, multiecho pulse sequences of the brain and surrounding
structures were obtained without and with intravenous contrast.
CONTRAST:  9mL GADAVIST GADOBUTROL 1 MMOL/ML IV SOLN

[Series 5: DWI · axial · 3.0mm · 1.36mm/px · z∈[+6,+143]mm · 6 of 95 slices shown (1 of 2)]
[im 1/95]
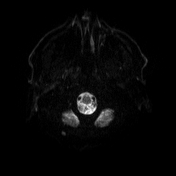
[im 19/95]
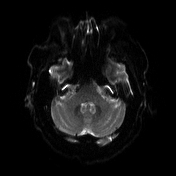
[im 38/95]
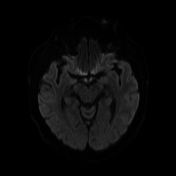
[im 57/95]
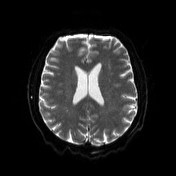
[im 76/95]
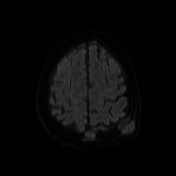
[im 95/95]
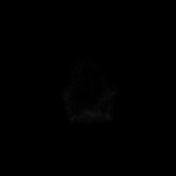

[Series 6: DWI · axial · 3.0mm · 1.36mm/px · z∈[+6,+143]mm · 4 of 48 slices shown (2 of 2)]
[im 1/48]
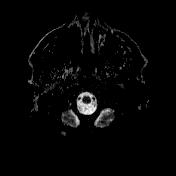
[im 16/48]
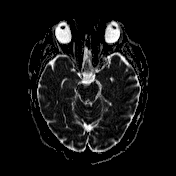
[im 32/48]
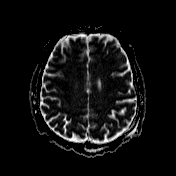
[im 48/48]
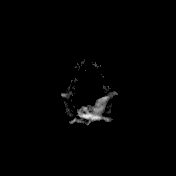

[Series 7: T2 · axial · 5.0mm · 0.62mm/px · 1 of 26 slices shown]
[im 1/26]
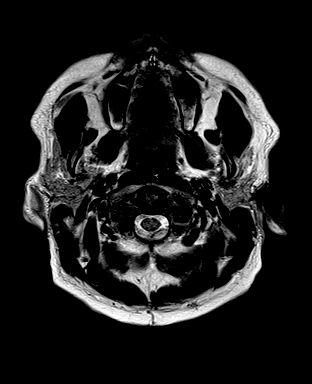

[Series 8: FLAIR · axial · 3.0mm · 0.75mm/px · z∈[-1,+148]mm · 3 of 52 slices shown]
[im 1/52]
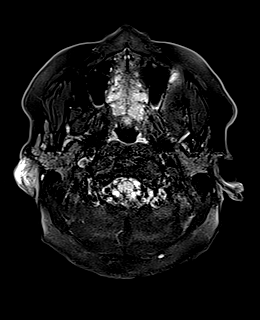
[im 26/52]
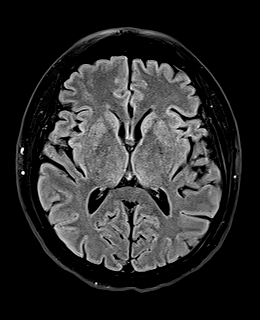
[im 52/52]
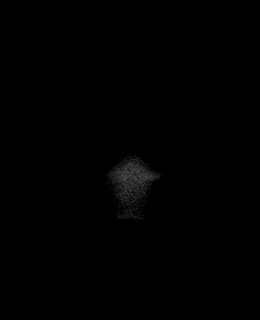

[Series 9: T1 · sagittal · 5.0mm · 0.75mm/px · 1 of 24 slices shown (1 of 2)]
[im 1/24]
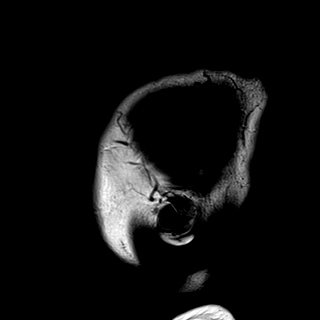

[Series 11: swi_images · axial · 3.0mm · 0.75mm/px · z∈[-1,+148]mm · 3 of 52 slices shown]
[im 1/52]
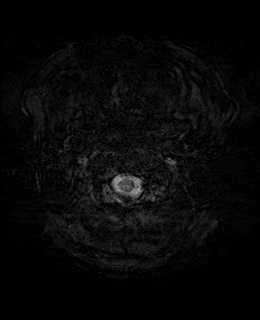
[im 26/52]
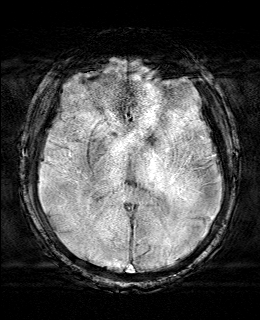
[im 52/52]
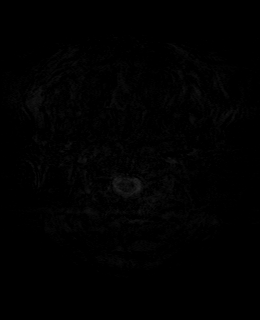

[Series 12: T1 · axial · 1.0mm · 0.94mm/px · z∈[+4,+143]mm · 8 of 144 slices shown (2 of 2)]
[im 1/144]
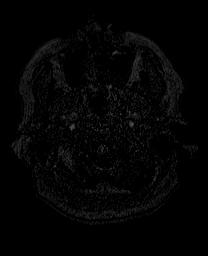
[im 21/144]
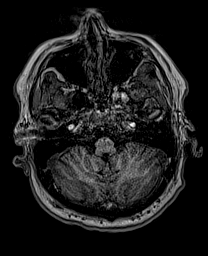
[im 41/144]
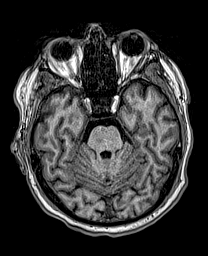
[im 62/144]
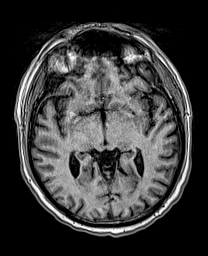
[im 82/144]
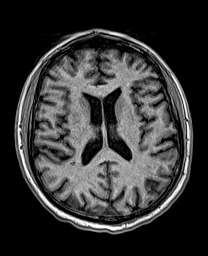
[im 103/144]
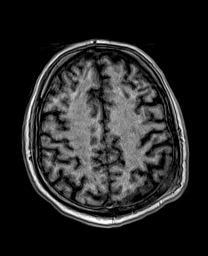
[im 123/144]
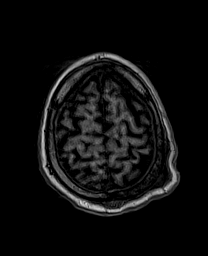
[im 144/144]
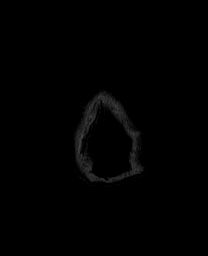

[Series 13: cor dwi_tracew · coronal · 5.0mm · 1.53mm/px · 3 of 52 slices shown]
[im 1/52]
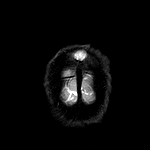
[im 26/52]
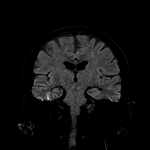
[im 52/52]
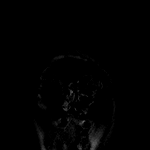

[Series 14: cor dwi_adc · coronal · 5.0mm · 1.53mm/px · 1 of 26 slices shown]
[im 1/26]
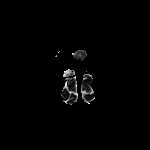

[Series 15: T1 post-contrast · axial · 1.0mm · 0.94mm/px · z∈[+4,+143]mm · 8 of 144 slices shown (1 of 3)]
[im 1/144]
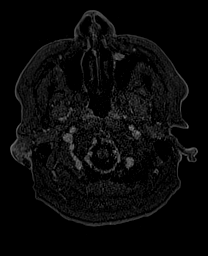
[im 21/144]
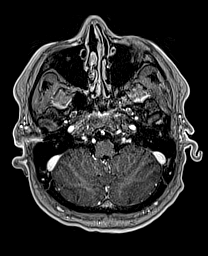
[im 41/144]
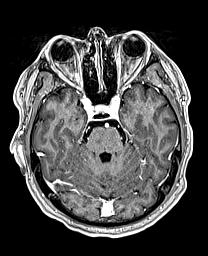
[im 62/144]
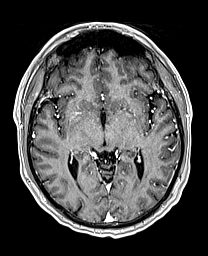
[im 82/144]
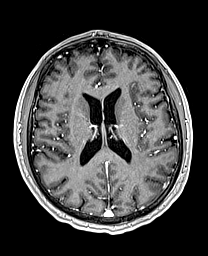
[im 103/144]
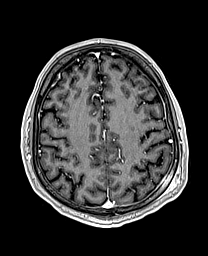
[im 123/144]
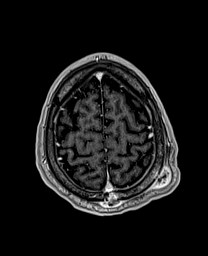
[im 144/144]
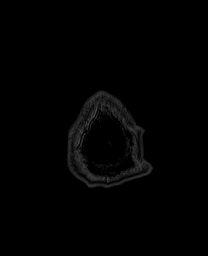

[Series 16: T1 post-contrast · coronal · 5.0mm · 0.47mm/px · 1 of 26 slices shown (2 of 3)]
[im 1/26]
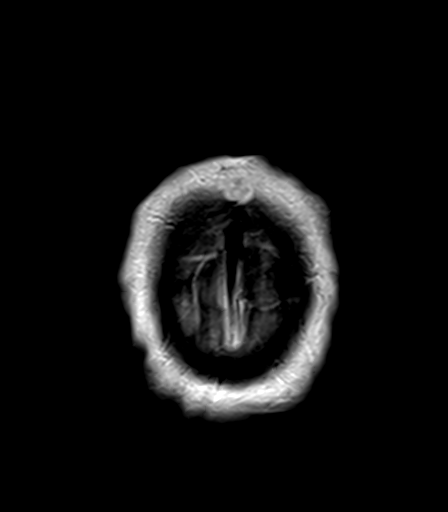

[Series 17: T1 post-contrast · sagittal · 5.0mm · 0.75mm/px · 1 of 24 slices shown (3 of 3)]
[im 1/24]
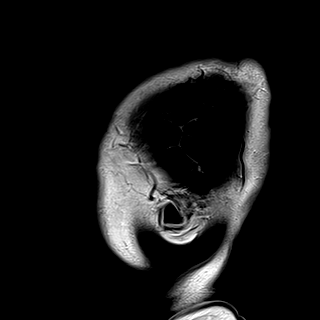

[Series 100: sub_[id number]_1 · axial · 1.0mm · 0.94mm/px · z∈[+4,+143]mm · 8 of 144 slices shown]
[im 1/144]
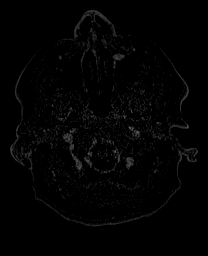
[im 21/144]
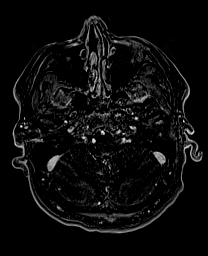
[im 41/144]
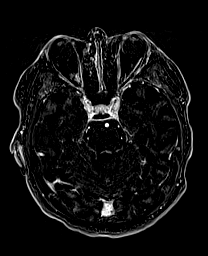
[im 62/144]
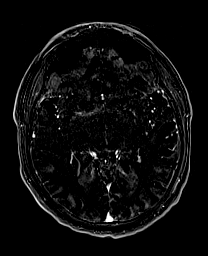
[im 82/144]
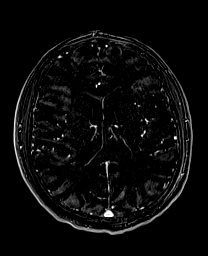
[im 103/144]
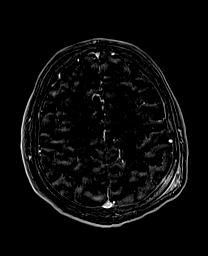
[im 123/144]
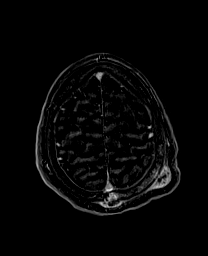
[im 144/144]
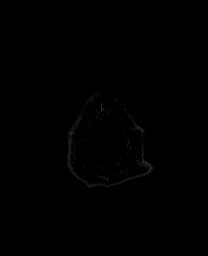

[48 of 48 positions shown; findings below may reference images not displayed]

FINDINGS: Brain: 2 small areas of restricted diffusion in the left mid frontal
white matter. These do not enhance and likely are related to small
areas of acute infarction. Mild hyperintensity in the pons
bilaterally may represent chronic microvascular ischemia. No
enhancing metastatic disease in the brain. Ventricle size normal. No
intracranial hemorrhage or mass

Vascular: Normal arterial flow voids

Skull and upper cervical spine: 2 skull lesions are present
consistent with metastatic disease. 15 mm lesion in the posterior
parietal bone in the midline with extension intracranially and into
the scalp. 2 cm lesion left parietal bone which appears to extend
through the inner table of the skull and also into the scalp. No
other skull lesion identified.

Sinuses/Orbits: Paranasal sinuses clear.  Negative orbit

Other: None
IMPRESSION: Metastatic disease to the calvarium. Two lesions are present in the
parietal bone consistent metastatic disease. Both show early
intracranial but likely extradural extension.

No metastatic deposits in the brain

Small areas of restricted diffusion in the left frontal white matter
consistent with acute or subacute infarction.

## 2020-10-14 IMAGING — CR DG CHEST 2V
2 series · 2 of 2 positions shown · non-contrast
Comparison: [DATE], CT [DATE]

CLINICAL DATA: Chest pain

EXAM:
CHEST - 2 VIEW

[w chest pa]
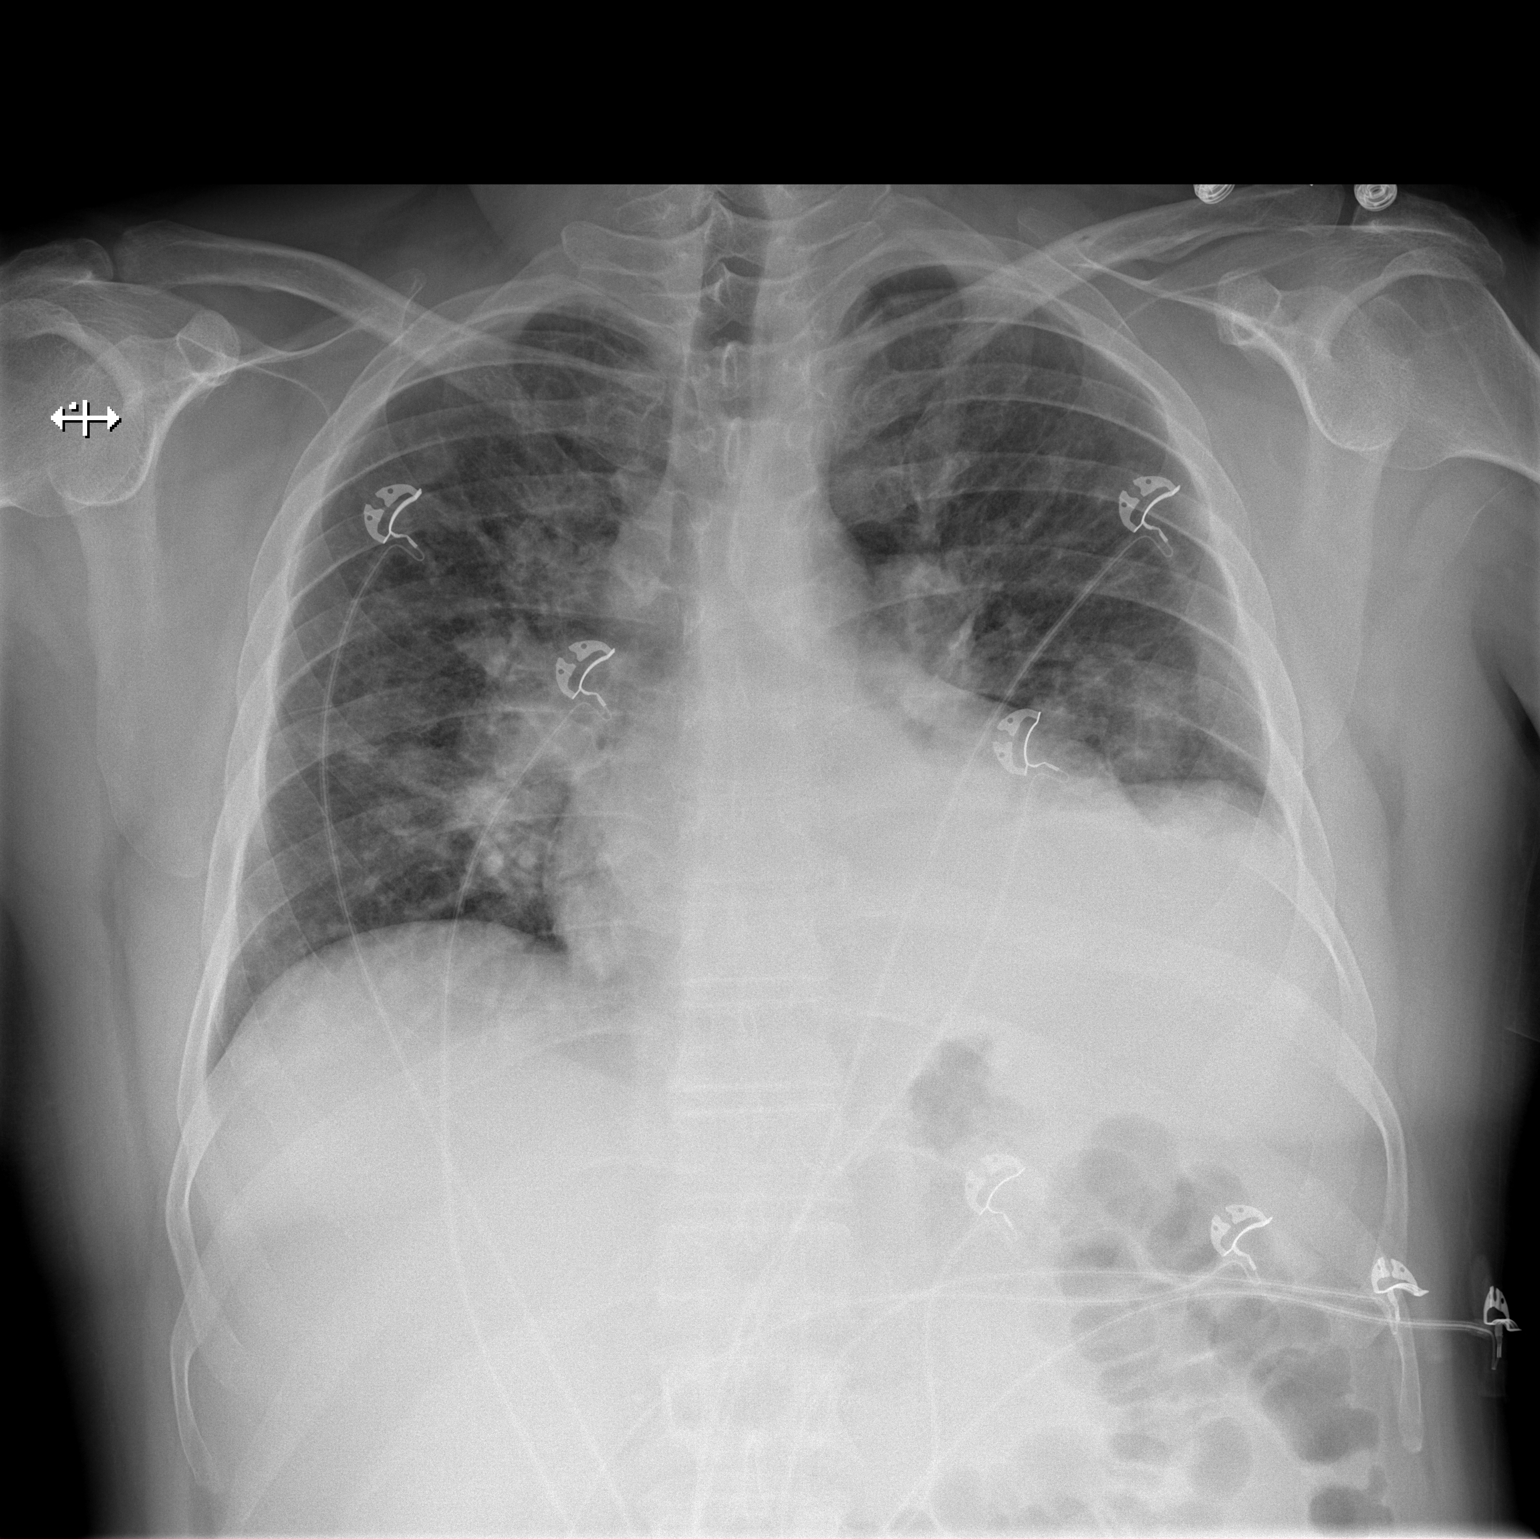

[w chest lat]
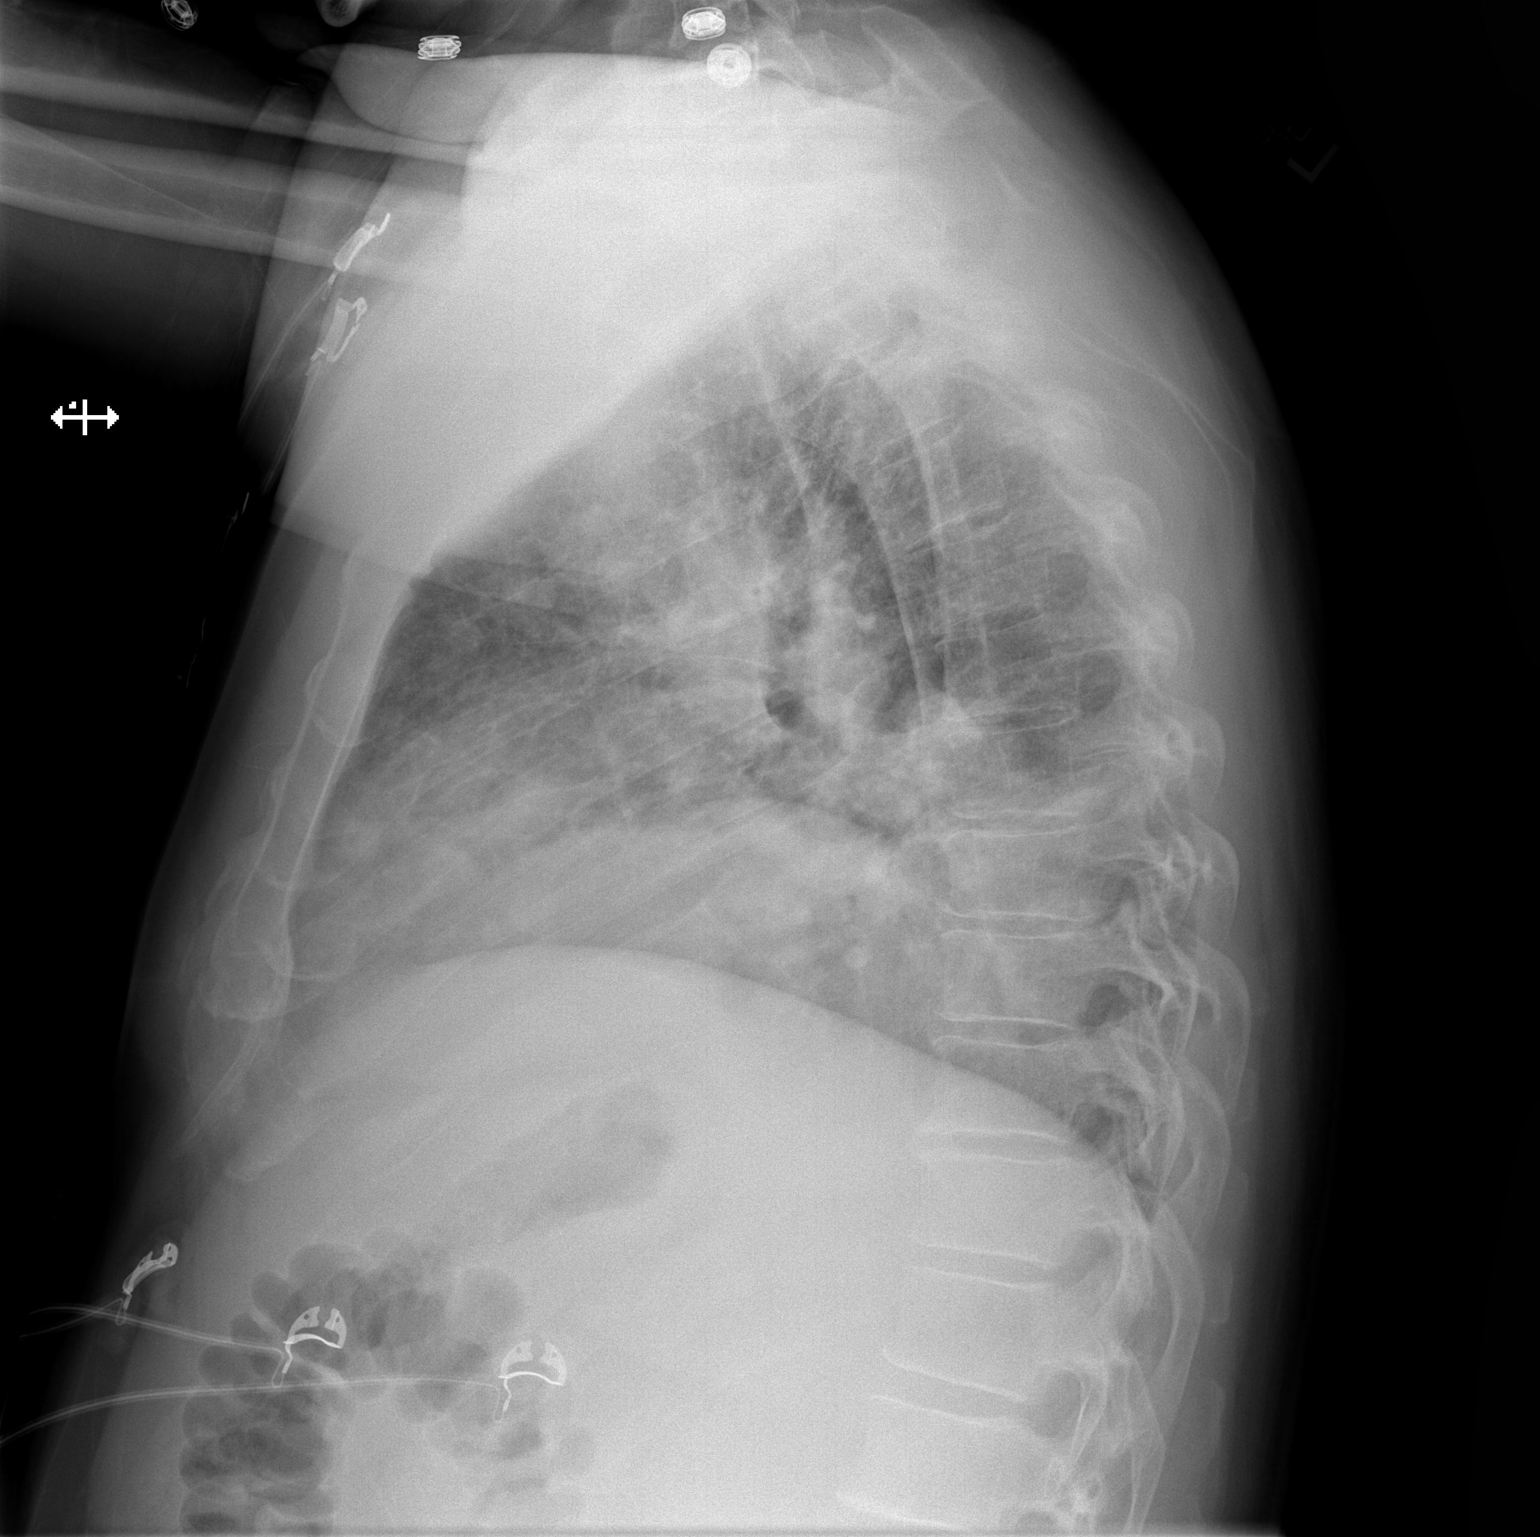

[2 of 2 positions shown; findings below may reference images not displayed]

FINDINGS: Moderate left-sided pleural effusion with airspace disease at the
left base. Bulky hilar adenopathy. Multiple pulmonary metastatic
nodules better seen on CT. Stable cardiac size. No pneumothorax.
IMPRESSION: Moderate left-sided pleural effusion with airspace disease at the
left base, atelectasis versus pneumonia. Bulky hilar adenopathy.
Multiple bilateral pulmonary metastatic lung nodules are better seen
on CT.

## 2020-10-14 MED ORDER — ALBUTEROL SULFATE HFA 108 (90 BASE) MCG/ACT IN AERS
8.0000 | INHALATION_SPRAY | RESPIRATORY_TRACT | Status: AC
  Administered 2020-10-14: 8 via RESPIRATORY_TRACT
  Filled 2020-10-14: qty 6.7

## 2020-10-14 MED ORDER — DIPHENHYDRAMINE HCL 25 MG PO CAPS
25.0000 mg | ORAL_CAPSULE | Freq: Four times a day (QID) | ORAL | Status: DC | PRN
  Administered 2020-10-14 (×2): 25 mg via ORAL
  Filled 2020-10-14: qty 1

## 2020-10-14 MED ORDER — ASPIRIN 81 MG PO CHEW
81.0000 mg | CHEWABLE_TABLET | Freq: Every day | ORAL | Status: DC
  Administered 2020-10-14 – 2020-10-15 (×2): 81 mg via ORAL
  Filled 2020-10-14 (×2): qty 1

## 2020-10-14 MED ORDER — MORPHINE SULFATE (PF) 4 MG/ML IV SOLN
4.0000 mg | Freq: Once | INTRAVENOUS | Status: AC
  Administered 2020-10-14: 4 mg via INTRAVENOUS
  Filled 2020-10-14: qty 1

## 2020-10-14 MED ORDER — FAMOTIDINE 20 MG PO TABS
20.0000 mg | ORAL_TABLET | Freq: Once | ORAL | Status: AC
  Administered 2020-10-14: 20 mg via ORAL
  Filled 2020-10-14: qty 1

## 2020-10-14 MED ORDER — ONDANSETRON HCL 4 MG/2ML IJ SOLN: 4.0000 mg | Freq: Four times a day (QID) | INTRAMUSCULAR | Status: DC | PRN

## 2020-10-14 MED ORDER — ALBUTEROL SULFATE HFA 108 (90 BASE) MCG/ACT IN AERS: 8.0000 | INHALATION_SPRAY | RESPIRATORY_TRACT | Status: DC | PRN

## 2020-10-14 MED ORDER — IOHEXOL 350 MG/ML SOLN
100.0000 mL | Freq: Once | INTRAVENOUS | Status: AC | PRN
  Administered 2020-10-14: 70 mL via INTRAVENOUS

## 2020-10-14 MED ORDER — GADOBUTROL 1 MMOL/ML IV SOLN
9.0000 mL | Freq: Once | INTRAVENOUS | Status: AC | PRN
  Administered 2020-10-14: 9 mL via INTRAVENOUS

## 2020-10-14 MED ORDER — ONDANSETRON HCL 4 MG/2ML IJ SOLN
4.0000 mg | Freq: Once | INTRAMUSCULAR | Status: AC
  Administered 2020-10-14: 4 mg via INTRAVENOUS
  Filled 2020-10-14: qty 2

## 2020-10-14 MED ORDER — HYDROMORPHONE HCL 1 MG/ML IJ SOLN
0.5000 mg | INTRAMUSCULAR | Status: DC | PRN
  Filled 2020-10-14 (×2): qty 1

## 2020-10-14 MED ORDER — SODIUM CHLORIDE 0.45 % IV SOLN: INTRAVENOUS | Status: DC

## 2020-10-14 MED ORDER — DIPHENHYDRAMINE HCL 25 MG PO CAPS
ORAL_CAPSULE | ORAL | Status: AC
  Filled 2020-10-14: qty 1

## 2020-10-14 MED ORDER — DIPHENHYDRAMINE HCL 50 MG/ML IJ SOLN
25.0000 mg | Freq: Once | INTRAMUSCULAR | Status: AC
  Administered 2020-10-14: 25 mg via INTRAVENOUS
  Filled 2020-10-14: qty 1

## 2020-10-14 MED ORDER — ONDANSETRON HCL 4 MG PO TABS: 4.0000 mg | ORAL_TABLET | Freq: Four times a day (QID) | ORAL | Status: DC | PRN

## 2020-10-14 MED ORDER — LIDOCAINE HCL 1 % IJ SOLN
INTRAMUSCULAR | Status: AC
  Filled 2020-10-14: qty 20

## 2020-10-14 NOTE — Progress Notes (Signed)
Events regarding Bob Ewing noted.  The CT scan obtained on October 14, 2020 was personally reviewed and showed enlarging pleural effusion that is undoubtably malignant.  Presented with shortness of breath and difficulty breathing.  I agree with the current management approach and thoracentesis.  Pleurx catheter insertion would be beneficial given his large and likely recurrent effusion.  He is currently receiving immunotherapy utilizing ipilimumab and nivolumab and completed only 1 cycle of therapy.  No immediate plans to change therapy for the time being.  We will continue to follow his progress while he is hospitalized and a full note to follow.

## 2020-10-14 NOTE — Progress Notes (Signed)
Brief Neurology Note  Neurology consulted for incidental finding small subacute L frontal ischemic stroke on MRI brain. 2 significant calvarial mets also noted from known RCC. Neurologic exam is normal and NIHSS = 0 @ 1840 on 10/14/20.   Plan: - MRA H&N - TTE w/ bubble - ASA 81mg  daily - Monitor on tele - LDL - A1c  Full consult note to follow.  Su Monks, MD [facility] [phone]  If 7pm- 7am, please page neurology on call as listed in [place].

## 2020-10-14 NOTE — Discharge Summary (Signed)
Physician Discharge Summary  Bob Ewing PPH:[id number] DOB: Dec 01, 1970 DOA: 10/14/2020  PCP: [name], MD  Admit date: 10/14/2020 Discharge date: 10/14/2020  Admitted From: Home Disposition: Home  Recommendations for Outpatient Follow-up:  1. Follow up with Oncologist in 1 week 2. Consider repeat attempt for thoracentesis 3. Please follow up on the following pending results: Body fluid cell count, body fluid cytology, body fluid culture  Home Health: None Equipment/Devices: None  Discharge Condition: Stable CODE STATUS: Full code Diet recommendation: Heart healthy   Brief/Interim Summary:  Admission HPI written by Vianne Bulls, MD   Chief Complaint: SOB   HPI: Bob Ewing is a 50 y.o. 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  Patient was seen in the emergency department 10 days ago with fever and was found to have significant worsening in his metastatic disease with a pleural effusion on the left, was able to be discharged home but has grown progressively dyspneic which prompted his return today.  He denies any chest pain, any fevers since his last visit, and has not been coughing much, but has become dyspneic with minimal exertion.  Denies any leg swelling or tenderness.    Hospital course:  Dyspnea on exertion Secondary to large pleural effusion. Resolved prior to discharge with thoracentesis.  Large pleural effusion Likely metastatic. Thoracentesis performed, however only 120 mL of amber fluid was removed secondary to patient intolerance. Lights criteria suggests exudative fluid. Body fluid cell count/cytology/culture pending on discharge. Repeat thoracentesis on 3/17 successfully aspirated 900 mL of blood tinged fluid. Recommendation to follow-up with pulmonology as needed for  management if pleural effusion recurs.  Metastatic renal cell carcinoma Patient to follow-up with his oncologist. Oncology ordered an MRI brain which was completed prior to discharge. MRI brain results pending prior to discharge.  Primary hypertension Blood pressure low-normal. Recommend to continue home atenolol. Discontinued hydrochlorothiazide.  Discharge Diagnoses:  Principal Problem:   Large pleural effusion Active Problems:   Primary malignant neoplasm of left kidney with metastasis from kidney to other site [facility])   Hypertension   Ischemic stroke [facility])    Discharge Instructions  Discharge Instructions    Call MD for:  difficulty breathing, headache or visual disturbances   Complete by: As directed    Discharge instructions   Complete by: As directed    You met with [name], a lung doctor with [facility]. If you develop similar symptoms, please contact our office to be seen or for further advice. Tell them you are a patient of [name].  Phone number [phone]   Increase activity slowly   Complete by: As directed      Allergies as of 10/14/2020      Reactions   Phentermine    Kidney pain      Medication List    STOP taking these medications   hydrochlorothiazide 25 MG tablet Commonly known as: HYDRODIURIL     TAKE these medications   atenolol 50 MG tablet Commonly known as: TENORMIN Take 50 mg by mouth daily.   HYDROcodone-acetaminophen 10-325 MG tablet Commonly known as: NORCO Take 1 tablet by mouth every 4 (four) hours as needed for moderate pain or severe pain.       Follow-up Information    [name], MD. Schedule an appointment as soon as possible for a visit  in 1 week(s).   Specialty: Oncology Why: Hospital follow-up Contact information: [place] [address] [phone]        Hunsucker, Bonna Gains, MD. Schedule an appointment as soon as possible for a visit.   Specialty: Pulmonary  Disease Why: Pleural effusion, likely malignant Contact information: [address] [place] [address] [phone]              Allergies  Allergen Reactions  . Phentermine     Kidney pain    Consultations:  Pulmonology  Medical oncology  Interventional radiology   Procedures/Studies: DG Chest 2 View  Result Date: 10/14/2020 CLINICAL DATA:  Chest pain EXAM: CHEST - 2 VIEW COMPARISON:  10/04/2020, CT 10/04/2020 FINDINGS: Moderate left-sided pleural effusion with airspace disease at the left base. Bulky hilar adenopathy. Multiple pulmonary metastatic nodules better seen on CT. Stable cardiac size. No pneumothorax. IMPRESSION: Moderate left-sided pleural effusion with airspace disease at the left base, atelectasis versus pneumonia. Bulky hilar adenopathy. Multiple bilateral pulmonary metastatic lung nodules are better seen on CT. Electronically Signed   By: Donavan Foil M.D.   On: 10/14/2020 01:31   DG Chest 2 View  Result Date: 10/03/2020 CLINICAL DATA:  Pneumonia EXAM: CHEST - 2 VIEW COMPARISON:  09/03/2020 FINDINGS: Lung volumes are small and there has developed bibasilar atelectasis. Discoid atelectasis versus scarring within the right mid lung zone. No definite superimposed focal pulmonary infiltrate. Vascular crowding at the hila. No pneumothorax or pleural effusion. Cardiac size within normal limits. No acute bone abnormality. IMPRESSION: Pulmonary hypoinflation. Electronically Signed   By: Fidela Salisbury MD   On: 10/03/2020 23:10   CT ANGIO CHEST PE W OR WO CONTRAST  Result Date: 10/14/2020 CLINICAL DATA:  Chest pressure. EXAM: CT ANGIOGRAPHY CHEST WITH CONTRAST TECHNIQUE: Multidetector CT imaging of the chest was performed using the standard protocol during bolus administration of intravenous contrast. Multiplanar CT image reconstructions and MIPs were obtained to evaluate the vascular anatomy. CONTRAST:  56m OMNIPAQUE IOHEXOL 350 MG/ML SOLN  COMPARISON:  Ten days ago FINDINGS: Cardiovascular: Normal heart size. No pericardial effusion. Central vessels are distorted by pulmonary and nodal metastatic disease. Apparent filling defect within apical segment left upper lobe pulmonary arteries is stable from before and expanded, more consistent with metastatic disease than pulmonary embolism. Mediastinum/Nodes: Hilar and mediastinal nodal metastases which are bulky in narrow adjacent airways. A right paratracheal node measures up to 2.7 cm. Lungs/Pleura: Innumerable pulmonary metastases randomly distributed in the bilateral lungs. Large and increased left pleural effusion without discrete pleural nodule on this arterial phase study. Expected left lower lobe atelectasis. No edema or air leak. Patchy areas of air trapping likely from bronchial narrowing. Upper Abdomen: Partially covered bulky left renal mass with extracapsular extension. Musculoskeletal: Large posterior right ninth rib metastasis with transcortical and extraosseous growth. Well-defined proximal left humeral metastasis. Review of the MIP images confirms the above findings. IMPRESSION: 1. Large and enlarging left pleural effusion with compared to study 10 days ago. 2. Negative for acute pulmonary embolism. 3. Widespread metastatic disease. Electronically Signed   By: JMonte FantasiaM.D.   On: 10/14/2020 04:15   CT Angio Chest PE W/Cm &/Or Wo Cm  Result Date: 10/04/2020 CLINICAL DATA:  PE suspected EXAM: CT ANGIOGRAPHY CHEST WITH CONTRAST TECHNIQUE: Multidetector CT imaging of the chest was performed using the standard protocol during bolus administration of intravenous contrast. Multiplanar CT image reconstructions and MIPs were obtained to evaluate the vascular anatomy. CONTRAST:  1061m  OMNIPAQUE IOHEXOL 350 MG/ML SOLN COMPARISON:  CT 09/01/2020, CT biopsy 09/14/2020, chest radiograph 10/03/2020 FINDINGS: Cardiovascular: Satisfactory opacification of pulmonary arteries to the segmental level.  There is significant narrowing of the distal main and central lobar pulmonary arteries in the bilateral hila secondary to the large confluent hilar nodal conglomerate. No convincing pulmonary artery emboli are identified. Cardiac size is top normal. No pericardial effusion. The aortic root is suboptimally assessed given cardiac pulsation artifact. The aorta is normal caliber. No acute luminal abnormality of the imaged aorta. No periaortic stranding or hemorrhage. Shared origin of the brachiocephalic and left common carotid arteries. Proximal great vessels are otherwise unremarkable. No major venous abnormalities. Mediastinum/Nodes: Significantly increased size of the bulky hilar nodal conglomerate size with resulting narrowing of the hilar vessels and airways. On the right this measures approximately 4.1 x 7.2 x 6.1 cm in conglomerate size separate nodal deposits are present in the left hilum measuring up to 2.0 cm superiorly and 2.9 cm inferiorly. No free mediastinal fluid or gas. Thyroid gland and thoracic inlet are unremarkable. No axillary adenopathy. Lungs/Pleura: New large left pleural effusion, possibly malignant. Atelectatic collapse the left lower lobe with several fluid-filled airways beyond a region obstruction secondary to the hilar nodal conglomerate. Some underlying postobstructive pneumonia/infectious process is not excluded. Additional narrowed and obstructed airways are noted throughout the right lung as well with further atelectasis. Background of paraseptal emphysematous changes in the lungs. Numerous rounded metastatic lesions are seen throughout the lung parenchyma bilaterally significantly increased in number and size from comparison imaging 09/01/2020. No pneumothorax. Upper Abdomen: Increasingly conspicuous thickening of the left adrenal gland. Large heterogeneous mass arising from the upper pole left kidney, possibly increased from prior though incompletely visualized and characterized on  this nondedicated exam. Extensive surrounding stranding and inflammatory changes. Musculoskeletal: Increasing size of a expansile lytic lesion in the posterior right ninth rib measuring up to 2.8 x 1.3 cm with soft tissue extension. Lytic lesion noted in the left humeral head measuring 2.7 by 1.7 cm in size with mild soft tissue extension as well. No other concerning lytic or blastic lesions are evident Review of the MIP images confirms the above findings. IMPRESSION: 1. No evidence of acute pulmonary artery emboli. 2. Significantly worsened metastatic disease seen throughout the chest and upper abdomen including a significant increase in size of the bilateral bulky hilar nodal deposits resulting and narrowing of the hilar vessels and airways. 3. Atelectatic collapse the left lower lobe with several fluid-filled airways. Underlying postobstructive pneumonia/infectious process is not excluded. 4. New large left pleural effusion, possibly malignant. 5. Numerous rounded metastatic lesions throughout the lung parenchyma bilaterally significantly increased in number and size from comparison. 6. Increasing size of the lytic metastatic lesions in the left humeral head and right ninth rib. 7. Large heterogeneous mass arising from the upper pole left kidney, possibly increased from prior though incompletely visualized and characterized on this nondedicated exam. 8. Increasingly conspicuous thickening of the left adrenal gland, concerning for metastatic disease as well. 9. Emphysema (ICD10-J43.9). These results were called by telephone at the time of interpretation on 10/04/2020 at 2:12 am to provider RACHEL LITTLE , who verbally acknowledged these results. Electronically Signed   By: Lovena Le M.D.   On: 10/04/2020 02:12   DG Chest Port 1 View  Result Date: 10/14/2020 CLINICAL DATA:  Post left thoracentesis EXAM: PORTABLE CHEST 1 VIEW COMPARISON:  10/14/2020 FINDINGS: Decreased left pleural effusion with improved  aeration at the left lung base. Persistent bilateral  pulmonary opacities. No pneumothorax. Stable cardiomediastinal contours. IMPRESSION: Decreased left pleural effusion post thoracentesis. No pneumothorax. Otherwise persistent bilateral pulmonary opacities. Electronically Signed   By: Macy Mis M.D.   On: 10/14/2020 15:30   US THORACENTESIS ASP PLEURAL SPACE W/IMG GUIDE  Result Date: 10/14/2020 INDICATION: Patient with metastatic renal cell carcinoma, left pleural effusion. Request is made for diagnostic and therapeutic thoracentesis. EXAM: ULTRASOUND GUIDED DIAGNOSTIC AND THERAPEUTIC LEFT THORACENTESIS MEDICATIONS: 10 mL 1% lidocaine COMPLICATIONS: None immediate. PROCEDURE: An ultrasound guided thoracentesis was thoroughly discussed with the patient and questions answered. The benefits, risks, alternatives and complications were also discussed. The patient understands and wishes to proceed with the procedure. Written consent was obtained. Ultrasound was performed to localize and mark an adequate pocket of fluid in the left chest. The area was then prepped and draped in the normal sterile fashion. 1% Lidocaine was used for local anesthesia. Under ultrasound guidance a 6 Fr Safe-T-Centesis catheter was introduced. Thoracentesis was performed, however prior to removal of all fluid patient complained of dizziness, lightheadedness, stated "I am going to pass out." The catheter was removed and a dressing applied. Vital signs remained stable throughout. FINDINGS: A total of approximately 120 mL of amber fluid was removed. Samples were sent to the laboratory as requested by the clinical team. IMPRESSION: Successful ultrasound guided diagnostic and therapeutic left thoracentesis yielding 120 mL of pleural fluid. Read by: Brynda Greathouse PA-C Electronically Signed   By: Corrie Mckusick D.O.   On: 10/14/2020 15:26      Subjective: Dyspnea is improved. No chest pain.  Discharge Exam: Vitals:   10/14/20 1600  10/14/20 1601  BP:    Pulse:    Resp:    Temp:    SpO2: 98% 90%   Vitals:   10/14/20 1411 10/14/20 1434 10/14/20 1600 10/14/20 1601  BP: (!) 107/52 (!) 99/58    Pulse:      Resp:      Temp:      TempSrc:      SpO2:   98% 90%  Weight:      Height:        General: Pt is alert, awake, not in acute distress Cardiovascular: RRR, S1/S2 +, no rubs, no gallops Respiratory: CTA bilaterally but decreased breath sounds on left. No wheezing, no rhonchi Abdominal: Soft, NT, ND, bowel sounds + Extremities: no edema, no cyanosis    The results of significant diagnostics from this hospitalization (including imaging, microbiology, ancillary and laboratory) are listed below for reference.     Microbiology: Recent Results (from the past 240 hour(s))  Resp Panel by RT-PCR (Flu A&B, Covid) Nasopharyngeal Swab     Status: None   Collection Time: 10/14/20  1:42 AM   Specimen: Nasopharyngeal Swab; Nasopharyngeal(NP) swabs in vial transport medium  Result Value Ref Range Status   SARS Coronavirus 2 by RT PCR NEGATIVE NEGATIVE Final    Comment: (NOTE) SARS-CoV-2 target nucleic acids are NOT DETECTED.  The SARS-CoV-2 RNA is generally detectable in upper respiratory specimens during the acute phase of infection. The lowest concentration of SARS-CoV-2 viral copies this assay can detect is 138 copies/mL. A negative result does not preclude SARS-Cov-2 infection and should not be used as the sole basis for treatment or other patient management decisions. A negative result may occur with  improper specimen collection/handling, submission of specimen other than nasopharyngeal swab, presence of viral mutation(s) within the areas targeted by this assay, and inadequate number of viral copies(<138 copies/mL). A negative result must  be combined with clinical observations, patient history, and epidemiological information. The expected result is Negative.  Fact Sheet for Patients:   EntrepreneurPulse.com.au  Fact Sheet for Healthcare Providers:  IncredibleEmployment.be  This test is no t yet approved or cleared by the [place] FDA and  has been authorized for detection and/or diagnosis of SARS-CoV-2 by FDA under an Emergency Use Authorization (EUA). This EUA will remain  in effect (meaning this test can be used) for the duration of the COVID-19 declaration under Section 564(b)(1) of the Act, 21 U.S.C.section 360bbb-3(b)(1), unless the authorization is terminated  or revoked sooner.       Influenza A by PCR NEGATIVE NEGATIVE Final   Influenza B by PCR NEGATIVE NEGATIVE Final    Comment: (NOTE) The Xpert Xpress SARS-CoV-2/FLU/RSV plus assay is intended as an aid in the diagnosis of influenza from Nasopharyngeal swab specimens and should not be used as a sole basis for treatment. Nasal washings and aspirates are unacceptable for Xpert Xpress SARS-CoV-2/FLU/RSV testing.  Fact Sheet for Patients: EntrepreneurPulse.com.au  Fact Sheet for Healthcare Providers: IncredibleEmployment.be  This test is not yet approved or cleared by the [place] FDA and has been authorized for detection and/or diagnosis of SARS-CoV-2 by FDA under an Emergency Use Authorization (EUA). This EUA will remain in effect (meaning this test can be used) for the duration of the COVID-19 declaration under Section 564(b)(1) of the Act, 21 U.S.C. section 360bbb-3(b)(1), unless the authorization is terminated or revoked.  Performed at [facility], [place] [address], [place], [place] [address]      Labs: BNP (last 3 results) Recent Labs    10/14/20 0132  BNP 65.9   Basic Metabolic Panel: Recent Labs  Lab 10/14/20 0132  NA 136  K 4.0  CL 101  CO2 24  GLUCOSE 112*  BUN 20  CREATININE 1.29*  CALCIUM 8.6*   Liver Function Tests: Recent Labs  Lab 10/14/20 0132  AST 28  ALT 50*   ALKPHOS 86  BILITOT 0.8  PROT 6.9  ALBUMIN 3.3*   No results for input(s): LIPASE, AMYLASE in the last 168 hours. No results for input(s): AMMONIA in the last 168 hours. CBC: Recent Labs  Lab 10/14/20 0132  WBC 7.2  HGB 10.3*  HCT 32.1*  MCV 91.2  PLT 254   Cardiac Enzymes: No results for input(s): CKTOTAL, CKMB, CKMBINDEX, TROPONINI in the last 168 hours. BNP: Invalid input(s): POCBNP CBG: No results for input(s): GLUCAP in the last 168 hours. D-Dimer No results for input(s): DDIMER in the last 72 hours. Hgb A1c No results for input(s): HGBA1C in the last 72 hours. Lipid Profile No results for input(s): CHOL, HDL, LDLCALC, TRIG, CHOLHDL, LDLDIRECT in the last 72 hours. Thyroid function studies No results for input(s): TSH, T4TOTAL, T3FREE, THYROIDAB in the last 72 hours.  Invalid input(s): FREET3 Anemia work up No results for input(s): VITAMINB12, FOLATE, FERRITIN, TIBC, IRON, RETICCTPCT in the last 72 hours. Urinalysis    Component Value Date/Time   COLORURINE YELLOW 10/03/2020 2217   APPEARANCEUR CLEAR 10/03/2020 2217   LABSPEC 1.011 10/03/2020 2217   PHURINE 5.0 10/03/2020 2217   GLUCOSEU NEGATIVE 10/03/2020 2217   HGBUR SMALL (A) 10/03/2020 2217   [place] NEGATIVE 10/03/2020 2217   [place] NEGATIVE 10/03/2020 2217   PROTEINUR NEGATIVE 10/03/2020 2217   NITRITE NEGATIVE 10/03/2020 2217   LEUKOCYTESUR NEGATIVE 10/03/2020 2217   Sepsis Labs Invalid input(s): PROCALCITONIN,  WBC,  LACTICIDVEN Microbiology Recent Results (from the past 240 hour(s))  Resp Panel by RT-PCR (Flu A&B, Covid) Nasopharyngeal Swab     Status: None   Collection Time: 10/14/20  1:42 AM   Specimen: Nasopharyngeal Swab; Nasopharyngeal(NP) swabs in vial transport medium  Result Value Ref Range Status   SARS Coronavirus 2 by RT PCR NEGATIVE NEGATIVE Final    Comment: (NOTE) SARS-CoV-2 target nucleic acids are NOT DETECTED.  The SARS-CoV-2 RNA is generally detectable in upper  respiratory specimens during the acute phase of infection. The lowest concentration of SARS-CoV-2 viral copies this assay can detect is 138 copies/mL. A negative result does not preclude SARS-Cov-2 infection and should not be used as the sole basis for treatment or other patient management decisions. A negative result may occur with  improper specimen collection/handling, submission of specimen other than nasopharyngeal swab, presence of viral mutation(s) within the areas targeted by this assay, and inadequate number of viral copies(<138 copies/mL). A negative result must be combined with clinical observations, patient history, and epidemiological information. The expected result is Negative.  Fact Sheet for Patients:  EntrepreneurPulse.com.au  Fact Sheet for Healthcare Providers:  IncredibleEmployment.be  This test is no t yet approved or cleared by the [place] FDA and  has been authorized for detection and/or diagnosis of SARS-CoV-2 by FDA under an Emergency Use Authorization (EUA). This EUA will remain  in effect (meaning this test can be used) for the duration of the COVID-19 declaration under Section 564(b)(1) of the Act, 21 U.S.C.section 360bbb-3(b)(1), unless the authorization is terminated  or revoked sooner.       Influenza A by PCR NEGATIVE NEGATIVE Final   Influenza B by PCR NEGATIVE NEGATIVE Final    Comment: (NOTE) The Xpert Xpress SARS-CoV-2/FLU/RSV plus assay is intended as an aid in the diagnosis of influenza from Nasopharyngeal swab specimens and should not be used as a sole basis for treatment. Nasal washings and aspirates are unacceptable for Xpert Xpress SARS-CoV-2/FLU/RSV testing.  Fact Sheet for Patients: EntrepreneurPulse.com.au  Fact Sheet for Healthcare Providers: IncredibleEmployment.be  This test is not yet approved or cleared by the [place] FDA and has been  authorized for detection and/or diagnosis of SARS-CoV-2 by FDA under an Emergency Use Authorization (EUA). This EUA will remain in effect (meaning this test can be used) for the duration of the COVID-19 declaration under Section 564(b)(1) of the Act, 21 U.S.C. section 360bbb-3(b)(1), unless the authorization is terminated or revoked.  Performed at [facility], [place] [address] [place], [place] [address]      SIGNED:   Cordelia Poche, MD [facility] 10/14/2020, 4:13 PM

## 2020-10-14 NOTE — Procedures (Signed)
PROCEDURE SUMMARY:  Successful US guided diagnostic left thoracentesis. Yielded 120 mL of amber fluid. Pt tolerated procedure fair.  Became vasovagal stating "I'm passing out."  VSS: BP 99/58, O2100%, however patient could not tolerate.  Catheter removed.  No immediate complications.  Specimen was sent for labs. CXR ordered.  EBL < 5 mL  Docia Barrier PA-C 10/14/2020 2:39 PM

## 2020-10-14 NOTE — Progress Notes (Signed)
   Patient seen and examined at bedside, patient admitted after midnight, please see earlier detailed admission note by Vianne Bulls, MD. Briefly, patient presented secondary to progressive dyspnea on exertion  Subjective: Breathing better this morning.  BP 107/64   Pulse 89   Temp 98.7 F (37.1 C) (Oral)   Resp 14   Ht 5\' 8"  (1.727 m)   Wt 90.7 kg   SpO2 99%   BMI 30.41 kg/m   General exam: Appears calm and comfortable Respiratory system: Diminished breath sounds on left. Respiratory effort normal. Cardiovascular system: S1 & S2 heard, RRR. No murmurs, rubs, gallops or clicks. Gastrointestinal system: Abdomen is nondistended, soft and nontender. No organomegaly or masses felt. Normal bowel sounds heard. Central nervous system: Alert and oriented. No focal neurological deficits. Musculoskeletal: No edema. No calf tenderness Skin: No cyanosis. No rashes Psychiatry: Judgement and insight appear normal. Mood & affect appropriate.   Brief assessment/Plan:  Dyspnea on exertion Secondary to large pleural effusion. Resolved prior to discharge with thoracentesis.  Large pleural effusion Likely metastatic. Thoracentesis performed, however only 120 mL of amber fluid was removed secondary to patient intolerance. Lights criteria suggests exudative fluid. Body fluid cell count/cytology/culture pending on discharge.  Metastatic renal cell carcinoma Patient to follow-up with his oncologist. Oncology ordered an MRI brain which was completed prior to discharge. MRI brain results significant for bony metastasis  Acute/subacute stroke Seen incidentally on MRI brain. -Neurology consulted  Primary hypertension Blood pressure low-normal. Recommend to continue home atenolol and to discontinue hydrochlorothiazide on discharge  Family communication: None at bedside DVT prophylaxis: SCDs Disposition: Discharge home likely in 24 hours pending neurology recommendations/management  Cordelia Poche, MD [facility] 10/14/2020, 1:43 PM

## 2020-10-14 NOTE — ED Notes (Signed)
Pt to X-Ray via stretcher 

## 2020-10-14 NOTE — Progress Notes (Signed)
IP PROGRESS NOTE  Subjective:   Mr. Honea reports of feeling slightly better but still dyspneic with nonproductive cough.  He denies any fevers or chills at this time.  He denies any bone pain or pathological fractures.  He denies any neurological deficits.  Objective:  Vital signs in last 24 hours: Temp:  [98.4 F (36.9 C)-98.7 F (37.1 C)] 98.7 F (37.1 C) (03/16 1156) Pulse Rate:  [72-96] 89 (03/16 1156) Resp:  [14-31] 14 (03/16 1156) BP: (96-125)/(58-75) 107/64 (03/16 1156) SpO2:  [91 %-100 %] 99 % (03/16 1156) Weight:  [200 lb (90.7 kg)] 200 lb (90.7 kg) (03/16 0105) Weight change:     Intake/Output from previous day:  General: Alert, awake without distress. Head: Normocephalic atraumatic. Mouth: mucous membranes moist, pharynx normal without lesions Eyes: No scleral icterus.  Pupils are equal and round reactive to light. Resp: Decreased breath sounds on the left. Cardio: regular rate and rhythm, S1, S2 normal, no murmur, click, rub or gallop GI: soft, non-tender; bowel sounds normal; no masses,  no organomegaly Musculoskeletal: No joint deformity or effusion. Neurological: No motor, sensory deficits.  Intact deep tendon reflexes. Skin: Protrusions noted on his scalp appears to be slightly diminished.    Lab Results: Recent Labs    10/14/20 0132  WBC 7.2  HGB 10.3*  HCT 32.1*  PLT 254    BMET Recent Labs    10/14/20 0132  NA 136  K 4.0  CL 101  CO2 24  GLUCOSE 112*  BUN 20  CREATININE 1.29*  CALCIUM 8.6*    Studies/Results: DG Chest 2 View  Result Date: 10/14/2020 CLINICAL DATA:  Chest pain EXAM: CHEST - 2 VIEW COMPARISON:  10/04/2020, CT 10/04/2020 FINDINGS: Moderate left-sided pleural effusion with airspace disease at the left base. Bulky hilar adenopathy. Multiple pulmonary metastatic nodules better seen on CT. Stable cardiac size. No pneumothorax. IMPRESSION: Moderate left-sided pleural effusion with airspace disease at the left base,  atelectasis versus pneumonia. Bulky hilar adenopathy. Multiple bilateral pulmonary metastatic lung nodules are better seen on CT. Electronically Signed   By: Donavan Foil M.D.   On: 10/14/2020 01:31   CT ANGIO CHEST PE W OR WO CONTRAST  Result Date: 10/14/2020 CLINICAL DATA:  Chest pressure. EXAM: CT ANGIOGRAPHY CHEST WITH CONTRAST TECHNIQUE: Multidetector CT imaging of the chest was performed using the standard protocol during bolus administration of intravenous contrast. Multiplanar CT image reconstructions and MIPs were obtained to evaluate the vascular anatomy. CONTRAST:  55mL OMNIPAQUE IOHEXOL 350 MG/ML SOLN COMPARISON:  Ten days ago FINDINGS: Cardiovascular: Normal heart size. No pericardial effusion. Central vessels are distorted by pulmonary and nodal metastatic disease. Apparent filling defect within apical segment left upper lobe pulmonary arteries is stable from before and expanded, more consistent with metastatic disease than pulmonary embolism. Mediastinum/Nodes: Hilar and mediastinal nodal metastases which are bulky in narrow adjacent airways. A right paratracheal node measures up to 2.7 cm. Lungs/Pleura: Innumerable pulmonary metastases randomly distributed in the bilateral lungs. Large and increased left pleural effusion without discrete pleural nodule on this arterial phase study. Expected left lower lobe atelectasis. No edema or air leak. Patchy areas of air trapping likely from bronchial narrowing. Upper Abdomen: Partially covered bulky left renal mass with extracapsular extension. Musculoskeletal: Large posterior right ninth rib metastasis with transcortical and extraosseous growth. Well-defined proximal left humeral metastasis. Review of the MIP images confirms the above findings. IMPRESSION: 1. Large and enlarging left pleural effusion with compared to study 10 days ago. 2. Negative for acute  pulmonary embolism. 3. Widespread metastatic disease. Electronically Signed   By: Monte Fantasia  M.D.   On: 10/14/2020 04:15    Medications: I have reviewed the patient's current medications.  Assessment/Plan:  50 year old man with:  1.  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  He received the first cycle of ipilimumab and nivolumab without any major complications.  It is difficult to assess response at this time after 1 cycle of therapy.  His cutaneous nodules could potentially have improved after treatment.  I recommend no change in his treatment plan with low threshold for switching to a different salvage therapy if he has less than adequate response.  2.  Pleural effusion: Related to malignancy.  I thoracentesis and consideration for Pleurx catheter.  3.  CNS metastasis evaluation: His MRI has not been completed.  Given his scalp lesions with advanced malignancy await for MRI of the brain while he is hospitalized.  4.  Prognosis and disposition: He has an incurable malignancy but aggressive measures are warranted given his reasonable performance status.  I do not have any objections discharge after thoracentesis and MRI and if his clinical status is stable.  35  minutes were dedicated to this visit.  50% of the time was spent face-to-face.  The time was spent on reviewing laboratory data, imaging studies, discussing treatment options, and therapeutic interventions for complications related to his cancer.      LOS: 0 days   Zola Button 10/14/2020, 12:19 PM

## 2020-10-14 NOTE — ED Triage Notes (Signed)
Patient complaining of right chest pain. Patient also is complaining of Sob. Patient states this started early this am.

## 2020-10-14 NOTE — Discharge Instructions (Addendum)
Bob Ewing,  You were in the hospital with some trouble breathing. This appears to be related to fluid around your lung. The concern is this fluid is from your cancer. You had a lot of fluid drained succesfully. Recommendation is for you to follow-up with your oncologist for results of your lab work. You are also recommended to follow-up with pulmonology for the fluid around your lung. Please return if you have significant trouble breathing. While here, you had an MRI that identified a stroke. Neurology has recommended that you discharge on aspirin, follow-up with neurology as an outpatient and follow-up with cardiology for a heart monitor. These appointments should be set up for you, so expect calls with regard to their details.

## 2020-10-14 NOTE — H&P (Signed)
History and Physical    Bob Ewing LGX:[id number] DOB: [birth year] DOA: 10/14/2020  PCP: [name], MD   Patient coming from: Home   Chief Complaint: SOB   HPI: Bob Ewing is a 50 y.o. 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  Patient was seen in the emergency department 10 days ago with fever and was found to have significant worsening in his metastatic disease with a pleural effusion on the left, was able to be discharged home but has grown progressively dyspneic which prompted his return today.  He denies any chest pain, any fevers since his last visit, and has not been coughing much, but has become dyspneic with minimal exertion.  Denies any leg swelling or tenderness.  ED Course: Upon arrival to the ED, patient is found to be afebrile, saturating mid 90s on room air, and with blood pressure 96/64.  EKG features sinus rhythm.  Chemistry panel with creatinine 1.29 and CBC with stable normocytic anemia.  CTA chest negative for PE but concerning for large and enlarging left pleural effusion.  High-sensitivity troponin was normal x2 and BNP also normal.  Review of Systems:  All other systems reviewed and apart from HPI, are negative.  Past Medical History:  Diagnosis Date  . Cancer ([place])   . Hypertension     History reviewed. No pertinent surgical history.  Social History:   reports that he has been smoking. He has never used smokeless tobacco. He reports current alcohol use. He reports that he does not use drugs.  Allergies  Allergen Reactions  . Phentermine     Kidney pain    History reviewed. No pertinent family history.   Prior to Admission medications   Medication Sig Start Date End Date Taking? Authorizing Provider  atenolol (TENORMIN) 50 MG tablet Take 50 mg by mouth daily.   Yes  [provider]  hydrochlorothiazide (HYDRODIURIL) 25 MG tablet Take 25 mg by mouth daily.   Yes [provider]  HYDROcodone-acetaminophen (NORCO) 10-325 MG tablet Take 1 tablet by mouth every 4 (four) hours as needed for moderate pain or severe pain.   Yes [provider]    Physical Exam: Vitals:   10/14/20 0430 10/14/20 0445 10/14/20 0500 10/14/20 0615  BP: 111/63 97/65 104/69   Pulse: 90 85 87 79  Resp: (!) 22 17 (!) 23 16  Temp:      TempSrc:      SpO2: 99% 95% 91% 95%  Weight:      Height:        Constitutional: NAD, calm  Eyes: PERTLA, lids and conjunctivae normal ENMT: Mucous membranes are moist. Posterior pharynx clear of any exudate or lesions.   Neck: normal, supple, no masses, no thyromegaly Respiratory: Diminished on left, no wheezing. No accessory muscle use.  Cardiovascular: S1 & S2 heard, regular rate and rhythm. No extremity edema.  Abdomen: No distension, no tenderness, soft. Bowel sounds active.  Musculoskeletal: no clubbing / cyanosis. No joint deformity upper and lower extremities.   Skin: no significant rashes, lesions, ulcers. Warm, dry, well-perfused. Neurologic: CN 2-12 grossly intact. Sensation intact. Moving all extremities.  Psychiatric: Alert and oriented to person, place, and situation. Pleasant and cooperative.    Labs and Imaging on Admission: I have personally reviewed following labs and imaging studies  CBC: Recent Labs  Lab 10/14/20 0132  WBC  7.2  HGB 10.3*  HCT 32.1*  MCV 91.2  PLT 706   Basic Metabolic Panel: Recent Labs  Lab 10/14/20 0132  NA 136  K 4.0  CL 101  CO2 24  GLUCOSE 112*  BUN 20  CREATININE 1.29*  CALCIUM 8.6*   GFR: Estimated Creatinine Clearance: 76.6 mL/min (A) (by C-G formula based on SCr of 1.29 mg/dL (H)). Liver Function Tests: Recent Labs  Lab 10/14/20 0132  AST 28  ALT 50*  ALKPHOS 86  BILITOT 0.8  PROT 6.9  ALBUMIN 3.3*   No results for input(s): LIPASE, AMYLASE  in the last 168 hours. No results for input(s): AMMONIA in the last 168 hours. Coagulation Profile: No results for input(s): INR, PROTIME in the last 168 hours. Cardiac Enzymes: No results for input(s): CKTOTAL, CKMB, CKMBINDEX, TROPONINI in the last 168 hours. BNP (last 3 results) No results for input(s): PROBNP in the last 8760 hours. HbA1C: No results for input(s): HGBA1C in the last 72 hours. CBG: No results for input(s): GLUCAP in the last 168 hours. Lipid Profile: No results for input(s): CHOL, HDL, LDLCALC, TRIG, CHOLHDL, LDLDIRECT in the last 72 hours. Thyroid Function Tests: No results for input(s): TSH, T4TOTAL, FREET4, T3FREE, THYROIDAB in the last 72 hours. Anemia Panel: No results for input(s): VITAMINB12, FOLATE, FERRITIN, TIBC, IRON, RETICCTPCT in the last 72 hours. Urine analysis:    Component Value Date/Time   COLORURINE YELLOW 10/03/2020 2217   APPEARANCEUR CLEAR 10/03/2020 2217   LABSPEC 1.011 10/03/2020 2217   PHURINE 5.0 10/03/2020 2217   GLUCOSEU NEGATIVE 10/03/2020 2217   HGBUR SMALL (A) 10/03/2020 2217   BILIRUBINUR NEGATIVE 10/03/2020 2217   [place] NEGATIVE 10/03/2020 2217   PROTEINUR NEGATIVE 10/03/2020 2217   NITRITE NEGATIVE 10/03/2020 2217   LEUKOCYTESUR NEGATIVE 10/03/2020 2217   Sepsis Labs: @LABRCNTIP (procalcitonin:4,lacticidven:4) ) Recent Results (from the past 240 hour(s))  Resp Panel by RT-PCR (Flu A&B, Covid) Nasopharyngeal Swab     Status: None   Collection Time: 10/14/20  1:42 AM   Specimen: Nasopharyngeal Swab; Nasopharyngeal(NP) swabs in vial transport medium  Result Value Ref Range Status   SARS Coronavirus 2 by RT PCR NEGATIVE NEGATIVE Final    Comment: (NOTE) SARS-CoV-2 target nucleic acids are NOT DETECTED.  The SARS-CoV-2 RNA is generally detectable in upper respiratory specimens during the acute phase of infection. The lowest concentration of SARS-CoV-2 viral copies this assay can detect is 138 copies/mL. A negative  result does not preclude SARS-Cov-2 infection and should not be used as the sole basis for treatment or other patient management decisions. A negative result may occur with  improper specimen collection/handling, submission of specimen other than nasopharyngeal swab, presence of viral mutation(s) within the areas targeted by this assay, and inadequate number of viral copies(<138 copies/mL). A negative result must be combined with clinical observations, patient history, and epidemiological information. The expected result is Negative.  Fact Sheet for Patients:  EntrepreneurPulse.com.au  Fact Sheet for Healthcare Providers:  IncredibleEmployment.be  This test is no t yet approved or cleared by the [place] FDA and  has been authorized for detection and/or diagnosis of SARS-CoV-2 by FDA under an Emergency Use Authorization (EUA). This EUA will remain  in effect (meaning this test can be used) for the duration of the COVID-19 declaration under Section 564(b)(1) of the Act, 21 U.S.C.section 360bbb-3(b)(1), unless the authorization is terminated  or revoked sooner.       Influenza A by PCR NEGATIVE NEGATIVE Final   Influenza B by  PCR NEGATIVE NEGATIVE Final    Comment: (NOTE) The Xpert Xpress SARS-CoV-2/FLU/RSV plus assay is intended as an aid in the diagnosis of influenza from Nasopharyngeal swab specimens and should not be used as a sole basis for treatment. Nasal washings and aspirates are unacceptable for Xpert Xpress SARS-CoV-2/FLU/RSV testing.  Fact Sheet for Patients: EntrepreneurPulse.com.au  Fact Sheet for Healthcare Providers: IncredibleEmployment.be  This test is not yet approved or cleared by the [place] FDA and has been authorized for detection and/or diagnosis of SARS-CoV-2 by FDA under an Emergency Use Authorization (EUA). This EUA will remain in effect (meaning this test can be used)  for the duration of the COVID-19 declaration under Section 564(b)(1) of the Act, 21 U.S.C. section 360bbb-3(b)(1), unless the authorization is terminated or revoked.  Performed at [facility], [place] [address], [place], [place] [address]      Radiological Exams on Admission: DG Chest 2 View  Result Date: 10/14/2020 CLINICAL DATA:  Chest pain EXAM: CHEST - 2 VIEW COMPARISON:  10/04/2020, CT 10/04/2020 FINDINGS: Moderate left-sided pleural effusion with airspace disease at the left base. Bulky hilar adenopathy. Multiple pulmonary metastatic nodules better seen on CT. Stable cardiac size. No pneumothorax. IMPRESSION: Moderate left-sided pleural effusion with airspace disease at the left base, atelectasis versus pneumonia. Bulky hilar adenopathy. Multiple bilateral pulmonary metastatic lung nodules are better seen on CT. Electronically Signed   By: Donavan Foil M.D.   On: 10/14/2020 01:31   CT ANGIO CHEST PE W OR WO CONTRAST  Result Date: 10/14/2020 CLINICAL DATA:  Chest pressure. EXAM: CT ANGIOGRAPHY CHEST WITH CONTRAST TECHNIQUE: Multidetector CT imaging of the chest was performed using the standard protocol during bolus administration of intravenous contrast. Multiplanar CT image reconstructions and MIPs were obtained to evaluate the vascular anatomy. CONTRAST:  55mL OMNIPAQUE IOHEXOL 350 MG/ML SOLN COMPARISON:  Ten days ago FINDINGS: Cardiovascular: Normal heart size. No pericardial effusion. Central vessels are distorted by pulmonary and nodal metastatic disease. Apparent filling defect within apical segment left upper lobe pulmonary arteries is stable from before and expanded, more consistent with metastatic disease than pulmonary embolism. Mediastinum/Nodes: Hilar and mediastinal nodal metastases which are bulky in narrow adjacent airways. A right paratracheal node measures up to 2.7 cm. Lungs/Pleura: Innumerable pulmonary metastases randomly distributed in the bilateral lungs.  Large and increased left pleural effusion without discrete pleural nodule on this arterial phase study. Expected left lower lobe atelectasis. No edema or air leak. Patchy areas of air trapping likely from bronchial narrowing. Upper Abdomen: Partially covered bulky left renal mass with extracapsular extension. Musculoskeletal: Large posterior right ninth rib metastasis with transcortical and extraosseous growth. Well-defined proximal left humeral metastasis. Review of the MIP images confirms the above findings. IMPRESSION: 1. Large and enlarging left pleural effusion with compared to study 10 days ago. 2. Negative for acute pulmonary embolism. 3. Widespread metastatic disease. Electronically Signed   By: Monte Fantasia M.D.   On: 10/14/2020 04:15    EKG: Independently reviewed. Sinus rhythm.   Assessment/Plan   1. Large left pleural effusion - Patient with recently diagnosed stage IV RCC with pulmonary metastases presents with progressive SOB, is in distress or hypoxic but dyspneic at rest and found to have large left pleural effusion increased from scan 10 days earlier  - Consult IR for thoracentesis, possible Pleurx catheter    2. Renal cell carcinoma  - Presented with flank pain September 01, 2020 and found to have renal mass with retroperitoneal adenopathy and innumerable pulm nodules  -  Following with Dr. Alen Blew, started on nivolumab and ipilimumab    3. Hypertension  - BP low-normal, will hold antihypertensives     DVT prophylaxis: SCDs Code Status: Full  Level of Care: Level of care: Med-Surg Family Communication: None present  Disposition Plan:  Patient is from: home  Anticipated d/c is to: Home  Anticipated d/c date is: 10/15/20 Patient currently: SOB, pending eval for possible thoracentesis, possible Pleurx catheter  Consults called: None  Admission status: Observation    Vianne Bulls, MD [facility]  10/14/2020, 7:17 AM

## 2020-10-14 NOTE — ED Provider Notes (Signed)
[place] DEPT Provider Note   CSN: [id number] Arrival date & time: 10/14/20  0058     History Chief Complaint  Patient presents with  . Chest Pain  . Shortness of Breath    Bob Ewing is a 50 y.o. male.  Patient presents to the emergency department for evaluation of chest discomfort and shortness of breath.  Patient reports that he does have cancer in his lungs.  He is a smoker and has been coughing quite a bit.  Pain is primarily on the right side and he attributes that to broken ribs that he knows he has.  He is here primarily because he feels more short of breath than usual.        Past Medical History:  Diagnosis Date  . Cancer ([place])   . Hypertension     Patient Active Problem List   Diagnosis Date Noted  . Large pleural effusion 10/14/2020  . Hypercalcemia 10/02/2020  . 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 Goals of care, counseling/discussion 09/21/2020    History reviewed. No pertinent surgical history.     History reviewed. No pertinent family history.  Social History   Tobacco Use  . Smoking status: Current Every Day Smoker  . Smokeless tobacco: Never Used  Vaping Use  . Vaping Use: Never used  Substance Use Topics  . Alcohol use: Yes  . Drug use: Never    Home Medications Prior to Admission medications   Medication Sig Start Date End Date Taking? Authorizing Provider  atenolol (TENORMIN) 50 MG tablet Take 50 mg by mouth daily.   Yes [provider]  hydrochlorothiazide (HYDRODIURIL) 25 MG tablet Take 25 mg by mouth daily.   Yes [provider]  HYDROcodone-acetaminophen (NORCO) 10-325 MG tablet Take 1 tablet by mouth every 4 (four) hours as needed for moderate pain or severe pain.   Yes [provider]    Allergies    Phentermine  Review of Systems   Review of Systems  Respiratory: Positive for chest tightness and  shortness of breath.   All other systems reviewed and are negative.   Physical Exam Updated Vital Signs BP 104/69   Pulse 79   Temp 98.4 F (36.9 C) (Oral)   Resp 16   Ht 5\' 8"  (1.727 m)   Wt 90.7 kg   SpO2 95%   BMI 30.41 kg/m   Physical Exam Vitals and nursing note reviewed.  Constitutional:      General: He is not in acute distress.    Appearance: Normal appearance. He is well-developed.  HENT:     Head: Normocephalic and atraumatic.     Right Ear: Hearing normal.     Left Ear: Hearing normal.     Nose: Nose normal.  Eyes:     Conjunctiva/sclera: Conjunctivae normal.     Pupils: Pupils are equal, round, and reactive to light.  Cardiovascular:     Rate and Rhythm: Regular rhythm.     Heart sounds: S1 normal and S2 normal. No murmur heard. No friction rub. No gallop.   Pulmonary:     Effort: Pulmonary effort is normal. No respiratory distress.     Breath sounds: Examination of the left-lower field reveals decreased breath sounds. Decreased breath sounds present.  Chest:     Chest wall: No tenderness.  Abdominal:     General: Bowel sounds are normal.     Palpations: Abdomen is soft.  Tenderness: There is no abdominal tenderness. There is no guarding or rebound. Negative signs include Murphy's sign and McBurney's sign.     Hernia: No hernia is present.  Musculoskeletal:        General: Normal range of motion.     Cervical back: Normal range of motion and neck supple.  Skin:    General: Skin is warm and dry.     Findings: No rash.  Neurological:     Mental Status: He is alert and oriented to person, place, and time.     GCS: GCS eye subscore is 4. GCS verbal subscore is 5. GCS motor subscore is 6.     Cranial Nerves: No cranial nerve deficit.     Sensory: No sensory deficit.     Coordination: Coordination normal.  Psychiatric:        Speech: Speech normal.        Behavior: Behavior normal.        Thought Content: Thought content normal.     ED Results /  Procedures / Treatments   Labs (all labs ordered are listed, but only abnormal results are displayed) Labs Reviewed  BASIC METABOLIC PANEL - Abnormal; Notable for the following components:      Result Value   Glucose, Bld 112 (*)    Creatinine, Ser 1.29 (*)    Calcium 8.6 (*)    All other components within normal limits  CBC - Abnormal; Notable for the following components:   RBC 3.52 (*)    Hemoglobin 10.3 (*)    HCT 32.1 (*)    All other components within normal limits  HEPATIC FUNCTION PANEL - Abnormal; Notable for the following components:   Albumin 3.3 (*)    ALT 50 (*)    All other components within normal limits  RESP PANEL BY RT-PCR (FLU A&B, COVID) [id number]  BRAIN NATRIURETIC PEPTIDE  TROPONIN I (HIGH SENSITIVITY)  TROPONIN I (HIGH SENSITIVITY)    EKG EKG Interpretation  Date/Time:  Wednesday October 14 2020 01:06:37 EDT Ventricular Rate:  87 PR Interval:    QRS Duration: 94 QT Interval:  362 QTC Calculation: 436 R Axis:   56 Text Interpretation: Sinus rhythm Normal ECG When compared with ECG of 09/03/2020, T wave inversion is no longer present lead III Confirmed by Delora Fuel ([id number]) on 10/14/2020 5:08:15 AM   Radiology DG Chest 2 View  Result Date: 10/14/2020 CLINICAL DATA:  Chest pain EXAM: CHEST - 2 VIEW COMPARISON:  10/04/2020, CT 10/04/2020 FINDINGS: Moderate left-sided pleural effusion with airspace disease at the left base. Bulky hilar adenopathy. Multiple pulmonary metastatic nodules better seen on CT. Stable cardiac size. No pneumothorax. IMPRESSION: Moderate left-sided pleural effusion with airspace disease at the left base, atelectasis versus pneumonia. Bulky hilar adenopathy. Multiple bilateral pulmonary metastatic lung nodules are better seen on CT. Electronically Signed   By: Donavan Foil M.D.   On: 10/14/2020 01:31   CT ANGIO CHEST PE W OR WO CONTRAST  Result Date: 10/14/2020 CLINICAL DATA:  Chest pressure. EXAM: CT ANGIOGRAPHY CHEST WITH CONTRAST  TECHNIQUE: Multidetector CT imaging of the chest was performed using the standard protocol during bolus administration of intravenous contrast. Multiplanar CT image reconstructions and MIPs were obtained to evaluate the vascular anatomy. CONTRAST:  67mL OMNIPAQUE IOHEXOL 350 MG/ML SOLN COMPARISON:  Ten days ago FINDINGS: Cardiovascular: Normal heart size. No pericardial effusion. Central vessels are distorted by pulmonary and nodal metastatic disease. Apparent filling defect within apical segment left upper lobe pulmonary arteries is  stable from before and expanded, more consistent with metastatic disease than pulmonary embolism. Mediastinum/Nodes: Hilar and mediastinal nodal metastases which are bulky in narrow adjacent airways. A right paratracheal node measures up to 2.7 cm. Lungs/Pleura: Innumerable pulmonary metastases randomly distributed in the bilateral lungs. Large and increased left pleural effusion without discrete pleural nodule on this arterial phase study. Expected left lower lobe atelectasis. No edema or air leak. Patchy areas of air trapping likely from bronchial narrowing. Upper Abdomen: Partially covered bulky left renal mass with extracapsular extension. Musculoskeletal: Large posterior right ninth rib metastasis with transcortical and extraosseous growth. Well-defined proximal left humeral metastasis. Review of the MIP images confirms the above findings. IMPRESSION: 1. Large and enlarging left pleural effusion with compared to study 10 days ago. 2. Negative for acute pulmonary embolism. 3. Widespread metastatic disease. Electronically Signed   By: Monte Fantasia M.D.   On: 10/14/2020 04:15    Procedures Procedures   Medications Ordered in ED Medications  albuterol (VENTOLIN HFA) 108 (90 Base) MCG/ACT inhaler 8 puff (8 puffs Inhalation Given 10/14/20 0222)  morphine 4 MG/ML injection 4 mg (4 mg Intravenous Given 10/14/20 0324)  ondansetron (ZOFRAN) injection 4 mg (4 mg Intravenous Given  10/14/20 0325)  iohexol (OMNIPAQUE) 350 MG/ML injection 100 mL (70 mLs Intravenous Contrast Given 10/14/20 0352)    ED Course  I have reviewed the triage vital signs and the nursing notes.  Pertinent labs & imaging results that were available during my care of the patient were reviewed by me and considered in my medical decision making (see chart for details).    MDM Rules/Calculators/A&P                          Patient presents to the emergency department for evaluation of right-sided chest pain and shortness of breath.  Patient recently diagnosed with renal cell carcinoma that was found to be metastatic.  He does have mets to his lungs.  Patient was seen in the ER 10 days ago and had a small pleural effusion.  Cardiac work-up today does not suggest ischemia.  No PE on CT study, but there is a significantly enlarging pleural effusion that explains his symptoms.  As this has accumulated quite rapidly, will require evaluation for definitive care including thoracentesis versus Pleurx tube.  Will admit to hospitalist for further management. Final Clinical Impression(s) / ED Diagnoses Final diagnoses:  Pleural effusion    Rx / DC Orders ED Discharge Orders    None       Pollina, Gwenyth Allegra, MD 10/14/20 [phone]

## 2020-10-14 NOTE — Consult Note (Signed)
NAME:  Bob Ewing, MRN:  [id number], DOB:  [birth year], LOS: 0 ADMISSION DATE:  10/14/2020, CONSULTATION DATE: 10/14/20 REFERRING MD:  Fayne Mediate CHIEF COMPLAINT:  Chest pressure, DOE  Brief History:  50 year old recently started immunotherapy for metastatic RCC presents with worsening DOE, chest pressure with L pleural effusion.   History of Present Illness:  50 year old with RCC recently started on ipilimumab and nivolumab with worsening cough, shortness of breath, chest pressure. Symptoms started shortly after starting therapy. Progressively worsened over time. Shortly after symptoms presented to ED and CTA 10/03/20 on my review and interpretation reveals metastatic lesions with small to moderate L effusion that is new compared to my review and interpolation of CT abd pelvis 09/01/20. Yesterday, symptoms much worse. Noticeable while walking outside. Endorses worsening SOB when lying flat. Chest pressure present. Not worse with exertion. Cough a bit worse. This prompted ED visit with repeat CTA 10/14/19 with enlarging L pleural effusion on my review and interpretation. Troponin negative, BNP WNL. BMP stable. Anemia worse Hgb 10.3 from [facility] 10/02/2020.   Past Medical History:  RCC, tobacco abuse, HTN  Significant Hospital Events:  3/16 Admitted with enlarging pleural effusion  Consults:  IR PCCM  Procedures:  Planned thora via IR 3/16  Significant Diagnostic Tests:  CTA 10/04/20 - metastatic lesions, small to moderate L pleural effusion CTA 10/14/20 - metastatic lesions, enlarging L pleural effusion now moderate to large  Micro Data:  n/a  Antimicrobials:  n/a  Interim History / Subjective:  As above  Objective   Blood pressure 115/68, pulse 79, temperature 98.4 F (36.9 C), temperature source Oral, resp. rate 16, height 5\' 8"  (1.727 m), weight 90.7 kg, SpO2 98 %.       No intake or output data in the 24 hours ending 10/14/20 0854 Filed Weights   10/14/20 0105  Weight: 90.7 kg     Examination: General: lying in bed, in NAD Eyes: EOMI, no icterus Neck: supple, no JVP Lungs: NWOB, clear with exception of no sounds in left base Cardiovascular: RRR, no murmur Abdomen: ND, BS present MSK: no synovitis, no joint effusion Neuro: no weakness, sensation intact Psych: normal mood, full affect   Resolved Hospital Problem list   n/a  Assessment & Plan:  Pleural Effusion: Unilateral in setting of metastatic RCC to lung. Most likely malignant. Recommend thoracentesis for therapeutic and diagnostic purposes. Sounds like being arranged via IR. --Please obtain from pleural fluid: Cell count and differential, LDH, protein, cytology  DOE, chest pressure: likely related to metastatic disease, pleural effusion. Thoracentesis being pursued.   Will provide contact for [name] so he can contact [name] and colleagues if symptoms return to evaluate if effusion recurs and therapeutic options in future. No role for PleurX at this time but could be option if fluid recurs over time.   Best practice (evaluated daily)  Per primary  Goals of Care:  Per primary  Labs   CBC: Recent Labs  Lab 10/14/20 0132  WBC 7.2  HGB 10.3*  HCT 32.1*  MCV 91.2  PLT 076    Basic Metabolic Panel: Recent Labs  Lab 10/14/20 0132  NA 136  K 4.0  CL 101  CO2 24  GLUCOSE 112*  BUN 20  CREATININE 1.29*  CALCIUM 8.6*   GFR: Estimated Creatinine Clearance: 76.6 mL/min (A) (by C-G formula based on SCr of 1.29 mg/dL (H)). Recent Labs  Lab 10/14/20 0132  WBC 7.2    Liver Function Tests: Recent  Labs  Lab 10/14/20 0132  AST 28  ALT 50*  ALKPHOS 86  BILITOT 0.8  PROT 6.9  ALBUMIN 3.3*   No results for input(s): LIPASE, AMYLASE in the last 168 hours. No results for input(s): AMMONIA in the last 168 hours.  ABG No results found for: PHART, PCO2ART, PO2ART, HCO3, TCO2, ACIDBASEDEF, O2SAT   Coagulation Profile: No results for input(s): INR, PROTIME in the last  168 hours.  Cardiac Enzymes: No results for input(s): CKTOTAL, CKMB, CKMBINDEX, TROPONINI in the last 168 hours.  HbA1C: No results found for: HGBA1C  CBG: No results for input(s): GLUCAP in the last 168 hours.  Review of Systems:   No LE swelling. No PND. No HA, neurologic changes. Comprehensive ROS otherwise negative.   Past Medical History:  He,  has a past medical history of Cancer ([place]) and Hypertension.   Surgical History:  History reviewed. No pertinent surgical history.   Social History:   reports that he has been smoking. He has never used smokeless tobacco. He reports current alcohol use. He reports that he does not use drugs.   Family History:  His family history is not on file.   Allergies Allergies  Allergen Reactions  . Phentermine     Kidney pain     Home Medications  Prior to Admission medications   Medication Sig Start Date End Date Taking? Authorizing Provider  atenolol (TENORMIN) 50 MG tablet Take 50 mg by mouth daily.   Yes [provider]  hydrochlorothiazide (HYDRODIURIL) 25 MG tablet Take 25 mg by mouth daily.   Yes [provider]  HYDROcodone-acetaminophen (NORCO) 10-325 MG tablet Take 1 tablet by mouth every 4 (four) hours as needed for moderate pain or severe pain.   Yes [provider]     Critical care time: n/a

## 2020-10-15 ENCOUNTER — Observation Stay (HOSPITAL_BASED_OUTPATIENT_CLINIC_OR_DEPARTMENT_OTHER): Payer: 59

## 2020-10-15 ENCOUNTER — Observation Stay (HOSPITAL_COMMUNITY): Payer: 59

## 2020-10-15 ENCOUNTER — Encounter (HOSPITAL_COMMUNITY): Payer: Self-pay | Admitting: Radiology

## 2020-10-15 DIAGNOSIS — I10 Essential (primary) hypertension: Secondary | ICD-10-CM | POA: Diagnosis not present

## 2020-10-15 DIAGNOSIS — I639 Cerebral infarction, unspecified: Secondary | ICD-10-CM

## 2020-10-15 DIAGNOSIS — J90 Pleural effusion, not elsewhere classified: Secondary | ICD-10-CM | POA: Diagnosis not present

## 2020-10-15 DIAGNOSIS — I6389 Other cerebral infarction: Secondary | ICD-10-CM

## 2020-10-15 LAB — LIPID PANEL
Cholesterol: 149 mg/dL (ref 0–200)
HDL: 18 mg/dL — ABNORMAL LOW (ref >40)
LDL Cholesterol: 97 mg/dL (ref 0–99)
Total CHOL/HDL Ratio: 8.3 RATIO
Triglycerides: 171 mg/dL — ABNORMAL HIGH (ref <150)
VLDL: 34 mg/dL (ref 0–40)

## 2020-10-15 LAB — HEMOGLOBIN A1C
Hgb A1c MFr Bld: 6.1 % — ABNORMAL HIGH (ref 4.8–5.6)
Mean Plasma Glucose: 128.37 mg/dL

## 2020-10-15 LAB — CBC
HCT: 33.9 % — ABNORMAL LOW (ref 39.0–52.0)
Hemoglobin: 10.9 g/dL — ABNORMAL LOW (ref 13.0–17.0)
MCH: 29.5 pg (ref 26.0–34.0)
MCHC: 32.2 g/dL (ref 30.0–36.0)
MCV: 91.6 fL (ref 80.0–100.0)
Platelets: 278 10*3/uL (ref 150–400)
RBC: 3.7 MIL/uL — ABNORMAL LOW (ref 4.22–5.81)
RDW: 14.5 % (ref 11.5–15.5)
WBC: 10.9 10*3/uL — ABNORMAL HIGH (ref 4.0–10.5)
nRBC: 0 % (ref 0.0–0.2)

## 2020-10-15 LAB — BASIC METABOLIC PANEL
Anion gap: 9 (ref 5–15)
BUN: 24 mg/dL — ABNORMAL HIGH (ref 6–20)
CO2: 23 mmol/L (ref 22–32)
Calcium: 7.6 mg/dL — ABNORMAL LOW (ref 8.9–10.3)
Chloride: 101 mmol/L (ref 98–111)
Creatinine, Ser: 1.46 mg/dL — ABNORMAL HIGH (ref 0.61–1.24)
GFR, Estimated: 59 mL/min — ABNORMAL LOW (ref >60)
Glucose, Bld: 125 mg/dL — ABNORMAL HIGH (ref 70–99)
Potassium: 4.1 mmol/L (ref 3.5–5.1)
Sodium: 133 mmol/L — ABNORMAL LOW (ref 135–145)

## 2020-10-15 LAB — ECHOCARDIOGRAM COMPLETE BUBBLE STUDY
Area-P 1/2: 3.74 cm2
S' Lateral: 3.02 cm

## 2020-10-15 LAB — HIV ANTIBODY (ROUTINE TESTING W REFLEX): HIV Screen 4th Generation wRfx: NONREACTIVE

## 2020-10-15 IMAGING — US US THORACENTESIS ASP PLEURAL SPACE W/IMG GUIDE
1 series · 4 of 4 positions shown · non-contrast
Comparison: none

INDICATION: Shortness of breath, left pleural effusion. Request for therapeutic
left thoracentesis.

[Series 1: us thoracentesis asp pleural space w/img guide · 4 of 4 slices shown]
[im 1/4]
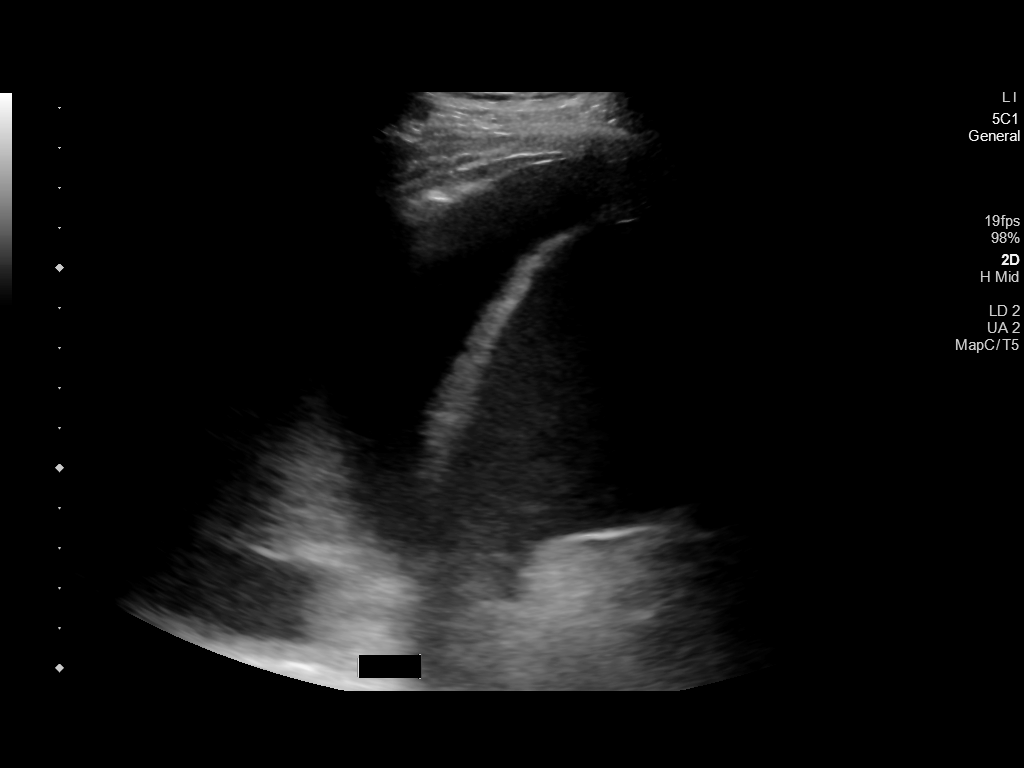
[im 2/4]
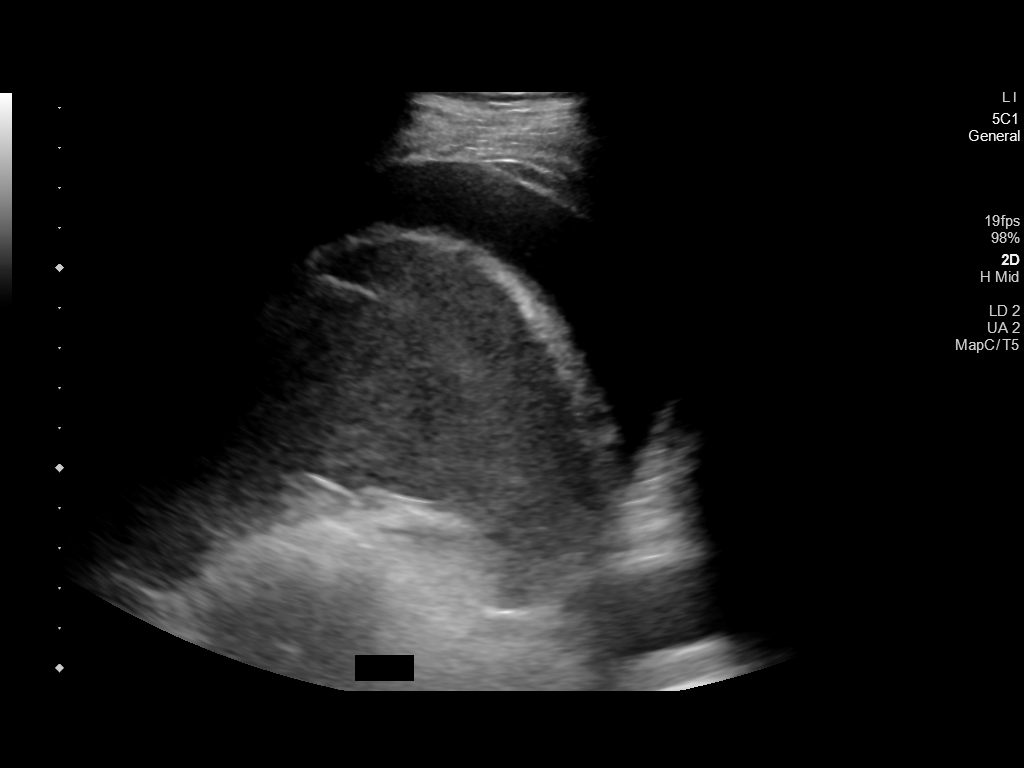
[im 3/4]
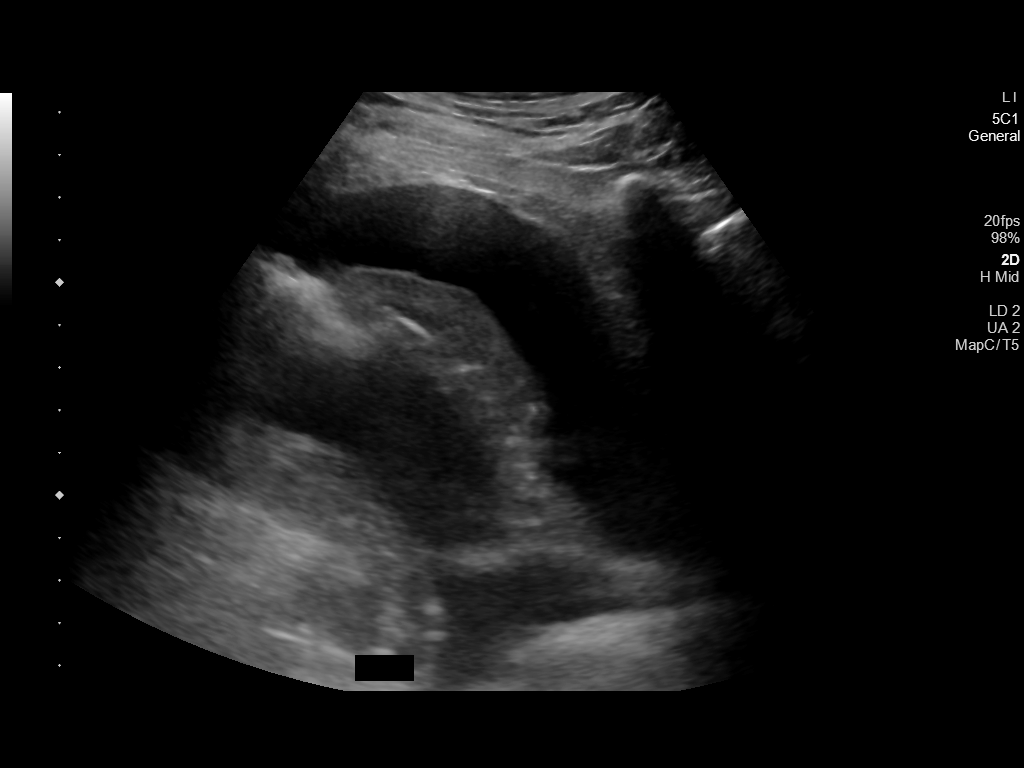
[im 4/4]
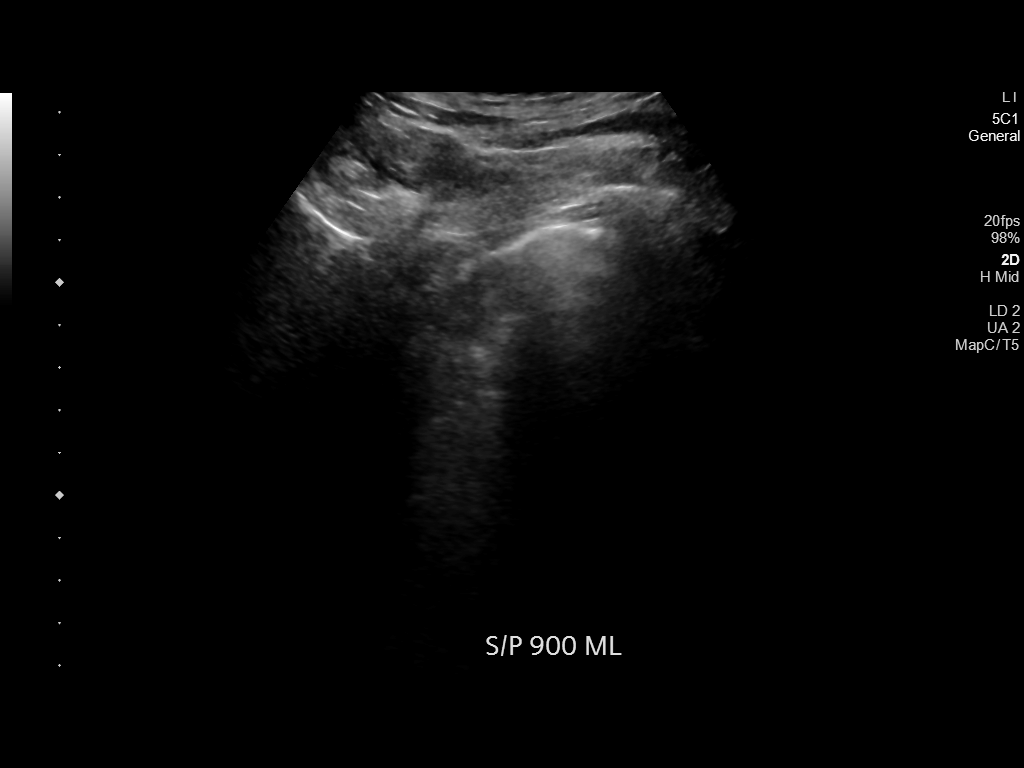

[4 of 4 positions shown; findings below may reference images not displayed]

EXAM:
ULTRASOUND GUIDED LEFT THORACENTESIS

MEDICATIONS:
10 mL 1 % lidocaine

COMPLICATIONS:
None immediate.

PROCEDURE:
An ultrasound guided thoracentesis was thoroughly discussed with the
patient and questions answered. The benefits, risks, alternatives
and complications were also discussed. The patient understands and
wishes to proceed with the procedure. Written consent was obtained.

Ultrasound was performed to localize and mark an adequate pocket of
fluid in the left chest. The area was then prepped and draped in the
normal sterile fashion. 1% Lidocaine was used for local anesthesia.
Under ultrasound guidance a 6 Fr Safe-T-Centesis catheter was
introduced by Dr. TARLA. Thoracentesis was performed. The
catheter was removed and a dressing applied.
FINDINGS: A total of approximately 900 cc of hazy blood tinged fluid was
removed.
IMPRESSION: Successful ultrasound guided left thoracentesis yielding 900 cc of
pleural fluid.

## 2020-10-15 IMAGING — MR MR MRA NECK W/O CM
3 of 7 series · 17 of 48 positions shown · non-contrast
Comparison: Brain MRI from yesterday.

CLINICAL DATA: Infarct on brain MRI yesterday.

EXAM:
MRA NECK WITHOUTCONTRAST
TECHNIQUE: Angiographic images of the neck were obtained using MRA technique
without intravenous contrast.

[Series 16: tof_2d_tra · axial · 3.5mm · 0.43mm/px · z∈[-259,-9]mm · 11 of 103 slices shown]
[im 1/103]
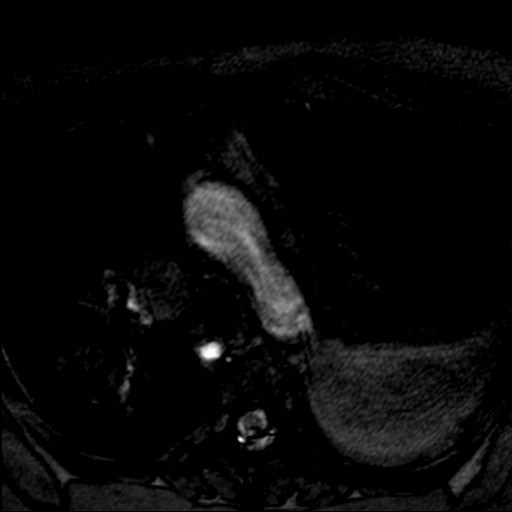
[im 11/103]
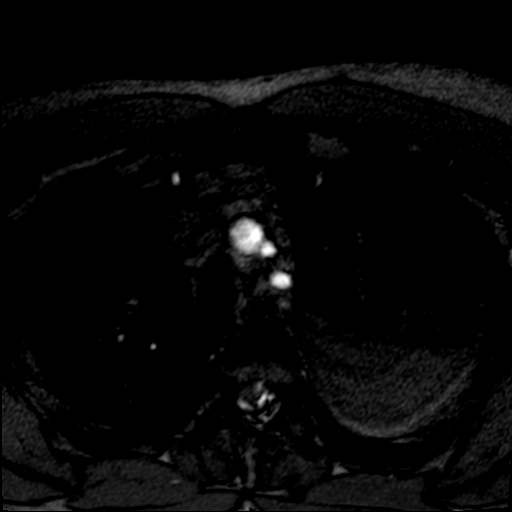
[im 21/103]
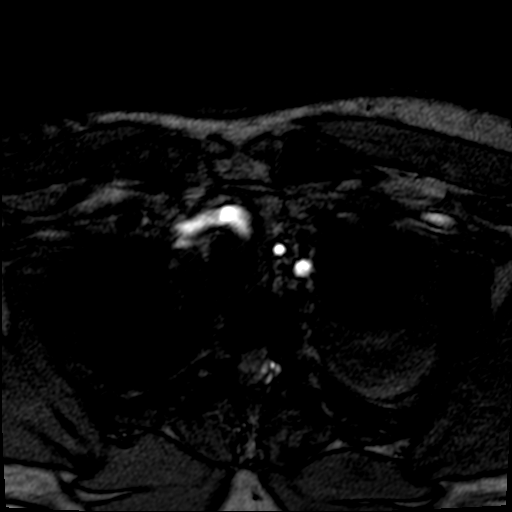
[im 31/103]
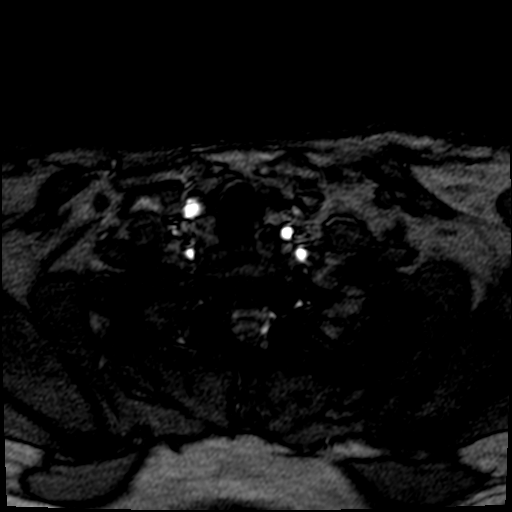
[im 41/103]
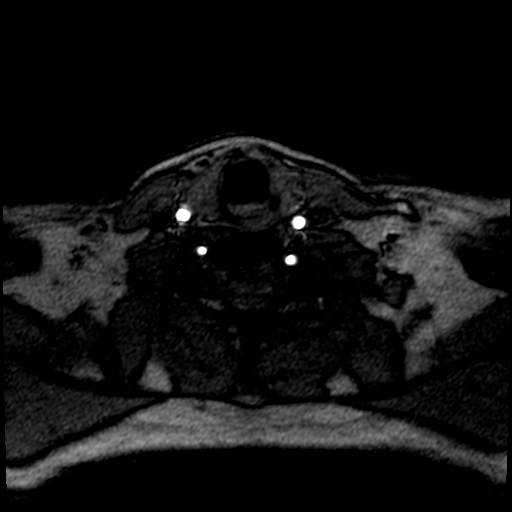
[im 52/103]
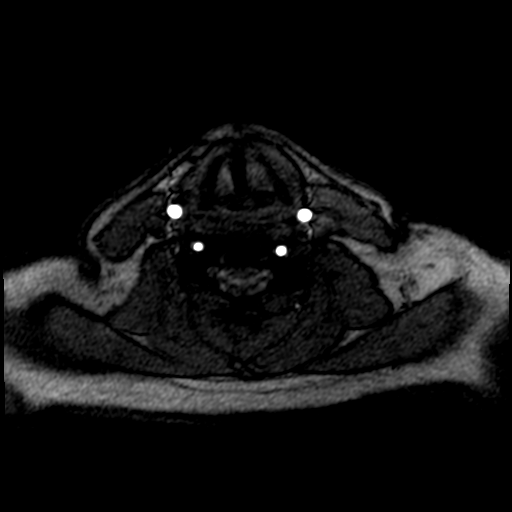
[im 62/103]
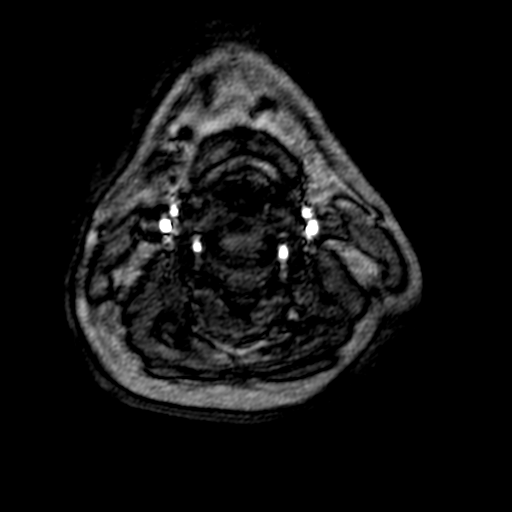
[im 72/103]
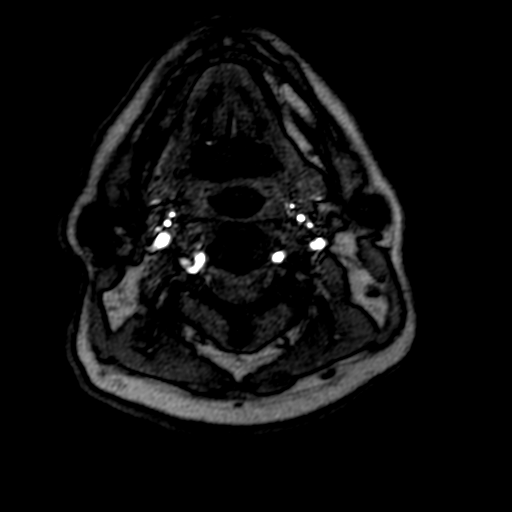
[im 82/103]
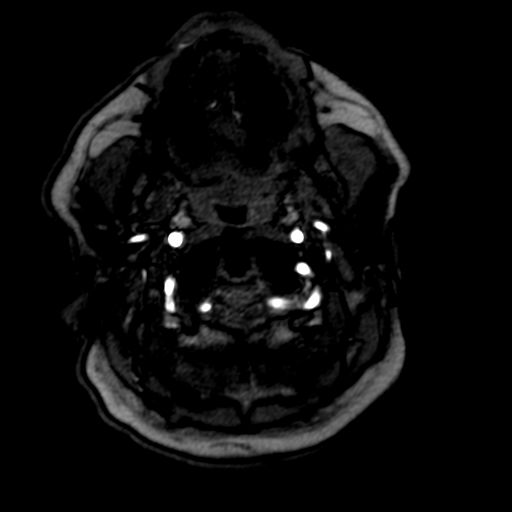
[im 92/103]
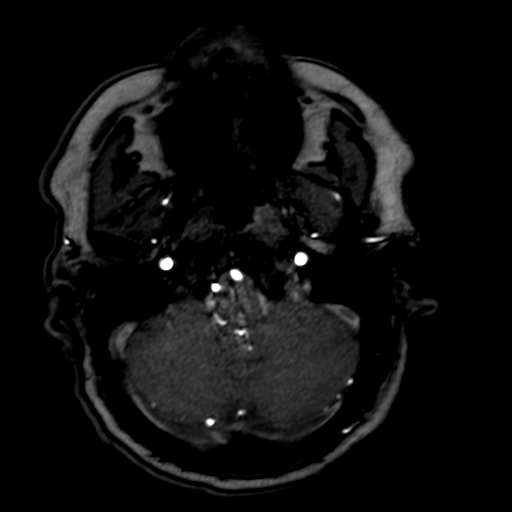
[im 103/103]
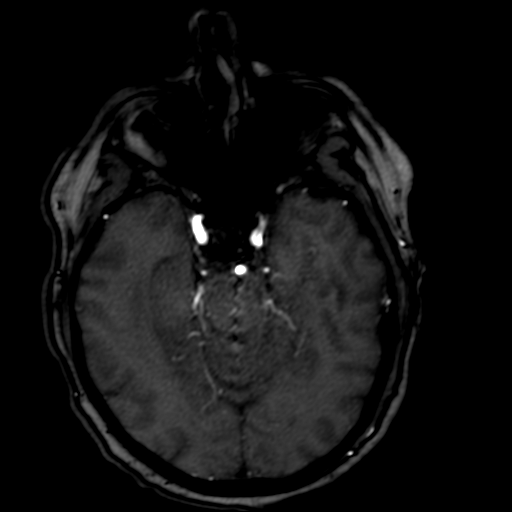

[Series 20: tof_2d_tra_mip_tra · axial · 253.4mm · 0.43mm/px · 1 of 1 slices shown]
[im 1/1]
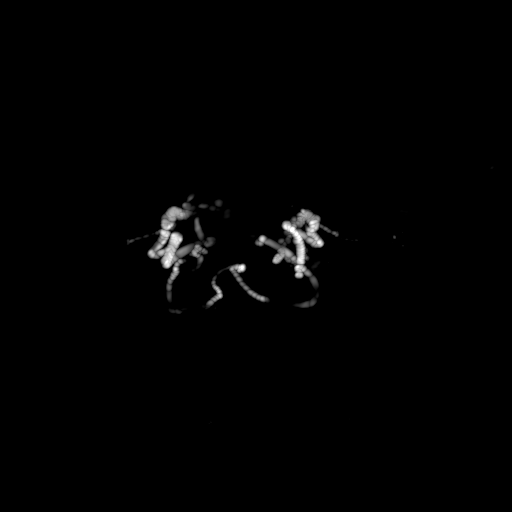

[Series 21: tof_3d_multi-slab-bifurcation · axial · 1.0mm · 0.26mm/px · z∈[-185,-81]mm · 5 of 124 slices shown]
[im 1/124]
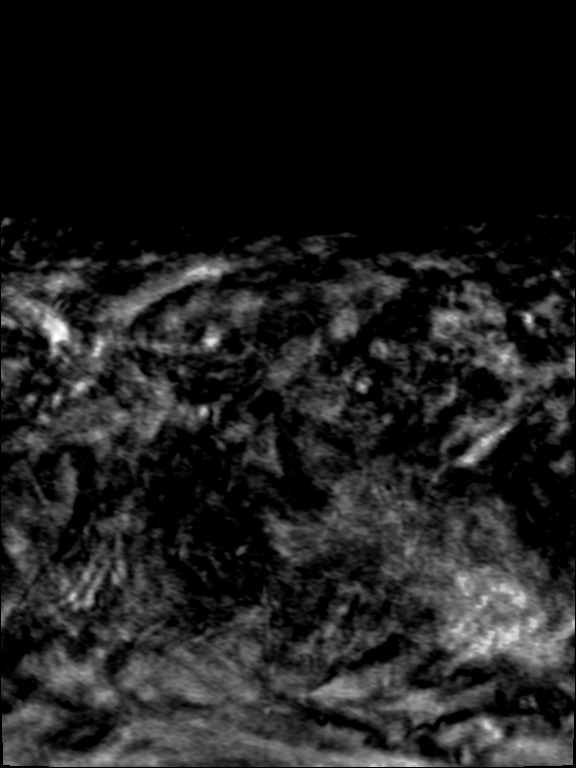
[im 19/124]
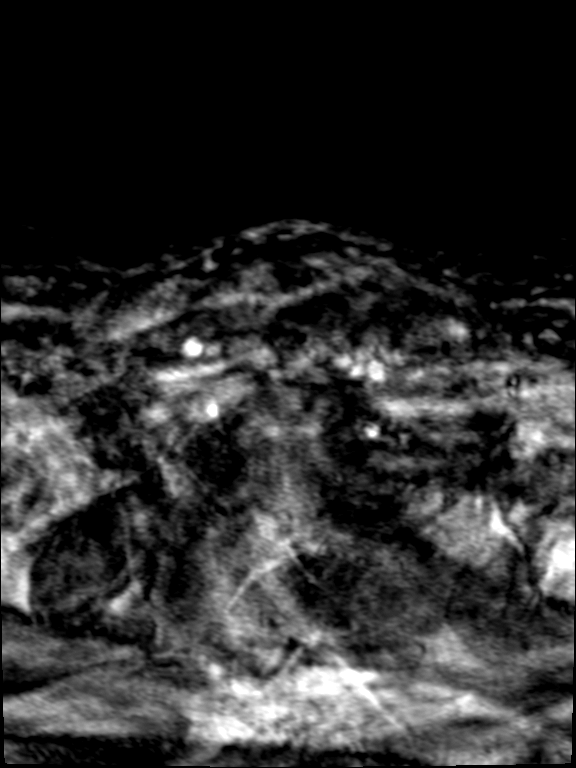
[im 38/124]
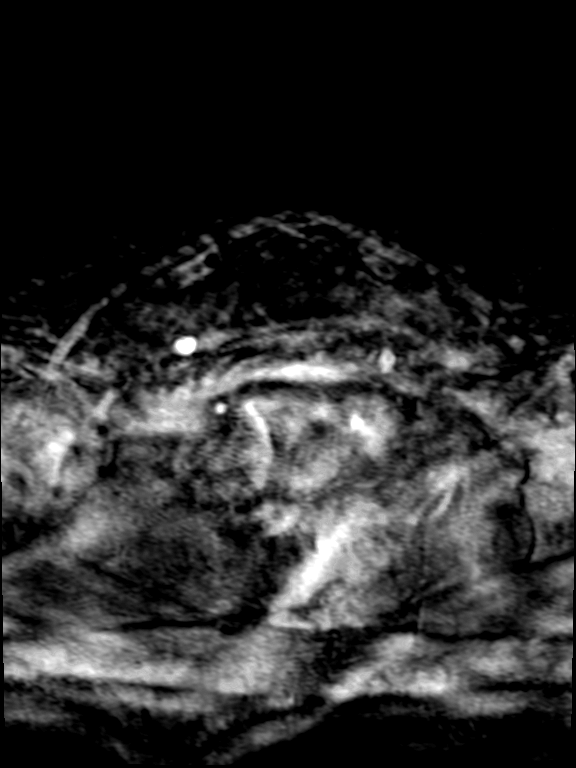
[im 67/124]
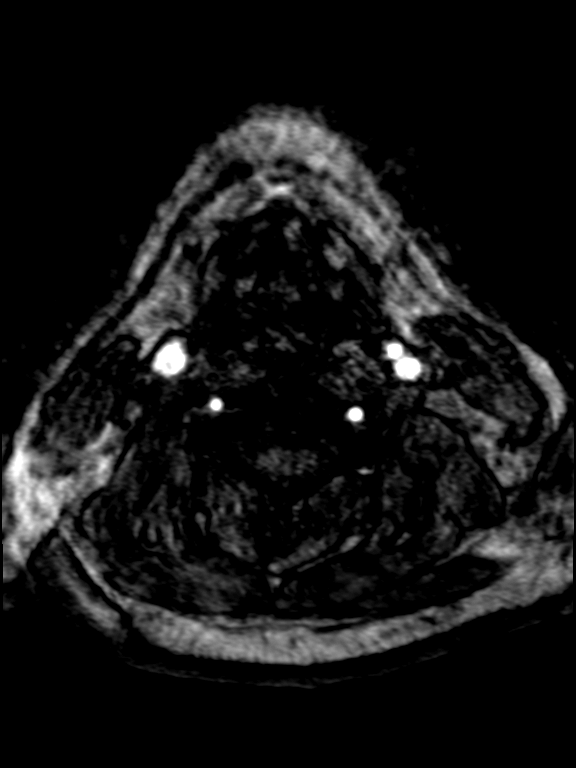
[im 105/124]
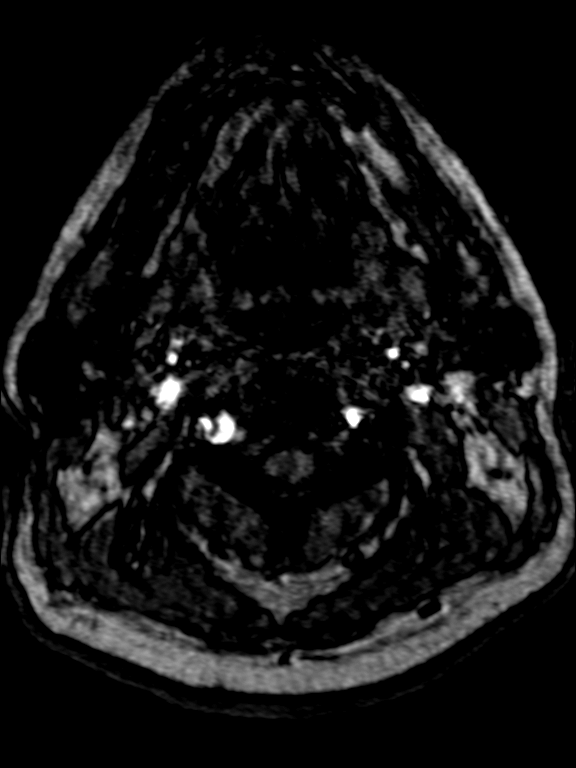

[17 of 48 positions shown; findings below may reference images not displayed]

FINDINGS: Normal visualized arch with 3 vessel branching.

The carotid and vertebral arteries are smooth and widely patent. No
evidence ulceration or dissection.

Adenopathy and large left pleural effusion which is known from
recent chest CT.

Motion degradation.
IMPRESSION: Negative neck MRA.

## 2020-10-15 IMAGING — DX DG CHEST 1V PORT
1 series · 1 of 1 positions shown · non-contrast
Comparison: [DATE].

CLINICAL DATA: Status post left thoracentesis.

EXAM:
PORTABLE CHEST 1 VIEW

[chest ap]
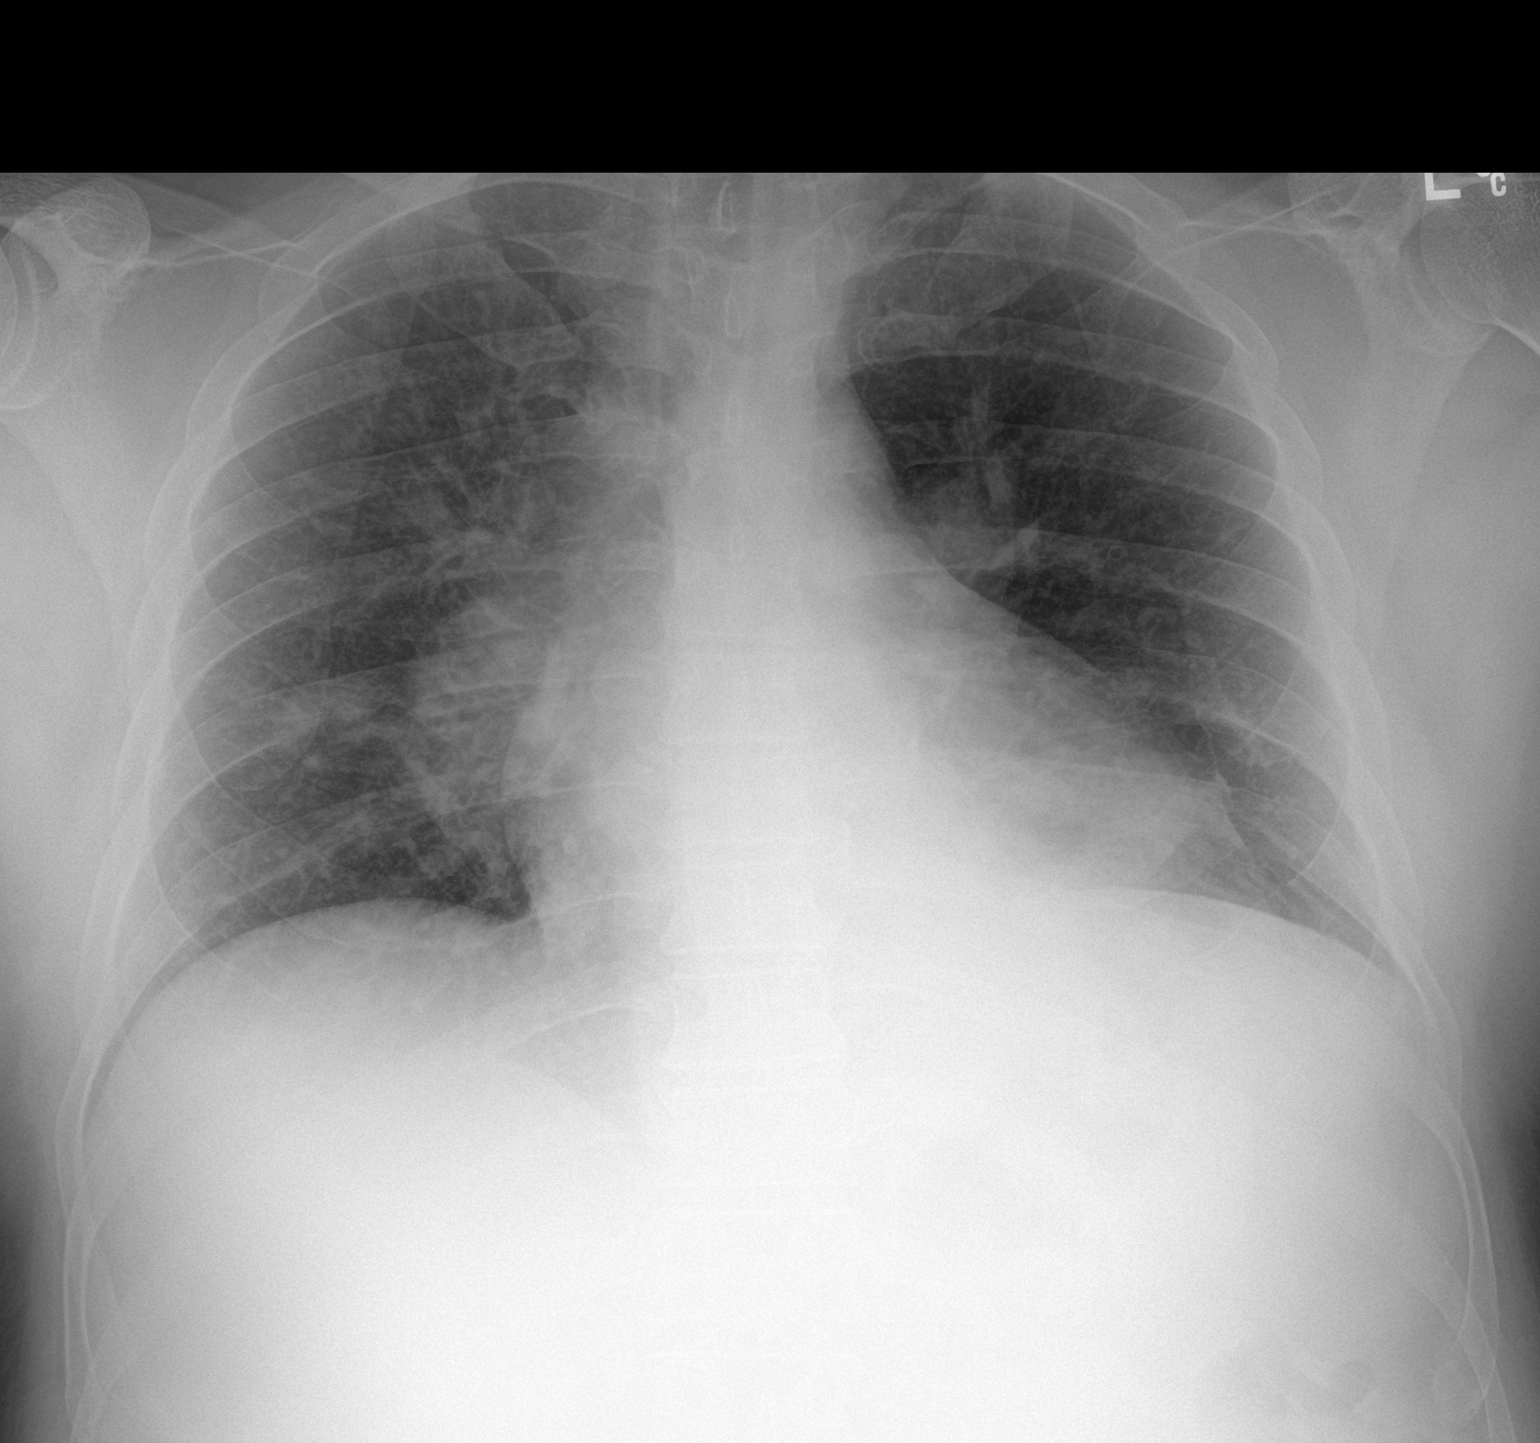

[1 of 1 positions shown; findings below may reference images not displayed]

FINDINGS: Decreased left pleural effusion. No significant residual pleural
effusion or pneumothorax on this single semi erect AP radiograph.
Persistent bilateral pulmonary opacities, better characterized on
prior CT chest. Stable cardiomediastinal contour.
IMPRESSION: 1. Status post left thoracentesis without significant residual
pleural effusion or pneumothorax on this limited single AP
semi-erect radiograph.
2. Persistent bilateral pulmonary opacities.

## 2020-10-15 IMAGING — MR MR MRA HEAD W/O CM
3 of 7 series · 17 of 48 positions shown · non-contrast
Comparison: Brain MRI from yesterday.

CLINICAL DATA: Renal cell carcinoma staging yesterday with infarct.

EXAM:
MRA HEAD WITHOUT CONTRAST
TECHNIQUE: Angiographic images of the Circle of Willis were obtained using MRA
technique without intravenous contrast.

[Series 16: tof_2d_tra · axial · 3.5mm · 0.43mm/px · z∈[-259,-9]mm · 11 of 103 slices shown]
[im 1/103]
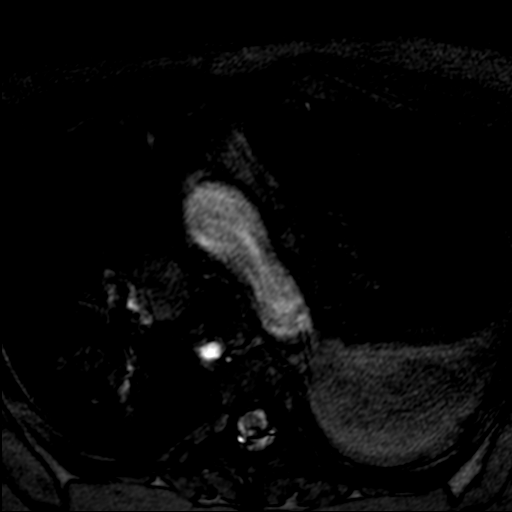
[im 11/103]
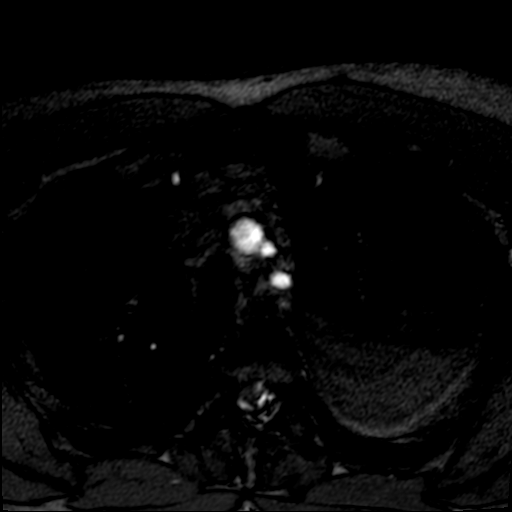
[im 21/103]
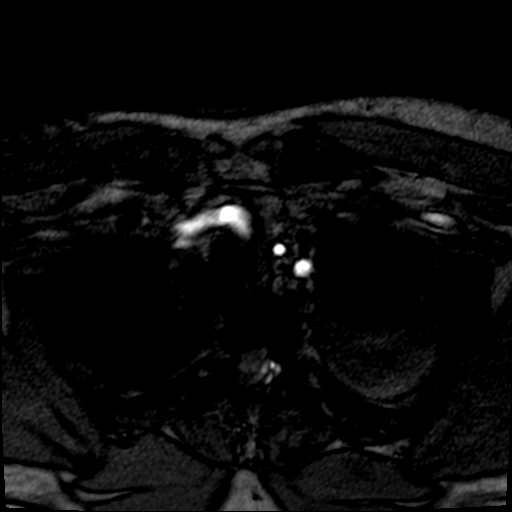
[im 31/103]
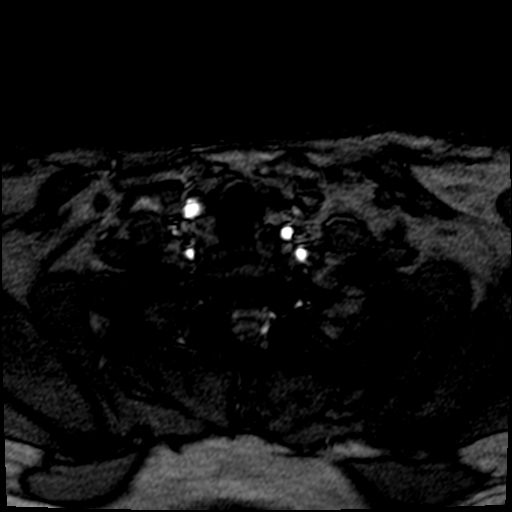
[im 41/103]
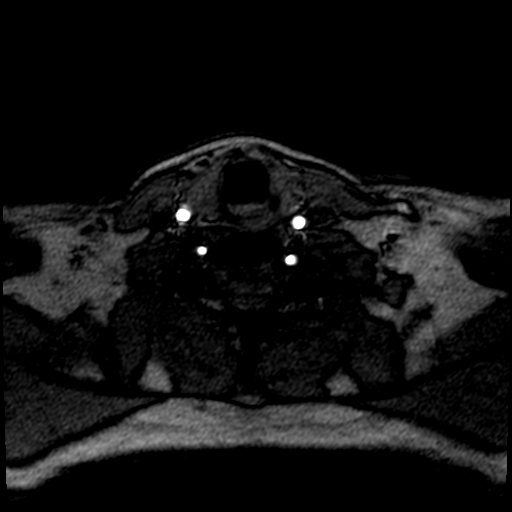
[im 52/103]
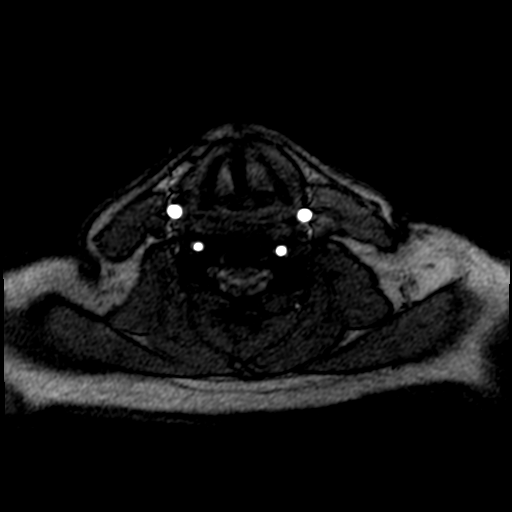
[im 62/103]
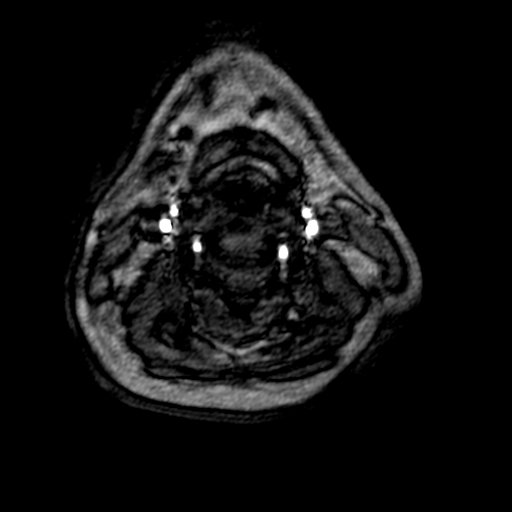
[im 72/103]
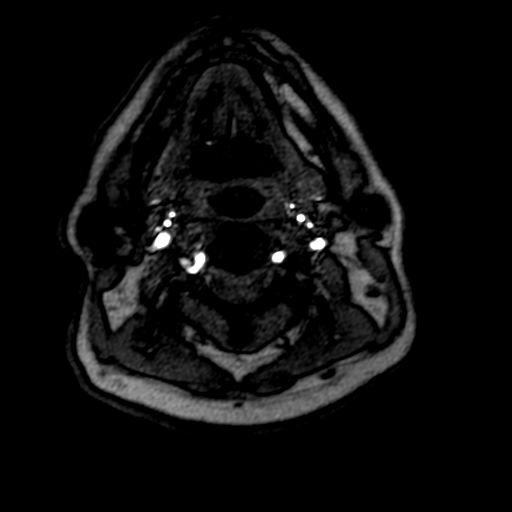
[im 82/103]
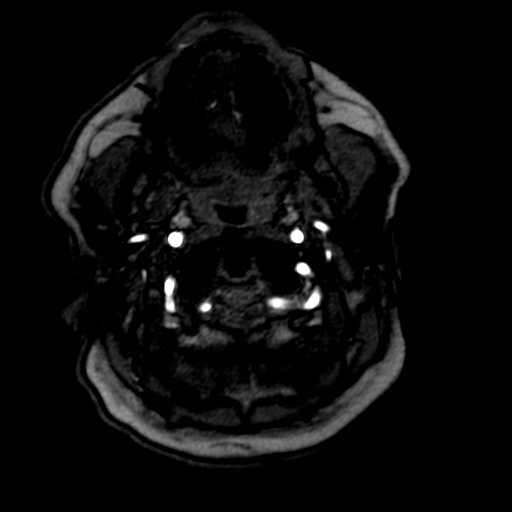
[im 92/103]
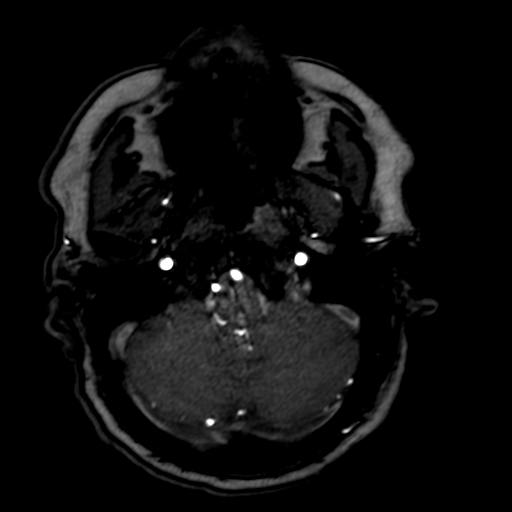
[im 103/103]
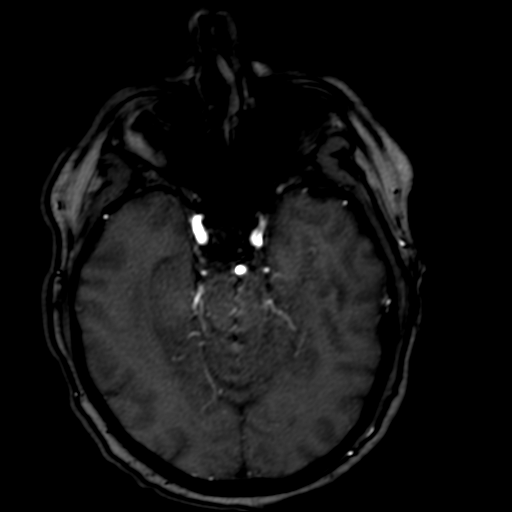

[Series 20: tof_2d_tra_mip_tra · axial · 253.4mm · 0.43mm/px · 1 of 1 slices shown]
[im 1/1]
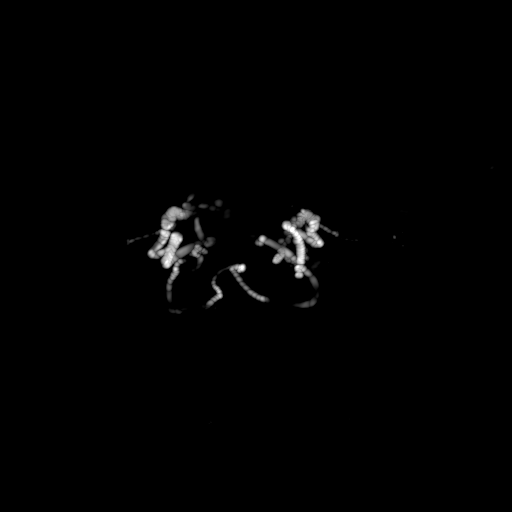

[Series 21: tof_3d_multi-slab-bifurcation · axial · 1.0mm · 0.26mm/px · z∈[-185,-81]mm · 5 of 124 slices shown]
[im 1/124]
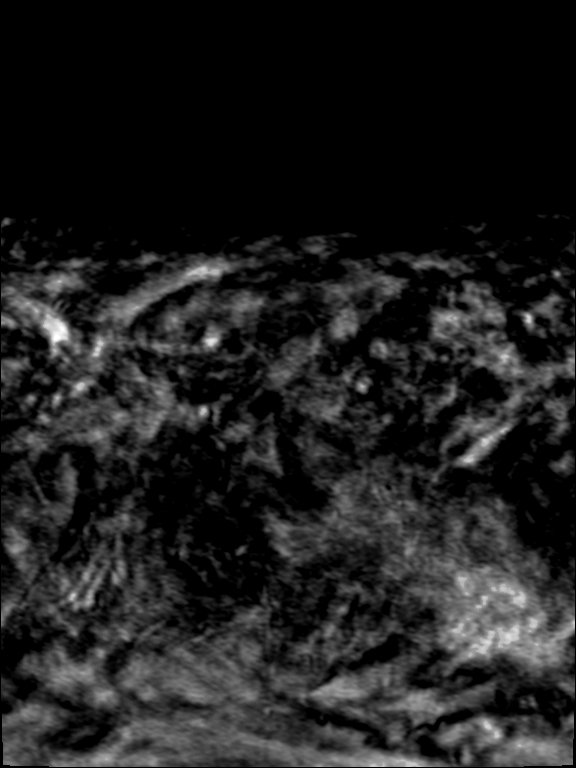
[im 19/124]
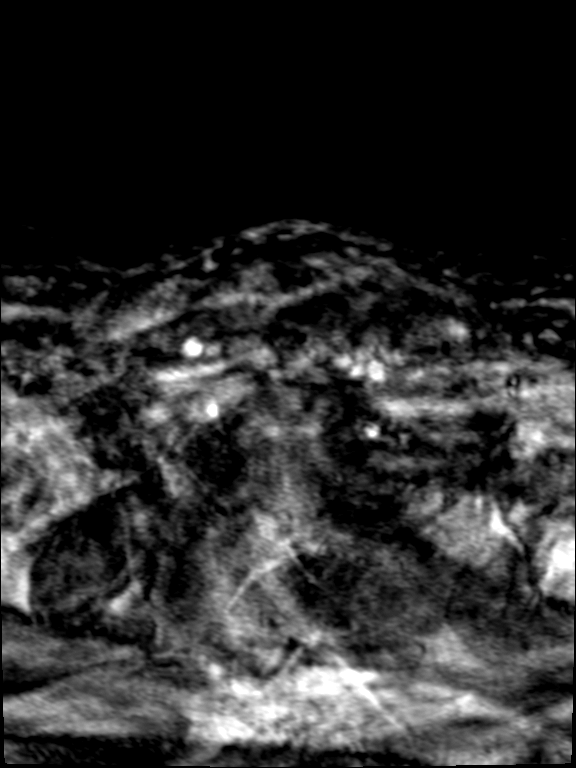
[im 38/124]
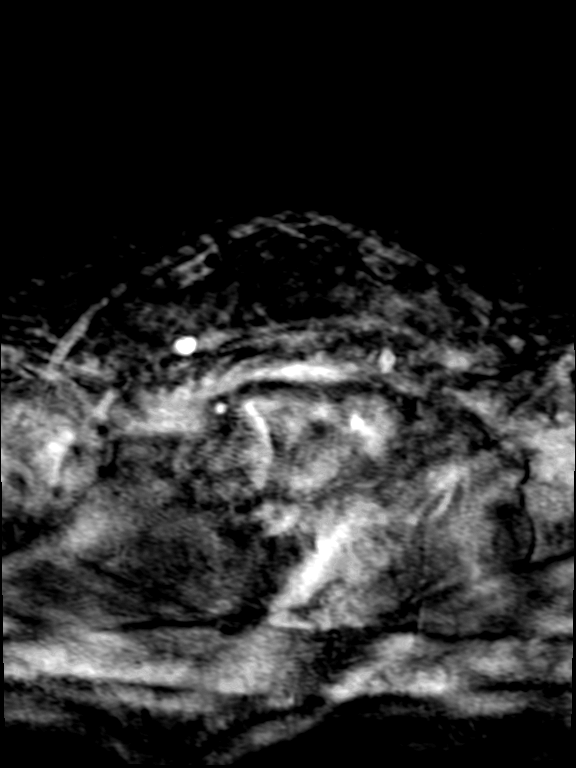
[im 67/124]
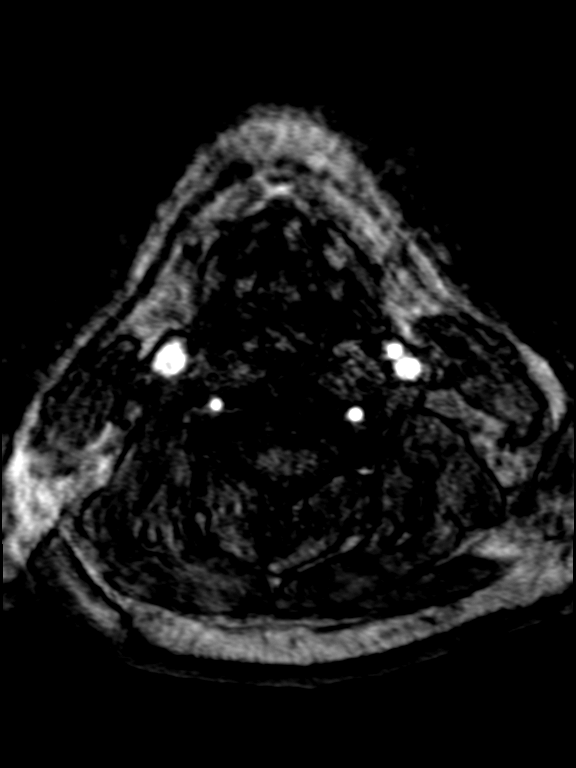
[im 105/124]
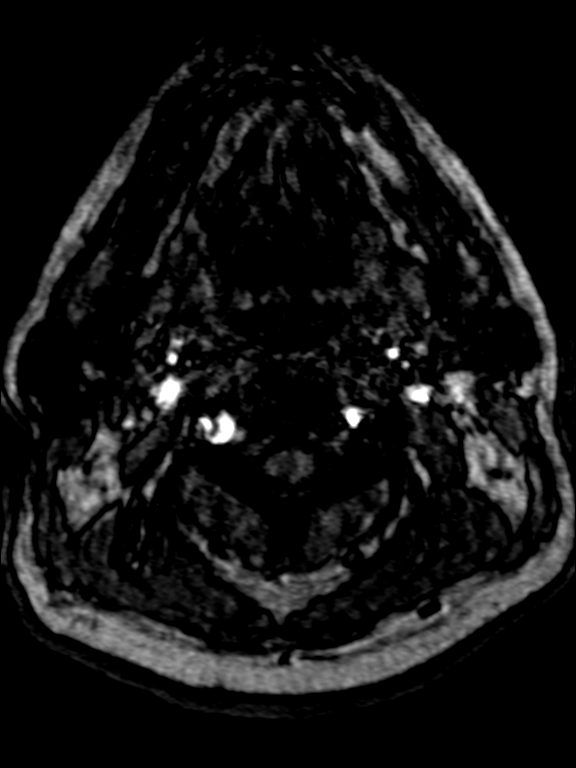

[17 of 48 positions shown; findings below may reference images not displayed]

FINDINGS: Vessels are smooth and widely patent. No branch occlusion, aneurysm
or signs of vascular malformation.
IMPRESSION: Negative intracranial MRA.

## 2020-10-15 MED ORDER — DIPHENHYDRAMINE HCL 25 MG PO CAPS
25.0000 mg | ORAL_CAPSULE | Freq: Four times a day (QID) | ORAL | Status: DC
  Administered 2020-10-15 (×2): 25 mg via ORAL
  Filled 2020-10-15 (×2): qty 1

## 2020-10-15 MED ORDER — SODIUM CHLORIDE 0.9 % IV BOLUS
500.0000 mL | Freq: Once | INTRAVENOUS | Status: AC
  Administered 2020-10-15: 500 mL via INTRAVENOUS

## 2020-10-15 MED ORDER — METHYLPREDNISOLONE SODIUM SUCC 40 MG IJ SOLR
40.0000 mg | Freq: Once | INTRAMUSCULAR | Status: AC
  Administered 2020-10-15: 40 mg via INTRAVENOUS
  Filled 2020-10-15: qty 1

## 2020-10-15 MED ORDER — ASPIRIN 81 MG PO CHEW: 81.0000 mg | CHEWABLE_TABLET | Freq: Every day | ORAL | 0 refills | Status: DC

## 2020-10-15 MED ORDER — DIPHENHYDRAMINE HCL 25 MG PO CAPS: 25.0000 mg | ORAL_CAPSULE | Freq: Four times a day (QID) | ORAL | 0 refills | Status: DC | PRN

## 2020-10-15 MED ORDER — LIDOCAINE HCL 1 % IJ SOLN
INTRAMUSCULAR | Status: AC
  Filled 2020-10-15: qty 20

## 2020-10-15 MED ORDER — LORAZEPAM 1 MG PO TABS
1.0000 mg | ORAL_TABLET | Freq: Once | ORAL | Status: AC | PRN
  Administered 2020-10-15: 1 mg via ORAL
  Filled 2020-10-15: qty 1

## 2020-10-15 NOTE — Progress Notes (Signed)
PT Cancellation Note  Patient Details Name: Atzin Buchta MRN: [id number] DOB: 26-Nov-1970   Cancelled Treatment:    Reason Eval/Treat Not Completed: Other (comment) (pt Ind). Per RN, pt is mobilizing around the room independently without AD and without unsteadiness. No deficits/concerns noted by nursing, will sign off. Please re-consult PT if needs arise.    Talbot Grumbling PT, DPT 10/15/20, 2:20 PM

## 2020-10-15 NOTE — Procedures (Signed)
PROCEDURE SUMMARY:  Successful image-guided left thoracentesis. Yielded 900 cc of hazy blood tinged  fluid. Pt tolerated procedure well. No immediate complications. EBL = trace   Specimen was not sent for labs. CXR ordered.  Please see imaging section of Epic for full dictation.  Armando Gang Darrill Vreeland PA-C 10/15/2020 2:41 PM

## 2020-10-15 NOTE — Evaluation (Signed)
SLP Cancellation Note  Patient Details Name: Bob Ewing MRN: [id number] DOB: May 10, 1971   Cancelled treatment:       Reason Eval/Treat Not Completed: Other (comment) (SLP spoke to MD who advised pt CVA was incidental, Advised Yale swallow screen be conducted and if failed to order SLP.  Relayed information to RN, Arnett.  Will sign off, please reorder if pt does NOT pass the Yale swallow screen. Thanks.)  Kathleen Lime, MS [facility] SLP Acute Rehab Services Office [phone] Pager [phone]    Macario Golds 10/15/2020, 11:09 AM

## 2020-10-15 NOTE — Progress Notes (Signed)
Events overnight noted.  Attempted thoracentesis was poorly tolerated with very limited amount removed.  Unclear remains stable.  MRI results reviewed and showed no evidence of intracranial metastasis.  There are areas of concern for acute or subacute infarct.  Further imaging studies are pending.  I agree with the current management at this time and appreciate input from neurology.  I favor attempting repeat thoracentesis prior to discharge given the large amount of effusion and the likelihood he will develop worsening symptoms or for any outpatient evaluation.  We will continue to follow.

## 2020-10-15 NOTE — Progress Notes (Signed)
Neurology Brief F/u Note  Per primary team, no neurologic changes, exam remains nonfocal  Patient not examined by me today.  Results of stroke w/u:  MRA H&N: no hemodynamically-significant stensoses TTE: unremarkable, normal LVEF, no visualized intracardiac clot LDL: 97 A1c 6.1  Recommendations: Inpatient stroke w/u completed. Continue ASA 81mg  daily at discharge for secondary stroke prevention. I will place an amb referral to neurology for outpatient f/u in 2-3 wks at which time ambulatory cardiac monitoring should be arranged, particularly given his relative hypercoagulable state in the setting of malignancy.  Neurology to sign off, ok to discharge from our standpoint if no other medical indications for continued hospitalization.   Bob Monks, MD [facility] [phone]  If 7pm- 7am, please page neurology on call as listed in [place].

## 2020-10-15 NOTE — Progress Notes (Signed)
  Echocardiogram 2D Echocardiogram has been performed.  Bob Ewing 10/15/2020, 11:32 AM

## 2020-10-15 NOTE — Progress Notes (Signed)
Chaplain engaged in an initial visit with [facility].  During visit, Bob Ewing shared about his current state of health and his philosophies about life.  Bob Ewing expressed that he understands that things will happen in life that we cannot control.  He understands that you have to take life as it comes.  Chaplain assesses that Bob Ewing has spent some time thinking about the meaning of life and where he is right now.  During the conversation, Bob Ewing did not that life does not feel "safe" right now with his diagnosis.  He  shared that even if the cancer is curable it might come back.  Chaplain asked Bob Ewing, "What if it doesn't come back?"  Bob Ewing had an appointment to see someone else who came in the room.  Chaplain will follow-up.  Chaplain also could assess Bob Ewing's need for clarity about his health and the cancers in his body.  Chaplain let nurse know.    10/15/20 1200  Clinical Encounter Type  Visited With Patient  Visit Type Initial

## 2020-10-15 NOTE — Consult Note (Addendum)
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  He was previously seen in the emergency department 10 days prior with fever was felt to have significant worsening in his metastatic disease with a pleural effusion on the left.  He was discharged home from the ED but became progressively more dyspneic over the following 10 days prompting his return to the emergency department at [facility] today on March 16.  Upon arrival to the ED his vitals were stable however a CTA chest was negative for PE but concerning for a large and enlarging left pleural effusion.  Patient was admitted to [facility].  Thoracentesis was attempted but poorly tolerated with limited fluid removed.  MRI brain was performed which showed no evidence of intracranial metastases, 2 significant calvarial mets, and a small area of restricted diffusion in the left frontal lobe concerning for subacute ischemic infarct.  Neurology is consulted for stroke work-up.  Patient reports no focal neurologic deficits such as weakness or numbness, no cognitive impairment, no dizziness, no headache.  He does have a red rash on his trunk, chest, back, and arms that is pruritic.  He is currently not dyspneic at rest. He is being followed by Dr. Alen Blew with oncology while inpatient.    ROS   10 point review of systems was performed and was negative except as described in HPI.  Past History   Past Medical History:  Diagnosis Date  . Cancer ([place])   . Hypertension    History  reviewed. No pertinent surgical history. History reviewed. No pertinent family history. Social History   Socioeconomic History  . Marital status: Legally Separated    Spouse name: Not on file  . Number of children: Not on file  . Years of education: Not on file  . Highest education level: Not on file  Occupational History  . Not on file  Tobacco Use  . Smoking status: Current Every Day Smoker  . Smokeless tobacco: Never Used  Vaping Use  . Vaping Use: Never used  Substance and Sexual Activity  . Alcohol use: Yes  . Drug use: Never  . Sexual activity: Not on file  Other Topics Concern  . Not on file  Social History Narrative  . Not on file   Social Determinants of Health   Financial Resource Strain: Not on file  Food Insecurity: Not on file  Transportation Needs: Not on file  Physical Activity: Not on file  Stress: Not on file  Social Connections: Not on file   Allergies  Allergen Reactions  . Phentermine     Kidney pain    Medications   Medications Prior to Admission  Medication Sig Dispense Refill Last Dose  . atenolol (TENORMIN) 50 MG tablet Take 50 mg by mouth daily.   10/13/2020 at Unknown time  . hydrochlorothiazide (HYDRODIURIL) 25 MG tablet Take 25 mg by mouth daily.   10/13/2020 at Unknown time  . HYDROcodone-acetaminophen (NORCO) 10-325 MG tablet Take 1 tablet by mouth  every 4 (four) hours as needed for moderate pain or severe pain.   10/13/2020 at Unknown time     Vitals   Vitals:   10/14/20 1601 10/14/20 1644 10/14/20 2000 10/15/20 0557  BP:  104/62 (!) 106/59 104/62  Pulse:  96 99 91  Resp:  16 16 16   Temp:  98.1 F (36.7 C) 98.2 F (36.8 C) 98 F (36.7 C)  TempSrc:  Oral Oral Oral  SpO2: 90% 99% 98% 96%  Weight:      Height:         Body mass index is 30.41 kg/m.  Physical Exam   Physical Exam Gen: A&O x4, NAD HEENT: Atraumatic, normocephalic;mucous membranes moist; oropharynx clear, tongue without atrophy or fasciculations. Neck:  Supple, trachea midline. Resp: CTAB, no w/r/r CV: RRR, no m/g/r; nml S1 and S2. 2+ symmetric peripheral pulses. Abd: soft/NT/ND; nabs x 4 quad Extrem: Nml bulk; no cyanosis, clubbing, or edema. Skin: fine red rash trunk, chest, arms, back with excoriations on bilateral arms  Neuro: *MS: A&O x4. Naming and repetition intact. Follows multi-step commands.  *CN:    I: Deferred   II,III: PERRLA, VFF by confrontation, optic discs sharp   III,IV,VI: EOMI w/o nystagmus, no ptosis   V: Sensation intact from V1 to V3 to LT   VII: Eyelid closure was full.  Smile symmetric.   VIII: Hearing intact to voice   IX,X: Voice normal, palate elevates symmetrically    XI: SCM/trap 5/5 bilat   XII: Tongue protrudes midline, no atrophy or fasciculations   *Motor:   Normal bulk.  No tremor, rigidity or bradykinesia. No pronator drift.    Strength: Dlt Bic Tri WrE WrF FgS Gr HF KnF KnE PlF DoF    Left 5 5 5 5 5 5 5 5 5 5 5 5     Right 5 5 5 5 5 5 5 5 5 5 5 5     *Sensory: Intact to light touch, pinprick, temperature vibration throughout. Symmetric. Propioception intact bilat.  No double-simultaneous extinction.  *Coordination:  Finger-to-nose, heel-to-shin, rapid alternating motions were intact. *Reflexes:  2+ and symmetric throughout without clonus; toes down-going bilat *Gait: normal base, normal stride, normal turn. Negative Romberg.  NIHSS = 0 on 10/15/19 @ 1845   Labs   CBC:  Recent Labs  Lab 10/14/20 0132 10/15/20 0618  WBC 7.2 10.9*  HGB 10.3* 10.9*  HCT 32.1* 33.9*  MCV 91.2 91.6  PLT 254 097    Basic Metabolic Panel:  Lab Results  Component Value Date   NA 133 (L) 10/15/2020   K 4.1 10/15/2020   CO2 23 10/15/2020   GLUCOSE 125 (H) 10/15/2020   BUN 24 (H) 10/15/2020   CREATININE 1.46 (H) 10/15/2020   CALCIUM 7.6 (L) 10/15/2020   GFRNONAA 59 (L) 10/15/2020   GFRAA >60 05/03/2020   Lipid Panel:  Lab Results  Component Value Date   LDLCALC 97 10/15/2020   HgbA1c:  Lab  Results  Component Value Date   HGBA1C 6.1 (H) 10/15/2020   Urine Drug Screen: No results found for: LABOPIA, COCAINSCRNUR, LABBENZ, AMPHETMU, THCU, LABBARB  Alcohol Level No results found for: ETH    Impression   50 yo gentleman w/ hx RCC metastatic to lungs and calvarium. Neurology consulted for incidental finding small subacute L frontal ischemic stroke on MRI brain. 2 significant calvarial mets also noted from known RCC. Neurologic exam is normal and NIHSS = 0 @ 1840 on 10/14/20.   Recommendations   #Stroke  w/u - MRA H&N - TTE w/ bubble - ASA 81mg  daily - Monitor on tele - LDL - A1c  Will continue to follow. _____________________________________________________________________  Thank you for the opportunity to take part in the care of this patient. If you have any further questions, please contact the neurology consultation attending.  Signed,  Su Monks, MD [facility] [phone]  If 7pm- 7am, please page neurology on call as listed in [place].

## 2020-10-18 ENCOUNTER — Other Ambulatory Visit: Payer: Self-pay | Admitting: Physician Assistant

## 2020-10-18 DIAGNOSIS — I639 Cerebral infarction, unspecified: Secondary | ICD-10-CM

## 2020-10-18 LAB — BODY FLUID CULTURE W GRAM STAIN: Culture: NO GROWTH

## 2020-10-20 LAB — CYTOLOGY - NON PAP

## 2020-10-22 ENCOUNTER — Inpatient Hospital Stay (HOSPITAL_BASED_OUTPATIENT_CLINIC_OR_DEPARTMENT_OTHER): Payer: 59 | Admitting: Medical

## 2020-10-22 ENCOUNTER — Inpatient Hospital Stay: Payer: 59

## 2020-10-22 ENCOUNTER — Other Ambulatory Visit: Payer: Self-pay | Admitting: Medical

## 2020-10-22 ENCOUNTER — Other Ambulatory Visit: Payer: Self-pay

## 2020-10-22 ENCOUNTER — Inpatient Hospital Stay (HOSPITAL_BASED_OUTPATIENT_CLINIC_OR_DEPARTMENT_OTHER): Payer: 59 | Admitting: Oncology

## 2020-10-22 VITALS — BP 108/64 | HR 82 | Temp 98.4°F | Resp 18

## 2020-10-22 VITALS — BP 121/72 | HR 91 | Temp 97.9°F | Resp 18 | Ht 68.0 in | Wt 203.1 lb

## 2020-10-22 DIAGNOSIS — C778 Secondary and unspecified malignant neoplasm of lymph nodes of multiple regions: Secondary | ICD-10-CM | POA: Diagnosis not present

## 2020-10-22 DIAGNOSIS — Z5112 Encounter for antineoplastic immunotherapy: Secondary | ICD-10-CM | POA: Diagnosis not present

## 2020-10-22 DIAGNOSIS — M545 Low back pain, unspecified: Secondary | ICD-10-CM

## 2020-10-22 DIAGNOSIS — C642 Malignant neoplasm of left kidney, except renal pelvis: Secondary | ICD-10-CM

## 2020-10-22 DIAGNOSIS — T8090XA Unspecified complication following infusion and therapeutic injection, initial encounter: Secondary | ICD-10-CM

## 2020-10-22 LAB — CBC WITH DIFFERENTIAL (CANCER CENTER ONLY)
Abs Immature Granulocytes: 0.13 10*3/uL — ABNORMAL HIGH (ref 0.00–0.07)
Basophils Absolute: 0.1 10*3/uL (ref 0.0–0.1)
Basophils Relative: 1 %
Eosinophils Absolute: 0.5 10*3/uL (ref 0.0–0.5)
Eosinophils Relative: 6 %
HCT: 33.4 % — ABNORMAL LOW (ref 39.0–52.0)
Hemoglobin: 10.6 g/dL — ABNORMAL LOW (ref 13.0–17.0)
Immature Granulocytes: 2 %
Lymphocytes Relative: 18 %
Lymphs Abs: 1.4 10*3/uL (ref 0.7–4.0)
MCH: 28.4 pg (ref 26.0–34.0)
MCHC: 31.7 g/dL (ref 30.0–36.0)
MCV: 89.5 fL (ref 80.0–100.0)
Monocytes Absolute: 0.7 10*3/uL (ref 0.1–1.0)
Monocytes Relative: 9 %
Neutro Abs: 5.2 10*3/uL (ref 1.7–7.7)
Neutrophils Relative %: 64 %
Platelet Count: 420 10*3/uL — ABNORMAL HIGH (ref 150–400)
RBC: 3.73 MIL/uL — ABNORMAL LOW (ref 4.22–5.81)
RDW: 14 % (ref 11.5–15.5)
WBC Count: 8 10*3/uL (ref 4.0–10.5)
nRBC: 0 % (ref 0.0–0.2)

## 2020-10-22 LAB — CMP (CANCER CENTER ONLY)
ALT: 80 U/L — ABNORMAL HIGH (ref 0–44)
AST: 24 U/L (ref 15–41)
Albumin: 3.2 g/dL — ABNORMAL LOW (ref 3.5–5.0)
Alkaline Phosphatase: 118 U/L (ref 38–126)
Anion gap: 11 (ref 5–15)
BUN: 22 mg/dL — ABNORMAL HIGH (ref 6–20)
CO2: 23 mmol/L (ref 22–32)
Calcium: 9.1 mg/dL (ref 8.9–10.3)
Chloride: 105 mmol/L (ref 98–111)
Creatinine: 1.26 mg/dL — ABNORMAL HIGH (ref 0.61–1.24)
GFR, Estimated: >60 mL/min (ref >60)
Glucose, Bld: 167 mg/dL — ABNORMAL HIGH (ref 70–99)
Potassium: 3.9 mmol/L (ref 3.5–5.1)
Sodium: 139 mmol/L (ref 135–145)
Total Bilirubin: 0.3 mg/dL (ref 0.3–1.2)
Total Protein: 7.7 g/dL (ref 6.5–8.1)

## 2020-10-22 LAB — TSH: TSH: 3.57 u[IU]/mL (ref 0.320–4.118)

## 2020-10-22 MED ORDER — SODIUM CHLORIDE 0.9 % IV SOLN
Freq: Once | INTRAVENOUS | Status: AC
  Filled 2020-10-22: qty 250

## 2020-10-22 MED ORDER — MORPHINE SULFATE (PF) 2 MG/ML IV SOLN
2.0000 mg | Freq: Once | INTRAVENOUS | Status: AC
  Administered 2020-10-22: 2 mg via INTRAVENOUS

## 2020-10-22 MED ORDER — HYDROCODONE-HOMATROPINE 5-1.5 MG/5ML PO SYRP: 5.0000 mL | ORAL_SOLUTION | Freq: Four times a day (QID) | ORAL | 0 refills | Status: DC | PRN

## 2020-10-22 MED ORDER — FAMOTIDINE IN NACL 20-0.9 MG/50ML-% IV SOLN
INTRAVENOUS | Status: AC
  Filled 2020-10-22: qty 50

## 2020-10-22 MED ORDER — SODIUM CHLORIDE 0.9 % IV SOLN
1.0000 mg/kg | Freq: Once | INTRAVENOUS | Status: AC
  Administered 2020-10-22: 100 mg via INTRAVENOUS
  Filled 2020-10-22: qty 20

## 2020-10-22 MED ORDER — SODIUM CHLORIDE 0.9 % IV SOLN
3.0900 mg/kg | Freq: Once | INTRAVENOUS | Status: AC
  Administered 2020-10-22: 280 mg via INTRAVENOUS
  Filled 2020-10-22: qty 24

## 2020-10-22 MED ORDER — TRIAMCINOLONE ACETONIDE 0.5 % EX OINT: 1.0000 "application " | TOPICAL_OINTMENT | Freq: Two times a day (BID) | CUTANEOUS | 0 refills | Status: DC

## 2020-10-22 MED ORDER — DIPHENHYDRAMINE HCL 50 MG/ML IJ SOLN
25.0000 mg | Freq: Once | INTRAMUSCULAR | Status: AC
  Administered 2020-10-22: 25 mg via INTRAVENOUS

## 2020-10-22 MED ORDER — FAMOTIDINE IN NACL 20-0.9 MG/50ML-% IV SOLN
20.0000 mg | Freq: Once | INTRAVENOUS | Status: AC
  Administered 2020-10-22: 20 mg via INTRAVENOUS

## 2020-10-22 MED ORDER — DIPHENHYDRAMINE HCL 50 MG/ML IJ SOLN
INTRAMUSCULAR | Status: AC
  Filled 2020-10-22: qty 1

## 2020-10-22 MED ORDER — MORPHINE SULFATE (PF) 2 MG/ML IV SOLN
INTRAVENOUS | Status: AC
  Filled 2020-10-22: qty 1

## 2020-10-22 NOTE — Progress Notes (Signed)
Hematology and Oncology Follow Up Visit  Bob Ewing [id number] Jul 21, 1971 50 y.o. 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  He has had to have intermediate risk features with pulmonary metastasis.   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  Current therapy: Ipilimumab 1 mg/kg and nivolumab 3 mg/kg cycle 1 given on October 02, 2020.  He is here for cycle 2 of therapy.  Interim History: [name] returns today for a follow-up visit.  He received the first cycle of treatment and was hospitalized for shortness of breath and found to have pleural effusion that was drained successfully.  Since his discharge, he reports improvement in his respiratory status although he still has some cough and dyspnea on exertion.  He still able to drive and attends to activities of daily living and appears to be comfortable at rest.  He denies any headache or neurological deficits.  Subcutaneous nodules on his scalp is improved.     Medications: I have reviewed the patient's current medications.  Current Outpatient Medications  Medication Sig Dispense Refill  . aspirin 81 MG chewable tablet Chew 1 tablet (81 mg total) by mouth daily. 90 tablet 0  . atenolol (TENORMIN) 50 MG tablet Take 50 mg by mouth daily.    . diphenhydrAMINE (BENADRYL) 25 mg capsule Take 1 capsule (25 mg total) by mouth every 6 (six) hours as needed for up to 5 days for itching (rash). 20 capsule 0  . HYDROcodone-acetaminophen (NORCO) 10-325 MG tablet Take 1 tablet by mouth every 4 (four) hours as needed for moderate pain or severe pain.     No current facility-administered medications for this visit.     Allergies:  Allergies  Allergen Reactions  . Phentermine     Kidney  pain      Physical Exam: Blood pressure 121/72, pulse 91, temperature 97.9 F (36.6 C), temperature source Tympanic, resp. rate 18, height 5\' 8"  (1.727 m), weight 203 lb 1.6 oz (92.1 kg), SpO2 99 %. ECOG: 1   General appearance: Comfortable appearing without any discomfort Head: Normocephalic without any trauma Oropharynx: Mucous membranes are moist and pink without any thrush or ulcers. Eyes: Pupils are equal and round reactive to light. Lymph nodes: No cervical, supraclavicular, inguinal or axillary lymphadenopathy.   Heart:regular rate and rhythm.  S1 and S2 without leg edema. Lung:  Scattered rhonchi bilaterally with decreased breath sounds the lung bases. Abdomin: Soft, nontender, nondistended with good bowel sounds.  No hepatosplenomegaly. Musculoskeletal: No joint deformity or effusion.  Full range of motion noted. Neurological: No deficits noted on motor, sensory and deep tendon reflex exam. Skin: Significant reduction in his scalp lesions.    Lab Results: Lab Results  Component Value Date   WBC 8.0 10/22/2020   HGB 10.6 (L) 10/22/2020   HCT 33.4 (L) 10/22/2020   MCV 89.5 10/22/2020   PLT 420 (H) 10/22/2020     Chemistry      Component Value Date/Time   NA 133 (L) 10/15/2020 0618   K 4.1 10/15/2020 0618   CL 101 10/15/2020 0618   CO2 23 10/15/2020 0618   BUN 24 (H) 10/15/2020 0618   CREATININE 1.46 (H) 10/15/2020 0618   CREATININE 1.24 10/02/2020 1135      Component Value Date/Time  CALCIUM 7.6 (L) 10/15/2020 0618   ALKPHOS 86 10/14/2020 0132   AST 28 10/14/2020 0132   AST 14 (L) 10/02/2020 1135   ALT 50 (H) 10/14/2020 0132   ALT 38 10/02/2020 1135   BILITOT 0.8 10/14/2020 0132   BILITOT 0.3 10/02/2020 1135       Radiological Studies: IMPRESSION: Metastatic disease to the calvarium. Two lesions are present in the parietal bone consistent metastatic disease. Both show early intracranial but likely extradural extension.  No metastatic deposits  in the brain  Small areas of restricted diffusion in the left frontal white matter consistent with acute or subacute infarction was  Impression and Plan:  50 year old man with:  1.  Stage IV renal cell carcinoma arising from the left kidney presented with abdominal adenopathy and pulmonary metastasis in February 2022.  He has clear-cell histology with rhabdoid features indicating intermediate risk disease.  He has completed the first cycle of ipilimumab and nivolumab without any major complications and ready to proceed with cycle 2.  He already noticed a decrease in his scalp metastasis which is a positive sign indicating possible response to therapy.  Risks and benefits of immunotherapy were discussed at this time.  Potential complications bleeding hypothyroidism, pneumonitis, colitis and thyroid disease among others were reviewed.  Alternative treatment options would include oral targeted therapy which we will be deferred as the second line option.  Given the positive response to therapy I recommended proceeding with cycle 2 without any dose reduction or delay.  2.  IV access: No need for Port-A-Cath at this time.  3.  Antiemetics: No nausea or vomiting reported at this time.  Compazine is available to him.  4.  CNS surveillance: MRI on October 14, 2020 reviewed and showed no evidence of intracranial metastasis.  5.  Pulmonary considerations: He does have pleural effusion and underwent thoracentesis.  This can be repeated in the future as needed.  Anticipate improvement in his symptoms of his cancer response to therapy.  Prescription for Hycodan will be available to him.  6.  Goals of care and prognosis: Therapy remains palliative although aggressive measures are warranted given his excellent performance status.  7.  Immune mediated complications: These were reiterated including dermatitis among others.  Prescription for steroid  cream will be available to him  8.  Follow-up: In 3  weeks for the next cycle of therapy.  40  minutes were spent on this encounter.  The time was dedicated to reviewing disease status, reviewing imaging studies and addressing complications related to his cancer and cancer therapy.    Zola Button, MD 3/24/20228:56 AM

## 2020-10-22 NOTE — Progress Notes (Signed)
1000 - Yervoy checklist completed. Pt discussed the the side effects he was having when he saw Dr. Alen Blew today Per Dr. Hazeline Junker note OK to proceed with treatment today.   1040 - Pt reports back pain during Nivolumab infusion. Infusion paused. [employer] PA  called to bedside. VSS. Morphine and Pepcid given per MAR. Nivolumab infusion restarted and pt was able to tolerate the remaining infusion without complications.

## 2020-10-22 NOTE — Progress Notes (Signed)
    DATE:  10/22/2020                                          X  CHEMO/IMMUNOTHERAPY REACTION            MD:  Dr. Alen Blew   AGENT/BLOOD PRODUCT RECEIVING TODAY:               Nivolumab and Yervoy   AGENT/BLOOD PRODUCT RECEIVING IMMEDIATELY PRIOR TO REACTION:           Nivolumab   VS: BP:      106/73   P:        91       SPO2:        100% on room air                  REACTION(S):           Acute lower back pain   PREMEDS:       Benadryl and Pepcid   INTERVENTION: Nivolumab was paused and the patient was given morphine sulfate 2 mg IV and a second dose of Pepcid 20 mg IV x1   Review of Systems  Review of Systems  Constitutional: Negative for chills, diaphoresis and fever.  HENT: Negative for trouble swallowing and voice change.   Respiratory: Negative for cough, chest tightness, shortness of breath and wheezing.   Cardiovascular: Negative for chest pain and palpitations.  Gastrointestinal: Negative for abdominal pain, constipation, diarrhea, nausea and vomiting.  Musculoskeletal: Positive for back pain. Negative for myalgias.  Neurological: Negative for dizziness, light-headedness and headaches.     Physical Exam  Physical Exam Constitutional:      General: He is not in acute distress.    Appearance: He is not diaphoretic.  HENT:     Head: Normocephalic and atraumatic.  Cardiovascular:     Rate and Rhythm: Normal rate and regular rhythm.     Heart sounds: Normal heart sounds. No murmur heard. No friction rub. No gallop.   Pulmonary:     Effort: Pulmonary effort is normal. No respiratory distress.     Breath sounds: Normal breath sounds. No wheezing or rales.  Skin:    General: Skin is warm and dry.     Findings: No erythema or rash.  Neurological:     Mental Status: He is alert.     OUTCOME:                 The patient's symptoms abated and he was able to restart and complete nivolumab then proceed with dosing with [facility] without any additional issues of concern.    Sandi Mealy, MHS, PA-C

## 2020-10-22 NOTE — Patient Instructions (Addendum)
[facility] Discharge Instructions for Patients Receiving Chemotherapy  Today you received the following chemotherapy agents: Nivolumab (Opdivo) and Ipilimumab (Yervoy).  To help prevent nausea and vomiting after your treatment, we encourage you to take your nausea medication as prescribed.    If you develop nausea and vomiting that is not controlled by your nausea medication, call the clinic.   BELOW ARE SYMPTOMS THAT SHOULD BE REPORTED IMMEDIATELY:  *FEVER GREATER THAN 100.5 F  *CHILLS WITH OR WITHOUT FEVER  NAUSEA AND VOMITING THAT IS NOT CONTROLLED WITH YOUR NAUSEA MEDICATION  *UNUSUAL SHORTNESS OF BREATH  *UNUSUAL BRUISING OR BLEEDING  TENDERNESS IN MOUTH AND THROAT WITH OR WITHOUT PRESENCE OF ULCERS  *URINARY PROBLEMS  *BOWEL PROBLEMS  UNUSUAL RASH Items with * indicate a potential emergency and should be followed up as soon as possible.  Feel free to call the clinic should you have any questions or concerns. The clinic phone number is [phone].  Please show the CHEMO ALERT CARD at check-in to the Emergency Department and triage nurse.   

## 2020-10-24 ENCOUNTER — Other Ambulatory Visit: Payer: Self-pay | Admitting: Oncology

## 2020-10-26 ENCOUNTER — Other Ambulatory Visit: Payer: Self-pay | Admitting: Oncology

## 2020-10-28 ENCOUNTER — Ambulatory Visit (INDEPENDENT_AMBULATORY_CARE_PROVIDER_SITE_OTHER): Payer: 59 | Admitting: Neurology

## 2020-10-28 ENCOUNTER — Encounter: Payer: Self-pay | Admitting: Neurology

## 2020-10-28 VITALS — BP 121/73 | HR 79 | Ht 68.0 in | Wt 207.0 lb

## 2020-10-28 DIAGNOSIS — I639 Cerebral infarction, unspecified: Secondary | ICD-10-CM

## 2020-10-28 NOTE — Progress Notes (Signed)
Reason for visit: Stroke  Referring physician: [facility]  Bob Ewing is a 50 y.o. male  History of present illness:  Bob Ewing is a 50 year old right-handed gentleman with a history of stage IV renal cell carcinoma that was diagnosed in February 2022.  The patient has pulmonary metastasis and has developed a pleural effusion.  The patient entered the hospital on 14 October 2020 with shortness of breath, he required thoracentesis to improve his breathing.  The patient was seen through oncology while he was in the hospital and they did MRI of the brain to help stage the cancer.  As an ancillary finding, the patient was noted to have small punctate strokes in the left frontal white matter.  For this reason, he underwent a stroke evaluation that included MRA of the head and neck, and 2D echocardiogram which were unremarkable.  No evidence of endocarditis was noted.  The patient was noted to have a hemoglobin A1c of 6.1, he does have a history of hypertension.  He had an LDL of 97.  The patient is getting immunotherapy for his cancer, he has gotten two treatments so far with nivolumab.  He is sent to this office for further evaluation.  He has been set up for a cardiac monitor study, but this has not yet been done.  The patient reports no focal symptoms of numbness, weakness, speech changes, or visual changes.  He does have early morning headaches frequently.  He reports some slight numbness of the right fifth finger.  He denies any balance issues.  Past Medical History:  Diagnosis Date  . Cancer ([place])   . Hypertension   . Stroke [facility])     No past surgical history on file.  No family history on file.  Social history:  reports that he has been smoking. He has been smoking about 0.25 packs per day. He has never used smokeless tobacco. He reports current alcohol use. He reports that he does not use drugs.  Medications:  Prior to Admission medications   Medication Sig Start Date End  Date Taking? Authorizing Provider  aspirin 81 MG chewable tablet Chew 1 tablet (81 mg total) by mouth daily. 10/16/20  Yes Mariel Aloe, MD  atenolol (TENORMIN) 50 MG tablet Take 50 mg by mouth daily.   Yes [provider]  triamcinolone ointment (KENALOG) 0.5 % APPLY TOPICALLY TO THE AFFECTED AREA TWICE DAILY 10/27/20  Yes [name], MD  diphenhydrAMINE (BENADRYL) 25 mg capsule Take 1 capsule (25 mg total) by mouth every 6 (six) hours as needed for up to 5 days for itching (rash). 10/15/20 10/20/20  Mariel Aloe, MD  HYDROcodone-acetaminophen (NORCO) 10-325 MG tablet Take 1 tablet by mouth every 4 (four) hours as needed for moderate pain or severe pain. Patient not taking: Reported on 10/28/2020    [provider]  HYDROcodone-homatropine (HYCODAN) 5-1.5 MG/5ML syrup Take 5 mLs by mouth every 6 (six) hours as needed for cough. Patient not taking: Reported on 10/28/2020 10/22/20   [name], MD      Allergies  Allergen Reactions  . Phentermine     Kidney pain    ROS:  Out of a complete 14 system review of symptoms, the patient complains only of the following symptoms, and all other reviewed systems are negative.  Shortness of breath, dyspnea on exertion Weight loss  Blood pressure 121/73, pulse 79, height 5\' 8"  (1.727 m), weight 207 lb (93.9 kg).  Physical Exam  General: The patient is alert and cooperative at the time of the examination.  Eyes: Pupils are equal, round, and reactive to light. Discs are flat bilaterally.  Neck: The neck is supple, no carotid bruits are noted.  Respiratory: The respiratory examination is clear.  Cardiovascular: The cardiovascular examination reveals a regular rate and rhythm, no obvious murmurs or rubs are noted.  Skin: Extremities are without significant edema.  Neurologic Exam  Mental status: The patient is alert and oriented x 3 at the time of the examination. The patient has apparent normal recent and remote  memory, with an apparently normal attention span and concentration ability.  Cranial nerves: Facial symmetry is present. There is good sensation of the face to pinprick and soft touch bilaterally. The strength of the facial muscles and the muscles to head turning and shoulder shrug are normal bilaterally. Speech is well enunciated, no aphasia or dysarthria is noted. Extraocular movements are full. Visual fields are full. The tongue is midline, and the patient has symmetric elevation of the soft palate. No obvious hearing deficits are noted.  Motor: The motor testing reveals 5 over 5 strength of all 4 extremities. Good symmetric motor tone is noted throughout.  Sensory: Sensory testing is intact to pinprick, soft touch, vibration sensation, and position sense on all 4 extremities. No evidence of extinction is noted.  Coordination: Cerebellar testing reveals good finger-nose-finger and heel-to-shin bilaterally.  Gait and station: Gait is normal. Tandem gait is normal. Romberg is negative. No drift is seen.  Reflexes: Deep tendon reflexes are symmetric and normal bilaterally. Toes are downgoing bilaterally.   MRI brain 10/14/20:  IMPRESSION: Metastatic disease to the calvarium. Two lesions are present in the parietal bone consistent metastatic disease. Both show early intracranial but likely extradural extension.  No metastatic deposits in the brain  Small areas of restricted diffusion in the left frontal white matter consistent with acute or subacute infarction.  * MRI scan images were reviewed online. I agree with the written report.   2D echo 10/15/20:  IMPRESSIONS    1. Left ventricular ejection fraction, by estimation, is 60 to 65%. The  left ventricle has normal function. The left ventricle has no regional  wall motion abnormalities. Left ventricular diastolic parameters were  normal.  2. Right ventricular systolic function is normal. The right ventricular  size is  normal.  3. The mitral valve is normal in structure. Trivial mitral valve  regurgitation. No evidence of mitral stenosis.  4. The aortic valve is normal in structure. Aortic valve regurgitation is  not visualized. No aortic stenosis is present.  5. Agitated saline contrast bubble study was negative, with no evidence  of any interatrial shunt.    MRA neck 10/15/20:  IMPRESSION: Negative neck MRA.   MRA head 10/15/20:  IMPRESSION: Negative intracranial MRA.   Assessment/Plan:  1.  Renal cell carcinoma, stage IV  2.  Left frontal stroke events  3.  History of hypertension  The patient does have a history of hypertension, but he may have a hypercoagulable state associated with his cancer.  The patient will undergo a cardiac monitor study.  He will continue aspirin therapy for now.  Neurologically, he has not sustained any significant deficit from the stroke.  He will contact our office if he has not heard anything about the appointment for the cardiac monitor study within the next week and we will reorder.  Jill Alexanders MD 10/28/2020 8:46 AM  [facility] [place]  [place], [place] [address]  Phone [phone] Fax [phone]

## 2020-11-03 ENCOUNTER — Telehealth: Payer: Self-pay | Admitting: Neurology

## 2020-11-03 NOTE — Telephone Encounter (Signed)
Pt called, two weeks ago was told cardiac monitor would be here in 5 days. Have not received monitor as of today. Would like a call from the nurse.

## 2020-11-04 NOTE — Telephone Encounter (Signed)
Bob Ewing- can you help with this? Unsure if this would be the same contact we spoke about yesterday at [facility]/[address]

## 2020-11-05 ENCOUNTER — Encounter: Payer: Self-pay | Admitting: Oncology

## 2020-11-05 NOTE — Progress Notes (Signed)
Pt is approved w/ [employer] to receive copay assistance for Bob Ewing and Bob Ewing from4/6/22to 07/31/21. Pt is responsible for the first $25 of their copay per product.  The program will cover the remainder of the copay, up to a maximum of $25,000 per BMS medication during the calendar year.

## 2020-11-09 NOTE — Telephone Encounter (Signed)
Patient is scheduled with Dr. Jenkins Rouge  [facility] [address] .

## 2020-11-12 ENCOUNTER — Encounter: Payer: Self-pay | Admitting: Oncology

## 2020-11-12 ENCOUNTER — Inpatient Hospital Stay (HOSPITAL_BASED_OUTPATIENT_CLINIC_OR_DEPARTMENT_OTHER): Payer: 59 | Admitting: Oncology

## 2020-11-12 ENCOUNTER — Inpatient Hospital Stay: Payer: 59

## 2020-11-12 ENCOUNTER — Other Ambulatory Visit: Payer: Self-pay

## 2020-11-12 ENCOUNTER — Inpatient Hospital Stay: Payer: 59 | Attending: Medical

## 2020-11-12 VITALS — BP 140/89 | HR 76 | Temp 95.0°F | Resp 17 | Ht 68.0 in | Wt 205.5 lb

## 2020-11-12 VITALS — Temp 98.2°F

## 2020-11-12 DIAGNOSIS — Z5112 Encounter for antineoplastic immunotherapy: Secondary | ICD-10-CM | POA: Insufficient documentation

## 2020-11-12 DIAGNOSIS — C7800 Secondary malignant neoplasm of unspecified lung: Secondary | ICD-10-CM | POA: Insufficient documentation

## 2020-11-12 DIAGNOSIS — C642 Malignant neoplasm of left kidney, except renal pelvis: Secondary | ICD-10-CM | POA: Diagnosis present

## 2020-11-12 DIAGNOSIS — C778 Secondary and unspecified malignant neoplasm of lymph nodes of multiple regions: Secondary | ICD-10-CM

## 2020-11-12 LAB — CMP (CANCER CENTER ONLY)
ALT: 51 U/L — ABNORMAL HIGH (ref 0–44)
AST: 17 U/L (ref 15–41)
Albumin: 3.8 g/dL (ref 3.5–5.0)
Alkaline Phosphatase: 134 U/L — ABNORMAL HIGH (ref 38–126)
Anion gap: 13 (ref 5–15)
BUN: 24 mg/dL — ABNORMAL HIGH (ref 6–20)
CO2: 22 mmol/L (ref 22–32)
Calcium: 9.3 mg/dL (ref 8.9–10.3)
Chloride: 105 mmol/L (ref 98–111)
Creatinine: 1.23 mg/dL (ref 0.61–1.24)
GFR, Estimated: >60 mL/min (ref >60)
Glucose, Bld: 157 mg/dL — ABNORMAL HIGH (ref 70–99)
Potassium: 4.1 mmol/L (ref 3.5–5.1)
Sodium: 140 mmol/L (ref 135–145)
Total Bilirubin: 0.2 mg/dL — ABNORMAL LOW (ref 0.3–1.2)
Total Protein: 8.3 g/dL — ABNORMAL HIGH (ref 6.5–8.1)

## 2020-11-12 LAB — CBC WITH DIFFERENTIAL (CANCER CENTER ONLY)
Abs Immature Granulocytes: 0.07 10*3/uL (ref 0.00–0.07)
Basophils Absolute: 0.1 10*3/uL (ref 0.0–0.1)
Basophils Relative: 1 %
Eosinophils Absolute: 0.5 10*3/uL (ref 0.0–0.5)
Eosinophils Relative: 5 %
HCT: 40.2 % (ref 39.0–52.0)
Hemoglobin: 13.1 g/dL (ref 13.0–17.0)
Immature Granulocytes: 1 %
Lymphocytes Relative: 18 %
Lymphs Abs: 1.8 10*3/uL (ref 0.7–4.0)
MCH: 28.7 pg (ref 26.0–34.0)
MCHC: 32.6 g/dL (ref 30.0–36.0)
MCV: 88 fL (ref 80.0–100.0)
Monocytes Absolute: 0.6 10*3/uL (ref 0.1–1.0)
Monocytes Relative: 6 %
Neutro Abs: 6.8 10*3/uL (ref 1.7–7.7)
Neutrophils Relative %: 69 %
Platelet Count: 378 10*3/uL (ref 150–400)
RBC: 4.57 MIL/uL (ref 4.22–5.81)
RDW: 14.6 % (ref 11.5–15.5)
WBC Count: 9.8 10*3/uL (ref 4.0–10.5)
nRBC: 0 % (ref 0.0–0.2)

## 2020-11-12 LAB — TSH: TSH: 2.49 u[IU]/mL (ref 0.320–4.118)

## 2020-11-12 MED ORDER — OXYCODONE HCL 5 MG PO TABS
ORAL_TABLET | ORAL | Status: AC
  Filled 2020-11-12: qty 1

## 2020-11-12 MED ORDER — DIPHENHYDRAMINE HCL 50 MG/ML IJ SOLN
25.0000 mg | Freq: Once | INTRAMUSCULAR | Status: AC
  Administered 2020-11-12: 25 mg via INTRAVENOUS

## 2020-11-12 MED ORDER — SODIUM CHLORIDE 0.9 % IV SOLN
Freq: Once | INTRAVENOUS | Status: AC
  Filled 2020-11-12: qty 250

## 2020-11-12 MED ORDER — OXYCODONE HCL 5 MG PO TABS
5.0000 mg | ORAL_TABLET | Freq: Once | ORAL | Status: AC
  Administered 2020-11-12: 5 mg via ORAL

## 2020-11-12 MED ORDER — SODIUM CHLORIDE 0.9 % IV SOLN
1.0000 mg/kg | Freq: Once | INTRAVENOUS | Status: AC
  Administered 2020-11-12: 100 mg via INTRAVENOUS
  Filled 2020-11-12: qty 20

## 2020-11-12 MED ORDER — SODIUM CHLORIDE 0.9 % IV SOLN
3.0900 mg/kg | Freq: Once | INTRAVENOUS | Status: AC
  Administered 2020-11-12: 280 mg via INTRAVENOUS
  Filled 2020-11-12: qty 24

## 2020-11-12 MED ORDER — DIPHENHYDRAMINE HCL 50 MG/ML IJ SOLN
INTRAMUSCULAR | Status: AC
  Filled 2020-11-12: qty 1

## 2020-11-12 MED ORDER — MORPHINE SULFATE (PF) 2 MG/ML IV SOLN
2.0000 mg | Freq: Once | INTRAVENOUS | Status: AC
  Administered 2020-11-12: 2 mg via INTRAVENOUS

## 2020-11-12 MED ORDER — FAMOTIDINE IN NACL 20-0.9 MG/50ML-% IV SOLN
20.0000 mg | Freq: Once | INTRAVENOUS | Status: AC
  Administered 2020-11-12: 20 mg via INTRAVENOUS

## 2020-11-12 MED ORDER — MORPHINE SULFATE (PF) 2 MG/ML IV SOLN
INTRAVENOUS | Status: AC
  Filled 2020-11-12: qty 1

## 2020-11-12 MED ORDER — FAMOTIDINE IN NACL 20-0.9 MG/50ML-% IV SOLN
INTRAVENOUS | Status: AC
  Filled 2020-11-12: qty 50

## 2020-11-12 NOTE — Progress Notes (Signed)
Pt is approved for the $1000 [employer] grant.  

## 2020-11-12 NOTE — Progress Notes (Signed)

## 2020-11-12 NOTE — Progress Notes (Signed)
Hematology and Oncology Follow Up Visit  Bob Ewing [id number] 22-Jun-1971 50 y.o. 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  Current therapy: Ipilimumab 1 mg/kg and nivolumab 3 mg/kg cycle 1 given on October 02, 2020.  He is here for cycle 3 of therapy.  Interim History: Bob Ewing presents today for return evaluation.  Since last visit, he reports no major changes in his health.  He continues to notice improvement in his overall clinical status.  His scalp nodules have this.  At this time.  He no longer reporting any cough or dyspnea on exertion.  His breathing continues to improve.  He does smoke periodically which is affected his breathing but otherwise he continues to improve exercise tolerance.     Medications: Updated on review. Current Outpatient Medications  Medication Sig Dispense Refill  . aspirin 81 MG chewable tablet Chew 1 tablet (81 mg total) by mouth daily. 90 tablet 0  . atenolol (TENORMIN) 50 MG tablet Take 50 mg by mouth daily.    . diphenhydrAMINE (BENADRYL) 25 mg capsule Take 1 capsule (25 mg total) by mouth every 6 (six) hours as needed for up to 5 days for itching (rash). 20 capsule 0  . HYDROcodone-acetaminophen (NORCO) 10-325 MG tablet Take 1 tablet by mouth every 4 (four) hours as needed for moderate pain or severe pain. (Patient not taking: Reported on 10/28/2020)    . HYDROcodone-homatropine (HYCODAN) 5-1.5 MG/5ML syrup Take 5 mLs by mouth every 6 (six) hours as needed for cough. (Patient not taking: Reported on 10/28/2020) 120 mL 0  . triamcinolone ointment (KENALOG) 0.5 % APPLY TOPICALLY TO THE  AFFECTED AREA TWICE DAILY 30 g 0   No current facility-administered medications for this visit.     Allergies:  Allergies  Allergen Reactions  . Phentermine     Kidney pain      Physical Exam: Blood pressure 140/89, pulse 76, temperature (!) 95 F (35 C), temperature source Tympanic, resp. rate 17, height 5\' 8"  (1.727 m), weight 205 lb 8 oz (93.2 kg), SpO2 98 %.   ECOG: 1   General appearance: Alert, awake without any distress. Head: Atraumatic without abnormalities Oropharynx: Without any thrush or ulcers. Eyes: No scleral icterus. Lymph nodes: No lymphadenopathy noted in the cervical, supraclavicular, or axillary nodes Heart:regular rate and rhythm, without any murmurs or gallops.   Lung: Clear to auscultation without any rhonchi, wheezes or dullness to percussion. Abdomin: Soft, nontender without any shifting dullness or ascites. Musculoskeletal: No clubbing or cyanosis. Neurological: No motor or sensory deficits. Skin: No rashes or lesions.     Lab Results: Lab Results  Component Value Date   WBC 8.0 10/22/2020   HGB 10.6 (L) 10/22/2020   HCT 33.4 (L) 10/22/2020   MCV 89.5 10/22/2020   PLT 420 (H) 10/22/2020     Chemistry      Component Value Date/Time   NA 139 10/22/2020 0826   K 3.9 10/22/2020 0826   CL 105 10/22/2020 0826   CO2 23 10/22/2020 0826   BUN 22 (H) 10/22/2020 0826   CREATININE 1.26 (H) 10/22/2020 0826      Component Value Date/Time  CALCIUM 9.1 10/22/2020 0826   ALKPHOS 118 10/22/2020 0826   AST 24 10/22/2020 0826   ALT 80 (H) 10/22/2020 0826   BILITOT 0.3 10/22/2020 0826         Impression and Plan:  50 year old man with:  1.  Kidney cancer presented in February 2022.  He was found to have stage IV clear-cell with rhabdoid features and metastasis involving the lung, abdominal adenopathy and subcutaneous nodules.    His disease status was updated and risks and benefits of the current treatment were reviewed.  He has  tolerated therapy without any major complaints.  Complications including immune mediated issues, infusion related toxicity and GI toxicity were discussed.  Alternative options including oral targeted therapy were reviewed.   He continues to benefit clinically and clear subcutaneous nodules of no longer being appreciated on his scalp.  This indicates positive response to therapy which will be documented after cycle 4.   The plan is to complete 4 cycles of therapy and update his staging scans subsequently.  He is agreeable to proceed.  2.  IV access: Peripheral veins are currently in use of Port-A-Cath is deferred at this time.  3.  Antiemetics: Compazine is available to him without any nausea or vomiting.  4.  CNS surveillance: No evidence of CNS metastasis noted on MRI in March 2022.  5.  Pulmonary considerations: Related to his metastatic disease to the lung.  6.  Goals of care and prognosis: His disease is incurable although aggressive measures are warranted given his young age and performance status.  7.  Immune mediated complications: I continue to educate him about potential complication clinic pneumonitis, colitis or thyroid disease.  8.  Follow-up: He will return in 3 weeks for the next cycle of therapy.  30  minutes were dedicated to this visit.  The time was spent on reviewing laboratory data, disease status update and discussing complications related to his cancer and cancer therapy.    Zola Button, MD 4/14/20228:26 AM

## 2020-11-12 NOTE — Patient Instructions (Signed)
Cancer Center Discharge Instructions for Patients Receiving Chemotherapy  Today you received the following chemotherapy agents: Nivolumab (Opdivo) and Ipilimumab (Yervoy).  To help prevent nausea and vomiting after your treatment, we encourage you to take your nausea medication as prescribed.    If you develop nausea and vomiting that is not controlled by your nausea medication, call the clinic.   BELOW ARE SYMPTOMS THAT SHOULD BE REPORTED IMMEDIATELY:  *FEVER GREATER THAN 100.5 F  *CHILLS WITH OR WITHOUT FEVER  NAUSEA AND VOMITING THAT IS NOT CONTROLLED WITH YOUR NAUSEA MEDICATION  *UNUSUAL SHORTNESS OF BREATH  *UNUSUAL BRUISING OR BLEEDING  TENDERNESS IN MOUTH AND THROAT WITH OR WITHOUT PRESENCE OF ULCERS  *URINARY PROBLEMS  *BOWEL PROBLEMS  UNUSUAL RASH Items with * indicate a potential emergency and should be followed up as soon as possible.  Feel free to call the clinic should you have any questions or concerns. The clinic phone number is [phone].  Please show the CHEMO ALERT CARD at check-in to the Emergency Department and triage nurse.   

## 2020-11-14 ENCOUNTER — Ambulatory Visit (INDEPENDENT_AMBULATORY_CARE_PROVIDER_SITE_OTHER): Payer: 59

## 2020-11-14 DIAGNOSIS — I639 Cerebral infarction, unspecified: Secondary | ICD-10-CM

## 2020-11-26 ENCOUNTER — Ambulatory Visit (INDEPENDENT_AMBULATORY_CARE_PROVIDER_SITE_OTHER): Payer: 59 | Admitting: Pulmonary Disease

## 2020-11-26 ENCOUNTER — Encounter: Payer: Self-pay | Admitting: Pulmonary Disease

## 2020-11-26 ENCOUNTER — Other Ambulatory Visit: Payer: Self-pay

## 2020-11-26 VITALS — BP 130/88 | HR 95 | Temp 97.3°F | Ht 68.0 in | Wt 203.2 lb

## 2020-11-26 DIAGNOSIS — J90 Pleural effusion, not elsewhere classified: Secondary | ICD-10-CM | POA: Diagnosis not present

## 2020-11-26 DIAGNOSIS — C642 Malignant neoplasm of left kidney, except renal pelvis: Secondary | ICD-10-CM

## 2020-11-26 NOTE — Patient Instructions (Signed)
Nice to see you   I am glad the breathing is better  Your lungs sound clear - no sign of fluid being back  Return as needed if breathing worsens - please call me and we can see if the fluid is back

## 2020-12-03 ENCOUNTER — Inpatient Hospital Stay (HOSPITAL_BASED_OUTPATIENT_CLINIC_OR_DEPARTMENT_OTHER): Payer: 59 | Admitting: Oncology

## 2020-12-03 ENCOUNTER — Other Ambulatory Visit: Payer: Self-pay

## 2020-12-03 ENCOUNTER — Inpatient Hospital Stay: Payer: 59

## 2020-12-03 ENCOUNTER — Inpatient Hospital Stay: Payer: 59 | Attending: Medical

## 2020-12-03 VITALS — BP 135/80 | HR 75 | Temp 97.9°F | Resp 18 | Ht 68.0 in | Wt 207.2 lb

## 2020-12-03 DIAGNOSIS — C642 Malignant neoplasm of left kidney, except renal pelvis: Secondary | ICD-10-CM

## 2020-12-03 DIAGNOSIS — N400 Enlarged prostate without lower urinary tract symptoms: Secondary | ICD-10-CM | POA: Insufficient documentation

## 2020-12-03 DIAGNOSIS — Z5112 Encounter for antineoplastic immunotherapy: Secondary | ICD-10-CM | POA: Insufficient documentation

## 2020-12-03 DIAGNOSIS — C7951 Secondary malignant neoplasm of bone: Secondary | ICD-10-CM | POA: Insufficient documentation

## 2020-12-03 DIAGNOSIS — C7800 Secondary malignant neoplasm of unspecified lung: Secondary | ICD-10-CM | POA: Diagnosis not present

## 2020-12-03 DIAGNOSIS — Z7984 Long term (current) use of oral hypoglycemic drugs: Secondary | ICD-10-CM | POA: Diagnosis not present

## 2020-12-03 DIAGNOSIS — E079 Disorder of thyroid, unspecified: Secondary | ICD-10-CM | POA: Diagnosis not present

## 2020-12-03 DIAGNOSIS — Z7982 Long term (current) use of aspirin: Secondary | ICD-10-CM | POA: Diagnosis not present

## 2020-12-03 DIAGNOSIS — Z79899 Other long term (current) drug therapy: Secondary | ICD-10-CM | POA: Insufficient documentation

## 2020-12-03 LAB — CMP (CANCER CENTER ONLY)
ALT: 43 U/L (ref 0–44)
AST: 20 U/L (ref 15–41)
Albumin: 4 g/dL (ref 3.5–5.0)
Alkaline Phosphatase: 113 U/L (ref 38–126)
Anion gap: 10 (ref 5–15)
BUN: 19 mg/dL (ref 6–20)
CO2: 25 mmol/L (ref 22–32)
Calcium: 9.5 mg/dL (ref 8.9–10.3)
Chloride: 102 mmol/L (ref 98–111)
Creatinine: 1.28 mg/dL — ABNORMAL HIGH (ref 0.61–1.24)
GFR, Estimated: >60 mL/min (ref >60)
Glucose, Bld: 214 mg/dL — ABNORMAL HIGH (ref 70–99)
Potassium: 3.9 mmol/L (ref 3.5–5.1)
Sodium: 137 mmol/L (ref 135–145)
Total Bilirubin: 0.3 mg/dL (ref 0.3–1.2)
Total Protein: 8.2 g/dL — ABNORMAL HIGH (ref 6.5–8.1)

## 2020-12-03 LAB — CBC WITH DIFFERENTIAL (CANCER CENTER ONLY)
Abs Immature Granulocytes: 0.05 10*3/uL (ref 0.00–0.07)
Basophils Absolute: 0.1 10*3/uL (ref 0.0–0.1)
Basophils Relative: 1 %
Eosinophils Absolute: 0.3 10*3/uL (ref 0.0–0.5)
Eosinophils Relative: 4 %
HCT: 42.1 % (ref 39.0–52.0)
Hemoglobin: 13.6 g/dL (ref 13.0–17.0)
Immature Granulocytes: 1 %
Lymphocytes Relative: 20 %
Lymphs Abs: 1.8 10*3/uL (ref 0.7–4.0)
MCH: 28.8 pg (ref 26.0–34.0)
MCHC: 32.3 g/dL (ref 30.0–36.0)
MCV: 89 fL (ref 80.0–100.0)
Monocytes Absolute: 0.5 10*3/uL (ref 0.1–1.0)
Monocytes Relative: 6 %
Neutro Abs: 6.2 10*3/uL (ref 1.7–7.7)
Neutrophils Relative %: 68 %
Platelet Count: 322 10*3/uL (ref 150–400)
RBC: 4.73 MIL/uL (ref 4.22–5.81)
RDW: 14.6 % (ref 11.5–15.5)
WBC Count: 8.9 10*3/uL (ref 4.0–10.5)
nRBC: 0 % (ref 0.0–0.2)

## 2020-12-03 LAB — TSH: TSH: 2.039 u[IU]/mL (ref 0.350–4.500)

## 2020-12-03 MED ORDER — SODIUM CHLORIDE 0.9 % IV SOLN
3.0900 mg/kg | Freq: Once | INTRAVENOUS | Status: AC
  Administered 2020-12-03: 280 mg via INTRAVENOUS
  Filled 2020-12-03: qty 24

## 2020-12-03 MED ORDER — FAMOTIDINE 20 MG IN NS 100 ML IVPB
20.0000 mg | Freq: Once | INTRAVENOUS | Status: AC
Start: 2020-12-03 — End: 2020-12-03
  Administered 2020-12-03: 20 mg via INTRAVENOUS

## 2020-12-03 MED ORDER — DIPHENHYDRAMINE HCL 50 MG/ML IJ SOLN
25.0000 mg | Freq: Once | INTRAMUSCULAR | Status: AC
  Administered 2020-12-03: 25 mg via INTRAVENOUS

## 2020-12-03 MED ORDER — SODIUM CHLORIDE 0.9 % IV SOLN
Freq: Once | INTRAVENOUS | Status: AC
  Filled 2020-12-03: qty 250

## 2020-12-03 MED ORDER — FAMOTIDINE 20 MG IN NS 100 ML IVPB
INTRAVENOUS | Status: AC
  Filled 2020-12-03: qty 100

## 2020-12-03 MED ORDER — IPILIMUMAB CHEMO INJECTION 200 MG/40ML
1.0000 mg/kg | Freq: Once | INTRAVENOUS | Status: AC
  Administered 2020-12-03: 100 mg via INTRAVENOUS
  Filled 2020-12-03: qty 20

## 2020-12-03 MED ORDER — DIPHENHYDRAMINE HCL 50 MG/ML IJ SOLN
INTRAMUSCULAR | Status: AC
  Filled 2020-12-03: qty 1

## 2020-12-03 NOTE — Progress Notes (Signed)

## 2020-12-03 NOTE — Patient Instructions (Signed)
[facility]ICAL [facility]COL[facility]  Discharge Instructions: ?Thank you for choosing [facility] to provide your oncology and hematology care.  ? ?If you have a lab appointment with the Cancer Center, please go directly to the Cancer Center and check in at the registration area. ?  ?Wear comfortable clothing and clothing appropriate for easy access to any Portacath or PICC line.  ? ?We strive to give you quality time with your provider. You may need to reschedule your appointment if you arrive late (15 or more minutes).  Arriving late affects you and other patients whose appointments are after yours.  Also, if you miss three or more appointments without notifying the office, you may be dismissed from the clinic at the provider?s discretion.    ?  ?For prescription refill requests, have your pharmacy contact our office and allow 72 hours for refills to be completed.   ? ?Today you received the following chemotherapy and/or immunotherapy agents: Opdivo and Yervoy    ?  ?To help prevent nausea and vomiting after your treatment, we encourage you to take your nausea medication as directed. ? ?BELOW ARE SYMPTOMS THAT SHOULD BE REPORTED IMMEDIATELY: ?*FEVER GREATER THAN 100.4 F (38 ?C) OR HIGHER ?*CHILLS OR SWEATING ?*NAUSEA AND VOMITING THAT IS NOT CONTROLLED WITH YOUR NAUSEA MEDICATION ?*UNUSUAL SHORTNESS OF BREATH ?*UNUSUAL BRUISING OR BLEEDING ?*URINARY PROBLEMS (pain or burning when urinating, or frequent urination) ?*BOWEL PROBLEMS (unusual diarrhea, constipation, pain near the anus) ?TENDERNESS IN MOUTH AND THROAT WITH OR WITHOUT PRESENCE OF ULCERS (sore throat, sores in mouth, or a toothache) ?UNUSUAL RASH, SWELLING OR PAIN  ?UNUSUAL VAGINAL DISCHARGE OR ITCHING  ? ?Items with * indicate a potential emergency and should be followed up as soon as possible or go to the Emergency Department if any problems should occur. ? ?Please show the CHEMOTHERAPY ALERT CARD or IMMUNOTHERAPY ALERT CARD at  check-in to the Emergency Department and triage nurse. ? ?Should you have questions after your visit or need to cancel or reschedule your appointment, please contact [facility]  Dept: [phone]  and follow the prompts.  Office hours are 8:00 a.m. to 4:30 p.m. Monday - Friday. Please note that voicemails left after 4:00 p.m. may not be returned until the following business day.  We are closed weekends and major holidays. You have access to a nurse at all times for urgent questions. Please call the main number to the clinic Dept: [phone] and follow the prompts. ? ? ?For any non-urgent questions, you may also contact your provider using MyChart. We now offer e-Visits for anyone 18 and older to request care online for non-urgent symptoms. For details visit [email or web address]. ?  ?Also download the MyChart app! Go to the app store, search "MyChart", open the app, select [facility], and log in with your MyChart username and password. ? ?Due to Covid, a mask is required upon entering the hospital/clinic. If you do not have a mask, one will be given to you upon arrival. For doctor visits, patients may have 1 support person aged 18 or older with them. For treatment visits, patients cannot have anyone with them due to current Covid guidelines and our immunocompromised population.  ? ?

## 2020-12-03 NOTE — Progress Notes (Signed)
Pt received morphine injections during last 2 Opdivo infusions. After consulting w/ Dr. Alen Blew no addition premeds ordered. Opdivo started 30 minutes after premeds. Pt tolerated opdivo with only minimal pain and did not require morphine today.

## 2020-12-03 NOTE — Progress Notes (Signed)
Hematology and Oncology Follow Up Visit  Bob Ewing [id number] 04-17-1971 50 y.o. 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 He presented with left kidney mass with pulmonary and subcutaneous nodules.     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  Current therapy: Ipilimumab 1 mg/kg and nivolumab 3 mg/kg cycle 1 given on October 02, 2020.  He is here for cycle 4 of therapy.  Interim History: Bob Ewing is here for a follow-up visit.  Since the last visit, he reports no major changes in his health.  He has tolerated the last cycle of chemotherapy without any major complications.  He did report a minor back pain associated with the last infusion required 1 dose of morphine.  He denies any shortness of breath, cough or wheezing.  He denies any excessive fatigue or tiredness.  Eating well and gaining weight.  He is trying to exercise more regularly at this time.     Medications: Unchanged on review. Current Outpatient Medications  Medication Sig Dispense Refill  . aspirin 81 MG chewable tablet Chew 1 tablet (81 mg total) by mouth daily. 90 tablet 0  . atenolol (TENORMIN) 50 MG tablet Take 50 mg by mouth daily.    . diphenhydrAMINE (BENADRYL) 25 mg capsule Take 1 capsule (25 mg total) by mouth every 6 (six) hours as needed for up to 5 days for itching (rash). 20 capsule 0  . HYDROcodone-acetaminophen (NORCO) 10-325 MG tablet Take 1 tablet by mouth every 4 (four) hours as needed for moderate pain or severe pain. (Patient not taking: No sig reported)    . triamcinolone ointment (KENALOG) 0.5 % APPLY TOPICALLY TO THE AFFECTED AREA TWICE DAILY (Patient not taking: Reported on 11/26/2020) 30 g 0   No current facility-administered  medications for this visit.     Allergies:  Allergies  Allergen Reactions  . Phentermine     Kidney pain      Physical Exam: Blood pressure 135/80, pulse 75, temperature 97.9 F (36.6 C), temperature source Tympanic, resp. rate 18, height 5\' 8"  (1.727 m), weight 207 lb 3.2 oz (94 kg), SpO2 98 %.    ECOG: 1    General appearance: Comfortable appearing without any discomfort Head: Normocephalic without any trauma Oropharynx: Mucous membranes are moist and pink without any thrush or ulcers. Eyes: Pupils are equal and round reactive to light. Lymph nodes: No cervical, supraclavicular, inguinal or axillary lymphadenopathy.   Heart:regular rate and rhythm.  S1 and S2 without leg edema. Lung: Clear without any rhonchi or wheezes.  No dullness to percussion. Abdomin: Soft, nontender, nondistended with good bowel sounds.  No hepatosplenomegaly. Musculoskeletal: No joint deformity or effusion.  Full range of motion noted. Neurological: No deficits noted on motor, sensory and deep tendon reflex exam. Skin: No petechial rash or dryness.  Appeared moist.       Lab Results: Lab Results  Component Value Date   WBC 8.9 12/03/2020   HGB 13.6 12/03/2020   HCT 42.1 12/03/2020   MCV 89.0 12/03/2020   PLT 322 12/03/2020     Chemistry      Component Value Date/Time   NA 140 11/12/2020 0825   K 4.1 11/12/2020 0825   CL 105 11/12/2020 0825   CO2 22 11/12/2020 0825  BUN 24 (H) 11/12/2020 0825   CREATININE 1.23 11/12/2020 0825      Component Value Date/Time   CALCIUM 9.3 11/12/2020 0825   ALKPHOS 134 (H) 11/12/2020 0825   AST 17 11/12/2020 0825   ALT 51 (H) 11/12/2020 0825   BILITOT 0.2 (L) 11/12/2020 0825         Impression and Plan:  50 year old man with:  1.  Stage IV clear-cell renal cell carcinoma with rhabdoid features diagnosed in February 2022.  He was found to have metastasis involving the lung, abdominal adenopathy and subcutaneous nodules.    He has  tolerated this therapy without any major complications at this time.  Risks and benefits of continuing this treatment were discussed at this time and he is ready to proceed with cycle 4.  After this cycle we will update his staging scans and presumably proceed with that maintenance nivolumab pending his response.  Alternative treatment options would be oral targeted therapy with cabozantinib or axitinib.  He is agreeable to proceed with this plan.   2.  IV access: No issues reported with peripheral veins at this time.  3.  Antiemetics: No nausea or vomiting reported at this time.  Compazine is available to him.  4.  CNS surveillance: No evidence of metastasis noted and surveillance will be done annually unless he develops symptoms.  Next scan will be tentatively in March 2023.  5.  Pulmonary considerations: He had developed symptoms of shortness of breath related to his metastatic disease.  His symptoms have improved currently.  6.  Goals of care and prognosis: Therapy remains palliative although aggressive measures are warranted given his excellent performance status and young age.  7.  Immune mediated complications: Complications from immunotherapy including pneumonitis, colitis and thyroid disease were reiterated.  He is not experiencing any at this time.  8.  Follow-up: He will return in 3 weeks for repeat evaluation.  30  minutes were spent on this encounter.  The time was dedicated to reviewing disease status, treatment options and future plan of care review.    Zola Button, MD 5/5/202211:19 AM

## 2020-12-16 ENCOUNTER — Encounter: Payer: Self-pay | Admitting: Oncology

## 2020-12-16 ENCOUNTER — Other Ambulatory Visit: Payer: Self-pay | Admitting: Physician Assistant

## 2020-12-16 DIAGNOSIS — I639 Cerebral infarction, unspecified: Secondary | ICD-10-CM

## 2020-12-17 ENCOUNTER — Ambulatory Visit (HOSPITAL_COMMUNITY)
Admission: RE | Admit: 2020-12-17 | Discharge: 2020-12-17 | Disposition: A | Discharge status: Home / Self Care | Payer: 59 | Source: Ambulatory Visit | Attending: Oncology | Admitting: Oncology

## 2020-12-17 ENCOUNTER — Other Ambulatory Visit: Payer: Self-pay

## 2020-12-17 DIAGNOSIS — C642 Malignant neoplasm of left kidney, except renal pelvis: Secondary | ICD-10-CM | POA: Diagnosis present

## 2020-12-17 IMAGING — CT CT ABD-PELV W/O CM
2 of 4 series · 13 of 36 positions shown, 16 images · non-contrast
Comparison: Multiple exams, including [DATE] and [DATE]

CLINICAL DATA: Metastatic left renal cancer.

EXAM:
CT CHEST, ABDOMEN AND PELVIS WITHOUT CONTRAST
TECHNIQUE: Multidetector CT imaging of the chest, abdomen and pelvis was
performed following the standard protocol without IV contrast.

[Series 2: cap w/o · axial · non-contrast · 0.91mm/px · z∈[+760,+1344]mm · 10 of 144 slices shown, 13 images]
[im 14/144  mediastinal]
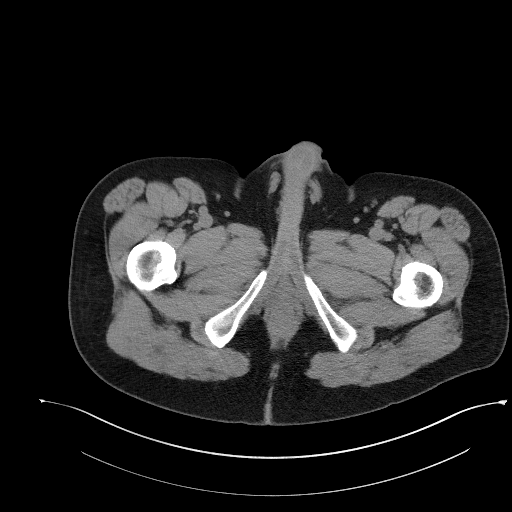
[im 14/144  lung]
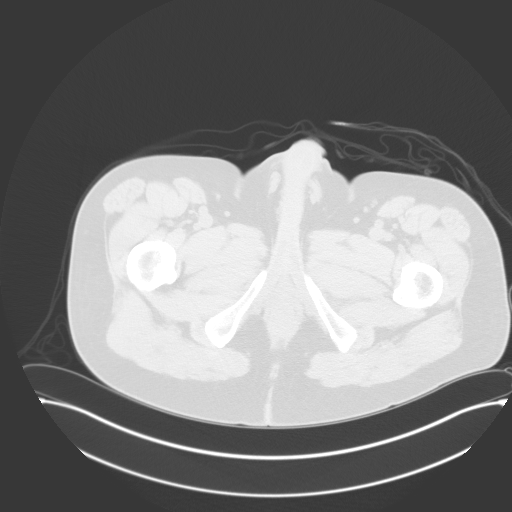
[im 27/144  lung]
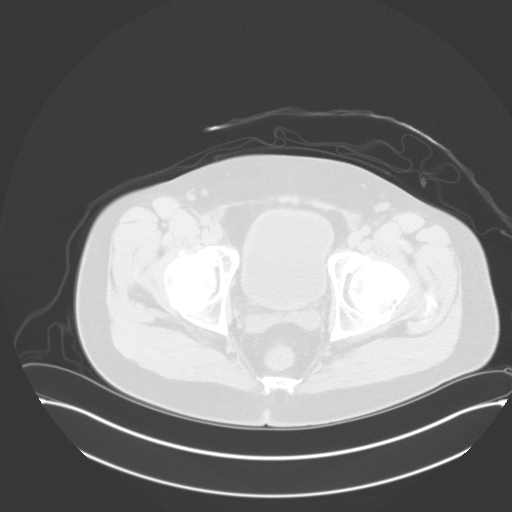
[im 40/144  lung]
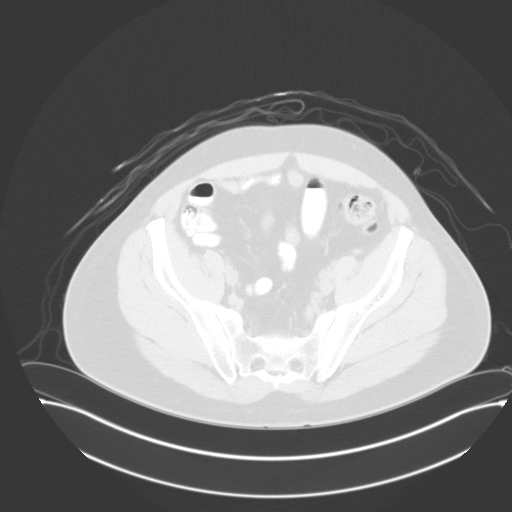
[im 53/144  lung]
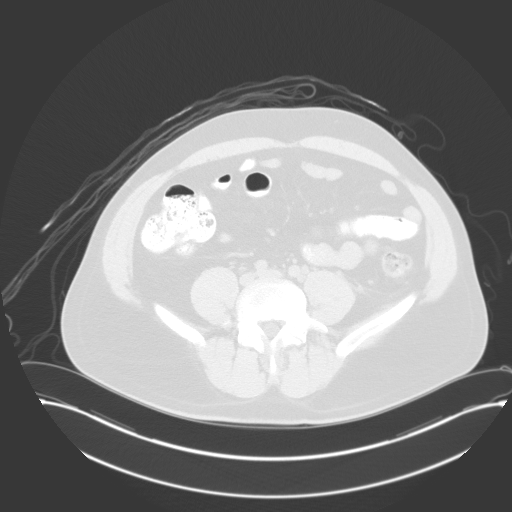
[im 66/144  mediastinal]
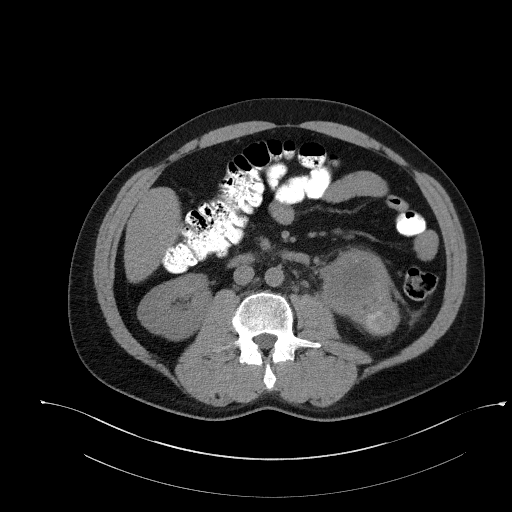
[im 66/144  lung]
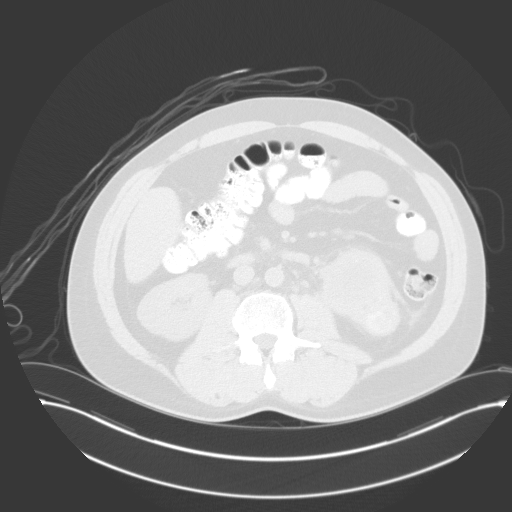
[im 79/144  lung]
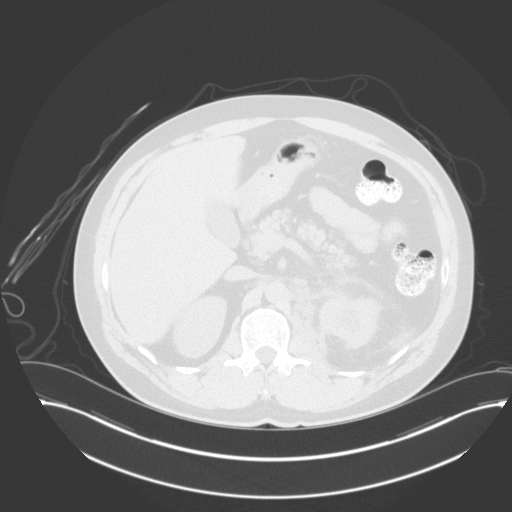
[im 92/144  lung]
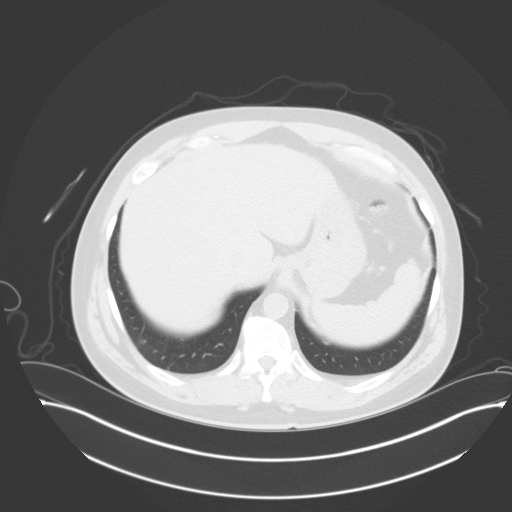
[im 105/144  lung]
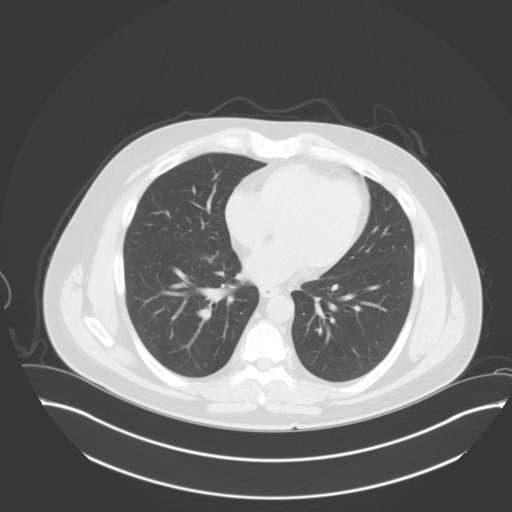
[im 118/144  mediastinal]
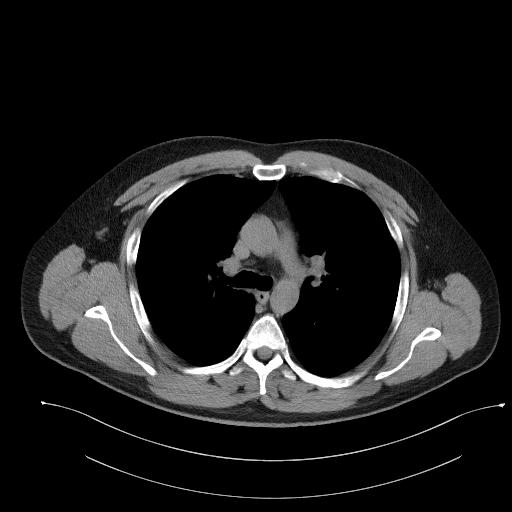
[im 118/144  lung]
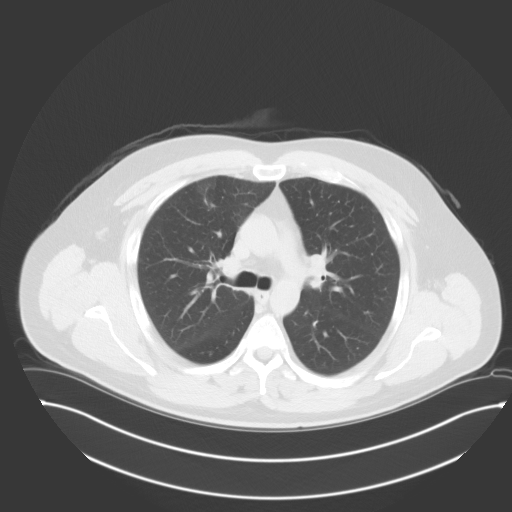
[im 131/144  lung]
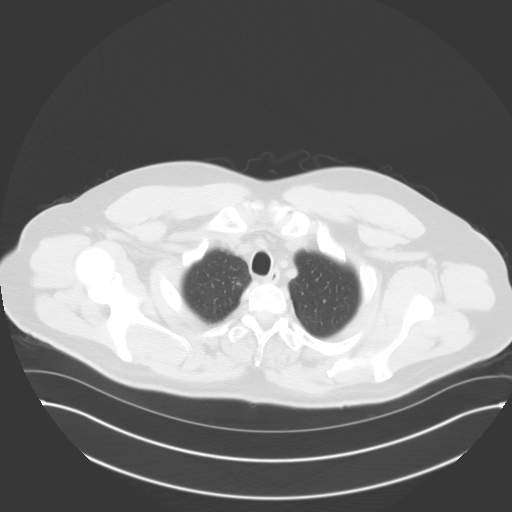

[Series 5: coronals · coronal · 0.92mm/px · 3 of 191 slices shown]
[im 39/191  lung]
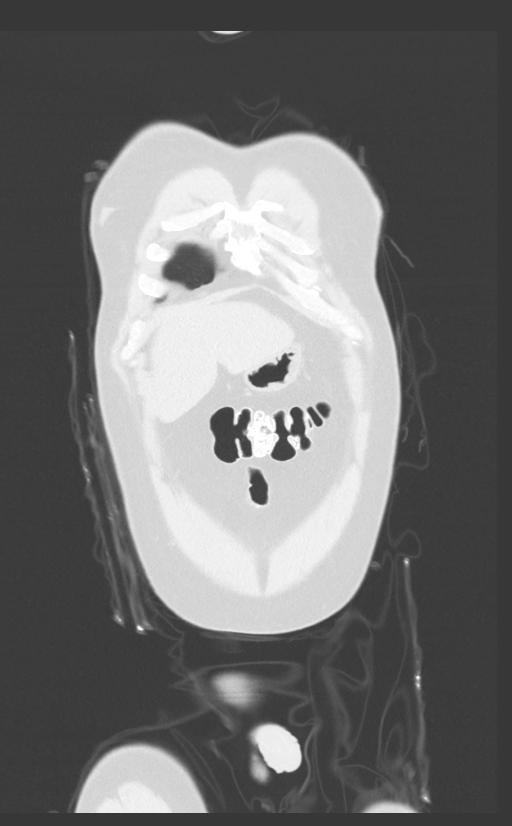
[im 77/191  lung]
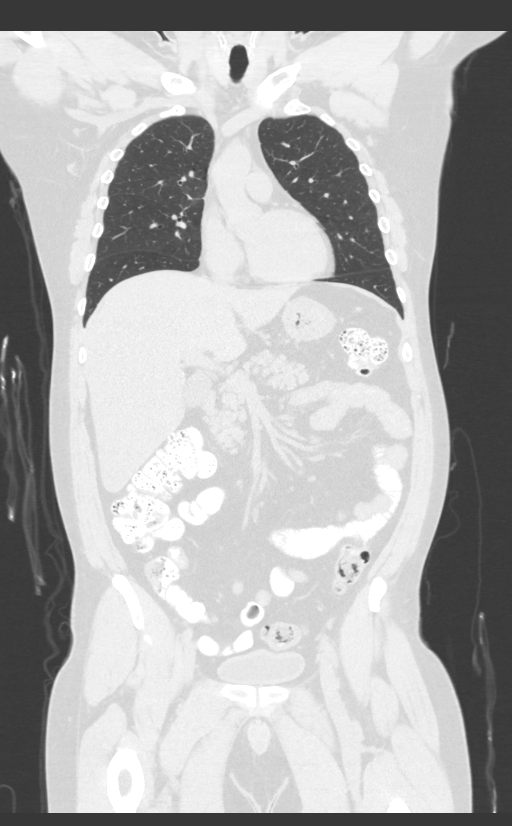
[im 115/191  lung]
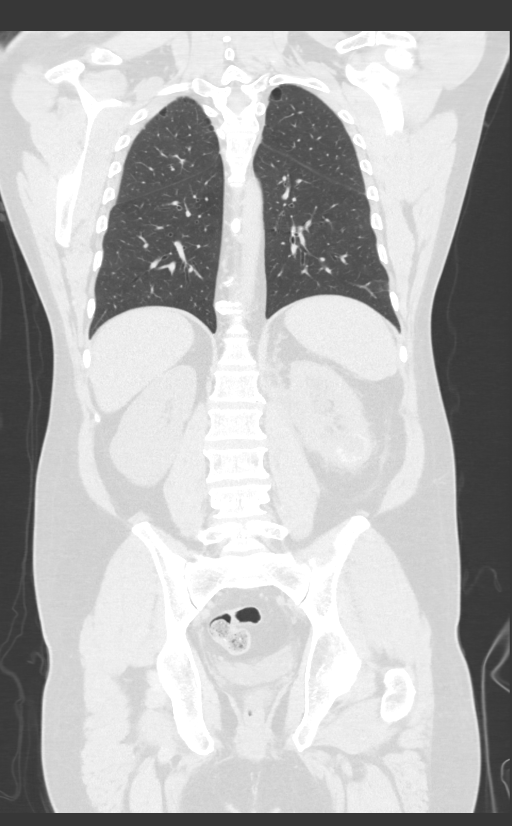

[13 of 36 positions shown; findings below may reference images not displayed]

FINDINGS: CT CHEST FINDINGS

Cardiovascular: Unremarkable

Mediastinum/Nodes: Unremarkable

Lungs/Pleura: Mild paraseptal emphysema the apices.

Markedly improved appearance of the metastatic lesions to the lungs,
with many of the lesions completely resolved and the remaining
lesions dramatically reduced in volume compared to [DATE]. For
example, a previous right apical 1.1 by 1.4 cm nodule is currently
ground-glass density and 0.6 by 0.3 cm on image 33 of series 4. A
previous 1.7 by 1.5 cm right basilar pulmonary nodule currently
measures 0.4 by 0.3 cm on image 110 series 4.

The extensive bilateral hilar adenopathy has resolved.

The previous large left pleural effusion has resolved.

Musculoskeletal: Interval sclerosis of the prior lytic right ninth
rib lesion.

CT ABDOMEN PELVIS FINDINGS

Hepatobiliary: Unremarkable

Pancreas: Unremarkable

Spleen: Unremarkable

Adrenals/Urinary Tract: The lobulated left renal mass measures about
9.2 by 7.0 cm on image 77 series 2, formerly 12.4 by 10.0 cm on
[DATE]. Marked reduction in tumor extension in the left
perirenal space, retroperitoneum, and along the left psoas muscle.
For example, enhancing tumor previously measuring 3.0 cm in
thickness in the left perirenal space adjacent to the upper pole of
the kidney currently measures 0.8 cm in thickness on image 65 series
2. The left periaortic adenopathy is considerably improved, with a
previous 3.0 cm in short axis left periaortic lymph node measuring
1.0 cm on image 74 series 2.

Right renal contour remains unremarkable. Adrenal glands
unremarkable.

Stomach/Bowel: Unremarkable

Vascular/Lymphatic: Substantially improved left periaortic
adenopathy at the level of the kidneys as noted above. Stable small
left iliac lymph nodes.

Reproductive: Mild prostatomegaly.

Other: No supplemental non-categorized findings.

Musculoskeletal: Unremarkable
IMPRESSION: 1. Marked improvement with resolution many of the metastatic lesions
to the lungs, and with other metastatic lung lesions markedly
reduced in volume. Prior thoracic adenopathy and prior left pleural
effusion have resolved.
2. Considerable reduction in size of the left renal mass and
considerable reduction in adjacent periaortic adenopathy.
Considerable reduction in the tumor extending in the left perirenal
space.
3. Interval sclerotic healing of the prior lytic right ninth rib
lesion.
4. Other imaging findings of potential clinical significance:
Emphysema ([RA]-[RA]). Mild prostatomegaly.

## 2020-12-18 ENCOUNTER — Telehealth: Payer: Self-pay | Admitting: Neurology

## 2020-12-18 NOTE — Telephone Encounter (Signed)
I called the patient.  The patient had a negative cardiac monitor study, he will remain on 81 mg of aspirin.  No indication to change medical therapy.   Cardiac monitor 12/18/20:  Narrative & Impression Normal sinus rhythm No significant abnormalities Significant artifact and lead loss

## 2020-12-23 NOTE — Progress Notes (Signed)
Cardiology Office Note   Date:  12/24/2020   ID:  Bob Ewing, DOB 1971/02/18, MRN [id number]  PCP:  [name], MD  Cardiologist:   None Referring:  [name], MD  No chief complaint on file.     History of Present Illness: Bob Ewing is a 50 y.o. male who presents for follow up after a CVA.  Echo demonstrated an EF of 60 - 65%.   The punctate lacunar lesions in the left frontal white matter was found when he had a recent MRI to assess metastatic renal cell cancer.  Monitor demonstrated no arrhythmia.  He was placed on ASA.      He has metastatic renal cell cancer and was in the hospital in March with large pleural effusion.  He has had no past cardiac history.  He does not have any history reminiscent of prior stroke.  He has a history of tobacco abuse and hypertension.  He is actually trying to be a little more physically active and is taking phentermine to try to lose weight.  His blood pressure currently is well controlled on the meds as listed.  He is not having any cardiovascular symptoms and particularly denies chest pressure, neck or arm discomfort.  He has no new shortness of breath, PND or orthopnea.  No palpitations, presyncope or syncope.  He is trying to walk about 4 miles a day.  He is not able to jog anymore as he does not have this energy.     Past Medical History:  Diagnosis Date  . Cancer ([place])   . Hypertension   . Stroke [facility])     No past surgical history on file.   Current Outpatient Medications  Medication Sig Dispense Refill  . aspirin 81 MG chewable tablet Chew 1 tablet (81 mg total) by mouth daily. 90 tablet 0  . atenolol (TENORMIN) 50 MG tablet Take 50 mg by mouth daily.    . diphenhydrAMINE (BENADRYL) 25 mg capsule Take 1 capsule (25 mg total) by mouth every 6 (six) hours as needed for up to 5 days for itching (rash). 20 capsule 0  . HYDROcodone-acetaminophen (NORCO) 10-325 MG tablet Take 1 tablet by mouth every 4 (four) hours as needed  for moderate pain or severe pain.    . phentermine 37.5 MG capsule Take 37.5 mg by mouth every morning.    . triamcinolone ointment (KENALOG) 0.5 % APPLY TOPICALLY TO THE AFFECTED AREA TWICE DAILY 30 g 0  . hydrochlorothiazide (HYDRODIURIL) 25 MG tablet Take 1 tablet by mouth every morning.    . metFORMIN (GLUCOPHAGE) 500 MG tablet Take 1 tablet by mouth daily.     No current facility-administered medications for this visit.    Allergies:   Phentermine    Social History:  The patient  reports that he has been smoking cigarettes. He has been smoking about 0.25 packs per day. He has never used smokeless tobacco. He reports current alcohol use. He reports that he does not use drugs.   Family History:  The patient's family history includes Diabetes in his father; Stroke in his mother.    ROS:  Please see the history of present illness.   Otherwise, review of systems are positive for none.   All other systems are reviewed and negative.    PHYSICAL EXAM: VS:  BP 134/76   Pulse 78   Ht 5' 7.5" (1.715 m)   Wt 210 lb (95.3 kg)   SpO2 100%  BMI 32.41 kg/m  , BMI Body mass index is 32.41 kg/m. GENERAL:  Well appearing HEENT:  Pupils equal round and reactive, fundi not visualized, oral mucosa unremarkable NECK:  No jugular venous distention, waveform within normal limits, carotid upstroke brisk and symmetric, no bruits, no thyromegaly LYMPHATICS:  No cervical, inguinal adenopathy LUNGS:  Clear to auscultation bilaterally BACK:  No CVA tenderness CHEST:  Unremarkable HEART:  PMI not displaced or sustained,S1 and S2 within normal limits, no S3, no S4, no clicks, no rubs, no murmurs ABD:  Flat, positive bowel sounds normal in frequency in pitch, no bruits, no rebound, no guarding, no midline pulsatile mass, no hepatomegaly, no splenomegaly EXT:  2 plus pulses throughout, no edema, no cyanosis no clubbing SKIN:  No rashes no nodules NEURO:  Cranial nerves II through XII grossly intact, motor  grossly intact throughout PSYCH:  Cognitively intact, oriented to person place and time    EKG:  EKG is ordered today. The ekg ordered today demonstrates sinus rhythm, rate 78, axis within normal limits, intervals within normal limits, no acute ST-T wave changes.   Recent Labs: 10/14/2020: B Natriuretic Peptide 27.9 12/24/2020: ALT 52; BUN 22; Creatinine 1.24; Hemoglobin 14.3; Platelet Count 301; Potassium 3.7; Sodium 139; TSH 2.238    Lipid Panel    Component Value Date/Time   CHOL 149 10/15/2020 0618   TRIG 171 (H) 10/15/2020 0618   HDL 18 (L) 10/15/2020 0618   CHOLHDL 8.3 10/15/2020 0618   VLDL 34 10/15/2020 0618   LDLCALC 97 10/15/2020 0618   LDLDIRECT 99.4 (H) 10/14/2020 0834      Wt Readings from Last 3 Encounters:  12/24/20 208 lb 9.6 oz (94.6 kg)  12/24/20 210 lb (95.3 kg)  12/03/20 207 lb 3.2 oz (94 kg)      Other studies Reviewed: Additional studies/ records that were reviewed today include:   Hospital records, Oncology records, neurology notes.  . Review of the above records demonstrates:  Please see elsewhere in the note.     ASSESSMENT AND PLAN:  CVA: The patient had some incidental findings on his MRI.  I do not strongly suspect a cardiac etiology.  There is been no suggestion of an arrhythmia.  No further work up is indicated.    PLEURAL EFFUSION:  This seemed to be resolved on CT earlier this onth     Current medicines are reviewed at length with the patient today.  The patient does not have concerns regarding medicines.  The following changes have been made:  no change  Labs/ tests ordered today include:   Orders Placed This Encounter  Procedures  . EKG 12-Lead     Disposition:   FU with me as needed.      Signed, Minus Breeding, MD  12/24/2020 6:05 PM    [place]

## 2020-12-24 ENCOUNTER — Inpatient Hospital Stay (HOSPITAL_BASED_OUTPATIENT_CLINIC_OR_DEPARTMENT_OTHER): Payer: 59 | Admitting: Oncology

## 2020-12-24 ENCOUNTER — Inpatient Hospital Stay: Payer: 59

## 2020-12-24 ENCOUNTER — Ambulatory Visit (INDEPENDENT_AMBULATORY_CARE_PROVIDER_SITE_OTHER): Payer: 59 | Admitting: Cardiology

## 2020-12-24 ENCOUNTER — Other Ambulatory Visit: Payer: Self-pay

## 2020-12-24 ENCOUNTER — Encounter: Payer: Self-pay | Admitting: Cardiology

## 2020-12-24 VITALS — BP 137/86 | HR 77 | Temp 95.7°F | Resp 17 | Ht 67.5 in | Wt 208.6 lb

## 2020-12-24 VITALS — BP 134/76 | HR 78 | Ht 67.5 in | Wt 210.0 lb

## 2020-12-24 DIAGNOSIS — J90 Pleural effusion, not elsewhere classified: Secondary | ICD-10-CM

## 2020-12-24 DIAGNOSIS — I639 Cerebral infarction, unspecified: Secondary | ICD-10-CM

## 2020-12-24 DIAGNOSIS — C642 Malignant neoplasm of left kidney, except renal pelvis: Secondary | ICD-10-CM | POA: Diagnosis not present

## 2020-12-24 DIAGNOSIS — Z5112 Encounter for antineoplastic immunotherapy: Secondary | ICD-10-CM | POA: Diagnosis not present

## 2020-12-24 LAB — CBC WITH DIFFERENTIAL (CANCER CENTER ONLY)
Abs Immature Granulocytes: 0.03 10*3/uL (ref 0.00–0.07)
Basophils Absolute: 0 10*3/uL (ref 0.0–0.1)
Basophils Relative: 1 %
Eosinophils Absolute: 0.2 10*3/uL (ref 0.0–0.5)
Eosinophils Relative: 2 %
HCT: 44.2 % (ref 39.0–52.0)
Hemoglobin: 14.3 g/dL (ref 13.0–17.0)
Immature Granulocytes: 0 %
Lymphocytes Relative: 22 %
Lymphs Abs: 1.6 10*3/uL (ref 0.7–4.0)
MCH: 29.1 pg (ref 26.0–34.0)
MCHC: 32.4 g/dL (ref 30.0–36.0)
MCV: 89.8 fL (ref 80.0–100.0)
Monocytes Absolute: 0.4 10*3/uL (ref 0.1–1.0)
Monocytes Relative: 6 %
Neutro Abs: 5.2 10*3/uL (ref 1.7–7.7)
Neutrophils Relative %: 69 %
Platelet Count: 301 10*3/uL (ref 150–400)
RBC: 4.92 MIL/uL (ref 4.22–5.81)
RDW: 14.6 % (ref 11.5–15.5)
WBC Count: 7.4 10*3/uL (ref 4.0–10.5)
nRBC: 0 % (ref 0.0–0.2)

## 2020-12-24 LAB — CMP (CANCER CENTER ONLY)
ALT: 52 U/L — ABNORMAL HIGH (ref 0–44)
AST: 24 U/L (ref 15–41)
Albumin: 4.2 g/dL (ref 3.5–5.0)
Alkaline Phosphatase: 92 U/L (ref 38–126)
Anion gap: 9 (ref 5–15)
BUN: 22 mg/dL — ABNORMAL HIGH (ref 6–20)
CO2: 26 mmol/L (ref 22–32)
Calcium: 9.8 mg/dL (ref 8.9–10.3)
Chloride: 104 mmol/L (ref 98–111)
Creatinine: 1.24 mg/dL (ref 0.61–1.24)
GFR, Estimated: >60 mL/min (ref >60)
Glucose, Bld: 142 mg/dL — ABNORMAL HIGH (ref 70–99)
Potassium: 3.7 mmol/L (ref 3.5–5.1)
Sodium: 139 mmol/L (ref 135–145)
Total Bilirubin: 0.3 mg/dL (ref 0.3–1.2)
Total Protein: 8 g/dL (ref 6.5–8.1)

## 2020-12-24 LAB — TSH: TSH: 2.238 u[IU]/mL (ref 0.320–4.118)

## 2020-12-24 MED ORDER — ACETAMINOPHEN 325 MG PO TABS
ORAL_TABLET | ORAL | Status: AC
  Filled 2020-12-24: qty 2

## 2020-12-24 MED ORDER — SODIUM CHLORIDE 0.9 % IV SOLN
240.0000 mg | Freq: Once | INTRAVENOUS | Status: AC
  Administered 2020-12-24: 240 mg via INTRAVENOUS
  Filled 2020-12-24: qty 24

## 2020-12-24 MED ORDER — SODIUM CHLORIDE 0.9 % IV SOLN
Freq: Once | INTRAVENOUS | Status: AC
  Filled 2020-12-24: qty 250

## 2020-12-24 MED ORDER — ZOLEDRONIC ACID 4 MG/100ML IV SOLN
4.0000 mg | Freq: Once | INTRAVENOUS | Status: AC
  Administered 2020-12-24: 4 mg via INTRAVENOUS

## 2020-12-24 MED ORDER — ZOLEDRONIC ACID 4 MG/100ML IV SOLN
INTRAVENOUS | Status: AC
  Filled 2020-12-24: qty 100

## 2020-12-24 MED ORDER — ACETAMINOPHEN 325 MG PO TABS
650.0000 mg | ORAL_TABLET | Freq: Once | ORAL | Status: AC
  Administered 2020-12-24: 650 mg via ORAL

## 2020-12-24 NOTE — Patient Instructions (Signed)
Medication Instructions:  No Changes In Medications at this time.  *If you need a refill on your cardiac medications before your next appointment, please call your pharmacy*  Follow-Up: At [facility], you and your health needs are our priority.  As part of our continuing mission to provide you with exceptional heart care, we have created designated Provider Care Teams.  These Care Teams include your primary Cardiologist (physician) and Advanced Practice Providers (APPs -  Physician Assistants and Nurse Practitioners) who all work together to provide you with the care you need, when you need it.  Your next app ointment:   AS NEEDED   The format for your next appointment:   In Person  Provider:   Minus Breeding, MD  Other Instructions [phone]

## 2020-12-24 NOTE — Patient Instructions (Signed)
[facility] ONCOLOGY    Discharge Instructions: Thank you for choosing [facility] to provide your oncology and hematology care.   If you have a lab appointment with the [facility], please go directly to the [facility] and check in at the registration area.   Wear comfortable clothing and clothing appropriate for easy access to any Portacath or PICC line.   We strive to give you quality time with your provider. You may need to reschedule your appointment if you arrive late (15 or more minutes).  Arriving late affects you and other patients whose appointments are after yours.  Also, if you miss three or more appointments without notifying the office, you may be dismissed from the clinic at the provider's discretion.      For prescription refill requests, have your pharmacy contact our office and allow 72 hours for refills to be completed.    Today you received the following chemotherapy and/or immunotherapy agents: nivolumab.      To help prevent nausea and vomiting after your treatment, we encourage you to take your nausea medication as directed.  BELOW ARE SYMPTOMS THAT SHOULD BE REPORTED IMMEDIATELY: . *FEVER GREATER THAN 100.4 F (38 C) OR HIGHER . *CHILLS OR SWEATING . *NAUSEA AND VOMITING THAT IS NOT CONTROLLED WITH YOUR NAUSEA MEDICATION . *UNUSUAL SHORTNESS OF BREATH . *UNUSUAL BRUISING OR BLEEDING . *URINARY PROBLEMS (pain or burning when urinating, or frequent urination) . *BOWEL PROBLEMS (unusual diarrhea, constipation, pain near the anus) . TENDERNESS IN MOUTH AND THROAT WITH OR WITHOUT PRESENCE OF ULCERS (sore throat, sores in mouth, or a toothache) . UNUSUAL RASH, SWELLING OR PAIN  . UNUSUAL VAGINAL DISCHARGE OR ITCHING   Items with * indicate a potential emergency and should be followed up as soon as possible or go to the Emergency Department if any problems should occur.  Please show the CHEMOTHERAPY ALERT CARD or IMMUNOTHERAPY  ALERT CARD at check-in to the Emergency Department and triage nurse.  Should you have questions after your visit or need to cancel or reschedule your appointment, please contact [facility]  Dept: [phone]  and follow the prompts.  Office hours are 8:00 a.m. to 4:30 p.m. Monday - Friday. Please note that voicemails left after 4:00 p.m. may not be returned until the following business day.  We are closed weekends and major holidays. You have access to a nurse at all times for urgent questions. Please call the main number to the clinic Dept: [phone] and follow the prompts.   For any non-urgent questions, you may also contact your provider using MyChart. We now offer e-Visits for anyone 16 and older to request care online for non-urgent symptoms. For details visit [email or web address].   Also download the MyChart app! Go to the app store, search "MyChart", open the app, select [facility], and log in with your MyChart username and password.  Due to Covid, a mask is required upon entering the hospital/clinic. If you do not have a mask, one will be given to you upon arrival. For doctor visits, patients may have 1 support person aged 40 or older with them. For treatment visits, patients cannot have anyone with them due to current Covid guidelines and our immunocompromised population.   Zoledronic Acid Injection (Hypercalcemia, Oncology) What is this medicine? ZOLEDRONIC ACID (ZOE le dron ik AS id) slows calcium loss from bones. It high calcium levels in the blood from some kinds of cancer. It may be  used in other people at risk for bone loss. This medicine may be used for other purposes; ask your health care provider or pharmacist if you have questions. COMMON BRAND NAME(S): Zometa What should I tell my health care provider before I take this medicine? They need to know if you have any of these conditions:  cancer  dehydration  dental disease  kidney  disease  liver disease  low levels of calcium in the blood  lung or breathing disease (asthma)  receiving steroids like dexamethasone or prednisone  an unusual or allergic reaction to zoledronic acid, other medicines, foods, dyes, or preservatives  pregnant or trying to get pregnant  breast-feeding How should I use this medicine? This drug is injected into a vein. It is given by a health care provider in a hospital or clinic setting. Talk to your health care provider about the use of this drug in children. Special care may be needed. Overdosage: If you think you have taken too much of this medicine contact a poison control center or emergency room at once. NOTE: This medicine is only for you. Do not share this medicine with others. What if I miss a dose? Keep appointments for follow-up doses. It is important not to miss your dose. Call your health care provider if you are unable to keep an appointment. What may interact with this medicine?  certain antibiotics given by injection  NSAIDs, medicines for pain and inflammation, like ibuprofen or naproxen  some diuretics like bumetanide, furosemide  teriparatide  thalidomide This list may not describe all possible interactions. Give your health care provider a list of all the medicines, herbs, non-prescription drugs, or dietary supplements you use. Also tell them if you smoke, drink alcohol, or use illegal drugs. Some items may interact with your medicine. What should I watch for while using this medicine? Visit your health care provider for regular checks on your progress. It may be some time before you see the benefit from this drug. Some people who take this drug have severe bone, joint, or muscle pain. This drug may also increase your risk for jaw problems or a broken thigh bone. Tell your health care provider right away if you have severe pain in your jaw, bones, joints, or muscles. Tell you health care provider if you have any  pain that does not go away or that gets worse. Tell your dentist and dental surgeon that you are taking this drug. You should not have major dental surgery while on this drug. See your dentist to have a dental exam and fix any dental problems before starting this drug. Take good care of your teeth while on this drug. Make sure you see your dentist for regular follow-up appointments. You should make sure you get enough calcium and vitamin D while you are taking this drug. Discuss the foods you eat and the vitamins you take with your health care provider. Check with your health care provider if you have severe diarrhea, nausea, and vomiting, or if you sweat a lot. The loss of too much body fluid may make it dangerous for you to take this drug. You may need blood work done while you are taking this drug. Do not become pregnant while taking this drug. Women should inform their health care provider if they wish to become pregnant or think they might be pregnant. There is potential for serious harm to an unborn child. Talk to your health care provider for more information. What side effects may I  notice from receiving this medicine? Side effects that you should report to your doctor or health care provider as soon as possible:  allergic reactions (skin rash, itching or hives; swelling of the face, lips, or tongue)  bone pain  infection (fever, chills, cough, sore throat, pain or trouble passing urine)  jaw pain, especially after dental work  joint pain  kidney injury (trouble passing urine or change in the amount of urine)  low blood pressure (dizziness; feeling faint or lightheaded, falls; unusually weak or tired)  low calcium levels (fast heartbeat; muscle cramps or pain; pain, tingling, or numbness in the hands or feet; seizures)  low magnesium levels (fast, irregular heartbeat; muscle cramp or pain; muscle weakness; tremors; seizures)  low red blood cell counts (trouble breathing; feeling  faint; lightheaded, falls; unusually weak or tired)  muscle pain  redness, blistering, peeling, or loosening of the skin, including inside the mouth  severe diarrhea  swelling of the ankles, feet, hands  trouble breathing Side effects that usually do not require medical attention (report to your doctor or health care provider if they continue or are bothersome):  anxious  constipation  coughing  depressed mood  eye irritation, itching, or pain  fever  general ill feeling or flu-like symptoms  nausea  pain, redness, or irritation at site where injected  trouble sleeping This list may not describe all possible side effects. Call your doctor for medical advice about side effects. You may report side effects to FDA at 1-800-FDA-1088. Where should I keep my medicine? This drug is given in a hospital or clinic. It will not be stored at home. NOTE: This sheet is a summary. It may not cover all possible information. If you have questions about this medicine, talk to your doctor, pharmacist, or health care provider.  2021 Elsevier/Gold Standard (2019-05-02 09:13:00)

## 2020-12-24 NOTE — Progress Notes (Signed)
Hematology and Oncology Follow Up Visit  Bob Ewing [id number] Jun 04, 1971 50 y.o. 12/24/2020 12:41 PM Bob Ewing, MDShadad, Bob Dad, MD   Principle Diagnosis: 7 year old man with kidney cancer diagnosed in February 2022.  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   Ipilimumab 1 mg/kg and nivolumab 3 mg/kg cycle 1 given on October 02, 2020.  Completed cycle 4 of therapy on Dec 03, 2020.  Current therapy: Nivolumab 480 mg every 4 weeks.  He is here for cycle 1.  Interim History: Bob Ewing returns today for repeat evaluation.  Since the last visit, he completed 4 cycles of ipilimumab and nivolumab without any major complications.  He denies any nausea, vomiting or abdominal pain.  He denies any bone pain or pathological fractures.  He denies any respiratory complaints including shortness of breath, difficulty breathing or dyspnea exertion.  He is eating well and continues to gain weight.     Medications: Updated on review. Current Outpatient Medications  Medication Sig Dispense Refill  . aspirin 81 MG chewable tablet Chew 1 tablet (81 mg total) by mouth daily. 90 tablet 0  . atenolol (TENORMIN) 50 MG tablet Take 50 mg by mouth daily.    . diphenhydrAMINE (BENADRYL) 25 mg capsule Take 1 capsule (25 mg total) by mouth every 6 (six) hours as needed for up to 5 days for itching (rash). 20 capsule 0  . hydrochlorothiazide (HYDRODIURIL) 25 MG tablet Take 1 tablet by mouth every morning.    Marland Kitchen HYDROcodone-acetaminophen (NORCO) 10-325 MG tablet Take 1 tablet by mouth every 4 (four) hours as needed for moderate pain or severe pain.    . metFORMIN (GLUCOPHAGE) 500 MG tablet Take 1 tablet by mouth daily.    . phentermine 37.5 MG capsule Take  37.5 mg by mouth every morning.    . triamcinolone ointment (KENALOG) 0.5 % APPLY TOPICALLY TO THE AFFECTED AREA TWICE DAILY 30 g 0   No current facility-administered medications for this visit.     Allergies:  Allergies  Allergen Reactions  . Phentermine     Kidney pain      Physical Exam:  Blood pressure 137/86, pulse 77, temperature (!) 95.7 F (35.4 C), temperature source Tympanic, resp. rate 17, height 5' 7.5" (1.715 m), weight 208 lb 9.6 oz (94.6 kg), SpO2 100 %.    ECOG: 1    General appearance: Alert, awake without any distress. Head: Atraumatic without abnormalities Oropharynx: Without any thrush or ulcers. Eyes: No scleral icterus. Lymph nodes: No lymphadenopathy noted in the cervical, supraclavicular, or axillary nodes Heart:regular rate and rhythm, without any murmurs or gallops.   Lung: Clear to auscultation without any rhonchi, wheezes or dullness to percussion. Abdomin: Soft, nontender without any shifting dullness or ascites. Musculoskeletal: No clubbing or cyanosis. Neurological: No motor or sensory deficits. Skin: No rashes or lesions.       Lab Results: Lab Results  Component Value Date   WBC 8.9 12/03/2020   HGB 13.6 12/03/2020   HCT 42.1 12/03/2020   MCV 89.0 12/03/2020   PLT 322 12/03/2020     Chemistry      Component Value Date/Time   NA 137 12/03/2020 1107   K 3.9 12/03/2020 1107   CL 102 12/03/2020 1107   CO2 25  12/03/2020 1107   BUN 19 12/03/2020 1107   CREATININE 1.28 (H) 12/03/2020 1107      Component Value Date/Time   CALCIUM 9.5 12/03/2020 1107   ALKPHOS 113 12/03/2020 1107   AST 20 12/03/2020 1107   ALT 43 12/03/2020 1107   BILITOT 0.3 12/03/2020 1107       IMPRESSION: 1. Marked improvement with resolution many of the metastatic lesions to the lungs, and with other metastatic lung lesions markedly reduced in volume. Prior thoracic adenopathy and prior left pleural effusion have resolved. 2. Considerable  reduction in size of the left renal mass and considerable reduction in adjacent periaortic adenopathy. Considerable reduction in the tumor extending in the left perirenal space. 3. Interval sclerotic healing of the prior lytic right ninth rib lesion. 4. Other imaging findings of potential clinical significance: Emphysema (ICD10-J43.9). Mild prostatomegaly.   Impression and Plan:  50 year old man with:  1.  Kidney cancer diagnosed in February 2022.  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  His disease status was updated at this time and treatment options were reviewed.  CT scan obtained on Dec 17, 2020 showed a significant improvement in his disease status with very large elevation of the majority of his metastatic sites.  Risks and benefits of continued nivolumab maintenance were reviewed at this time.  Complications include immune mediated complications, dermatitis along others were discussed.  Salvage nephrectomy could be considered if he has a complete response to therapy in the future.  He is agreeable to continue at this time.   2.  IV access: Peripheral veins will continue to be in use at this time.  3.  Antiemetics: Compazine is available to him without any nausea or vomiting.  4.  CNS surveillance: No evidence of metastatic disease noted based on images in March 2022.  5.  Pulmonary considerations: His respiratory status improved with reduction of his cancer burden.  6.  Goals of care and prognosis: His disease is unlikely to be curable but aggressive measures are warranted given his excellent response and age.  7.  Immune mediated complications: Complications including pneumonitis, colitis and thyroid disease were reiterated.  He is not experiencing any at this time.  8.  Follow-up: Every 4 weeks for nivolumab maintenance.  30  minutes were spent on this visit.  The time was  dedicated to reviewing laboratory data, disease status update, reviewing imaging studies and future plan of care review.    Zola Button, MD 5/26/202212:41 PM

## 2021-01-20 NOTE — Progress Notes (Signed)
@Patient  ID: Bob Ewing, male    DOB: 1970-12-28, 50 y.o.   MRN: [id number]  Chief Complaint  Patient presents with   Consult    Seen in hospital in March for pleural effusion, reports having improved shortness of breath.     Referring provider: [name], MD  HPI:   50 year old with metastatic RCC here for follow up after pulmonary consultation in the hospital for pleural effusion. D/ C summary reviewed. Oncology note reviewed.   Doing well. Feeling fine. Breathing stable, improved. Does not feel like fluid has returned. Reviewed CT chest 10/14/2020 with pulmonary mets and L sided effusion on my interpretation.   Questionaires / Pulmonary Flowsheets:   ACT:  No flowsheet data found.  MMRC: No flowsheet data found.  [facility]:  No flowsheet data found.  Tests:   FENO:  No results found for: NITRICOXIDE  PFT: No flowsheet data found.  WALK:  No flowsheet data found.  Imaging: No results found.  Lab Results:  CBC    Component Value Date/Time   WBC 7.4 12/24/2020 1242   WBC 10.9 (H) 10/15/2020 0618   RBC 4.92 12/24/2020 1242   HGB 14.3 12/24/2020 1242   HCT 44.2 12/24/2020 1242   PLT 301 12/24/2020 1242   MCV 89.8 12/24/2020 1242   MCH 29.1 12/24/2020 1242   MCHC 32.4 12/24/2020 1242   RDW 14.6 12/24/2020 1242   LYMPHSABS 1.6 12/24/2020 1242   MONOABS 0.4 12/24/2020 1242   EOSABS 0.2 12/24/2020 1242   BASOSABS 0.0 12/24/2020 1242    BMET    Component Value Date/Time   NA 139 12/24/2020 1242   K 3.7 12/24/2020 1242   CL 104 12/24/2020 1242   CO2 26 12/24/2020 1242   GLUCOSE 142 (H) 12/24/2020 1242   BUN 22 (H) 12/24/2020 1242   CREATININE 1.24 12/24/2020 1242   CALCIUM 9.8 12/24/2020 1242   GFRNONAA >60 12/24/2020 1242   GFRAA >60 05/03/2020 0244    BNP    Component Value Date/Time   BNP 27.9 10/14/2020 0132    ProBNP No results found for: PROBNP  Specialty Problems       Pulmonary Problems   Large pleural effusion   Pleural  effusion    Allergies  Allergen Reactions   Phentermine     Kidney pain     There is no immunization history on file for this patient.  Past Medical History:  Diagnosis Date   Cancer ([facility])    Hypertension    Stroke ([facility])     Tobacco History: Social History   Tobacco Use  Smoking Status Every Day   Packs/day: 0.25   Pack years: 0.00   Types: Cigarettes  Smokeless Tobacco Never   Ready to quit: Not Answered Counseling given: Not Answered   Continue to not smoke  Outpatient Encounter Medications as of 11/26/2020  Medication Sig   aspirin 81 MG chewable tablet Chew 1 tablet (81 mg total) by mouth daily.   atenolol (TENORMIN) 50 MG tablet Take 50 mg by mouth daily.   diphenhydrAMINE (BENADRYL) 25 mg capsule Take 1 capsule (25 mg total) by mouth every 6 (six) hours as needed for up to 5 days for itching (rash).   HYDROcodone-acetaminophen (NORCO) 10-325 MG tablet Take 1 tablet by mouth every 4 (four) hours as needed for moderate pain or severe pain.   triamcinolone ointment (KENALOG) 0.5 % APPLY TOPICALLY TO THE AFFECTED AREA TWICE DAILY   [DISCONTINUED] HYDROcodone-homatropine (HYCODAN) 5-1.5 MG/5ML syrup  Take 5 mLs by mouth every 6 (six) hours as needed for cough. (Patient not taking: No sig reported)   No facility-administered encounter medications on file as of 11/26/2020.     Review of Systems  Review of Systems  N/a Physical Exam  BP 130/88 (BP Location: Left Arm, Cuff Size: Normal)   Pulse 95   Temp (!) 97.3 F (36.3 C) (Temporal)   Ht 5\' 8"  (1.727 m)   Wt 203 lb 3.2 oz (92.2 kg)   SpO2 99% Comment: RA  BMI 30.90 kg/m   Wt Readings from Last 5 Encounters:  12/24/20 208 lb 9.6 oz (94.6 kg)  12/24/20 210 lb (95.3 kg)  12/03/20 207 lb 3.2 oz (94 kg)  11/26/20 203 lb 3.2 oz (92.2 kg)  11/12/20 205 lb 8 oz (93.2 kg)    BMI Readings from Last 5 Encounters:  12/24/20 32.19 kg/m  12/24/20 32.41 kg/m  12/03/20 31.50 kg/m  11/26/20 30.90 kg/m   11/12/20 31.25 kg/m     Physical Exam General: Well appearing, in NAD Pulm: CTAB, diminished L base CV: RRR, no murmur Abd: ND, BS present  Neuro: Normal gait, no weakness  Assessment & Plan:   Pleural Effusion: High suspicion related to underlying malignancy. Cytology negative but exudative and lymphocytic predominant. No significant re-accumulation based on symptoms. Perhaps treatment is helping.   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 On therapy.   Return if symptoms worsen or fail to improve.   Lanier Clam, MD 01/20/2021   This appointment required 35 minutes of patient care (this includes precharting, chart review, review of results, face-to-face care, etc.).

## 2021-01-21 ENCOUNTER — Inpatient Hospital Stay (HOSPITAL_BASED_OUTPATIENT_CLINIC_OR_DEPARTMENT_OTHER): Payer: 59 | Admitting: Oncology

## 2021-01-21 ENCOUNTER — Inpatient Hospital Stay: Payer: 59

## 2021-01-21 ENCOUNTER — Inpatient Hospital Stay: Payer: 59 | Attending: Oncology

## 2021-01-21 ENCOUNTER — Other Ambulatory Visit: Payer: Self-pay

## 2021-01-21 VITALS — BP 143/82 | HR 79 | Temp 96.7°F | Resp 17 | Ht 67.5 in | Wt 214.5 lb

## 2021-01-21 DIAGNOSIS — C642 Malignant neoplasm of left kidney, except renal pelvis: Secondary | ICD-10-CM

## 2021-01-21 DIAGNOSIS — C7800 Secondary malignant neoplasm of unspecified lung: Secondary | ICD-10-CM | POA: Diagnosis not present

## 2021-01-21 DIAGNOSIS — C7951 Secondary malignant neoplasm of bone: Secondary | ICD-10-CM | POA: Diagnosis present

## 2021-01-21 DIAGNOSIS — Z5112 Encounter for antineoplastic immunotherapy: Secondary | ICD-10-CM | POA: Diagnosis present

## 2021-01-21 DIAGNOSIS — Z79899 Other long term (current) drug therapy: Secondary | ICD-10-CM | POA: Diagnosis not present

## 2021-01-21 LAB — CBC WITH DIFFERENTIAL (CANCER CENTER ONLY)
Abs Immature Granulocytes: 0.05 K/uL (ref 0.00–0.07)
Basophils Absolute: 0 K/uL (ref 0.0–0.1)
Basophils Relative: 0 %
Eosinophils Absolute: 0.4 K/uL (ref 0.0–0.5)
Eosinophils Relative: 4 %
HCT: 42.1 % (ref 39.0–52.0)
Hemoglobin: 13.9 g/dL (ref 13.0–17.0)
Immature Granulocytes: 1 %
Lymphocytes Relative: 18 %
Lymphs Abs: 1.8 K/uL (ref 0.7–4.0)
MCH: 29.4 pg (ref 26.0–34.0)
MCHC: 33 g/dL (ref 30.0–36.0)
MCV: 89 fL (ref 80.0–100.0)
Monocytes Absolute: 0.7 K/uL (ref 0.1–1.0)
Monocytes Relative: 7 %
Neutro Abs: 7 K/uL (ref 1.7–7.7)
Neutrophils Relative %: 70 %
Platelet Count: 281 K/uL (ref 150–400)
RBC: 4.73 MIL/uL (ref 4.22–5.81)
RDW: 14.4 % (ref 11.5–15.5)
WBC Count: 9.9 K/uL (ref 4.0–10.5)
nRBC: 0 % (ref 0.0–0.2)

## 2021-01-21 LAB — CMP (CANCER CENTER ONLY)
ALT: 32 U/L (ref 0–44)
AST: 15 U/L (ref 15–41)
Albumin: 3.7 g/dL (ref 3.5–5.0)
Alkaline Phosphatase: 77 U/L (ref 38–126)
Anion gap: 10 (ref 5–15)
BUN: 22 mg/dL — ABNORMAL HIGH (ref 6–20)
CO2: 23 mmol/L (ref 22–32)
Calcium: 9.2 mg/dL (ref 8.9–10.3)
Chloride: 106 mmol/L (ref 98–111)
Creatinine: 1.16 mg/dL (ref 0.61–1.24)
GFR, Estimated: >60 mL/min (ref >60)
Glucose, Bld: 152 mg/dL — ABNORMAL HIGH (ref 70–99)
Potassium: 3.9 mmol/L (ref 3.5–5.1)
Sodium: 139 mmol/L (ref 135–145)
Total Bilirubin: 0.3 mg/dL (ref 0.3–1.2)
Total Protein: 7.1 g/dL (ref 6.5–8.1)

## 2021-01-21 LAB — TSH: TSH: 3.729 u[IU]/mL (ref 0.320–4.118)

## 2021-01-21 MED ORDER — SODIUM CHLORIDE 0.9 % IV SOLN
480.0000 mg | Freq: Once | INTRAVENOUS | Status: AC
  Administered 2021-01-21: 480 mg via INTRAVENOUS
  Filled 2021-01-21: qty 48

## 2021-01-21 MED ORDER — ACETAMINOPHEN 325 MG PO TABS
ORAL_TABLET | ORAL | Status: AC
  Filled 2021-01-21: qty 2

## 2021-01-21 MED ORDER — SODIUM CHLORIDE 0.9 % IV SOLN
Freq: Once | INTRAVENOUS | Status: DC
  Filled 2021-01-21: qty 250

## 2021-01-21 NOTE — Progress Notes (Signed)
Hematology and Oncology Follow Up Visit  Bob Ewing [id number] 1970-09-16 50 y.o. 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   Ipilimumab 1 mg/kg and nivolumab 3 mg/kg cycle 1 given on October 02, 2020.  Completed cycle 4 of therapy on Dec 03, 2020.  Current therapy: Nivolumab 480 mg every 4 weeks.  He is here for next cycle of therapy.  Interim History: Bob Ewing returns presents today for a follow-up evaluation.  Since the last visit, he continues to report improvement in his overall health and performance status.  He reports no respiratory complaints no shortness of breath, difficulty breathing or cough.  He denies any skin rash or lesions.  He continues to tolerate nivolumab maintenance without any concerns.     Medications: Unchanged on review. Current Outpatient Medications  Medication Sig Dispense Refill   aspirin 81 MG chewable tablet Chew 1 tablet (81 mg total) by mouth daily. 90 tablet 0   atenolol (TENORMIN) 50 MG tablet Take 50 mg by mouth daily.     diphenhydrAMINE (BENADRYL) 25 mg capsule Take 1 capsule (25 mg total) by mouth every 6 (six) hours as needed for up to 5 days for itching (rash). 20 capsule 0   hydrochlorothiazide (HYDRODIURIL) 25 MG tablet Take 1 tablet by mouth every morning.     HYDROcodone-acetaminophen (NORCO) 10-325 MG tablet Take 1 tablet by mouth every 4 (four) hours as needed for moderate pain or severe pain.     metFORMIN (GLUCOPHAGE) 500 MG tablet Take 1 tablet by mouth daily.     phentermine 37.5 MG capsule Take 37.5 mg by mouth every morning.     triamcinolone ointment (KENALOG) 0.5 % APPLY  TOPICALLY TO THE AFFECTED AREA TWICE DAILY 30 g 0   No current facility-administered medications for this visit.     Allergies:  Allergies  Allergen Reactions   Phentermine     Kidney pain      Physical Exam:    Blood pressure (!) 143/82, pulse 79, temperature (!) 96.7 F (35.9 C), temperature source Tympanic, resp. rate 17, height 5' 7.5" (1.715 m), weight 214 lb 8 oz (97.3 kg), SpO2 99 %.   ECOG: 1   General appearance: Comfortable appearing without any discomfort Head: Normocephalic without any trauma Oropharynx: Mucous membranes are moist and pink without any thrush or ulcers. Eyes: Pupils are equal and round reactive to light. Lymph nodes: No cervical, supraclavicular, inguinal or axillary lymphadenopathy.   Heart:regular rate and rhythm.  S1 and S2 without leg edema. Lung: Clear without any rhonchi or wheezes.  No dullness to percussion. Abdomin: Soft, nontender, nondistended with good bowel sounds.  No hepatosplenomegaly. Musculoskeletal: No joint deformity or effusion.  Full range of motion noted. Neurological: No deficits noted on motor, sensory and deep tendon reflex exam. Skin: No petechial rash or dryness.  Appeared moist.         Lab Results: Lab Results  Component Value Date   WBC 7.4 12/24/2020   HGB 14.3 12/24/2020   HCT 44.2 12/24/2020   MCV 89.8 12/24/2020   PLT 301 12/24/2020     Chemistry      Component Value Date/Time  NA 139 12/24/2020 1242   K 3.7 12/24/2020 1242   CL 104 12/24/2020 1242   CO2 26 12/24/2020 1242   BUN 22 (H) 12/24/2020 1242   CREATININE 1.24 12/24/2020 1242      Component Value Date/Time   CALCIUM 9.8 12/24/2020 1242   ALKPHOS 92 12/24/2020 1242   AST 24 12/24/2020 1242   ALT 52 (H) 12/24/2020 1242   BILITOT 0.3 12/24/2020 1242        Impression and Plan:  50 year old man with:   1.  Stage IV clear-cell renal cell carcinoma with rhabdoid features with pulmonary and subcutaneous nodules diagnosed in  February 2022.   He is currently on nivolumab maintenance therapy after achieving excellent response to combination immunotherapy.  Risks and benefits of continuing this treatment long-term were discussed.  Complications that include nausea, fatigue and immune mediated issues were discussed.  Oral targeted therapy would be used as a salvage option in the future if he developed progression of disease.  He is agreeable to continue at this time.  2.  IV access: Port-A-Cath has been deferred and peripheral veins are currently in use.   3.  Antiemetics: No nausea or vomiting reported currently.  Compazine is available to him.   4.  CNS surveillance: We will repeat MRI in March 2023.  This can be done sooner if you develop any symptoms.   5.  Pulmonary considerations: No respiratory complaints but he continues to smoke.  I continue to advise against smoking and discussed smoking cessation strategies.   6.  Goals of care and prognosis: Therapy remains palliative although aggressive measures are warranted given his excellent response and and age.  7.  Immune mediated complications: I continue to educate him about potential complications including pneumonitis, colitis and thyroid disease.   8.  Follow-up: In 1 month for repeat follow-up.   30  minutes were dedicated to this encounter.  The time was spent on reviewing his disease status, salvage therapy options for the future and outlining future plan of care.     Bob Button, MD 6/23/20227:59 AM

## 2021-01-21 NOTE — Patient Instructions (Signed)
[facility]    Discharge Instructions: Thank you for choosing [facility] to provide your oncology and hematology care.   If you have a lab appointment with the [facility], please go directly to the [facility] and check in at the registration area.   Wear comfortable clothing and clothing appropriate for easy access to any Portacath or PICC line.   We strive to give you quality time with your provider. You may need to reschedule your appointment if you arrive late (15 or more minutes).  Arriving late affects you and other patients whose appointments are after yours.  Also, if you miss three or more appointments without notifying the office, you may be dismissed from the clinic at the provider's discretion.      For prescription refill requests, have your pharmacy contact our office and allow 72 hours for refills to be completed.    Today you received the following chemotherapy and/or immunotherapy agents: nivolumab.      To help prevent nausea and vomiting after your treatment, we encourage you to take your nausea medication as directed.  BELOW ARE SYMPTOMS THAT SHOULD BE REPORTED IMMEDIATELY: *FEVER GREATER THAN 100.4 F (38 C) OR HIGHER *CHILLS OR SWEATING *NAUSEA AND VOMITING THAT IS NOT CONTROLLED WITH YOUR NAUSEA MEDICATION *UNUSUAL SHORTNESS OF BREATH *UNUSUAL BRUISING OR BLEEDING *URINARY PROBLEMS (pain or burning when urinating, or frequent urination) *BOWEL PROBLEMS (unusual diarrhea, constipation, pain near the anus) TENDERNESS IN MOUTH AND THROAT WITH OR WITHOUT PRESENCE OF ULCERS (sore throat, sores in mouth, or a toothache) UNUSUAL RASH, SWELLING OR PAIN  UNUSUAL VAGINAL DISCHARGE OR ITCHING   Items with * indicate a potential emergency and should be followed up as soon as possible or go to the Emergency Department if any problems should occur.  Please show the CHEMOTHERAPY ALERT CARD or IMMUNOTHERAPY ALERT CARD at  check-in to the Emergency Department and triage nurse.  Should you have questions after your visit or need to cancel or reschedule your appointment, please contact [facility]  Dept: [phone]  and follow the prompts.  Office hours are 8:00 a.m. to 4:30 p.m. Monday - Friday. Please note that voicemails left after 4:00 p.m. may not be returned until the following business day.  We are closed weekends and major holidays. You have access to a nurse at all times for urgent questions. Please call the main number to the clinic Dept: [phone] and follow the prompts.   For any non-urgent questions, you may also contact your provider using MyChart. We now offer e-Visits for anyone 79 and older to request care online for non-urgent symptoms. For details visit [email or web address].   Also download the MyChart app! Go to the app store, search "MyChart", open the app, select [facility], and log in with your MyChart username and password.  Due to Covid, a mask is required upon entering the hospital/clinic. If you do not have a mask, one will be given to you upon arrival. For doctor visits, patients may have 1 support person aged 25 or older with them. For treatment visits, patients cannot have anyone with them due to current Covid guidelines and our immunocompromised population.   Zoledronic Acid Injection (Hypercalcemia, Oncology) What is this medicine? ZOLEDRONIC ACID (ZOE le dron ik AS id) slows calcium loss from bones. It high calcium levels in the blood from some kinds of cancer. It may be used in other people at risk for bone loss. This  medicine may be used for other purposes; ask your health care provider or pharmacist if you have questions. COMMON BRAND NAME(S): Zometa What should I tell my health care provider before I take this medicine? They need to know if you have any of these conditions: cancer dehydration dental disease kidney disease liver disease low  levels of calcium in the blood lung or breathing disease (asthma) receiving steroids like dexamethasone or prednisone an unusual or allergic reaction to zoledronic acid, other medicines, foods, dyes, or preservatives pregnant or trying to get pregnant breast-feeding How should I use this medicine? This drug is injected into a vein. It is given by a health care provider in a hospital or clinic setting. Talk to your health care provider about the use of this drug in children. Special care may be needed. Overdosage: If you think you have taken too much of this medicine contact a poison control center or emergency room at once. NOTE: This medicine is only for you. Do not share this medicine with others. What if I miss a dose? Keep appointments for follow-up doses. It is important not to miss your dose. Call your health care provider if you are unable to keep an appointment. What may interact with this medicine? certain antibiotics given by injection NSAIDs, medicines for pain and inflammation, like ibuprofen or naproxen some diuretics like bumetanide, furosemide teriparatide thalidomide This list may not describe all possible interactions. Give your health care provider a list of all the medicines, herbs, non-prescription drugs, or dietary supplements you use. Also tell them if you smoke, drink alcohol, or use illegal drugs. Some items may interact with your medicine. What should I watch for while using this medicine? Visit your health care provider for regular checks on your progress. It may be some time before you see the benefit from this drug. Some people who take this drug have severe bone, joint, or muscle pain. This drug may also increase your risk for jaw problems or a broken thigh bone. Tell your health care provider right away if you have severe pain in your jaw, bones, joints, or muscles. Tell you health care provider if you have any pain that does not go away or that gets worse. Tell  your dentist and dental surgeon that you are taking this drug. You should not have major dental surgery while on this drug. See your dentist to have a dental exam and fix any dental problems before starting this drug. Take good care of your teeth while on this drug. Make sure you see your dentist for regular follow-up appointments. You should make sure you get enough calcium and vitamin D while you are taking this drug. Discuss the foods you eat and the vitamins you take with your health care provider. Check with your health care provider if you have severe diarrhea, nausea, and vomiting, or if you sweat a lot. The loss of too much body fluid may make it dangerous for you to take this drug. You may need blood work done while you are taking this drug. Do not become pregnant while taking this drug. Women should inform their health care provider if they wish to become pregnant or think they might be pregnant. There is potential for serious harm to an unborn child. Talk to your health care provider for more information. What side effects may I notice from receiving this medicine? Side effects that you should report to your doctor or health care provider as soon as possible: allergic reactions (skin rash,  itching or hives; swelling of the face, lips, or tongue) bone pain infection (fever, chills, cough, sore throat, pain or trouble passing urine) jaw pain, especially after dental work joint pain kidney injury (trouble passing urine or change in the amount of urine) low blood pressure (dizziness; feeling faint or lightheaded, falls; unusually weak or tired) low calcium levels (fast heartbeat; muscle cramps or pain; pain, tingling, or numbness in the hands or feet; seizures) low magnesium levels (fast, irregular heartbeat; muscle cramp or pain; muscle weakness; tremors; seizures) low red blood cell counts (trouble breathing; feeling faint; lightheaded, falls; unusually weak or tired) muscle pain redness,  blistering, peeling, or loosening of the skin, including inside the mouth severe diarrhea swelling of the ankles, feet, hands trouble breathing Side effects that usually do not require medical attention (report to your doctor or health care provider if they continue or are bothersome): anxious constipation coughing depressed mood eye irritation, itching, or pain fever general ill feeling or flu-like symptoms nausea pain, redness, or irritation at site where injected trouble sleeping This list may not describe all possible side effects. Call your doctor for medical advice about side effects. You may report side effects to FDA at 1-800-FDA-1088. Where should I keep my medicine? This drug is given in a hospital or clinic. It will not be stored at home. NOTE: This sheet is a summary. It may not cover all possible information. If you have questions about this medicine, talk to your doctor, pharmacist, or health care provider.  2021 Elsevier/Gold Standard (2019-05-02 09:13:00)

## 2021-02-19 ENCOUNTER — Other Ambulatory Visit: Payer: Self-pay

## 2021-02-19 ENCOUNTER — Inpatient Hospital Stay: Payer: 59 | Attending: Medical

## 2021-02-19 ENCOUNTER — Inpatient Hospital Stay: Payer: 59

## 2021-02-19 ENCOUNTER — Inpatient Hospital Stay (HOSPITAL_BASED_OUTPATIENT_CLINIC_OR_DEPARTMENT_OTHER): Payer: 59 | Admitting: Oncology

## 2021-02-19 VITALS — BP 129/69 | HR 77 | Temp 99.0°F | Resp 13 | Ht 67.5 in | Wt 216.2 lb

## 2021-02-19 DIAGNOSIS — Z7982 Long term (current) use of aspirin: Secondary | ICD-10-CM | POA: Insufficient documentation

## 2021-02-19 DIAGNOSIS — C642 Malignant neoplasm of left kidney, except renal pelvis: Secondary | ICD-10-CM

## 2021-02-19 DIAGNOSIS — C7951 Secondary malignant neoplasm of bone: Secondary | ICD-10-CM | POA: Diagnosis present

## 2021-02-19 DIAGNOSIS — Z7984 Long term (current) use of oral hypoglycemic drugs: Secondary | ICD-10-CM | POA: Insufficient documentation

## 2021-02-19 DIAGNOSIS — Z5112 Encounter for antineoplastic immunotherapy: Secondary | ICD-10-CM | POA: Insufficient documentation

## 2021-02-19 DIAGNOSIS — Z79899 Other long term (current) drug therapy: Secondary | ICD-10-CM | POA: Diagnosis not present

## 2021-02-19 LAB — CBC WITH DIFFERENTIAL (CANCER CENTER ONLY)
Abs Immature Granulocytes: 0.04 10*3/uL (ref 0.00–0.07)
Basophils Absolute: 0.1 10*3/uL (ref 0.0–0.1)
Basophils Relative: 1 %
Eosinophils Absolute: 1.3 10*3/uL — ABNORMAL HIGH (ref 0.0–0.5)
Eosinophils Relative: 15 %
HCT: 42.8 % (ref 39.0–52.0)
Hemoglobin: 14.2 g/dL (ref 13.0–17.0)
Immature Granulocytes: 0 %
Lymphocytes Relative: 19 %
Lymphs Abs: 1.7 10*3/uL (ref 0.7–4.0)
MCH: 30.3 pg (ref 26.0–34.0)
MCHC: 33.2 g/dL (ref 30.0–36.0)
MCV: 91.3 fL (ref 80.0–100.0)
Monocytes Absolute: 0.4 10*3/uL (ref 0.1–1.0)
Monocytes Relative: 5 %
Neutro Abs: 5.4 10*3/uL (ref 1.7–7.7)
Neutrophils Relative %: 60 %
Platelet Count: 284 10*3/uL (ref 150–400)
RBC: 4.69 MIL/uL (ref 4.22–5.81)
RDW: 14.2 % (ref 11.5–15.5)
WBC Count: 9 10*3/uL (ref 4.0–10.5)
nRBC: 0 % (ref 0.0–0.2)

## 2021-02-19 LAB — CMP (CANCER CENTER ONLY)
ALT: 37 U/L (ref 0–44)
AST: 17 U/L (ref 15–41)
Albumin: 3.7 g/dL (ref 3.5–5.0)
Alkaline Phosphatase: 82 U/L (ref 38–126)
Anion gap: 9 (ref 5–15)
BUN: 23 mg/dL — ABNORMAL HIGH (ref 6–20)
CO2: 23 mmol/L (ref 22–32)
Calcium: 8.9 mg/dL (ref 8.9–10.3)
Chloride: 107 mmol/L (ref 98–111)
Creatinine: 1.15 mg/dL (ref 0.61–1.24)
GFR, Estimated: >60 mL/min (ref >60)
Glucose, Bld: 178 mg/dL — ABNORMAL HIGH (ref 70–99)
Potassium: 4.1 mmol/L (ref 3.5–5.1)
Sodium: 139 mmol/L (ref 135–145)
Total Bilirubin: 0.3 mg/dL (ref 0.3–1.2)
Total Protein: 7.2 g/dL (ref 6.5–8.1)

## 2021-02-19 LAB — TSH: TSH: 2.402 u[IU]/mL (ref 0.320–4.118)

## 2021-02-19 MED ORDER — ACETAMINOPHEN 325 MG PO TABS
ORAL_TABLET | ORAL | Status: AC
  Filled 2021-02-19: qty 2

## 2021-02-19 MED ORDER — ACETAMINOPHEN 325 MG PO TABS
650.0000 mg | ORAL_TABLET | Freq: Once | ORAL | Status: AC
  Administered 2021-02-19: 650 mg via ORAL

## 2021-02-19 MED ORDER — SODIUM CHLORIDE 0.9 % IV SOLN
Freq: Once | INTRAVENOUS | Status: AC
  Filled 2021-02-19: qty 250

## 2021-02-19 MED ORDER — SODIUM CHLORIDE 0.9 % IV SOLN
480.0000 mg | Freq: Once | INTRAVENOUS | Status: AC
  Administered 2021-02-19: 480 mg via INTRAVENOUS
  Filled 2021-02-19: qty 48

## 2021-02-19 NOTE — Patient Instructions (Signed)
[facility]  Discharge Instructions: ?Thank you for choosing [facility] to provide your oncology and hematology care.  ? ?If you have a lab appointment with the Cancer Center, please go directly to the Cancer Center and check in at the registration area. ?  ?Wear comfortable clothing and clothing appropriate for easy access to any Portacath or PICC line.  ? ?We strive to give you quality time with your provider. You may need to reschedule your appointment if you arrive late (15 or more minutes).  Arriving late affects you and other patients whose appointments are after yours.  Also, if you miss three or more appointments without notifying the office, you may be dismissed from the clinic at the provider?s discretion.    ?  ?For prescription refill requests, have your pharmacy contact our office and allow 72 hours for refills to be completed.   ? ?Today you received the following chemotherapy and/or immunotherapy agents Opdivo    ?  ?To help prevent nausea and vomiting after your treatment, we encourage you to take your nausea medication as directed. ? ?BELOW ARE SYMPTOMS THAT SHOULD BE REPORTED IMMEDIATELY: ?*FEVER GREATER THAN 100.4 F (38 ?C) OR HIGHER ?*CHILLS OR SWEATING ?*NAUSEA AND VOMITING THAT IS NOT CONTROLLED WITH YOUR NAUSEA MEDICATION ?*UNUSUAL SHORTNESS OF BREATH ?*UNUSUAL BRUISING OR BLEEDING ?*URINARY PROBLEMS (pain or burning when urinating, or frequent urination) ?*BOWEL PROBLEMS (unusual diarrhea, constipation, pain near the anus) ?TENDERNESS IN MOUTH AND THROAT WITH OR WITHOUT PRESENCE OF ULCERS (sore throat, sores in mouth, or a toothache) ?UNUSUAL RASH, SWELLING OR PAIN  ?UNUSUAL VAGINAL DISCHARGE OR ITCHING  ? ?Items with * indicate a potential emergency and should be followed up as soon as possible or go to the Emergency Department if any problems should occur. ? ?Please show the CHEMOTHERAPY ALERT CARD or IMMUNOTHERAPY ALERT CARD at check-in to the  Emergency Department and triage nurse. ? ?Should you have questions after your visit or need to cancel or reschedule your appointment, please contact [facility]  Dept: [phone]  and follow the prompts.  Office hours are 8:00 a.m. to 4:30 p.m. Monday - Friday. Please note that voicemails left after 4:00 p.m. may not be returned until the following business day.  We are closed weekends and major holidays. You have access to a nurse at all times for urgent questions. Please call the main number to the clinic Dept: [phone] and follow the prompts. ? ? ?For any non-urgent questions, you may also contact your provider using MyChart. We now offer e-Visits for anyone 18 and older to request care online for non-urgent symptoms. For details visit [email or web address]. ?  ?Also download the MyChart app! Go to the app store, search "MyChart", open the app, select [facility], and log in with your MyChart username and password. ? ?Due to Covid, a mask is required upon entering the hospital/clinic. If you do not have a mask, one will be given to you upon arrival. For doctor visits, patients may have 1 support person aged 18 or older with them. For treatment visits, patients cannot have anyone with them due to current Covid guidelines and our immunocompromised population.  ? ?

## 2021-02-19 NOTE — Progress Notes (Signed)
Hematology and Oncology Follow Up Visit  Seaton Oakley [id number] 25-Nov-1970 50 y.o. 02/19/2021 12:05 PM [name], MDShadad, [name], MD   Principle Diagnosis: 36 year old man with kidney cancer diagnosed in February 2022.  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   Ipilimumab 1 mg/kg and nivolumab 3 mg/kg cycle 1 given on October 02, 2020.  Completed cycle 4 of therapy on Dec 03, 2020.  Current therapy: Nivolumab 480 mg every 4 weeks.  He is here for next cycle of therapy.  Interim History: Mr. Fasciano is here for a follow-up visit.  Since last visit, he reports no major changes in his health.  He continues to tolerate nivolumab without any major complaints.  He denies any nausea, vomiting or abdominal pain.  He denies any hospitalization or illnesses.  His performance status and quality of life remained excellent.  He does report occasional episodes of hypoglycemia and takes Glucophage intermittently.     Medications: Updated and reviewed. Current Outpatient Medications  Medication Sig Dispense Refill   aspirin 81 MG chewable tablet Chew 1 tablet (81 mg total) by mouth daily. 90 tablet 0   atenolol (TENORMIN) 50 MG tablet Take 50 mg by mouth daily.     diphenhydrAMINE (BENADRYL) 25 mg capsule Take 1 capsule (25 mg total) by mouth every 6 (six) hours as needed for up to 5 days for itching (rash). 20 capsule 0   hydrochlorothiazide (HYDRODIURIL) 25 MG tablet Take 1 tablet by mouth every morning.     HYDROcodone-acetaminophen (NORCO) 10-325 MG tablet Take 1 tablet by mouth every 4 (four) hours as needed for moderate pain or severe pain.     metFORMIN (GLUCOPHAGE) 500 MG tablet Take 1 tablet by mouth daily.      phentermine 37.5 MG capsule Take 37.5 mg by mouth every morning.     triamcinolone ointment (KENALOG) 0.5 % APPLY TOPICALLY TO THE AFFECTED AREA TWICE DAILY 30 g 0   No current facility-administered medications for this visit.     Allergies:  Allergies  Allergen Reactions   Phentermine     Kidney pain      Physical Exam:    Blood pressure 129/69, pulse 77, temperature 99 F (37.2 C), temperature source Oral, resp. rate 13, height 5' 7.5" (1.715 m), weight 216 lb 3.2 oz (98.1 kg), SpO2 100 %.   ECOG: 1    General appearance: Alert, awake without any distress. Head: Atraumatic without abnormalities Oropharynx: Without any thrush or ulcers. Eyes: No scleral icterus. Lymph nodes: No lymphadenopathy noted in the cervical, supraclavicular, or axillary nodes Heart:regular rate and rhythm, without any murmurs or gallops.   Lung: Clear to auscultation without any rhonchi, wheezes or dullness to percussion. Abdomin: Soft, nontender without any shifting dullness or ascites. Musculoskeletal: No clubbing or cyanosis. Neurological: No motor or sensory deficits. Skin: No rashes or lesions.         Lab Results: Lab Results  Component Value Date   WBC 9.0 02/19/2021   HGB 14.2 02/19/2021   HCT 42.8 02/19/2021   MCV 91.3 02/19/2021   PLT 284 02/19/2021     Chemistry      Component Value Date/Time   NA 139 01/21/2021 0816   K 3.9  01/21/2021 0816   CL 106 01/21/2021 0816   CO2 23 01/21/2021 0816   BUN 22 (H) 01/21/2021 0816   CREATININE 1.16 01/21/2021 0816      Component Value Date/Time   CALCIUM 9.2 01/21/2021 0816   ALKPHOS 77 01/21/2021 0816   AST 15 01/21/2021 0816   ALT 32 01/21/2021 0816   BILITOT 0.3 01/21/2021 0816        Impression and Plan:  50 year old man with:   1.  Kidney cancer diagnosed in February 2022.  He was found to have stage IV clear-cell renal cell and currently experiencing excellent clinical benefit and response to  immunotherapy.   Risks and benefits of continuing nivolumab maintenance were discussed at this time.  Complications including immune mediated issues, nausea, fatigue among others were discussed.  Plan is to update his staging scans in September 2022.  Different salvage therapy options including oral targeted therapy with cabozantinib could be used as an option for salvage therapy.  The duration of immunotherapy would be at least a year possibly longer pending his response.  2.  IV access: Peripheral veins are currently in use at this time.   3.  Antiemetics: Compazine is available to him.  No nausea or vomiting reported.   4.  CNS surveillance: He is up-to-date on the surveillance and we will repeat it in March 2023.  This will be done sooner if you develop any symptoms.      5.  Goals of care and prognosis: His disease is incurable although aggressive measures are warranted given his excellent response and age.  6.  Hyperglycemia: I recommended follow-up with his primary care physician regarding this issue.  7.  Immune mediated complications: He has not experienced any at this time.  We will continue to educate him about potential pneumonitis, colitis and thyroid disease.   8.  Follow-up: In 4 weeks for repeat follow-up.   30  minutes were spent on this visit.  The time was dedicated to reviewing his disease status, treatment choices and outlining future plan of care discussion.     Zola Button, MD 7/22/202212:05 PM

## 2021-03-19 ENCOUNTER — Other Ambulatory Visit: Payer: Self-pay

## 2021-03-19 ENCOUNTER — Inpatient Hospital Stay (HOSPITAL_BASED_OUTPATIENT_CLINIC_OR_DEPARTMENT_OTHER): Payer: 59 | Admitting: Oncology

## 2021-03-19 ENCOUNTER — Inpatient Hospital Stay: Payer: 59

## 2021-03-19 ENCOUNTER — Inpatient Hospital Stay: Payer: 59 | Attending: Medical

## 2021-03-19 VITALS — BP 132/77 | HR 77 | Temp 98.7°F | Resp 19 | Ht 67.5 in | Wt 215.6 lb

## 2021-03-19 DIAGNOSIS — C642 Malignant neoplasm of left kidney, except renal pelvis: Secondary | ICD-10-CM

## 2021-03-19 DIAGNOSIS — C7951 Secondary malignant neoplasm of bone: Secondary | ICD-10-CM | POA: Insufficient documentation

## 2021-03-19 DIAGNOSIS — Z79899 Other long term (current) drug therapy: Secondary | ICD-10-CM | POA: Insufficient documentation

## 2021-03-19 DIAGNOSIS — R739 Hyperglycemia, unspecified: Secondary | ICD-10-CM | POA: Insufficient documentation

## 2021-03-19 DIAGNOSIS — Z5112 Encounter for antineoplastic immunotherapy: Secondary | ICD-10-CM | POA: Diagnosis not present

## 2021-03-19 DIAGNOSIS — Z7982 Long term (current) use of aspirin: Secondary | ICD-10-CM | POA: Insufficient documentation

## 2021-03-19 DIAGNOSIS — Z7984 Long term (current) use of oral hypoglycemic drugs: Secondary | ICD-10-CM | POA: Insufficient documentation

## 2021-03-19 LAB — CMP (CANCER CENTER ONLY)
ALT: 38 U/L (ref 0–44)
AST: 18 U/L (ref 15–41)
Albumin: 4.1 g/dL (ref 3.5–5.0)
Alkaline Phosphatase: 76 U/L (ref 38–126)
Anion gap: 12 (ref 5–15)
BUN: 24 mg/dL — ABNORMAL HIGH (ref 6–20)
CO2: 24 mmol/L (ref 22–32)
Calcium: 9.6 mg/dL (ref 8.9–10.3)
Chloride: 103 mmol/L (ref 98–111)
Creatinine: 1.38 mg/dL — ABNORMAL HIGH (ref 0.61–1.24)
GFR, Estimated: >60 mL/min (ref >60)
Glucose, Bld: 160 mg/dL — ABNORMAL HIGH (ref 70–99)
Potassium: 4.1 mmol/L (ref 3.5–5.1)
Sodium: 139 mmol/L (ref 135–145)
Total Bilirubin: 0.4 mg/dL (ref 0.3–1.2)
Total Protein: 7.3 g/dL (ref 6.5–8.1)

## 2021-03-19 LAB — CBC WITH DIFFERENTIAL (CANCER CENTER ONLY)
Abs Immature Granulocytes: 0.06 10*3/uL (ref 0.00–0.07)
Basophils Absolute: 0.1 10*3/uL (ref 0.0–0.1)
Basophils Relative: 1 %
Eosinophils Absolute: 0.4 10*3/uL (ref 0.0–0.5)
Eosinophils Relative: 5 %
HCT: 43.8 % (ref 39.0–52.0)
Hemoglobin: 14.8 g/dL (ref 13.0–17.0)
Immature Granulocytes: 1 %
Lymphocytes Relative: 17 %
Lymphs Abs: 1.6 10*3/uL (ref 0.7–4.0)
MCH: 30.8 pg (ref 26.0–34.0)
MCHC: 33.8 g/dL (ref 30.0–36.0)
MCV: 91.3 fL (ref 80.0–100.0)
Monocytes Absolute: 0.6 10*3/uL (ref 0.1–1.0)
Monocytes Relative: 6 %
Neutro Abs: 6.5 10*3/uL (ref 1.7–7.7)
Neutrophils Relative %: 70 %
Platelet Count: 314 10*3/uL (ref 150–400)
RBC: 4.8 MIL/uL (ref 4.22–5.81)
RDW: 13.2 % (ref 11.5–15.5)
WBC Count: 9.2 10*3/uL (ref 4.0–10.5)
nRBC: 0 % (ref 0.0–0.2)

## 2021-03-19 LAB — TSH: TSH: 2.018 u[IU]/mL (ref 0.320–4.118)

## 2021-03-19 MED ORDER — ACETAMINOPHEN 325 MG PO TABS
650.0000 mg | ORAL_TABLET | Freq: Once | ORAL | Status: AC
  Administered 2021-03-19: 650 mg via ORAL
  Filled 2021-03-19: qty 2

## 2021-03-19 MED ORDER — SODIUM CHLORIDE 0.9 % IV SOLN: Freq: Once | INTRAVENOUS | Status: AC

## 2021-03-19 MED ORDER — SODIUM CHLORIDE 0.9 % IV SOLN
480.0000 mg | Freq: Once | INTRAVENOUS | Status: AC
  Administered 2021-03-19: 480 mg via INTRAVENOUS
  Filled 2021-03-19: qty 48

## 2021-03-19 NOTE — Progress Notes (Signed)
Per Dr. Alen Blew, hold zoledronic acid today.

## 2021-03-19 NOTE — Progress Notes (Signed)
Hematology and Oncology Follow Up Visit  Bob Ewing [id number] 1970/09/03 50 y.o. 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   Ipilimumab 1 mg/kg and nivolumab 3 mg/kg cycle 1 given on October 02, 2020.  Completed cycle 4 of therapy on Dec 03, 2020.  Current therapy: Nivolumab 480 mg every 4 weeks.  He is here for next cycle of therapy.  Interim History: Bob Ewing presents today for repeat evaluation.  Since the last visit, he reports no major changes in his health.  He has resumed work-related duties without any issues.  He denies any nausea, vomiting or abdominal pain.  He denies any shortness of breath or difficulty breathing.  His performance status quality of life continues to improve and close to normal.     Medications: Reviewed and updated. Current Outpatient Medications  Medication Sig Dispense Refill   aspirin 81 MG chewable tablet Chew 1 tablet (81 mg total) by mouth daily. 90 tablet 0   atenolol (TENORMIN) 50 MG tablet Take 50 mg by mouth daily.     diphenhydrAMINE (BENADRYL) 25 mg capsule Take 1 capsule (25 mg total) by mouth every 6 (six) hours as needed for up to 5 days for itching (rash). 20 capsule 0   hydrochlorothiazide (HYDRODIURIL) 25 MG tablet Take 1 tablet by mouth every morning.     HYDROcodone-acetaminophen (NORCO) 10-325 MG tablet Take 1 tablet by mouth every 4 (four) hours as needed for moderate pain or severe pain.     metFORMIN (GLUCOPHAGE) 500 MG tablet Take 1 tablet by mouth daily.     phentermine 37.5 MG capsule Take 37.5 mg by mouth every morning.     triamcinolone ointment (KENALOG) 0.5 % APPLY TOPICALLY TO THE AFFECTED AREA  TWICE DAILY 30 g 0   No current facility-administered medications for this visit.     Allergies:  Allergies  Allergen Reactions   Phentermine     Kidney pain      Physical Exam:    Blood pressure 132/77, pulse 77, temperature 98.7 F (37.1 C), temperature source Oral, resp. rate 19, height 5' 7.5" (1.715 m), weight 215 lb 9.6 oz (97.8 kg), SpO2 100 %.    ECOG: 1    General appearance: Comfortable appearing without any discomfort Head: Normocephalic without any trauma Oropharynx: Mucous membranes are moist and pink without any thrush or ulcers. Eyes: Pupils are equal and round reactive to light. Lymph nodes: No cervical, supraclavicular, inguinal or axillary lymphadenopathy.   Heart:regular rate and rhythm.  S1 and S2 without leg edema. Lung: Clear without any rhonchi or wheezes.  No dullness to percussion. Abdomin: Soft, nontender, nondistended with good bowel sounds.  No hepatosplenomegaly. Musculoskeletal: No joint deformity or effusion.  Full range of motion noted. Neurological: No deficits noted on motor, sensory and deep tendon reflex exam. Skin: No petechial rash or dryness.  Appeared moist.           Lab Results: Lab Results  Component Value Date   WBC 9.0 02/19/2021   HGB 14.2 02/19/2021   HCT 42.8 02/19/2021   MCV 91.3 02/19/2021   PLT 284 02/19/2021     Chemistry      Component Value Date/Time  NA 139 02/19/2021 1146   K 4.1 02/19/2021 1146   CL 107 02/19/2021 1146   CO2 23 02/19/2021 1146   BUN 23 (H) 02/19/2021 1146   CREATININE 1.15 02/19/2021 1146      Component Value Date/Time   CALCIUM 8.9 02/19/2021 1146   ALKPHOS 82 02/19/2021 1146   AST 17 02/19/2021 1146   ALT 37 02/19/2021 1146   BILITOT 0.3 02/19/2021 1146        Impression and Plan:  50 year old man with:   1.  Sage IV clear-cell renal cell carcinoma with rhabdoid features with pulmonary and subcutaneous involvement.   He continues to be on maintenance  nivolumab after excellent response to immunotherapy with very little residual disease.  Risks and benefits of continuing this treatment were discussed at this time.  Potential complications were reiterated including immune mediated issues, GI toxicity among others.  The plan is to update his staging scans in October 2022.  2.  IV access: No issues with peripheral veins noted at this time.   3.  Antiemetics: No nausea or vomiting reported at this time.  Compazine is available to him.   4.  CNS surveillance: This will be repeated in March 2023.  No evidence of metastatic disease on previous imaging studies.      5.  Goals of care and prognosis: Aggressive measures are warranted given his young age and excellent response to therapy.  6.  Hyperglycemia: Manageable at this time and continues to follow with primary care physician regarding this issue.  7.  Immune mediated complications: I continue to educate him about potential complication related pneumonitis, colitis and thyroid disease.   8.  Follow-up: He will return in 1 month for the next cycle of therapy.   30  minutes were dedicated to this encounter.  The time was spent on reviewing laboratory data, disease status update, addressing complications related to cancer and cancer therapy.     Zola Button, MD 8/19/20228:17 AM

## 2021-03-19 NOTE — Patient Instructions (Signed)
[facility]  ? Discharge Instructions: ?Thank you for choosing [facility] to provide your oncology and hematology care.  ? ?If you have a lab appointment with the Cancer Center, please go directly to the Cancer Center and check in at the registration area. ?  ?Wear comfortable clothing and clothing appropriate for easy access to any Portacath or PICC line.  ? ?We strive to give you quality time with your provider. You may need to reschedule your appointment if you arrive late (15 or more minutes).  Arriving late affects you and other patients whose appointments are after yours.  Also, if you miss three or more appointments without notifying the office, you may be dismissed from the clinic at the provider?s discretion.    ?  ?For prescription refill requests, have your pharmacy contact our office and allow 72 hours for refills to be completed.   ? ?Today you received the following chemotherapy and/or immunotherapy agents: nivolumab    ?  ?To help prevent nausea and vomiting after your treatment, we encourage you to take your nausea medication as directed. ? ?BELOW ARE SYMPTOMS THAT SHOULD BE REPORTED IMMEDIATELY: ?*FEVER GREATER THAN 100.4 F (38 ?C) OR HIGHER ?*CHILLS OR SWEATING ?*NAUSEA AND VOMITING THAT IS NOT CONTROLLED WITH YOUR NAUSEA MEDICATION ?*UNUSUAL SHORTNESS OF BREATH ?*UNUSUAL BRUISING OR BLEEDING ?*URINARY PROBLEMS (pain or burning when urinating, or frequent urination) ?*BOWEL PROBLEMS (unusual diarrhea, constipation, pain near the anus) ?TENDERNESS IN MOUTH AND THROAT WITH OR WITHOUT PRESENCE OF ULCERS (sore throat, sores in mouth, or a toothache) ?UNUSUAL RASH, SWELLING OR PAIN  ?UNUSUAL VAGINAL DISCHARGE OR ITCHING  ? ?Items with * indicate a potential emergency and should be followed up as soon as possible or go to the Emergency Department if any problems should occur. ? ?Please show the CHEMOTHERAPY ALERT CARD or IMMUNOTHERAPY ALERT CARD at check-in  to the Emergency Department and triage nurse. ? ?Should you have questions after your visit or need to cancel or reschedule your appointment, please contact [facility]  Dept: [phone]  and follow the prompts.  Office hours are 8:00 a.m. to 4:30 p.m. Monday - Friday. Please note that voicemails left after 4:00 p.m. may not be returned until the following business day.  We are closed weekends and major holidays. You have access to a nurse at all times for urgent questions. Please call the main number to the clinic Dept: [phone] and follow the prompts. ? ? ?For any non-urgent questions, you may also contact your provider using MyChart. We now offer e-Visits for anyone 18 and older to request care online for non-urgent symptoms. For details visit [email or web address]. ?  ?Also download the MyChart app! Go to the app store, search "MyChart", open the app, select , and log in with your MyChart username and password. ? ?Due to Covid, a mask is required upon entering the hospital/clinic. If you do not have a mask, one will be given to you upon arrival. For doctor visits, patients may have 1 support person aged 18 or older with them. For treatment visits, patients cannot have anyone with them due to current Covid guidelines and our immunocompromised population.  ? ?

## 2021-04-02 DIAGNOSIS — E785 Hyperlipidemia, unspecified: Secondary | ICD-10-CM | POA: Insufficient documentation

## 2021-04-16 ENCOUNTER — Inpatient Hospital Stay (HOSPITAL_BASED_OUTPATIENT_CLINIC_OR_DEPARTMENT_OTHER): Payer: 59 | Admitting: Oncology

## 2021-04-16 ENCOUNTER — Other Ambulatory Visit: Payer: Self-pay

## 2021-04-16 ENCOUNTER — Inpatient Hospital Stay: Payer: 59

## 2021-04-16 ENCOUNTER — Inpatient Hospital Stay: Payer: 59 | Attending: Medical

## 2021-04-16 VITALS — BP 125/82 | HR 75 | Temp 98.2°F | Resp 18 | Ht 67.5 in | Wt 218.0 lb

## 2021-04-16 DIAGNOSIS — C7951 Secondary malignant neoplasm of bone: Secondary | ICD-10-CM | POA: Diagnosis present

## 2021-04-16 DIAGNOSIS — Z79899 Other long term (current) drug therapy: Secondary | ICD-10-CM | POA: Diagnosis not present

## 2021-04-16 DIAGNOSIS — Z7984 Long term (current) use of oral hypoglycemic drugs: Secondary | ICD-10-CM | POA: Diagnosis not present

## 2021-04-16 DIAGNOSIS — C642 Malignant neoplasm of left kidney, except renal pelvis: Secondary | ICD-10-CM | POA: Insufficient documentation

## 2021-04-16 DIAGNOSIS — Z5112 Encounter for antineoplastic immunotherapy: Secondary | ICD-10-CM | POA: Diagnosis not present

## 2021-04-16 LAB — CBC WITH DIFFERENTIAL (CANCER CENTER ONLY)
Abs Immature Granulocytes: 0.08 10*3/uL — ABNORMAL HIGH (ref 0.00–0.07)
Basophils Absolute: 0.1 10*3/uL (ref 0.0–0.1)
Basophils Relative: 0 %
Eosinophils Absolute: 0.3 10*3/uL (ref 0.0–0.5)
Eosinophils Relative: 2 %
HCT: 40.4 % (ref 39.0–52.0)
Hemoglobin: 13.9 g/dL (ref 13.0–17.0)
Immature Granulocytes: 1 %
Lymphocytes Relative: 15 %
Lymphs Abs: 1.7 10*3/uL (ref 0.7–4.0)
MCH: 31.2 pg (ref 26.0–34.0)
MCHC: 34.4 g/dL (ref 30.0–36.0)
MCV: 90.8 fL (ref 80.0–100.0)
Monocytes Absolute: 0.5 10*3/uL (ref 0.1–1.0)
Monocytes Relative: 4 %
Neutro Abs: 9.2 10*3/uL — ABNORMAL HIGH (ref 1.7–7.7)
Neutrophils Relative %: 78 %
Platelet Count: 310 10*3/uL (ref 150–400)
RBC: 4.45 MIL/uL (ref 4.22–5.81)
RDW: 13 % (ref 11.5–15.5)
WBC Count: 11.9 10*3/uL — ABNORMAL HIGH (ref 4.0–10.5)
nRBC: 0 % (ref 0.0–0.2)

## 2021-04-16 LAB — CMP (CANCER CENTER ONLY)
ALT: 34 U/L (ref 0–44)
AST: 18 U/L (ref 15–41)
Albumin: 3.9 g/dL (ref 3.5–5.0)
Alkaline Phosphatase: 75 U/L (ref 38–126)
Anion gap: 8 (ref 5–15)
BUN: 20 mg/dL (ref 6–20)
CO2: 23 mmol/L (ref 22–32)
Calcium: 9.3 mg/dL (ref 8.9–10.3)
Chloride: 107 mmol/L (ref 98–111)
Creatinine: 1.14 mg/dL (ref 0.61–1.24)
GFR, Estimated: >60 mL/min (ref >60)
Glucose, Bld: 147 mg/dL — ABNORMAL HIGH (ref 70–99)
Potassium: 3.9 mmol/L (ref 3.5–5.1)
Sodium: 138 mmol/L (ref 135–145)
Total Bilirubin: 0.4 mg/dL (ref 0.3–1.2)
Total Protein: 7 g/dL (ref 6.5–8.1)

## 2021-04-16 LAB — TSH: TSH: 2.375 u[IU]/mL (ref 0.320–4.118)

## 2021-04-16 MED ORDER — SODIUM CHLORIDE 0.9 % IV SOLN
480.0000 mg | Freq: Once | INTRAVENOUS | Status: AC
  Administered 2021-04-16: 480 mg via INTRAVENOUS
  Filled 2021-04-16: qty 48

## 2021-04-16 MED ORDER — ZOLEDRONIC ACID 4 MG/100ML IV SOLN
4.0000 mg | Freq: Once | INTRAVENOUS | Status: AC
  Administered 2021-04-16: 4 mg via INTRAVENOUS
  Filled 2021-04-16: qty 100

## 2021-04-16 MED ORDER — SODIUM CHLORIDE 0.9 % IV SOLN: Freq: Once | INTRAVENOUS | Status: AC

## 2021-04-16 MED ORDER — ACETAMINOPHEN 325 MG PO TABS
650.0000 mg | ORAL_TABLET | Freq: Once | ORAL | Status: AC
  Administered 2021-04-16: 650 mg via ORAL
  Filled 2021-04-16: qty 2

## 2021-04-16 NOTE — Patient Instructions (Signed)
[facility]  Discharge Instructions: ?Thank you for choosing [facility] to provide your oncology and hematology care.  ? ?If you have a lab appointment with the Cancer Center, please go directly to the Cancer Center and check in at the registration area. ?  ?Wear comfortable clothing and clothing appropriate for easy access to any Portacath or PICC line.  ? ?We strive to give you quality time with your provider. You may need to reschedule your appointment if you arrive late (15 or more minutes).  Arriving late affects you and other patients whose appointments are after yours.  Also, if you miss three or more appointments without notifying the office, you may be dismissed from the clinic at the provider?s discretion.    ?  ?For prescription refill requests, have your pharmacy contact our office and allow 72 hours for refills to be completed.   ? ?Today you received the following chemotherapy and/or immunotherapy agents Opdivo    ?  ?To help prevent nausea and vomiting after your treatment, we encourage you to take your nausea medication as directed. ? ?BELOW ARE SYMPTOMS THAT SHOULD BE REPORTED IMMEDIATELY: ?*FEVER GREATER THAN 100.4 F (38 ?C) OR HIGHER ?*CHILLS OR SWEATING ?*NAUSEA AND VOMITING THAT IS NOT CONTROLLED WITH YOUR NAUSEA MEDICATION ?*UNUSUAL SHORTNESS OF BREATH ?*UNUSUAL BRUISING OR BLEEDING ?*URINARY PROBLEMS (pain or burning when urinating, or frequent urination) ?*BOWEL PROBLEMS (unusual diarrhea, constipation, pain near the anus) ?TENDERNESS IN MOUTH AND THROAT WITH OR WITHOUT PRESENCE OF ULCERS (sore throat, sores in mouth, or a toothache) ?UNUSUAL RASH, SWELLING OR PAIN  ?UNUSUAL VAGINAL DISCHARGE OR ITCHING  ? ?Items with * indicate a potential emergency and should be followed up as soon as possible or go to the Emergency Department if any problems should occur. ? ?Please show the CHEMOTHERAPY ALERT CARD or IMMUNOTHERAPY ALERT CARD at check-in to the  Emergency Department and triage nurse. ? ?Should you have questions after your visit or need to cancel or reschedule your appointment, please contact [facility]  Dept: [phone]  and follow the prompts.  Office hours are 8:00 a.m. to 4:30 p.m. Monday - Friday. Please note that voicemails left after 4:00 p.m. may not be returned until the following business day.  We are closed weekends and major holidays. You have access to a nurse at all times for urgent questions. Please call the main number to the clinic Dept: [phone] and follow the prompts. ? ? ?For any non-urgent questions, you may also contact your provider using MyChart. We now offer e-Visits for anyone 18 and older to request care online for non-urgent symptoms. For details visit [email or web address]. ?  ?Also download the MyChart app! Go to the app store, search "MyChart", open the app, select [facility], and log in with your MyChart username and password. ? ?Due to Covid, a mask is required upon entering the hospital/clinic. If you do not have a mask, one will be given to you upon arrival. For doctor visits, patients may have 1 support person aged 18 or older with them. For treatment visits, patients cannot have anyone with them due to current Covid guidelines and our immunocompromised population.  ? ?

## 2021-04-16 NOTE — Progress Notes (Signed)
Hematology and Oncology Follow Up Visit  Bob Ewing [id number] 1971-03-31 50 y.o. 04/16/2021 12:33 PM Bob Ewing, MDShadad, Bob Dad, MD   Principle Diagnosis: 53 year old man with kidney cancer diagnosed in September 2022.  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   Ipilimumab 1 mg/kg and nivolumab 3 mg/kg cycle 1 given on October 02, 2020.  Completed cycle 4 of therapy on Dec 03, 2020.  Current therapy: Nivolumab 480 mg every 4 weeks.  He presents for subsequent cycle of treatment.  Interim History: Bob Ewing returns today for a follow-up visit.  Since the last visit, he reports no major changes in his health.  He does report occasional left upper back pain in the last 2 days.  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  He denies any other complications related to nivolumab.  He denies any skin rashes or lesions.  He denies any changes in his bowel habits.     Medications: Updated on review. Current Outpatient Medications  Medication Sig Dispense Refill   aspirin 81 MG chewable tablet Chew 1 tablet (81 mg total) by mouth daily. 90 tablet 0   atenolol (TENORMIN) 50 MG tablet Take 50 mg by mouth daily.     diphenhydrAMINE (BENADRYL) 25 mg capsule Take 1 capsule (25 mg total) by mouth every 6 (six) hours as needed for up to 5 days for itching (rash). 20 capsule 0   hydrochlorothiazide (HYDRODIURIL) 25 MG tablet Take 1 tablet by mouth every morning.     HYDROcodone-acetaminophen (NORCO) 10-325 MG tablet Take 1 tablet by mouth every 4 (four) hours as needed for moderate pain or severe pain.     metFORMIN (GLUCOPHAGE) 500 MG tablet Take 1 tablet by mouth daily.     phentermine 37.5 MG  capsule Take 37.5 mg by mouth every morning.     triamcinolone ointment (KENALOG) 0.5 % APPLY TOPICALLY TO THE AFFECTED AREA TWICE DAILY 30 g 0   No current facility-administered medications for this visit.     Allergies:  Allergies  Allergen Reactions   Phentermine     Kidney pain      Physical Exam:    Blood pressure 125/82, pulse 75, temperature 98.2 F (36.8 C), temperature source Oral, resp. rate 18, height 5' 7.5" (1.715 m), weight 218 lb (98.9 kg), SpO2 100 %.     ECOG: 1    General appearance: Alert, awake without any distress. Head: Atraumatic without abnormalities Oropharynx: Without any thrush or ulcers. Eyes: No scleral icterus. Lymph nodes: No lymphadenopathy noted in the cervical, supraclavicular, or axillary nodes Heart:regular rate and rhythm, without any murmurs or gallops.   Lung: Clear to auscultation without any rhonchi, wheezes or dullness to percussion. Abdomin: Soft, nontender without any shifting dullness or ascites. Musculoskeletal: No clubbing or cyanosis. Neurological: No motor or sensory deficits. Skin: No rashes or lesions.           Lab Results: Lab Results  Component Value Date   WBC 9.2 03/19/2021   HGB 14.8 03/19/2021   HCT 43.8 03/19/2021   MCV 91.3 03/19/2021   PLT 314 03/19/2021     Chemistry      Component Value Date/Time   NA 139 03/19/2021 [id number]  K 4.1 03/19/2021 0808   CL 103 03/19/2021 0808   CO2 24 03/19/2021 0808   BUN 24 (H) 03/19/2021 0808   CREATININE 1.38 (H) 03/19/2021 0808      Component Value Date/Time   CALCIUM 9.6 03/19/2021 0808   ALKPHOS 76 03/19/2021 0808   AST 18 03/19/2021 0808   ALT 38 03/19/2021 0808   BILITOT 0.4 03/19/2021 0808        Impression and Plan:  50 year old man with:   1.  Kidney cancer diagnosed in September 2021.  He was found to have Sage IV clear-cell renal cell carcinoma with rhabdoid features.   His disease status was updated at this time and treatment  choices were reviewed.  He continues to be on nivolumab maintenance and has tolerated it well.  Risks and benefits of continuing this treatment were discussed and the plan is to update his staging scans before the next visit.  Different salvage therapy options will be used if disease progression is noted.  He is agreeable to proceed.  2.  IV access: Peripheral veins are currently in use at this time.   3.  Antiemetics: Compazine is available to him without any nausea or vomiting.   4.  CNS surveillance: No evidence of metastatic disease with repeat staging scans tentatively in March 2023.      5.  Goals of care and prognosis: Aggressive measures are warranted given his age and performance status.  Therapy remains palliative however.   6.  Immune mediated complications: Complications including pneumonitis, colitis and thyroid disease were reviewed.  He has not experienced any complications at this time.   7.  Follow-up: In 4 weeks for repeat follow-up.   30  minutes were spent on this visit.  Time was dedicated to reviewing laboratory data, disease status update, treatment choices and future plan of care review.     Bob Button, MD 9/16/202212:33 PM

## 2021-05-13 ENCOUNTER — Other Ambulatory Visit: Payer: Self-pay

## 2021-05-13 ENCOUNTER — Ambulatory Visit (HOSPITAL_COMMUNITY)
Admission: RE | Admit: 2021-05-13 | Discharge: 2021-05-13 | Disposition: A | Discharge status: Home / Self Care | Payer: 59 | Source: Ambulatory Visit | Attending: Oncology | Admitting: Oncology

## 2021-05-13 DIAGNOSIS — C642 Malignant neoplasm of left kidney, except renal pelvis: Secondary | ICD-10-CM | POA: Insufficient documentation

## 2021-05-13 IMAGING — CT CT CHEST W/ CM
2 of 4 series · 15 of 46 positions shown, 17 images · IV contrast (omnipaque)
Comparison: [DATE]

CLINICAL DATA: Metastatic left renal cell carcinoma, pulmonary,
nodal, and osseous metastatic disease

EXAM:
CT CHEST WITH CONTRAST
CT ABDOMEN AND PELVIS WITH AND WITHOUT CONTRAST
TECHNIQUE: Multidetector CT imaging of the chest was performed during
intravenous contrast administration. Multidetector CT imaging of the
abdomen and pelvis was performed following the standard protocol
before and during bolus administration of intravenous contrast.
CONTRAST:  80mL OMNIPAQUE IOHEXOL 350 MG/ML SOLN, additional oral
enteric contrast

[Series 3: chest with · axial · 0.81mm/px · z∈[-682,-400]mm · 12 of 163 slices shown, 14 images]
[im 11/163  soft-tissue]
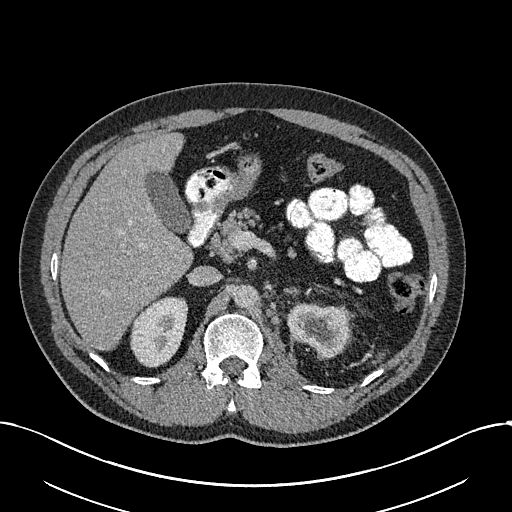
[im 11/163  bone]
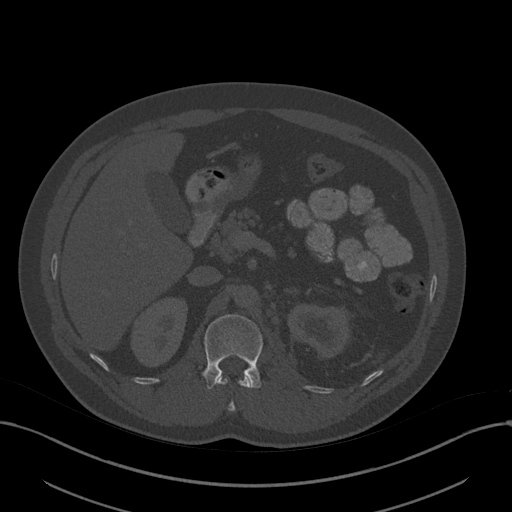
[im 21/163  soft-tissue]
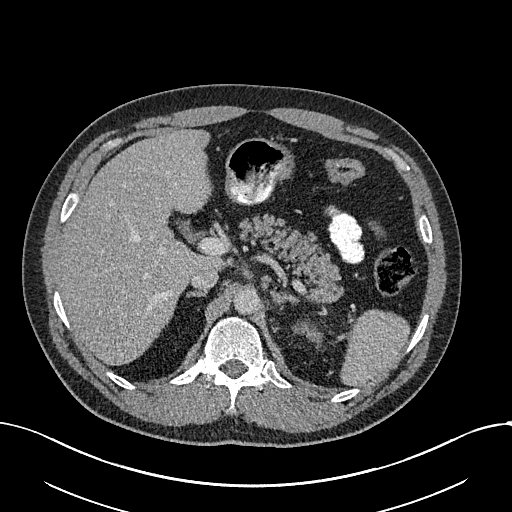
[im 41/163  soft-tissue]
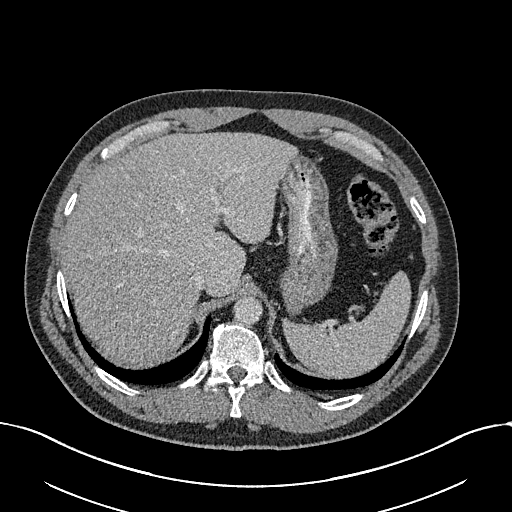
[im 51/163  soft-tissue]
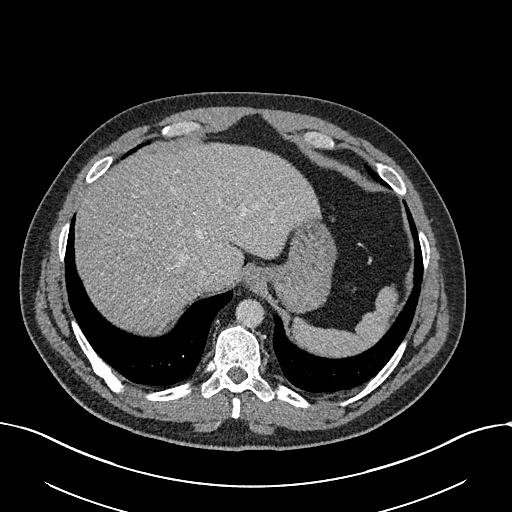
[im 61/163  soft-tissue]
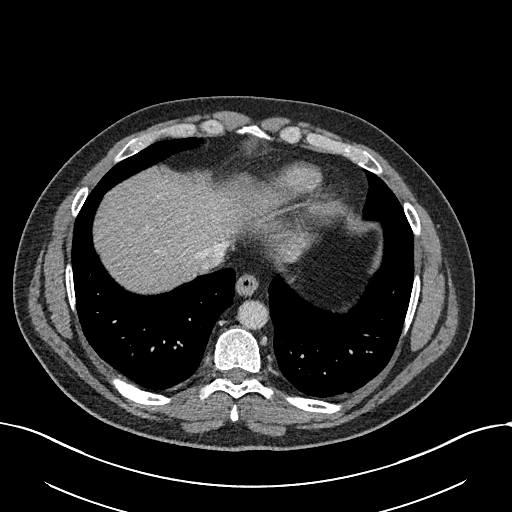
[im 71/163  soft-tissue]
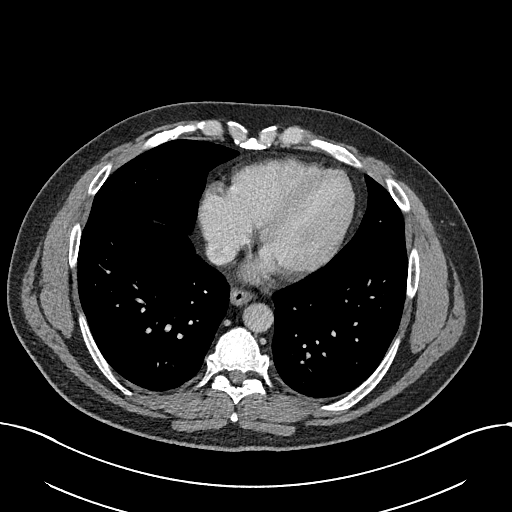
[im 92/163  soft-tissue]
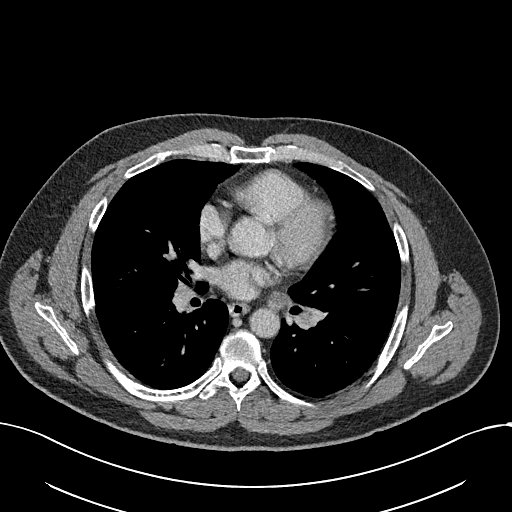
[im 102/163  soft-tissue]
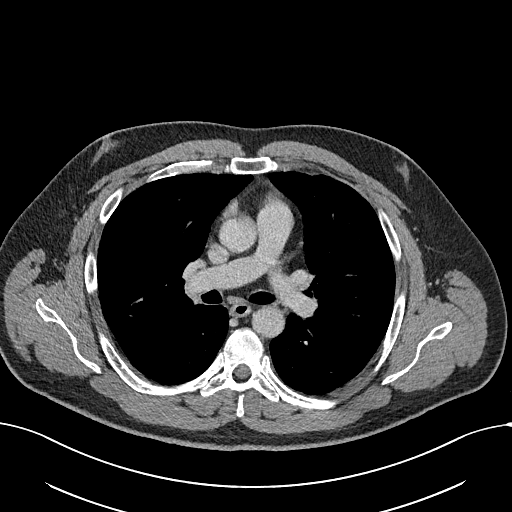
[im 112/163  soft-tissue]
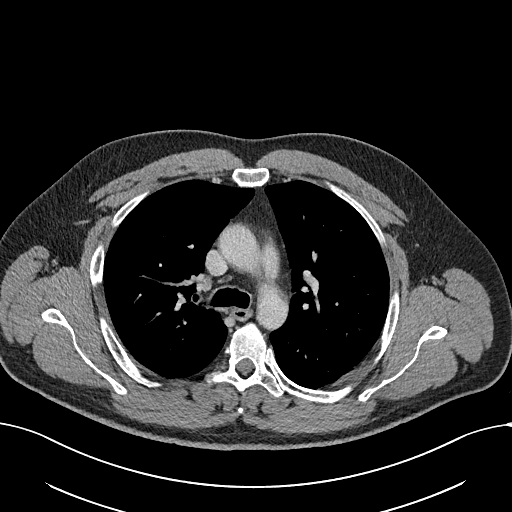
[im 112/163  bone]
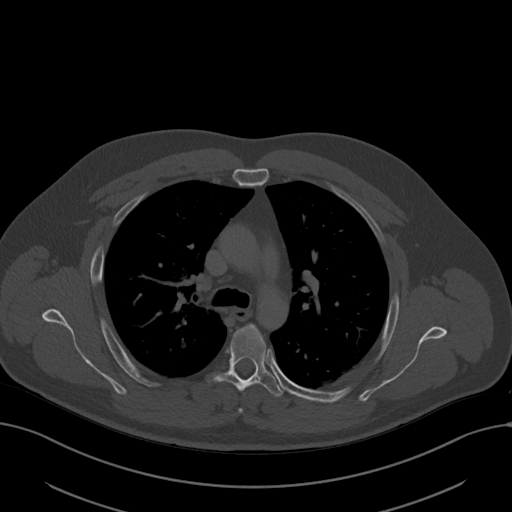
[im 122/163  soft-tissue]
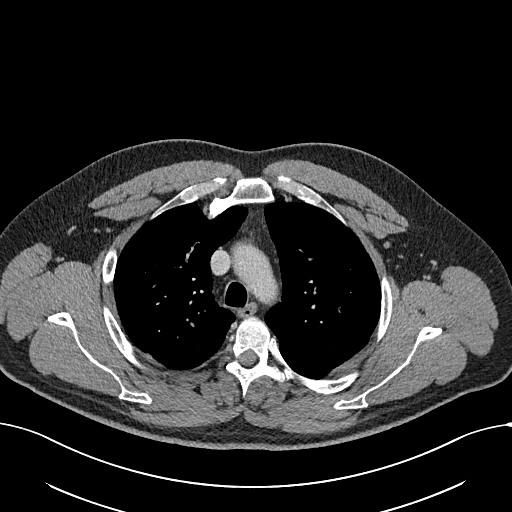
[im 142/163  soft-tissue]
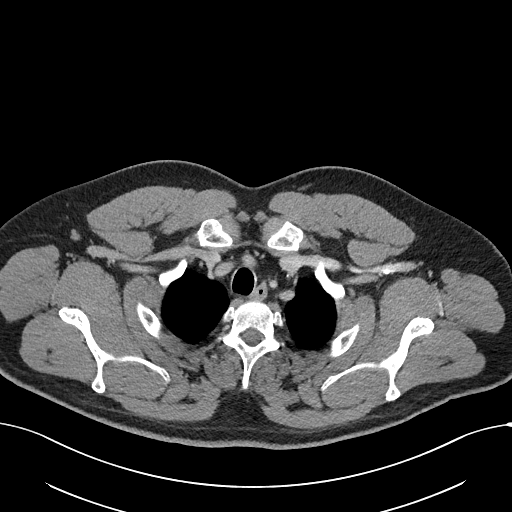
[im 152/163  soft-tissue]
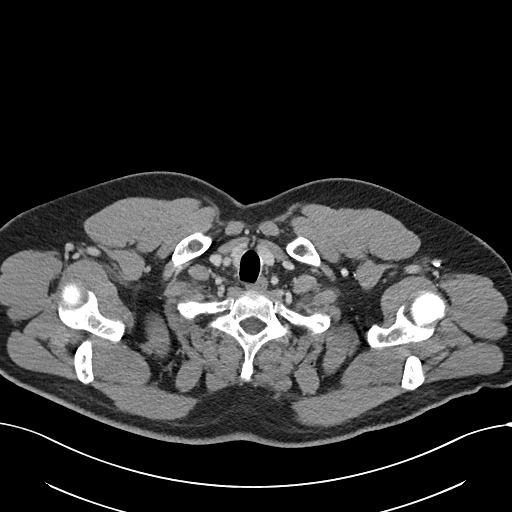

[Series 7: chest coronal · coronal · 0.75mm/px · 3 of 144 slices shown]
[im 48/144  soft-tissue]
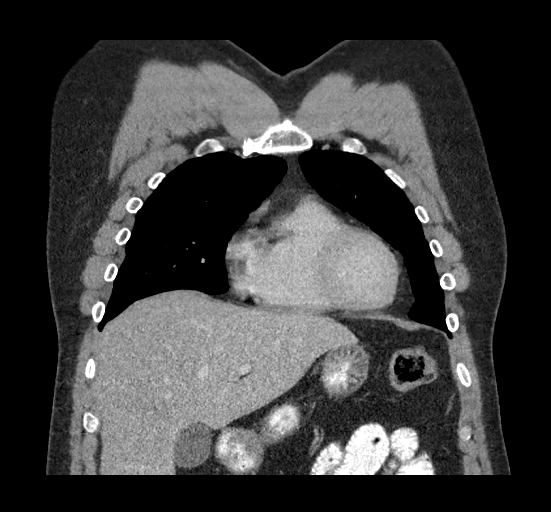
[im 64/144  soft-tissue]
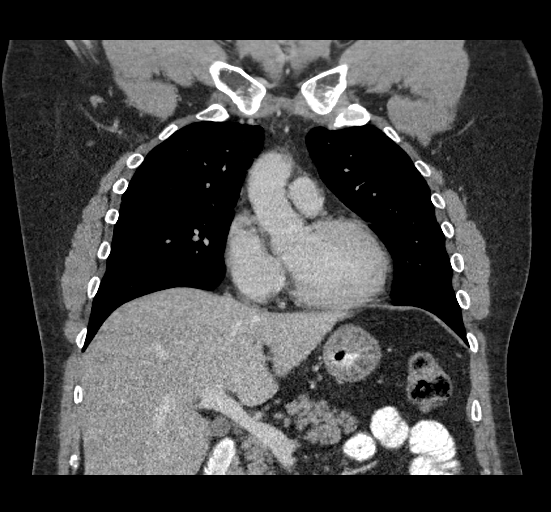
[im 80/144  soft-tissue]
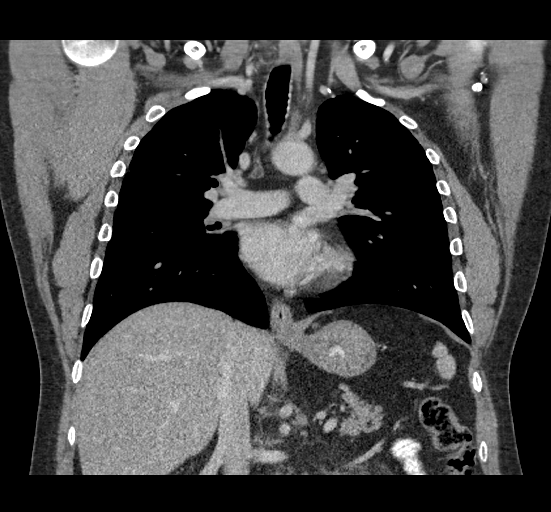

[15 of 46 positions shown; findings below may reference images not displayed]

FINDINGS: CT CHEST FINDINGS

Cardiovascular: No significant vascular findings. Normal heart size.
No pericardial effusion.

Mediastinum/Nodes: No enlarged mediastinal, hilar, or axillary lymph
nodes. Thyroid gland, trachea, and esophagus demonstrate no
significant findings.

Lungs/Pleura: Mild paraseptal emphysema. Background of very fine
centrilobular pulmonary nodularity, most concentrated in the lung
apices. Multiple previously seen pulmonary metastatic nodules remain
almost completely resolved and are further diminished in size
compared to prior examination, for example a nodule of the medial
right apex measuring no greater than 0.3 cm, previously 0.6 cm
(series 6, image 24), a nodule of the medial right lower lobe, which
is now almost completely imperceptible, previously no greater than
0.4 cm (series 6, image 94), and a nodule of the inferior left lower
lobe, measuring no greater than 0.3 cm, previously 0.6 cm (series 6,
image 105). No pleural effusion or pneumothorax.

Musculoskeletal: No chest wall mass.

CT ABDOMEN PELVIS FINDINGS

Hepatobiliary: No solid liver abnormality is seen. Hepatic
steatosis. No gallstones, gallbladder wall thickening, or biliary
dilatation.

Pancreas: Unremarkable. No pancreatic ductal dilatation or
surrounding inflammatory changes.

Spleen: Normal in size without significant abnormality.

Adrenals/Urinary Tract: Adrenal glands are unremarkable. An
exophytic mass of the midportion of the left kidney is diminished in
size, now measuring 7.4 x 5.1 cm, previously 9.2 x 7.0 cm (series 3,
image 66). Bladder is unremarkable.

Stomach/Bowel: Stomach is within normal limits. Appendix appears
normal. No evidence of bowel wall thickening, distention, or
inflammatory changes.

Vascular/Lymphatic: No significant vascular findings are present.
Unchanged enlarged, matted retroperitoneal lymph nodes, largest node
on the left measuring 1.2 x 0.9 cm (series 3, image 57).

Reproductive: Mild prostatomegaly.

Other: No abdominal wall hernia or abnormality. No abdominopelvic
ascites.

Musculoskeletal: Redemonstrated, expansile sclerotic lesion of the
posterior right ninth rib (series 6, image 106).
IMPRESSION: 1. An exophytic mass of the midportion of the left kidney is
diminished in size, now measuring 7.4 x 5.1 cm, previously 9.2 x
cm.
2. Unchanged enlarged, matted retroperitoneal lymph nodes.
3. Multiple previously seen pulmonary metastatic nodules remain
almost completely resolved and are further diminished in size
compared to prior examination.
4. Constellation of findings is consistent with treatment response
of renal cell carcinoma and metastatic disease.
5. Redemonstrated, expansile sclerotic lesion of the posterior right
ninth rib, consistent with treated osseous metastatic disease.
6. Emphysema.
7. Background of very fine centrilobular pulmonary nodules, most
concentrated in the lung apices, most commonly seen in
smoking-related respiratory bronchiolitis.

## 2021-05-13 MED ORDER — IOHEXOL 350 MG/ML SOLN
80.0000 mL | Freq: Once | INTRAVENOUS | Status: AC | PRN
  Administered 2021-05-13: 80 mL via INTRAVENOUS

## 2021-05-14 ENCOUNTER — Inpatient Hospital Stay (HOSPITAL_BASED_OUTPATIENT_CLINIC_OR_DEPARTMENT_OTHER): Payer: 59 | Admitting: Oncology

## 2021-05-14 ENCOUNTER — Inpatient Hospital Stay: Payer: 59 | Attending: Medical

## 2021-05-14 ENCOUNTER — Inpatient Hospital Stay: Payer: 59

## 2021-05-14 VITALS — BP 129/70 | HR 78 | Temp 97.9°F | Resp 18 | Ht 67.5 in | Wt 220.3 lb

## 2021-05-14 DIAGNOSIS — C642 Malignant neoplasm of left kidney, except renal pelvis: Secondary | ICD-10-CM | POA: Diagnosis not present

## 2021-05-14 DIAGNOSIS — Z5112 Encounter for antineoplastic immunotherapy: Secondary | ICD-10-CM | POA: Diagnosis not present

## 2021-05-14 LAB — CBC WITH DIFFERENTIAL (CANCER CENTER ONLY)
Abs Immature Granulocytes: 0.06 10*3/uL (ref 0.00–0.07)
Basophils Absolute: 0 10*3/uL (ref 0.0–0.1)
Basophils Relative: 0 %
Eosinophils Absolute: 0.3 10*3/uL (ref 0.0–0.5)
Eosinophils Relative: 3 %
HCT: 44.8 % (ref 39.0–52.0)
Hemoglobin: 15 g/dL (ref 13.0–17.0)
Immature Granulocytes: 1 %
Lymphocytes Relative: 11 %
Lymphs Abs: 1 10*3/uL (ref 0.7–4.0)
MCH: 31 pg (ref 26.0–34.0)
MCHC: 33.5 g/dL (ref 30.0–36.0)
MCV: 92.6 fL (ref 80.0–100.0)
Monocytes Absolute: 0.2 10*3/uL (ref 0.1–1.0)
Monocytes Relative: 3 %
Neutro Abs: 7.4 10*3/uL (ref 1.7–7.7)
Neutrophils Relative %: 82 %
Platelet Count: 286 10*3/uL (ref 150–400)
RBC: 4.84 MIL/uL (ref 4.22–5.81)
RDW: 12.9 % (ref 11.5–15.5)
WBC Count: 9 10*3/uL (ref 4.0–10.5)
nRBC: 0 % (ref 0.0–0.2)

## 2021-05-14 LAB — CMP (CANCER CENTER ONLY)
ALT: 32 U/L (ref 0–44)
AST: 22 U/L (ref 15–41)
Albumin: 4.4 g/dL (ref 3.5–5.0)
Alkaline Phosphatase: 66 U/L (ref 38–126)
Anion gap: 9 (ref 5–15)
BUN: 20 mg/dL (ref 6–20)
CO2: 28 mmol/L (ref 22–32)
Calcium: 9.1 mg/dL (ref 8.9–10.3)
Chloride: 97 mmol/L — ABNORMAL LOW (ref 98–111)
Creatinine: 1.39 mg/dL — ABNORMAL HIGH (ref 0.61–1.24)
GFR, Estimated: >60 mL/min (ref >60)
Glucose, Bld: 123 mg/dL — ABNORMAL HIGH (ref 70–99)
Potassium: 3.6 mmol/L (ref 3.5–5.1)
Sodium: 134 mmol/L — ABNORMAL LOW (ref 135–145)
Total Bilirubin: 0.4 mg/dL (ref 0.3–1.2)
Total Protein: 7.8 g/dL (ref 6.5–8.1)

## 2021-05-14 LAB — TSH: TSH: 5.253 u[IU]/mL — ABNORMAL HIGH (ref 0.320–4.118)

## 2021-05-14 MED ORDER — SODIUM CHLORIDE 0.9 % IV SOLN: Freq: Once | INTRAVENOUS | Status: AC

## 2021-05-14 MED ORDER — ACETAMINOPHEN 325 MG PO TABS
650.0000 mg | ORAL_TABLET | Freq: Once | ORAL | Status: AC
  Administered 2021-05-14: 650 mg via ORAL
  Filled 2021-05-14: qty 2

## 2021-05-14 MED ORDER — SODIUM CHLORIDE 0.9 % IV SOLN
480.0000 mg | Freq: Once | INTRAVENOUS | Status: AC
  Administered 2021-05-14: 480 mg via INTRAVENOUS
  Filled 2021-05-14: qty 48

## 2021-05-14 NOTE — Progress Notes (Signed)
Hematology and Oncology Follow Up Visit  Bob Ewing [id number] [birth year] 50 y.o. 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  He had presented with pulmonary and subcutaneous involvement.  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   Ipilimumab 1 mg/kg and nivolumab 3 mg/kg cycle 1 given on October 02, 2020.  Completed cycle 4 of therapy on Dec 03, 2020.  Current therapy: Nivolumab 480 mg every 4 weeks.  He is here for the next cycle of treatment.   Interim History: Mr. Brubacher is here for a follow-up visit.  Since the last visit, he reports feeling well without any major complaints.  He denies any nausea, vomiting or abdominal pain.  He denies any skin rashes or lesions.  Denies any hospitalizations or illnesses.     Medications: Reviewed without changes. Current Outpatient Medications  Medication Sig Dispense Refill   aspirin 81 MG chewable tablet Chew 1 tablet (81 mg total) by mouth daily. 90 tablet 0   atenolol (TENORMIN) 50 MG tablet Take 50 mg by mouth daily.     diphenhydrAMINE (BENADRYL) 25 mg capsule Take 1 capsule (25 mg total) by mouth every 6 (six) hours as needed for up to 5 days for itching (rash). 20 capsule 0   hydrochlorothiazide (HYDRODIURIL) 25 MG tablet Take 1 tablet by mouth every morning.     HYDROcodone-acetaminophen (NORCO) 10-325 MG tablet Take 1 tablet by mouth every 4 (four) hours as needed for moderate pain or severe pain.     metFORMIN (GLUCOPHAGE) 500 MG tablet Take 1 tablet by mouth daily.     phentermine 37.5 MG capsule Take 37.5 mg by mouth every morning.     triamcinolone ointment (KENALOG) 0.5 % APPLY TOPICALLY TO THE AFFECTED AREA TWICE DAILY 30 g 0   No current facility-administered  medications for this visit.     Allergies:  Allergies  Allergen Reactions   Phentermine     Kidney pain      Physical Exam:    Blood pressure 129/70, pulse 78, temperature 97.9 F (36.6 C), resp. rate 18, height 5' 7.5" (1.715 m), weight 220 lb 4.8 oz (99.9 kg), SpO2 96 %.     ECOG: 1   General appearance: Comfortable appearing without any discomfort Head: Normocephalic without any trauma Oropharynx: Mucous membranes are moist and pink without any thrush or ulcers. Eyes: Pupils are equal and round reactive to light. Lymph nodes: No cervical, supraclavicular, inguinal or axillary lymphadenopathy.   Heart:regular rate and rhythm.  S1 and S2 without leg edema. Lung: Clear without any rhonchi or wheezes.  No dullness to percussion. Abdomin: Soft, nontender, nondistended with good bowel sounds.  No hepatosplenomegaly. Musculoskeletal: No joint deformity or effusion.  Full range of motion noted. Neurological: No deficits noted on motor, sensory and deep tendon reflex exam. Skin: No petechial rash or dryness.  Appeared moist.             Lab Results: Lab Results  Component Value Date   WBC 9.0 05/14/2021   HGB 15.0 05/14/2021   HCT 44.8 05/14/2021   MCV 92.6 05/14/2021   PLT 286 05/14/2021     Chemistry      Component Value Date/Time   NA 138 04/16/2021 1238   K  3.9 04/16/2021 1238   CL 107 04/16/2021 1238   CO2 23 04/16/2021 1238   BUN 20 04/16/2021 1238   CREATININE 1.14 04/16/2021 1238      Component Value Date/Time   CALCIUM 9.3 04/16/2021 1238   ALKPHOS 75 04/16/2021 1238   AST 18 04/16/2021 1238   ALT 34 04/16/2021 1238   BILITOT 0.4 04/16/2021 1238        Impression and Plan:  50 year old man with:   1.  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   He is currently on nivolumab maintenance without any major complaints.  CT scan obtained on 05/13/2021 was personally reviewed and  discussed with the patient.  He continues to have excellent response to treatment without any residual metastatic disease.  He continues to have prominent kidney mass which has shrunk at this time.  Risks and benefits of continuing systemic therapy were discussed.  The rationale for using salvage nephrectomy was also reviewed at this time.  I am in favor of a salvage nephrectomy if he continues to have excellent disease control.  The plan to reevaluate that after his next scan which will be in the next 3 to 4 months.  2.  IV access: Continues to be in use without any issues.   3.  Antiemetics: No nausea or vomiting reported at this time.  Compazine is available to him.   4.  CNS surveillance: No evidence of metastatic disease noted on previous MRI.  We will continue to monitor.      5.  Goals of care and prognosis: Therapy remains palliative although aggressive measures are warranted with curative intent could be a possibility.   6.  Immune mediated complications: He is not experiencing any at this time.  We will continue to monitor.  Issues such as pneumonitis colitis and thyroid disease were reiterated   7.  Follow-up: He will return and 1 month for the next cycle of therapy.   30  minutes were dedicated to this encounter.  Time spent on reviewing laboratory data, disease status update and outlining future plan of care.     Zola Button, MD 10/14/202212:12 PM

## 2021-05-14 NOTE — Patient Instructions (Signed)
[facility]   ?Discharge Instructions: ?Thank you for choosing [facility] to provide your oncology and hematology care.  ? ?If you have a lab appointment with the Cancer Center, please go directly to the Cancer Center and check in at the registration area. ?  ?Wear comfortable clothing and clothing appropriate for easy access to any Portacath or PICC line.  ? ?We strive to give you quality time with your provider. You may need to reschedule your appointment if you arrive late (15 or more minutes).  Arriving late affects you and other patients whose appointments are after yours.  Also, if you miss three or more appointments without notifying the office, you may be dismissed from the clinic at the provider?s discretion.    ?  ?For prescription refill requests, have your pharmacy contact our office and allow 72 hours for refills to be completed.   ? ?Today you received the following chemotherapy and/or immunotherapy agents: Nivolumab (Opdivo)    ?  ?To help prevent nausea and vomiting after your treatment, we encourage you to take your nausea medication as directed. ? ?BELOW ARE SYMPTOMS THAT SHOULD BE REPORTED IMMEDIATELY: ?*FEVER GREATER THAN 100.4 F (38 ?C) OR HIGHER ?*CHILLS OR SWEATING ?*NAUSEA AND VOMITING THAT IS NOT CONTROLLED WITH YOUR NAUSEA MEDICATION ?*UNUSUAL SHORTNESS OF BREATH ?*UNUSUAL BRUISING OR BLEEDING ?*URINARY PROBLEMS (pain or burning when urinating, or frequent urination) ?*BOWEL PROBLEMS (unusual diarrhea, constipation, pain near the anus) ?TENDERNESS IN MOUTH AND THROAT WITH OR WITHOUT PRESENCE OF ULCERS (sore throat, sores in mouth, or a toothache) ?UNUSUAL RASH, SWELLING OR PAIN  ?UNUSUAL VAGINAL DISCHARGE OR ITCHING  ? ?Items with * indicate a potential emergency and should be followed up as soon as possible or go to the Emergency Department if any problems should occur. ? ?Please show the CHEMOTHERAPY ALERT CARD or IMMUNOTHERAPY ALERT CARD at  check-in to the Emergency Department and triage nurse. ? ?Should you have questions after your visit or need to cancel or reschedule your appointment, please contact [facility]  Dept: [phone]  and follow the prompts.  Office hours are 8:00 a.m. to 4:30 p.m. Monday - Friday. Please note that voicemails left after 4:00 p.m. may not be returned until the following business day.  We are closed weekends and major holidays. You have access to a nurse at all times for urgent questions. Please call the main number to the clinic Dept: [phone] and follow the prompts. ? ? ?For any non-urgent questions, you may also contact your provider using MyChart. We now offer e-Visits for anyone 18 and older to request care online for non-urgent symptoms. For details visit [email or web address]. ?  ?Also download the MyChart app! Go to the app store, search "MyChart", open the app, select [facility], and log in with your MyChart username and password. ? ?Due to Covid, a mask is required upon entering the hospital/clinic. If you do not have a mask, one will be given to you upon arrival. For doctor visits, patients may have 1 support person aged 18 or older with them. For treatment visits, patients cannot have anyone with them due to current Covid guidelines and our immunocompromised population.  ? ?

## 2021-05-14 NOTE — Progress Notes (Signed)
Per Dr. Alen Blew - okay to proceed with patient's previous CMET results.

## 2021-06-11 ENCOUNTER — Inpatient Hospital Stay (HOSPITAL_BASED_OUTPATIENT_CLINIC_OR_DEPARTMENT_OTHER): Payer: 59 | Admitting: Oncology

## 2021-06-11 ENCOUNTER — Inpatient Hospital Stay: Payer: 59

## 2021-06-11 ENCOUNTER — Other Ambulatory Visit: Payer: Self-pay

## 2021-06-11 ENCOUNTER — Inpatient Hospital Stay: Payer: 59 | Attending: Medical

## 2021-06-11 VITALS — BP 137/83 | HR 75 | Temp 97.8°F | Resp 16 | Ht 67.5 in | Wt 221.4 lb

## 2021-06-11 DIAGNOSIS — C778 Secondary and unspecified malignant neoplasm of lymph nodes of multiple regions: Secondary | ICD-10-CM | POA: Diagnosis not present

## 2021-06-11 DIAGNOSIS — Z5112 Encounter for antineoplastic immunotherapy: Secondary | ICD-10-CM | POA: Insufficient documentation

## 2021-06-11 DIAGNOSIS — C649 Malignant neoplasm of unspecified kidney, except renal pelvis: Secondary | ICD-10-CM | POA: Insufficient documentation

## 2021-06-11 DIAGNOSIS — Z79899 Other long term (current) drug therapy: Secondary | ICD-10-CM | POA: Insufficient documentation

## 2021-06-11 DIAGNOSIS — C642 Malignant neoplasm of left kidney, except renal pelvis: Secondary | ICD-10-CM

## 2021-06-11 LAB — CBC WITH DIFFERENTIAL (CANCER CENTER ONLY)
Abs Immature Granulocytes: 0.06 10*3/uL (ref 0.00–0.07)
Basophils Absolute: 0 10*3/uL (ref 0.0–0.1)
Basophils Relative: 0 %
Eosinophils Absolute: 0.3 10*3/uL (ref 0.0–0.5)
Eosinophils Relative: 2 %
HCT: 41.6 % (ref 39.0–52.0)
Hemoglobin: 14 g/dL (ref 13.0–17.0)
Immature Granulocytes: 1 %
Lymphocytes Relative: 17 %
Lymphs Abs: 1.9 10*3/uL (ref 0.7–4.0)
MCH: 31.1 pg (ref 26.0–34.0)
MCHC: 33.7 g/dL (ref 30.0–36.0)
MCV: 92.4 fL (ref 80.0–100.0)
Monocytes Absolute: 0.6 10*3/uL (ref 0.1–1.0)
Monocytes Relative: 5 %
Neutro Abs: 8.5 10*3/uL — ABNORMAL HIGH (ref 1.7–7.7)
Neutrophils Relative %: 75 %
Platelet Count: 304 10*3/uL (ref 150–400)
RBC: 4.5 MIL/uL (ref 4.22–5.81)
RDW: 12.8 % (ref 11.5–15.5)
WBC Count: 11.4 10*3/uL — ABNORMAL HIGH (ref 4.0–10.5)
nRBC: 0 % (ref 0.0–0.2)

## 2021-06-11 LAB — CMP (CANCER CENTER ONLY)
ALT: 42 U/L (ref 0–44)
AST: 18 U/L (ref 15–41)
Albumin: 3.8 g/dL (ref 3.5–5.0)
Alkaline Phosphatase: 88 U/L (ref 38–126)
Anion gap: 11 (ref 5–15)
BUN: 19 mg/dL (ref 6–20)
CO2: 24 mmol/L (ref 22–32)
Calcium: 9.2 mg/dL (ref 8.9–10.3)
Chloride: 104 mmol/L (ref 98–111)
Creatinine: 1.15 mg/dL (ref 0.61–1.24)
GFR, Estimated: >60 mL/min (ref >60)
Glucose, Bld: 148 mg/dL — ABNORMAL HIGH (ref 70–99)
Potassium: 3.7 mmol/L (ref 3.5–5.1)
Sodium: 139 mmol/L (ref 135–145)
Total Bilirubin: 0.2 mg/dL — ABNORMAL LOW (ref 0.3–1.2)
Total Protein: 7.4 g/dL (ref 6.5–8.1)

## 2021-06-11 LAB — TSH: TSH: 4.043 u[IU]/mL (ref 0.320–4.118)

## 2021-06-11 MED ORDER — ACETAMINOPHEN 325 MG PO TABS
650.0000 mg | ORAL_TABLET | Freq: Once | ORAL | Status: AC
  Administered 2021-06-11: 650 mg via ORAL
  Filled 2021-06-11: qty 2

## 2021-06-11 MED ORDER — SODIUM CHLORIDE 0.9 % IV SOLN: Freq: Once | INTRAVENOUS | Status: AC

## 2021-06-11 MED ORDER — SODIUM CHLORIDE 0.9 % IV SOLN
480.0000 mg | Freq: Once | INTRAVENOUS | Status: AC
  Administered 2021-06-11: 480 mg via INTRAVENOUS
  Filled 2021-06-11: qty 48

## 2021-06-11 NOTE — Patient Instructions (Signed)
[facility]  Discharge Instructions: Thank you for choosing [facility] to provide your oncology and hematology care.   If you have a lab appointment with the Cancer Center, please go directly to the Cancer Center and check in at the registration area.   Wear comfortable clothing and clothing appropriate for easy access to any Portacath or PICC line.   We strive to give you quality time with your provider. You may need to reschedule your appointment if you arrive late (15 or more minutes).  Arriving late affects you and other patients whose appointments are after yours.  Also, if you miss three or more appointments without notifying the office, you may be dismissed from the clinic at the provider's discretion.      For prescription refill requests, have your pharmacy contact our office and allow 72 hours for refills to be completed.    Today you received the following chemotherapy and/or immunotherapy agents: Nivolumab.      To help prevent nausea and vomiting after your treatment, we encourage you to take your nausea medication as directed.  BELOW ARE SYMPTOMS THAT SHOULD BE REPORTED IMMEDIATELY: *FEVER GREATER THAN 100.4 F (38 C) OR HIGHER *CHILLS OR SWEATING *NAUSEA AND VOMITING THAT IS NOT CONTROLLED WITH YOUR NAUSEA MEDICATION *UNUSUAL SHORTNESS OF BREATH *UNUSUAL BRUISING OR BLEEDING *URINARY PROBLEMS (pain or burning when urinating, or frequent urination) *BOWEL PROBLEMS (unusual diarrhea, constipation, pain near the anus) TENDERNESS IN MOUTH AND THROAT WITH OR WITHOUT PRESENCE OF ULCERS (sore throat, sores in mouth, or a toothache) UNUSUAL RASH, SWELLING OR PAIN  UNUSUAL VAGINAL DISCHARGE OR ITCHING   Items with * indicate a potential emergency and should be followed up as soon as possible or go to the Emergency Department if any problems should occur.  Please show the CHEMOTHERAPY ALERT CARD or IMMUNOTHERAPY ALERT CARD at check-in to  the Emergency Department and triage nurse.  Should you have questions after your visit or need to cancel or reschedule your appointment, please contact [facility]  Dept: [phone]  and follow the prompts.  Office hours are 8:00 a.m. to 4:30 p.m. Monday - Friday. Please note that voicemails left after 4:00 p.m. may not be returned until the following business day.  We are closed weekends and major holidays. You have access to a nurse at all times for urgent questions. Please call the main number to the clinic Dept: [phone] and follow the prompts.   For any non-urgent questions, you may also contact your provider using MyChart. We now offer e-Visits for anyone 18 and older to request care online for non-urgent symptoms. For details visit [email or web address].   Also download the MyChart app! Go to the app store, search "MyChart", open the app, select [facility], and log in with your MyChart username and password.  Due to Covid, a mask is required upon entering the hospital/clinic. If you do not have a mask, one will be given to you upon arrival. For doctor visits, patients may have 1 support person aged 18 or older with them. For treatment visits, patients cannot have anyone with them due to current Covid guidelines and our immunocompromised population.   

## 2021-06-11 NOTE — Progress Notes (Signed)
Hematology and Oncology Follow Up Visit  Bob Ewing [id number] 11/04/1970 50 y.o. 06/11/2021 11:32 AM Bob Ewing, MDShadad, [name], MD   Principle Diagnosis: 29 year old man with with kidney cancer diagnosed in February 2022.  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   Ipilimumab 1 mg/kg and nivolumab 3 mg/kg cycle 1 given on October 02, 2020.  Completed cycle 4 of therapy on Dec 03, 2020.  Current therapy: Nivolumab 480 mg every 4 weeks.  He returns for repeat evaluation in the next cycle of therapy.  Interim History: Bob Ewing presents today for a follow-up.  Since the last visit, he reports no major changes in his health.  He remains reasonably active and exercise regularly although his weight remained about the same.  He denies any nausea, vomiting or abdominal pain.  He denies any hospitalizations or illnesses.  He denies any skin rash or lesions.  He denies any changes in his bowel habits.     Medications: Updated on review. Current Outpatient Medications  Medication Sig Dispense Refill   aspirin 81 MG chewable tablet Chew 1 tablet (81 mg total) by mouth daily. 90 tablet 0   atenolol (TENORMIN) 50 MG tablet Take 50 mg by mouth daily.     diphenhydrAMINE (BENADRYL) 25 mg capsule Take 1 capsule (25 mg total) by mouth every 6 (six) hours as needed for up to 5 days for itching (rash). 20 capsule 0   hydrochlorothiazide (HYDRODIURIL) 25 MG tablet Take 1 tablet by mouth every morning.     HYDROcodone-acetaminophen (NORCO) 10-325 MG tablet Take 1 tablet by mouth every 4 (four) hours as needed for moderate pain or severe pain.     metFORMIN (GLUCOPHAGE) 500 MG tablet Take 1 tablet by mouth daily.     phentermine 37.5 MG capsule Take  37.5 mg by mouth every morning.     triamcinolone ointment (KENALOG) 0.5 % APPLY TOPICALLY TO THE AFFECTED AREA TWICE DAILY 30 g 0   No current facility-administered medications for this visit.     Allergies:  Allergies  Allergen Reactions   Phentermine     Kidney pain      Physical Exam:    Blood pressure 137/83, pulse 75, temperature 97.8 F (36.6 C), temperature source Temporal, resp. rate 16, height 5' 7.5" (1.715 m), weight 221 lb 6.4 oz (100.4 kg), SpO2 98 %.     ECOG: 1    General appearance: Alert, awake without any distress. Head: Atraumatic without abnormalities Oropharynx: Without any thrush or ulcers. Eyes: No scleral icterus. Lymph nodes: No lymphadenopathy noted in the cervical, supraclavicular, or axillary nodes Heart:regular rate and rhythm, without any murmurs or gallops.   Lung: Clear to auscultation without any rhonchi, wheezes or dullness to percussion. Abdomin: Soft, nontender without any shifting dullness or ascites. Musculoskeletal: No clubbing or cyanosis. Neurological: No motor or sensory deficits. Skin: No rashes or lesions.            Lab Results: Lab Results  Component Value Date   WBC 11.4 (H) 06/11/2021   HGB 14.0 06/11/2021   HCT 41.6 06/11/2021   MCV 92.4 06/11/2021   PLT 304 06/11/2021     Chemistry      Component Value Date/Time   NA 134 (L) 05/14/2021 1127  K 3.6 05/14/2021 1127   CL 97 (L) 05/14/2021 1127   CO2 28 05/14/2021 1127   BUN 20 05/14/2021 1127   CREATININE 1.39 (H) 05/14/2021 1127      Component Value Date/Time   CALCIUM 9.1 05/14/2021 1127   ALKPHOS 66 05/14/2021 1127   AST 22 05/14/2021 1127   ALT 32 05/14/2021 1127   BILITOT 0.4 05/14/2021 1127        Impression and Plan:  50 year old man with:   1.  Kidney cancer diagnosed and 2022.  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   The natural course of this  disease was discussed at this time and treatment choices were reiterated.  He continues to tolerate nivolumab without any issues.  CT scan in October 2022 showed no evidence of disease progression.  Plan is to continue with the same dose and schedule and update his staging scans and February 2023.  2.  IV access: No complications related to peripheral vein use at this time.   3.  Antiemetics: Compazine is available to him without any nausea or vomiting.   4.  CNS surveillance: No evidence of metastatic disease based on MRI obtained in 2022.  This will be repeated next year.      5.  Goals of care and prognosis: His disease is incurable but he had a complete response to therapy which supports the likelihood of a long term disease control.   6.  Immune mediated complications: I continue to educate him about potential complication including pneumonitis, colitis and thyroid disease.  His TSH is slowly rising and will replace as needed.  7.  Follow-up: In 4 weeks for repeat follow-up.   30  minutes were spent on this visit.  The time was dedicated to reviewing laboratory data, disease status update and outlining future plan of care discussion.       Zola Button, MD 11/11/202211:32 AM

## 2021-07-13 ENCOUNTER — Ambulatory Visit: Payer: 59 | Admitting: Oncology

## 2021-07-13 ENCOUNTER — Ambulatory Visit: Payer: 59

## 2021-07-13 ENCOUNTER — Other Ambulatory Visit: Payer: 59

## 2021-07-16 ENCOUNTER — Inpatient Hospital Stay: Payer: 59 | Attending: Medical

## 2021-07-16 ENCOUNTER — Inpatient Hospital Stay: Payer: 59

## 2021-07-16 ENCOUNTER — Inpatient Hospital Stay (HOSPITAL_BASED_OUTPATIENT_CLINIC_OR_DEPARTMENT_OTHER): Payer: 59 | Admitting: Oncology

## 2021-07-16 ENCOUNTER — Other Ambulatory Visit: Payer: Self-pay

## 2021-07-16 VITALS — BP 138/79 | HR 74 | Temp 98.1°F | Resp 18 | Ht 67.5 in | Wt 219.8 lb

## 2021-07-16 DIAGNOSIS — C649 Malignant neoplasm of unspecified kidney, except renal pelvis: Secondary | ICD-10-CM | POA: Diagnosis not present

## 2021-07-16 DIAGNOSIS — C7800 Secondary malignant neoplasm of unspecified lung: Secondary | ICD-10-CM | POA: Diagnosis not present

## 2021-07-16 DIAGNOSIS — C642 Malignant neoplasm of left kidney, except renal pelvis: Secondary | ICD-10-CM

## 2021-07-16 DIAGNOSIS — Z5112 Encounter for antineoplastic immunotherapy: Secondary | ICD-10-CM | POA: Diagnosis not present

## 2021-07-16 DIAGNOSIS — C7989 Secondary malignant neoplasm of other specified sites: Secondary | ICD-10-CM | POA: Insufficient documentation

## 2021-07-16 DIAGNOSIS — Z79899 Other long term (current) drug therapy: Secondary | ICD-10-CM | POA: Insufficient documentation

## 2021-07-16 LAB — CMP (CANCER CENTER ONLY)
ALT: 34 U/L (ref 0–44)
AST: 18 U/L (ref 15–41)
Albumin: 3.8 g/dL (ref 3.5–5.0)
Alkaline Phosphatase: 68 U/L (ref 38–126)
Anion gap: 9 (ref 5–15)
BUN: 17 mg/dL (ref 6–20)
CO2: 23 mmol/L (ref 22–32)
Calcium: 8.8 mg/dL — ABNORMAL LOW (ref 8.9–10.3)
Chloride: 106 mmol/L (ref 98–111)
Creatinine: 1.12 mg/dL (ref 0.61–1.24)
GFR, Estimated: >60 mL/min
Glucose, Bld: 160 mg/dL — ABNORMAL HIGH (ref 70–99)
Potassium: 3.8 mmol/L (ref 3.5–5.1)
Sodium: 138 mmol/L (ref 135–145)
Total Bilirubin: 0.2 mg/dL — ABNORMAL LOW (ref 0.3–1.2)
Total Protein: 7 g/dL (ref 6.5–8.1)

## 2021-07-16 LAB — TSH: TSH: 6.716 u[IU]/mL — ABNORMAL HIGH (ref 0.320–4.118)

## 2021-07-16 LAB — CBC WITH DIFFERENTIAL (CANCER CENTER ONLY)
Abs Immature Granulocytes: 0.06 10*3/uL (ref 0.00–0.07)
Basophils Absolute: 0 10*3/uL (ref 0.0–0.1)
Basophils Relative: 0 %
Eosinophils Absolute: 0.1 10*3/uL (ref 0.0–0.5)
Eosinophils Relative: 1 %
HCT: 39.7 % (ref 39.0–52.0)
Hemoglobin: 13.7 g/dL (ref 13.0–17.0)
Immature Granulocytes: 1 %
Lymphocytes Relative: 18 %
Lymphs Abs: 1.8 10*3/uL (ref 0.7–4.0)
MCH: 31.1 pg (ref 26.0–34.0)
MCHC: 34.5 g/dL (ref 30.0–36.0)
MCV: 90.2 fL (ref 80.0–100.0)
Monocytes Absolute: 0.5 10*3/uL (ref 0.1–1.0)
Monocytes Relative: 5 %
Neutro Abs: 7.3 10*3/uL (ref 1.7–7.7)
Neutrophils Relative %: 75 %
Platelet Count: 295 10*3/uL (ref 150–400)
RBC: 4.4 MIL/uL (ref 4.22–5.81)
RDW: 12.6 % (ref 11.5–15.5)
WBC Count: 9.7 10*3/uL (ref 4.0–10.5)
nRBC: 0 % (ref 0.0–0.2)

## 2021-07-16 MED ORDER — ZOLEDRONIC ACID 4 MG/100ML IV SOLN
4.0000 mg | Freq: Once | INTRAVENOUS | Status: AC
  Administered 2021-07-16: 4 mg via INTRAVENOUS

## 2021-07-16 MED ORDER — SODIUM CHLORIDE 0.9 % IV SOLN
480.0000 mg | Freq: Once | INTRAVENOUS | Status: AC
  Administered 2021-07-16: 480 mg via INTRAVENOUS
  Filled 2021-07-16: qty 48

## 2021-07-16 MED ORDER — SODIUM CHLORIDE 0.9 % IV SOLN: Freq: Once | INTRAVENOUS | Status: DC

## 2021-07-16 MED ORDER — ACETAMINOPHEN 325 MG PO TABS
650.0000 mg | ORAL_TABLET | Freq: Once | ORAL | Status: AC
  Administered 2021-07-16: 650 mg via ORAL

## 2021-07-16 NOTE — Patient Instructions (Signed)
[facility] ONCOLOGY  Discharge Instructions: Thank you for choosing [facility] to provide your oncology and hematology care.   If you have a lab appointment with the [facility], please go directly to the [facility] and check in at the registration area.   Wear comfortable clothing and clothing appropriate for easy access to any Portacath or PICC line.   We strive to give you quality time with your provider. You may need to reschedule your appointment if you arrive late (15 or more minutes).  Arriving late affects you and other patients whose appointments are after yours.  Also, if you miss three or more appointments without notifying the office, you may be dismissed from the clinic at the providers discretion.      For prescription refill requests, have your pharmacy contact our office and allow 72 hours for refills to be completed.    Today you received the following chemotherapy and/or immunotherapy agents Nivolumab and Zometa   To help prevent nausea and vomiting after your treatment, we encourage you to take your nausea medication as directed.  BELOW ARE SYMPTOMS THAT SHOULD BE REPORTED IMMEDIATELY: *FEVER GREATER THAN 100.4 F (38 C) OR HIGHER *CHILLS OR SWEATING *NAUSEA AND VOMITING THAT IS NOT CONTROLLED WITH YOUR NAUSEA MEDICATION *UNUSUAL SHORTNESS OF BREATH *UNUSUAL BRUISING OR BLEEDING *URINARY PROBLEMS (pain or burning when urinating, or frequent urination) *BOWEL PROBLEMS (unusual diarrhea, constipation, pain near the anus) TENDERNESS IN MOUTH AND THROAT WITH OR WITHOUT PRESENCE OF ULCERS (sore throat, sores in mouth, or a toothache) UNUSUAL RASH, SWELLING OR PAIN  UNUSUAL VAGINAL DISCHARGE OR ITCHING   Items with * indicate a potential emergency and should be followed up as soon as possible or go to the Emergency Department if any problems should occur.  Please show the CHEMOTHERAPY ALERT CARD or IMMUNOTHERAPY ALERT CARD at  check-in to the Emergency Department and triage nurse.  Should you have questions after your visit or need to cancel or reschedule your appointment, please contact [facility]  Dept: [phone]  and follow the prompts.  Office hours are 8:00 a.m. to 4:30 p.m. Monday - Friday. Please note that voicemails left after 4:00 p.m. may not be returned until the following business day.  We are closed weekends and major holidays. You have access to a nurse at all times for urgent questions. Please call the main number to the clinic Dept: [phone] and follow the prompts.   For any non-urgent questions, you may also contact your provider using MyChart. We now offer e-Visits for anyone 23 and older to request care online for non-urgent symptoms. For details visit [email or web address].   Also download the MyChart app! Go to the app store, search "MyChart", open the app, select [facility], and log in with your MyChart username and password.  Due to Covid, a mask is required upon entering the hospital/clinic. If you do not have a mask, one will be given to you upon arrival. For doctor visits, patients may have 1 support person aged 75 or older with them. For treatment visits, patients cannot have anyone with them due to current Covid guidelines and our immunocompromised population.

## 2021-07-16 NOTE — Progress Notes (Signed)
Hematology and Oncology Follow Up Visit  Bob Ewing [id number] 06-26-1971 49 y.o. 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   Ipilimumab 1 mg/kg and nivolumab 3 mg/kg cycle 1 given on October 02, 2020.  Completed cycle 4 of therapy on Dec 03, 2020.  Current therapy: Nivolumab 480 mg every 4 weeks.  He presents for a follow-up and next cycle of therapy.  Interim History: Mr. Kinnear returns today for a repeat evaluation.  Since the last visit,     Medications: Reviewed without changes. Current Outpatient Medications  Medication Sig Dispense Refill   aspirin 81 MG chewable tablet Chew 1 tablet (81 mg total) by mouth daily. 90 tablet 0   atenolol (TENORMIN) 50 MG tablet Take 50 mg by mouth daily.     diphenhydrAMINE (BENADRYL) 25 mg capsule Take 1 capsule (25 mg total) by mouth every 6 (six) hours as needed for up to 5 days for itching (rash). 20 capsule 0   hydrochlorothiazide (HYDRODIURIL) 25 MG tablet Take 1 tablet by mouth every morning.     HYDROcodone-acetaminophen (NORCO) 10-325 MG tablet Take 1 tablet by mouth every 4 (four) hours as needed for moderate pain or severe pain.     metFORMIN (GLUCOPHAGE) 500 MG tablet Take 1 tablet by mouth daily.     phentermine 37.5 MG capsule Take 37.5 mg by mouth every morning.     triamcinolone ointment (KENALOG) 0.5 % APPLY TOPICALLY TO THE AFFECTED AREA TWICE DAILY 30 g 0   No current facility-administered medications for this visit.     Allergies:  Allergies  Allergen Reactions   Phentermine     Kidney pain      Physical Exam:    Blood pressure 138/79, pulse 74, temperature  98.1 F (36.7 C), temperature source Tympanic, resp. rate 18, height 5' 7.5" (1.715 m), weight 219 lb 12.8 oz (99.7 kg), SpO2 100 %.     ECOG: 1   General appearance: Comfortable appearing without any discomfort Head: Normocephalic without any trauma Oropharynx: Mucous membranes are moist and pink without any thrush or ulcers. Eyes: Pupils are equal and round reactive to light. Lymph nodes: No cervical, supraclavicular, inguinal or axillary lymphadenopathy.   Heart:regular rate and rhythm.  S1 and S2 without leg edema. Lung: Clear without any rhonchi or wheezes.  No dullness to percussion. Abdomin: Soft, nontender, nondistended with good bowel sounds.  No hepatosplenomegaly. Musculoskeletal: No joint deformity or effusion.  Full range of motion noted. Neurological: No deficits noted on motor, sensory and deep tendon reflex exam. Skin: No petechial rash or dryness.  Appeared moist.             Lab Results: Lab Results  Component Value Date   WBC 9.7 07/16/2021   HGB 13.7 07/16/2021   HCT 39.7 07/16/2021   MCV 90.2 07/16/2021   PLT 295 07/16/2021     Chemistry      Component Value Date/Time   NA 139 06/11/2021 1111   K 3.7 06/11/2021 1111   CL 104 06/11/2021 1111   CO2 24 06/11/2021 1111   BUN 19 06/11/2021 1111   CREATININE 1.15 06/11/2021 1111      Component Value  Date/Time   CALCIUM 9.2 06/11/2021 1111   ALKPHOS 88 06/11/2021 1111   AST 18 06/11/2021 1111   ALT 42 06/11/2021 1111   BILITOT 0.2 (L) 06/11/2021 1111        Impression and Plan:  50 year old man with:   1.  Stage IV clear-cell renal cell carcinoma with rhabdoid features with pulmonary involvement diagnosed in February 2022.   The had an excellent response to immunotherapy without any major complications at this time.  Risks and benefits of continuing this treatment were reviewed.  Alternative treatment options including oral targeted therapy among others were reiterated.  For the time  being he is willing to continue we will update his staging scans in February 2023.  2.  IV access: Peripheral veins are currently in use without any issues.   3.  Antiemetics: No nausea or vomiting reported at this time.  Compazine is available to him.   4.  CNS surveillance: Initial staging scans in March 2022 showed no evidence of involvement of the brain.  This will be updated next year.      5.  Goals of care and prognosis: Therapy remains palliative although aggressive measures are warranted at this time given excellent performance status.   6.  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  7.  Follow-up: In 1 month for repeat follow-up.   30  minutes were dedicated to this encounter.  The time was spent on reviewing laboratory data, disease status update and outlining future plan of care.     Zola Button, MD 12/16/20221:18 PM

## 2021-07-16 NOTE — Progress Notes (Signed)
Ok to give zometa today with calcium of 8.8 per Dr. Alen Blew

## 2021-07-29 ENCOUNTER — Encounter: Payer: Self-pay | Admitting: Oncology

## 2021-08-03 ENCOUNTER — Encounter: Payer: Self-pay | Admitting: Oncology

## 2021-08-11 ENCOUNTER — Telehealth: Payer: Self-pay | Admitting: *Deleted

## 2021-08-11 NOTE — Telephone Encounter (Signed)
PC to patient, informed him authorization team is working on his opdivo authorization but will not be completed in time for Friday's infusion.  Per Dr Alen Blew, we will cancel appointments on 08/13/21 & keep appointments on 09/09/21 as is.  Patient verbalizes understanding.

## 2021-08-12 ENCOUNTER — Telehealth: Payer: Self-pay | Admitting: Oncology

## 2021-08-12 NOTE — Telephone Encounter (Signed)
R/s per 1/11 inbasket, pt aware

## 2021-08-13 ENCOUNTER — Inpatient Hospital Stay: Payer: Commercial Managed Care - HMO

## 2021-08-13 ENCOUNTER — Inpatient Hospital Stay: Payer: Commercial Managed Care - HMO | Admitting: Oncology

## 2021-08-19 ENCOUNTER — Encounter: Payer: Self-pay | Admitting: Oncology

## 2021-08-19 ENCOUNTER — Other Ambulatory Visit: Payer: Self-pay

## 2021-08-19 ENCOUNTER — Inpatient Hospital Stay: Payer: Commercial Managed Care - HMO | Attending: Medical

## 2021-08-19 ENCOUNTER — Inpatient Hospital Stay: Payer: Commercial Managed Care - HMO

## 2021-08-19 VITALS — BP 133/87 | HR 72 | Temp 98.7°F | Resp 16 | Wt 213.2 lb

## 2021-08-19 DIAGNOSIS — Z79899 Other long term (current) drug therapy: Secondary | ICD-10-CM | POA: Insufficient documentation

## 2021-08-19 DIAGNOSIS — Z5112 Encounter for antineoplastic immunotherapy: Secondary | ICD-10-CM | POA: Insufficient documentation

## 2021-08-19 DIAGNOSIS — C649 Malignant neoplasm of unspecified kidney, except renal pelvis: Secondary | ICD-10-CM | POA: Insufficient documentation

## 2021-08-19 DIAGNOSIS — C642 Malignant neoplasm of left kidney, except renal pelvis: Secondary | ICD-10-CM

## 2021-08-19 LAB — CBC WITH DIFFERENTIAL (CANCER CENTER ONLY)
Abs Immature Granulocytes: 0.04 10*3/uL (ref 0.00–0.07)
Basophils Absolute: 0 10*3/uL (ref 0.0–0.1)
Basophils Relative: 0 %
Eosinophils Absolute: 0.1 10*3/uL (ref 0.0–0.5)
Eosinophils Relative: 1 %
HCT: 42.5 % (ref 39.0–52.0)
Hemoglobin: 14.3 g/dL (ref 13.0–17.0)
Immature Granulocytes: 0 %
Lymphocytes Relative: 15 %
Lymphs Abs: 1.5 10*3/uL (ref 0.7–4.0)
MCH: 30.5 pg (ref 26.0–34.0)
MCHC: 33.6 g/dL (ref 30.0–36.0)
MCV: 90.6 fL (ref 80.0–100.0)
Monocytes Absolute: 0.5 10*3/uL (ref 0.1–1.0)
Monocytes Relative: 5 %
Neutro Abs: 7.7 10*3/uL (ref 1.7–7.7)
Neutrophils Relative %: 79 %
Platelet Count: 310 10*3/uL (ref 150–400)
RBC: 4.69 MIL/uL (ref 4.22–5.81)
RDW: 12.8 % (ref 11.5–15.5)
WBC Count: 9.9 10*3/uL (ref 4.0–10.5)
nRBC: 0 % (ref 0.0–0.2)

## 2021-08-19 LAB — CMP (CANCER CENTER ONLY)
ALT: 26 U/L (ref 0–44)
AST: 16 U/L (ref 15–41)
Albumin: 4.3 g/dL (ref 3.5–5.0)
Alkaline Phosphatase: 63 U/L (ref 38–126)
Anion gap: 8 (ref 5–15)
BUN: 18 mg/dL (ref 6–20)
CO2: 26 mmol/L (ref 22–32)
Calcium: 9.3 mg/dL (ref 8.9–10.3)
Chloride: 105 mmol/L (ref 98–111)
Creatinine: 1.32 mg/dL — ABNORMAL HIGH (ref 0.61–1.24)
GFR, Estimated: >60 mL/min (ref >60)
Glucose, Bld: 134 mg/dL — ABNORMAL HIGH (ref 70–99)
Potassium: 3.7 mmol/L (ref 3.5–5.1)
Sodium: 139 mmol/L (ref 135–145)
Total Bilirubin: 0.3 mg/dL (ref 0.3–1.2)
Total Protein: 7.3 g/dL (ref 6.5–8.1)

## 2021-08-19 LAB — TSH: TSH: 12.211 u[IU]/mL — ABNORMAL HIGH (ref 0.320–4.118)

## 2021-08-19 MED ORDER — SODIUM CHLORIDE 0.9 % IV SOLN: Freq: Once | INTRAVENOUS | Status: AC

## 2021-08-19 MED ORDER — SODIUM CHLORIDE 0.9 % IV SOLN
480.0000 mg | Freq: Once | INTRAVENOUS | Status: AC
  Administered 2021-08-19: 480 mg via INTRAVENOUS
  Filled 2021-08-19: qty 48

## 2021-08-19 MED ORDER — ACETAMINOPHEN 325 MG PO TABS
650.0000 mg | ORAL_TABLET | Freq: Once | ORAL | Status: AC
  Administered 2021-08-19: 650 mg via ORAL
  Filled 2021-08-19: qty 2

## 2021-08-19 NOTE — Patient Instructions (Signed)
[facility]  Discharge Instructions: ?Thank you for choosing [facility] to provide your oncology and hematology care.  ? ?If you have a lab appointment with the Cancer Center, please go directly to the Cancer Center and check in at the registration area. ?  ?Wear comfortable clothing and clothing appropriate for easy access to any Portacath or PICC line.  ? ?We strive to give you quality time with your provider. You may need to reschedule your appointment if you arrive late (15 or more minutes).  Arriving late affects you and other patients whose appointments are after yours.  Also, if you miss three or more appointments without notifying the office, you may be dismissed from the clinic at the provider?s discretion.    ?  ?For prescription refill requests, have your pharmacy contact our office and allow 72 hours for refills to be completed.   ? ?Today you received the following chemotherapy and/or immunotherapy agents Opdivo    ?  ?To help prevent nausea and vomiting after your treatment, we encourage you to take your nausea medication as directed. ? ?BELOW ARE SYMPTOMS THAT SHOULD BE REPORTED IMMEDIATELY: ?*FEVER GREATER THAN 100.4 F (38 ?C) OR HIGHER ?*CHILLS OR SWEATING ?*NAUSEA AND VOMITING THAT IS NOT CONTROLLED WITH YOUR NAUSEA MEDICATION ?*UNUSUAL SHORTNESS OF BREATH ?*UNUSUAL BRUISING OR BLEEDING ?*URINARY PROBLEMS (pain or burning when urinating, or frequent urination) ?*BOWEL PROBLEMS (unusual diarrhea, constipation, pain near the anus) ?TENDERNESS IN MOUTH AND THROAT WITH OR WITHOUT PRESENCE OF ULCERS (sore throat, sores in mouth, or a toothache) ?UNUSUAL RASH, SWELLING OR PAIN  ?UNUSUAL VAGINAL DISCHARGE OR ITCHING  ? ?Items with * indicate a potential emergency and should be followed up as soon as possible or go to the Emergency Department if any problems should occur. ? ?Please show the CHEMOTHERAPY ALERT CARD or IMMUNOTHERAPY ALERT CARD at check-in to the  Emergency Department and triage nurse. ? ?Should you have questions after your visit or need to cancel or reschedule your appointment, please contact [facility]  Dept: [phone]  and follow the prompts.  Office hours are 8:00 a.m. to 4:30 p.m. Monday - Friday. Please note that voicemails left after 4:00 p.m. may not be returned until the following business day.  We are closed weekends and major holidays. You have access to a nurse at all times for urgent questions. Please call the main number to the clinic Dept: [phone] and follow the prompts. ? ? ?For any non-urgent questions, you may also contact your provider using MyChart. We now offer e-Visits for anyone 18 and older to request care online for non-urgent symptoms. For details visit [email or web address]. ?  ?Also download the MyChart app! Go to the app store, search "MyChart", open the app, select [facility], and log in with your MyChart username and password. ? ?Due to Covid, a mask is required upon entering the hospital/clinic. If you do not have a mask, one will be given to you upon arrival. For doctor visits, patients may have 1 support person aged 18 or older with them. For treatment visits, patients cannot have anyone with them due to current Covid guidelines and our immunocompromised population.  ? ?

## 2021-08-19 NOTE — Progress Notes (Signed)
Spoke w/ pt regarding reapplying for copay assistance for 2023.  Pt would like to apply so I completed the application w/ [facility] for [place], got his and Dr. Hazeline Junker signature and faxed to [employer] for processing.  I will notify the pt of the outcome once received.

## 2021-08-30 ENCOUNTER — Encounter: Payer: Self-pay | Admitting: Oncology

## 2021-08-30 NOTE — Progress Notes (Signed)
Pt is approved w/ [place] to receive copay assistance for Opdivo from 08/01/21 to 07/31/22.  Pt may pay as little as $0 per medication, per dose.  The program will cover the remainder of the copay, up to a maximum of $25,000 per [facility] medication during the calendar year.

## 2021-09-09 ENCOUNTER — Ambulatory Visit: Payer: 59 | Admitting: Oncology

## 2021-09-09 ENCOUNTER — Ambulatory Visit: Payer: 59

## 2021-09-09 ENCOUNTER — Other Ambulatory Visit: Payer: 59

## 2021-09-17 ENCOUNTER — Encounter: Payer: Self-pay | Admitting: Oncology

## 2021-09-24 ENCOUNTER — Encounter: Payer: Self-pay | Admitting: Oncology

## 2021-09-24 ENCOUNTER — Inpatient Hospital Stay: Payer: 59

## 2021-09-24 ENCOUNTER — Inpatient Hospital Stay (HOSPITAL_BASED_OUTPATIENT_CLINIC_OR_DEPARTMENT_OTHER): Payer: 59 | Admitting: Oncology

## 2021-09-24 ENCOUNTER — Inpatient Hospital Stay: Payer: 59 | Attending: Oncology

## 2021-09-24 ENCOUNTER — Telehealth: Payer: Self-pay | Admitting: *Deleted

## 2021-09-24 ENCOUNTER — Other Ambulatory Visit: Payer: Self-pay

## 2021-09-24 VITALS — BP 128/73 | HR 63 | Temp 97.9°F | Resp 18 | Ht 67.5 in | Wt 218.9 lb

## 2021-09-24 DIAGNOSIS — Z79899 Other long term (current) drug therapy: Secondary | ICD-10-CM | POA: Insufficient documentation

## 2021-09-24 DIAGNOSIS — C642 Malignant neoplasm of left kidney, except renal pelvis: Secondary | ICD-10-CM

## 2021-09-24 DIAGNOSIS — C649 Malignant neoplasm of unspecified kidney, except renal pelvis: Secondary | ICD-10-CM | POA: Insufficient documentation

## 2021-09-24 DIAGNOSIS — Z5112 Encounter for antineoplastic immunotherapy: Secondary | ICD-10-CM | POA: Diagnosis not present

## 2021-09-24 LAB — CBC WITH DIFFERENTIAL (CANCER CENTER ONLY)
Abs Immature Granulocytes: 0.04 10*3/uL (ref 0.00–0.07)
Basophils Absolute: 0.1 10*3/uL (ref 0.0–0.1)
Basophils Relative: 1 %
Eosinophils Absolute: 0.2 10*3/uL (ref 0.0–0.5)
Eosinophils Relative: 2 %
HCT: 41.9 % (ref 39.0–52.0)
Hemoglobin: 14.2 g/dL (ref 13.0–17.0)
Immature Granulocytes: 0 %
Lymphocytes Relative: 18 %
Lymphs Abs: 1.7 10*3/uL (ref 0.7–4.0)
MCH: 31.1 pg (ref 26.0–34.0)
MCHC: 33.9 g/dL (ref 30.0–36.0)
MCV: 91.9 fL (ref 80.0–100.0)
Monocytes Absolute: 0.4 10*3/uL (ref 0.1–1.0)
Monocytes Relative: 5 %
Neutro Abs: 7.2 10*3/uL (ref 1.7–7.7)
Neutrophils Relative %: 74 %
Platelet Count: 294 10*3/uL (ref 150–400)
RBC: 4.56 MIL/uL (ref 4.22–5.81)
RDW: 13.3 % (ref 11.5–15.5)
WBC Count: 9.6 10*3/uL (ref 4.0–10.5)
nRBC: 0 % (ref 0.0–0.2)

## 2021-09-24 LAB — CMP (CANCER CENTER ONLY)
ALT: 35 U/L (ref 0–44)
AST: 19 U/L (ref 15–41)
Albumin: 4.2 g/dL (ref 3.5–5.0)
Alkaline Phosphatase: 61 U/L (ref 38–126)
Anion gap: 6 (ref 5–15)
BUN: 17 mg/dL (ref 6–20)
CO2: 26 mmol/L (ref 22–32)
Calcium: 9.2 mg/dL (ref 8.9–10.3)
Chloride: 105 mmol/L (ref 98–111)
Creatinine: 1.09 mg/dL (ref 0.61–1.24)
GFR, Estimated: >60 mL/min (ref >60)
Glucose, Bld: 145 mg/dL — ABNORMAL HIGH (ref 70–99)
Potassium: 3.7 mmol/L (ref 3.5–5.1)
Sodium: 137 mmol/L (ref 135–145)
Total Bilirubin: 0.3 mg/dL (ref 0.3–1.2)
Total Protein: 7.1 g/dL (ref 6.5–8.1)

## 2021-09-24 LAB — TSH: TSH: 47.439 u[IU]/mL — ABNORMAL HIGH (ref 0.320–4.118)

## 2021-09-24 MED ORDER — SODIUM CHLORIDE 0.9 % IV SOLN: Freq: Once | INTRAVENOUS | Status: AC

## 2021-09-24 MED ORDER — LEVOTHYROXINE SODIUM 50 MCG PO TABS: 50.0000 ug | ORAL_TABLET | Freq: Every day | ORAL | 1 refills | Status: DC

## 2021-09-24 MED ORDER — ACETAMINOPHEN 325 MG PO TABS
650.0000 mg | ORAL_TABLET | Freq: Once | ORAL | Status: AC
  Administered 2021-09-24: 650 mg via ORAL
  Filled 2021-09-24: qty 2

## 2021-09-24 MED ORDER — SODIUM CHLORIDE 0.9 % IV SOLN
480.0000 mg | Freq: Once | INTRAVENOUS | Status: AC
  Administered 2021-09-24: 480 mg via INTRAVENOUS
  Filled 2021-09-24: qty 48

## 2021-09-24 NOTE — Telephone Encounter (Signed)
Per Dr.[name], called pt with message below. Pt verbalized understanding

## 2021-09-24 NOTE — Patient Instructions (Signed)
[facility]  Discharge Instructions: ?Thank you for choosing [facility] to provide your oncology and hematology care.  ? ?If you have a lab appointment with the Cancer Center, please go directly to the Cancer Center and check in at the registration area. ?  ?Wear comfortable clothing and clothing appropriate for easy access to any Portacath or PICC line.  ? ?We strive to give you quality time with your provider. You may need to reschedule your appointment if you arrive late (15 or more minutes).  Arriving late affects you and other patients whose appointments are after yours.  Also, if you miss three or more appointments without notifying the office, you may be dismissed from the clinic at the provider?s discretion.    ?  ?For prescription refill requests, have your pharmacy contact our office and allow 72 hours for refills to be completed.   ? ?Today you received the following chemotherapy and/or immunotherapy agents Opdivo    ?  ?To help prevent nausea and vomiting after your treatment, we encourage you to take your nausea medication as directed. ? ?BELOW ARE SYMPTOMS THAT SHOULD BE REPORTED IMMEDIATELY: ?*FEVER GREATER THAN 100.4 F (38 ?C) OR HIGHER ?*CHILLS OR SWEATING ?*NAUSEA AND VOMITING THAT IS NOT CONTROLLED WITH YOUR NAUSEA MEDICATION ?*UNUSUAL SHORTNESS OF BREATH ?*UNUSUAL BRUISING OR BLEEDING ?*URINARY PROBLEMS (pain or burning when urinating, or frequent urination) ?*BOWEL PROBLEMS (unusual diarrhea, constipation, pain near the anus) ?TENDERNESS IN MOUTH AND THROAT WITH OR WITHOUT PRESENCE OF ULCERS (sore throat, sores in mouth, or a toothache) ?UNUSUAL RASH, SWELLING OR PAIN  ?UNUSUAL VAGINAL DISCHARGE OR ITCHING  ? ?Items with * indicate a potential emergency and should be followed up as soon as possible or go to the Emergency Department if any problems should occur. ? ?Please show the CHEMOTHERAPY ALERT CARD or IMMUNOTHERAPY ALERT CARD at check-in to the  Emergency Department and triage nurse. ? ?Should you have questions after your visit or need to cancel or reschedule your appointment, please contact [facility]  Dept: [phone]  and follow the prompts.  Office hours are 8:00 a.m. to 4:30 p.m. Monday - Friday. Please note that voicemails left after 4:00 p.m. may not be returned until the following business day.  We are closed weekends and major holidays. You have access to a nurse at all times for urgent questions. Please call the main number to the clinic Dept: [phone] and follow the prompts. ? ? ?For any non-urgent questions, you may also contact your provider using MyChart. We now offer e-Visits for anyone 18 and older to request care online for non-urgent symptoms. For details visit [email or web address]. ?  ?Also download the MyChart app! Go to the app store, search "MyChart", open the app, select [facility], and log in with your MyChart username and password. ? ?Due to Covid, a mask is required upon entering the hospital/clinic. If you do not have a mask, one will be given to you upon arrival. For doctor visits, patients may have 1 support person aged 18 or older with them. For treatment visits, patients cannot have anyone with them due to current Covid guidelines and our immunocompromised population.  ? ?

## 2021-09-24 NOTE — Addendum Note (Signed)
Addended by: [name] on: 09/24/2021 02:45 PM   Modules accepted: Orders

## 2021-09-24 NOTE — Progress Notes (Signed)
Hematology and Oncology Follow Up Visit  Bob Ewing [id number] 11-20-1970 51 y.o. 09/24/2021 12:23 PM Bob Ewing, MDShadad, [name], MD   Principle Diagnosis: 30 year old man with kidney cancer diagnosed in February 2022.  He was found to have stage IV clear-cell  with rhabdoid features, pulmonary involvement and subcutaneous nodules.  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   Ipilimumab 1 mg/kg and nivolumab 3 mg/kg cycle 1 given on October 02, 2020.  Completed cycle 4 of therapy on Dec 03, 2020.  Current therapy: Nivolumab 480 mg every 4 weeks.  He returns for evaluation and subsequent cycle of therapy.  Interim History: Bob Ewing presents today for a follow-up visit.  Since last visit, he reports no major changes in his health.  He continues to tolerate nivolumab without any major complaints.  He did develop a mild dermatitis that is manageable with triamcinolone cream.  He denies any nausea, vomiting or abdominal pain.  He denies any diarrhea or excessive fatigue.     Medications: Updated on review. Current Outpatient Medications  Medication Sig Dispense Refill   aspirin 81 MG chewable tablet Chew 1 tablet (81 mg total) by mouth daily. 90 tablet 0   atenolol (TENORMIN) 50 MG tablet Take 50 mg by mouth daily.     diphenhydrAMINE (BENADRYL) 25 mg capsule Take 1 capsule (25 mg total) by mouth every 6 (six) hours as needed for up to 5 days for itching (rash). 20 capsule 0   hydrochlorothiazide (HYDRODIURIL) 25 MG tablet Take 1 tablet by mouth every morning.     HYDROcodone-acetaminophen (NORCO) 10-325 MG tablet Take 1 tablet by mouth every 4 (four) hours as needed for moderate pain or severe pain.     metFORMIN (GLUCOPHAGE) 500 MG tablet Take 1 tablet by mouth daily.     phentermine 37.5 MG capsule Take 37.5 mg by mouth every morning.     triamcinolone  ointment (KENALOG) 0.5 % APPLY TOPICALLY TO THE AFFECTED AREA TWICE DAILY 30 g 0   No current facility-administered medications for this visit.     Allergies:  Allergies  Allergen Reactions   Phentermine     Kidney pain      Physical Exam:     Blood pressure 128/73, pulse 63, temperature 97.9 F (36.6 C), temperature source Temporal, resp. rate 18, height 5' 7.5" (1.715 m), weight 218 lb 14.4 oz (99.3 kg), SpO2 99 %.     ECOG: 1    General appearance: Alert, awake without any distress. Head: Atraumatic without abnormalities Oropharynx: Without any thrush or ulcers. Eyes: No scleral icterus. Lymph nodes: No lymphadenopathy noted in the cervical, supraclavicular, or axillary nodes Heart:regular rate and rhythm, without any murmurs or gallops.   Lung: Clear to auscultation without any rhonchi, wheezes or dullness to percussion. Abdomin: Soft, nontender without any shifting dullness or ascites. Musculoskeletal: No clubbing or cyanosis. Neurological: No motor or sensory deficits. Skin: No rashes or lesions.            Lab Results: Lab Results  Component Value Date   WBC 9.9 08/19/2021   HGB 14.3 08/19/2021   HCT 42.5 08/19/2021   MCV 90.6 08/19/2021   PLT 310 08/19/2021     Chemistry      Component Value Date/Time   NA 139 08/19/2021 0748   K 3.7 08/19/2021 0748   CL 105 08/19/2021 0748  CO2 26 08/19/2021 0748   BUN 18 08/19/2021 0748   CREATININE 1.32 (H) 08/19/2021 0748      Component Value Date/Time   CALCIUM 9.3 08/19/2021 0748   ALKPHOS 63 08/19/2021 0748   AST 16 08/19/2021 0748   ALT 26 08/19/2021 0748   BILITOT 0.3 08/19/2021 0748        Impression and Plan:  51 year old man with:   1.  Kidney cancer diagnosed in February 2022.  He was found to have stage IV clear-cell with rhabdoid features with pulmonary involvement.      His disease status was updated at this time and treatment choices were reviewed.  Risks and benefits  of continuing nivolumab immunotherapy were discussed.  Complications that include GI toxicity, dermatological issues as well as autoimmune complications were reviewed.  He is agreeable to proceed today and will update his staging scans in the next 2 months.   2.  IV access: No complications related to use of peripheral veins.   3.  Antiemetics: Compazine is available to him without any nausea or vomiting.   4.  CNS surveillance: No evidence of metastatic disease in the brain obtained in March 2022.  Continue to monitor and repeat imaging studies as needed.      5.  Goals of care and prognosis: Aggressive measures are warranted given his complete response and young age.   6.  Immune mediated complications: I continue to educate him about potential complications including pneumonitis, colitis and thyroid disease.  His TSH continues to rise and we will replace with supplement.  7.  Follow-up: In 4 weeks for a follow-up visit.   30  minutes were spent on this visit.  The time was dedicated to reviewing laboratory data, disease status update and outlining future plan of care.     Zola Button, MD 2/24/202312:23 PM

## 2021-09-24 NOTE — Progress Notes (Signed)
Pt reports baseline numbness in right hand and a rash on the back of both hands. MD made aware and per Dr.[name] ok to proceed today.

## 2021-09-24 NOTE — Telephone Encounter (Signed)
-----   Message from [name], MD sent at 09/24/2021  2:45 PM EST ----- Please let him know he needs to pick up thyroid medicine like we discussed. I will send RX to his pharmacy

## 2021-10-01 ENCOUNTER — Telehealth: Payer: Self-pay | Admitting: Oncology

## 2021-10-01 NOTE — Telephone Encounter (Signed)
Scheduled per 02/24 los, patient has been called and notified. ?

## 2021-10-22 ENCOUNTER — Inpatient Hospital Stay (HOSPITAL_BASED_OUTPATIENT_CLINIC_OR_DEPARTMENT_OTHER): Payer: 59 | Admitting: Oncology

## 2021-10-22 ENCOUNTER — Inpatient Hospital Stay: Payer: 59

## 2021-10-22 ENCOUNTER — Other Ambulatory Visit: Payer: Self-pay

## 2021-10-22 ENCOUNTER — Inpatient Hospital Stay: Payer: 59 | Attending: Oncology

## 2021-10-22 VITALS — BP 129/78 | HR 73 | Temp 98.0°F | Resp 18 | Ht 67.5 in | Wt 218.9 lb

## 2021-10-22 DIAGNOSIS — C649 Malignant neoplasm of unspecified kidney, except renal pelvis: Secondary | ICD-10-CM | POA: Insufficient documentation

## 2021-10-22 DIAGNOSIS — C642 Malignant neoplasm of left kidney, except renal pelvis: Secondary | ICD-10-CM

## 2021-10-22 DIAGNOSIS — Z5112 Encounter for antineoplastic immunotherapy: Secondary | ICD-10-CM | POA: Insufficient documentation

## 2021-10-22 DIAGNOSIS — Z79899 Other long term (current) drug therapy: Secondary | ICD-10-CM | POA: Insufficient documentation

## 2021-10-22 LAB — CMP (CANCER CENTER ONLY)
ALT: 30 U/L (ref 0–44)
AST: 18 U/L (ref 15–41)
Albumin: 4.2 g/dL (ref 3.5–5.0)
Alkaline Phosphatase: 61 U/L (ref 38–126)
Anion gap: 6 (ref 5–15)
BUN: 24 mg/dL — ABNORMAL HIGH (ref 6–20)
CO2: 28 mmol/L (ref 22–32)
Calcium: 9.5 mg/dL (ref 8.9–10.3)
Chloride: 103 mmol/L (ref 98–111)
Creatinine: 1.28 mg/dL — ABNORMAL HIGH (ref 0.61–1.24)
GFR, Estimated: >60 mL/min (ref >60)
Glucose, Bld: 155 mg/dL — ABNORMAL HIGH (ref 70–99)
Potassium: 3.9 mmol/L (ref 3.5–5.1)
Sodium: 137 mmol/L (ref 135–145)
Total Bilirubin: 0.3 mg/dL (ref 0.3–1.2)
Total Protein: 7 g/dL (ref 6.5–8.1)

## 2021-10-22 LAB — CBC WITH DIFFERENTIAL (CANCER CENTER ONLY)
Abs Immature Granulocytes: 0.03 10*3/uL (ref 0.00–0.07)
Basophils Absolute: 0 10*3/uL (ref 0.0–0.1)
Basophils Relative: 0 %
Eosinophils Absolute: 0.1 10*3/uL (ref 0.0–0.5)
Eosinophils Relative: 1 %
HCT: 42.2 % (ref 39.0–52.0)
Hemoglobin: 14.2 g/dL (ref 13.0–17.0)
Immature Granulocytes: 0 %
Lymphocytes Relative: 20 %
Lymphs Abs: 1.9 10*3/uL (ref 0.7–4.0)
MCH: 31.1 pg (ref 26.0–34.0)
MCHC: 33.6 g/dL (ref 30.0–36.0)
MCV: 92.3 fL (ref 80.0–100.0)
Monocytes Absolute: 0.5 10*3/uL (ref 0.1–1.0)
Monocytes Relative: 5 %
Neutro Abs: 6.9 10*3/uL (ref 1.7–7.7)
Neutrophils Relative %: 74 %
Platelet Count: 291 10*3/uL (ref 150–400)
RBC: 4.57 MIL/uL (ref 4.22–5.81)
RDW: 13.4 % (ref 11.5–15.5)
WBC Count: 9.4 10*3/uL (ref 4.0–10.5)
nRBC: 0 % (ref 0.0–0.2)

## 2021-10-22 LAB — TSH: TSH: 45.953 u[IU]/mL — ABNORMAL HIGH (ref 0.320–4.118)

## 2021-10-22 MED ORDER — SODIUM CHLORIDE 0.9 % IV SOLN
480.0000 mg | Freq: Once | INTRAVENOUS | Status: AC
  Administered 2021-10-22: 480 mg via INTRAVENOUS
  Filled 2021-10-22: qty 48

## 2021-10-22 MED ORDER — SODIUM CHLORIDE 0.9% FLUSH: 10.0000 mL | INTRAVENOUS | Status: DC | PRN

## 2021-10-22 MED ORDER — ACETAMINOPHEN 325 MG PO TABS
650.0000 mg | ORAL_TABLET | Freq: Once | ORAL | Status: AC
  Administered 2021-10-22: 650 mg via ORAL
  Filled 2021-10-22: qty 2

## 2021-10-22 MED ORDER — SODIUM CHLORIDE 0.9 % IV SOLN: Freq: Once | INTRAVENOUS | Status: AC

## 2021-10-22 MED ORDER — HEPARIN SOD (PORK) LOCK FLUSH 100 UNIT/ML IV SOLN: 500.0000 [IU] | Freq: Once | INTRAVENOUS | Status: DC | PRN

## 2021-10-22 NOTE — Patient Instructions (Signed)
[facility]  Discharge Instructions: ?Thank you for choosing [facility] to provide your oncology and hematology care.  ? ?If you have a lab appointment with the Cancer Center, please go directly to the Cancer Center and check in at the registration area. ?  ?Wear comfortable clothing and clothing appropriate for easy access to any Portacath or PICC line.  ? ?We strive to give you quality time with your provider. You may need to reschedule your appointment if you arrive late (15 or more minutes).  Arriving late affects you and other patients whose appointments are after yours.  Also, if you miss three or more appointments without notifying the office, you may be dismissed from the clinic at the provider?s discretion.    ?  ?For prescription refill requests, have your pharmacy contact our office and allow 72 hours for refills to be completed.   ? ?Today you received the following chemotherapy and/or immunotherapy agents Opdivo    ?  ?To help prevent nausea and vomiting after your treatment, we encourage you to take your nausea medication as directed. ? ?BELOW ARE SYMPTOMS THAT SHOULD BE REPORTED IMMEDIATELY: ?*FEVER GREATER THAN 100.4 F (38 ?C) OR HIGHER ?*CHILLS OR SWEATING ?*NAUSEA AND VOMITING THAT IS NOT CONTROLLED WITH YOUR NAUSEA MEDICATION ?*UNUSUAL SHORTNESS OF BREATH ?*UNUSUAL BRUISING OR BLEEDING ?*URINARY PROBLEMS (pain or burning when urinating, or frequent urination) ?*BOWEL PROBLEMS (unusual diarrhea, constipation, pain near the anus) ?TENDERNESS IN MOUTH AND THROAT WITH OR WITHOUT PRESENCE OF ULCERS (sore throat, sores in mouth, or a toothache) ?UNUSUAL RASH, SWELLING OR PAIN  ?UNUSUAL VAGINAL DISCHARGE OR ITCHING  ? ?Items with * indicate a potential emergency and should be followed up as soon as possible or go to the Emergency Department if any problems should occur. ? ?Please show the CHEMOTHERAPY ALERT CARD or IMMUNOTHERAPY ALERT CARD at check-in to the  Emergency Department and triage nurse. ? ?Should you have questions after your visit or need to cancel or reschedule your appointment, please contact [facility]  Dept: [phone]  and follow the prompts.  Office hours are 8:00 a.m. to 4:30 p.m. Monday - Friday. Please note that voicemails left after 4:00 p.m. may not be returned until the following business day.  We are closed weekends and major holidays. You have access to a nurse at all times for urgent questions. Please call the main number to the clinic Dept: [phone] and follow the prompts. ? ? ?For any non-urgent questions, you may also contact your provider using MyChart. We now offer e-Visits for anyone 18 and older to request care online for non-urgent symptoms. For details visit [email or web address]. ?  ?Also download the MyChart app! Go to the app store, search "MyChart", open the app, select [facility], and log in with your MyChart username and password. ? ?Due to Covid, a mask is required upon entering the hospital/clinic. If you do not have a mask, one will be given to you upon arrival. For doctor visits, patients may have 1 support person aged 18 or older with them. For treatment visits, patients cannot have anyone with them due to current Covid guidelines and our immunocompromised population.  ? ?

## 2021-10-22 NOTE — Progress Notes (Signed)
Hematology and Oncology Follow Up Visit ? ?Bob Ewing ?[id number] ?Sep 06, 1970 51 y.o. ?10/22/2021 11:28 AM ?Bob Ewing, MDShadad, [name], MD  ? 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 ? ?Prior Therapy:  ? ?He is status post renal biopsy completed on September 14, 2020 which confirmed the presence of clear cell renal cell carcinoma with rhabdoid features. ? ? ?Ipilimumab 1 mg/kg and nivolumab 3 mg/kg cycle 1 given on October 02, 2020.  Completed cycle 4 of therapy on Dec 03, 2020. ? ?Current therapy: Nivolumab 480 mg every 4 weeks.  He is here for the next cycle of therapy. ? ?Interim History: Bob Ewing returns today for a follow-up evaluation.  Since her last visit, he reports no major changes in his health.  He denies any nausea, vomiting or abdominal pain.  He denies any hospitalizations or illnesses.  He denies any diarrhea or respiratory complaints.  He denies any changes in his mentation.  His performance status quality of life remain excellent. ? ? ? ? ?Medications: Updated on review. ?Current Outpatient Medications  ?Medication Sig Dispense Refill  ? aspirin 81 MG chewable tablet Chew 1 tablet (81 mg total) by mouth daily. 90 tablet 0  ? atenolol (TENORMIN) 50 MG tablet Take 50 mg by mouth daily.    ? diphenhydrAMINE (BENADRYL) 25 mg capsule Take 1 capsule (25 mg total) by mouth every 6 (six) hours as needed for up to 5 days for itching (rash). 20 capsule 0  ? hydrochlorothiazide (HYDRODIURIL) 25 MG tablet Take 1 tablet by mouth every morning.    ? HYDROcodone-acetaminophen (NORCO) 10-325 MG tablet Take 1 tablet by mouth every 4 (four) hours as needed for moderate pain or severe pain.    ? levothyroxine (SYNTHROID) 50 MCG tablet Take 1 tablet (50 mcg total) by mouth daily before breakfast. 60 tablet 1  ? metFORMIN (GLUCOPHAGE) 500 MG tablet Take 1 tablet by mouth daily.    ? phentermine 37.5  MG capsule Take 37.5 mg by mouth every morning.    ? triamcinolone ointment (KENALOG) 0.5 % APPLY TOPICALLY TO THE AFFECTED AREA TWICE DAILY 30 g 0  ? ?No current facility-administered medications for this visit.  ? ? ? ?Allergies:  ?Allergies  ?Allergen Reactions  ? Phentermine   ?  Kidney pain  ? ? ? ? ?Physical Exam: ? ? ? ? ?Blood pressure 129/78, pulse 73, temperature 98 ?F (36.7 ?C), temperature source Temporal, resp. rate 18, height 5' 7.5" (1.715 m), weight 218 lb 14.4 oz (99.3 kg), SpO2 100 %. ? ? ? ? ? ?ECOG: 1 ? ? ?General appearance: Comfortable appearing without any discomfort ?Head: Normocephalic without any trauma ?Oropharynx: Mucous membranes are moist and pink without any thrush or ulcers. ?Eyes: Pupils are equal and round reactive to light. ?Lymph nodes: No cervical, supraclavicular, inguinal or axillary lymphadenopathy.   ?Heart:regular rate and rhythm.  S1 and S2 without leg edema. ?Lung: Clear without any rhonchi or wheezes.  No dullness to percussion. ?Abdomin: Soft, nontender, nondistended with good bowel sounds.  No hepatosplenomegaly. ?Musculoskeletal: No joint deformity or effusion.  Full range of motion noted. ?Neurological: No deficits noted on motor, sensory and deep tendon reflex exam. ?Skin: No petechial rash or dryness.  Appeared moist.  ? ? ? ? ? ? ? ? ? ? ? ?Lab Results: ?Lab Results  ?Component Value Date  ? WBC 9.4 10/22/2021  ? HGB 14.2 10/22/2021  ?  HCT 42.2 10/22/2021  ? MCV 92.3 10/22/2021  ? PLT 291 10/22/2021  ? ?  Chemistry   ?   ?Component Value Date/Time  ? NA 137 09/24/2021 1245  ? K 3.7 09/24/2021 1245  ? CL 105 09/24/2021 1245  ? CO2 26 09/24/2021 1245  ? BUN 17 09/24/2021 1245  ? CREATININE 1.09 09/24/2021 1245  ?    ?Component Value Date/Time  ? CALCIUM 9.2 09/24/2021 1245  ? ALKPHOS 61 09/24/2021 1245  ? AST 19 09/24/2021 1245  ? ALT 35 09/24/2021 1245  ? BILITOT 0.3 09/24/2021 1245  ?  ? ? ? ? ?Impression and Plan: ? ?51 year old man with: ?  ?Stage IV clear-cell  renal cell carcinoma with rhabdoid features with pulmonary involvement diagnosed in 2022. ?  ? ?Risks and benefits of continuing this treatment were reviewed at this time.  Complications that include GI toxicity, pulmonary issues as well as autoimmune complications in general.  For the time being he is agreeable to continue.  Plan is to update his staging scan in the next 4 to 8 weeks.  He would like to defer the CT scan as long as possible for financial reasons. ? ? ?2.  IV access: Peripheral veins are currently in use without any issues. ?  ?3.  Antiemetics: No nausea or vomiting reported at this time Compazine is available to him. ?  ?4.  CNS surveillance: Imaging studies in March 2022 did not show any evidence of disease.  We will repeat imaging studies if he develop any neurological symptoms. ?  ? ?  ?5.  Goals of care and prognosis: Therapy remains palliative although aggressive measures are warranted. ? ? ?6.  Immune mediated complications: He developed hypothyroidism and currently on thyroid replacement.  We will continue to monitor and adjust as needed.  Other complication occluding colitis hepatitis among others were reiterated. ? ?7.  Follow-up:  he will return in 1 month for the next cycle of therapy. ?  ?30  minutes were dedicated to this encounter.  This time was spent on reviewing laboratory data, disease status update outlining future plan of care discussion. ?  ? ? ?Zola Button, MD ?3/24/202311:28 AM ? ?

## 2021-11-19 ENCOUNTER — Inpatient Hospital Stay: Payer: 59

## 2021-11-19 ENCOUNTER — Other Ambulatory Visit: Payer: Self-pay

## 2021-11-19 ENCOUNTER — Inpatient Hospital Stay (HOSPITAL_BASED_OUTPATIENT_CLINIC_OR_DEPARTMENT_OTHER): Payer: 59 | Admitting: Oncology

## 2021-11-19 ENCOUNTER — Inpatient Hospital Stay: Payer: 59 | Attending: Oncology

## 2021-11-19 VITALS — BP 109/78 | HR 62 | Temp 97.8°F | Resp 18 | Ht 67.5 in | Wt 221.7 lb

## 2021-11-19 DIAGNOSIS — C778 Secondary and unspecified malignant neoplasm of lymph nodes of multiple regions: Secondary | ICD-10-CM | POA: Diagnosis not present

## 2021-11-19 DIAGNOSIS — Z79899 Other long term (current) drug therapy: Secondary | ICD-10-CM | POA: Insufficient documentation

## 2021-11-19 DIAGNOSIS — C642 Malignant neoplasm of left kidney, except renal pelvis: Secondary | ICD-10-CM | POA: Diagnosis present

## 2021-11-19 DIAGNOSIS — Z5112 Encounter for antineoplastic immunotherapy: Secondary | ICD-10-CM | POA: Insufficient documentation

## 2021-11-19 DIAGNOSIS — C7800 Secondary malignant neoplasm of unspecified lung: Secondary | ICD-10-CM | POA: Diagnosis not present

## 2021-11-19 DIAGNOSIS — C649 Malignant neoplasm of unspecified kidney, except renal pelvis: Secondary | ICD-10-CM | POA: Diagnosis not present

## 2021-11-19 LAB — CMP (CANCER CENTER ONLY)
ALT: 30 U/L (ref 0–44)
AST: 18 U/L (ref 15–41)
Albumin: 4.2 g/dL (ref 3.5–5.0)
Alkaline Phosphatase: 62 U/L (ref 38–126)
Anion gap: 8 (ref 5–15)
BUN: 24 mg/dL — ABNORMAL HIGH (ref 6–20)
CO2: 25 mmol/L (ref 22–32)
Calcium: 9.1 mg/dL (ref 8.9–10.3)
Chloride: 104 mmol/L (ref 98–111)
Creatinine: 1.28 mg/dL — ABNORMAL HIGH (ref 0.61–1.24)
GFR, Estimated: >60 mL/min (ref >60)
Glucose, Bld: 127 mg/dL — ABNORMAL HIGH (ref 70–99)
Potassium: 3.8 mmol/L (ref 3.5–5.1)
Sodium: 137 mmol/L (ref 135–145)
Total Bilirubin: 0.3 mg/dL (ref 0.3–1.2)
Total Protein: 7.1 g/dL (ref 6.5–8.1)

## 2021-11-19 LAB — TSH: TSH: 91.85 u[IU]/mL — ABNORMAL HIGH (ref 0.350–4.500)

## 2021-11-19 LAB — CBC WITH DIFFERENTIAL (CANCER CENTER ONLY)
Abs Immature Granulocytes: 0.04 10*3/uL (ref 0.00–0.07)
Basophils Absolute: 0 10*3/uL (ref 0.0–0.1)
Basophils Relative: 1 %
Eosinophils Absolute: 0.1 10*3/uL (ref 0.0–0.5)
Eosinophils Relative: 2 %
HCT: 40.1 % (ref 39.0–52.0)
Hemoglobin: 13.6 g/dL (ref 13.0–17.0)
Immature Granulocytes: 1 %
Lymphocytes Relative: 24 %
Lymphs Abs: 2 10*3/uL (ref 0.7–4.0)
MCH: 31.6 pg (ref 26.0–34.0)
MCHC: 33.9 g/dL (ref 30.0–36.0)
MCV: 93 fL (ref 80.0–100.0)
Monocytes Absolute: 0.5 10*3/uL (ref 0.1–1.0)
Monocytes Relative: 5 %
Neutro Abs: 6 10*3/uL (ref 1.7–7.7)
Neutrophils Relative %: 67 %
Platelet Count: 293 10*3/uL (ref 150–400)
RBC: 4.31 MIL/uL (ref 4.22–5.81)
RDW: 13.2 % (ref 11.5–15.5)
WBC Count: 8.7 10*3/uL (ref 4.0–10.5)
nRBC: 0 % (ref 0.0–0.2)

## 2021-11-19 MED ORDER — SODIUM CHLORIDE 0.9 % IV SOLN: Freq: Once | INTRAVENOUS | Status: AC

## 2021-11-19 MED ORDER — ACETAMINOPHEN 325 MG PO TABS
650.0000 mg | ORAL_TABLET | Freq: Once | ORAL | Status: AC
  Administered 2021-11-19: 650 mg via ORAL
  Filled 2021-11-19: qty 2

## 2021-11-19 MED ORDER — SODIUM CHLORIDE 0.9 % IV SOLN
480.0000 mg | Freq: Once | INTRAVENOUS | Status: AC
  Administered 2021-11-19: 480 mg via INTRAVENOUS
  Filled 2021-11-19: qty 48

## 2021-11-19 NOTE — Progress Notes (Signed)
Hematology and Oncology Follow Up Visit ? ?Bob Ewing ?[id number] ?1971/03/22 51 y.o. ?11/19/2021 12:32 PM ?[name], MDShadad, [name], MD  ? ?Principle Diagnosis: 28 year old man with kidney cancer diagnosed in February 2022.  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 ? ? ?Prior Therapy:  ? ?He is status post renal biopsy completed on September 14, 2020 which confirmed the presence of clear cell renal cell carcinoma with rhabdoid features. ? ? ?Ipilimumab 1 mg/kg and nivolumab 3 mg/kg cycle 1 given on October 02, 2020.  Completed cycle 4 of therapy on Dec 03, 2020. ? ?Current therapy: Nivolumab 480 mg every 4 weeks.  He is here for the next cycle of therapy. ? ?Interim History: Bob Ewing is here for repeat evaluation.  Since the last visit, he reports feeling well without any major complaints.  He denies any recent hospitalizations or illnesses.  He continues to tolerate nivolumab very well.  He denies excessive fatigue, tiredness or hair changes.  He denies any shortness of breath or difficulty breathing.  His performance status and quality of life remains unchanged. ? ? ? ? ?Medications: Reviewed without changes. ?Current Outpatient Medications  ?Medication Sig Dispense Refill  ? aspirin 81 MG chewable tablet Chew 1 tablet (81 mg total) by mouth daily. 90 tablet 0  ? atenolol (TENORMIN) 50 MG tablet Take 50 mg by mouth daily.    ? hydrochlorothiazide (HYDRODIURIL) 25 MG tablet Take 1 tablet by mouth every morning.    ? HYDROcodone-acetaminophen (NORCO) 10-325 MG tablet Take 1 tablet by mouth every 4 (four) hours as needed for moderate pain or severe pain.    ? levothyroxine (SYNTHROID) 50 MCG tablet Take 1 tablet (50 mcg total) by mouth daily before breakfast. 60 tablet 1  ? metFORMIN (GLUCOPHAGE) 500 MG tablet Take 1 tablet by mouth daily.    ? ?No current facility-administered medications for this visit.   ? ? ? ?Allergies:  ?No Active Allergies ? ? ? ? ?Physical Exam: ? ? ? ?Blood pressure 109/78, pulse 62, temperature 97.8 ?F (36.6 ?C), temperature source Temporal, resp. rate 18, height 5' 7.5" (1.715 m), weight 221 lb 11.2 oz (100.6 kg), SpO2 100 %. ? ? ? ? ? ? ? ?ECOG: 1 ? ? ? ?General appearance: Alert, awake without any distress. ?Head: Atraumatic without abnormalities ?Oropharynx: Without any thrush or ulcers. ?Eyes: No scleral icterus. ?Lymph nodes: No lymphadenopathy noted in the cervical, supraclavicular, or axillary nodes ?Heart:regular rate and rhythm, without any murmurs or gallops.   ?Lung: Clear to auscultation without any rhonchi, wheezes or dullness to percussion. ?Abdomin: Soft, nontender without any shifting dullness or ascites. ?Musculoskeletal: No clubbing or cyanosis. ?Neurological: No motor or sensory deficits. ?Skin: No rashes or lesions. ? ? ? ? ? ? ? ? ? ? ? ?Lab Results: ?Lab Results  ?Component Value Date  ? WBC 9.4 10/22/2021  ? HGB 14.2 10/22/2021  ? HCT 42.2 10/22/2021  ? MCV 92.3 10/22/2021  ? PLT 291 10/22/2021  ? ?  Chemistry   ?   ?Component Value Date/Time  ? NA 137 10/22/2021 1106  ? K 3.9 10/22/2021 1106  ? CL 103 10/22/2021 1106  ? CO2 28 10/22/2021 1106  ? BUN 24 (H) 10/22/2021 1106  ? CREATININE 1.28 (H) 10/22/2021 1106  ?    ?Component Value Date/Time  ? CALCIUM 9.5 10/22/2021 1106  ? ALKPHOS 61 10/22/2021 1106  ? AST  18 10/22/2021 1106  ? ALT 30 10/22/2021 1106  ? BILITOT 0.3 10/22/2021 1106  ?  ? ? ? ? ?Impression and Plan: ? ?51 year old man with: ?  ?Kidney cancer diagnosed in 2022. 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 ?  ? ?He continues to tolerate the nivolumab without any major complications.  Risks and benefits of continuing this treatment were reviewed at this time.  Complications that include immune mediated issues, GI toxicity and skin rash were reviewed.  Different salvage therapy options  including oral targeted therapy will be deferred unless he has relapsed disease.  Plan is to update his staging scans in the future.  He opted to defer the CT scan scheduling for the time being. ? ? ?2.  IV access: No complications related to peripheral vein usage.  Port-A-Cath option will be deferred. ?  ?3.  Antiemetics: Compazine is available to him without any nausea or vomiting. ?  ?4.  CNS surveillance: No evidence of CNS involvement noted.  This will be repeated in case of progression. ?  ? ?  ?5.  Goals of care and prognosis: Aggressive measures are warranted at this time.  Pulm status is adequate. ? ? ?6.  Immune mediated complications: I continue to educate him about potential complications including pneumonitis, colitis and hypophysitis.  He continues to be on thyroid replacement. ? ?7.  Follow-up: In 4 weeks for repeat follow-up. ?  ?30  minutes were spent on this visit.  The time was dedicated to reviewing laboratory data, disease status update and outlining future plan of care review. ?  ? ? ?Zola Button, MD ?4/21/202312:32 PM ? ?

## 2021-11-19 NOTE — Patient Instructions (Signed)
[facility]  Discharge Instructions: ?Thank you for choosing [facility] to provide your oncology and hematology care.  ? ?If you have a lab appointment with the Cancer Center, please go directly to the Cancer Center and check in at the registration area. ?  ?Wear comfortable clothing and clothing appropriate for easy access to any Portacath or PICC line.  ? ?We strive to give you quality time with your provider. You may need to reschedule your appointment if you arrive late (15 or more minutes).  Arriving late affects you and other patients whose appointments are after yours.  Also, if you miss three or more appointments without notifying the office, you may be dismissed from the clinic at the provider?s discretion.    ?  ?For prescription refill requests, have your pharmacy contact our office and allow 72 hours for refills to be completed.   ? ?Today you received the following chemotherapy and/or immunotherapy agents Opdivo    ?  ?To help prevent nausea and vomiting after your treatment, we encourage you to take your nausea medication as directed. ? ?BELOW ARE SYMPTOMS THAT SHOULD BE REPORTED IMMEDIATELY: ?*FEVER GREATER THAN 100.4 F (38 ?C) OR HIGHER ?*CHILLS OR SWEATING ?*NAUSEA AND VOMITING THAT IS NOT CONTROLLED WITH YOUR NAUSEA MEDICATION ?*UNUSUAL SHORTNESS OF BREATH ?*UNUSUAL BRUISING OR BLEEDING ?*URINARY PROBLEMS (pain or burning when urinating, or frequent urination) ?*BOWEL PROBLEMS (unusual diarrhea, constipation, pain near the anus) ?TENDERNESS IN MOUTH AND THROAT WITH OR WITHOUT PRESENCE OF ULCERS (sore throat, sores in mouth, or a toothache) ?UNUSUAL RASH, SWELLING OR PAIN  ?UNUSUAL VAGINAL DISCHARGE OR ITCHING  ? ?Items with * indicate a potential emergency and should be followed up as soon as possible or go to the Emergency Department if any problems should occur. ? ?Please show the CHEMOTHERAPY ALERT CARD or IMMUNOTHERAPY ALERT CARD at check-in to the  Emergency Department and triage nurse. ? ?Should you have questions after your visit or need to cancel or reschedule your appointment, please contact [facility]  Dept: [phone]  and follow the prompts.  Office hours are 8:00 a.m. to 4:30 p.m. Monday - Friday. Please note that voicemails left after 4:00 p.m. may not be returned until the following business day.  We are closed weekends and major holidays. You have access to a nurse at all times for urgent questions. Please call the main number to the clinic Dept: [phone] and follow the prompts. ? ? ?For any non-urgent questions, you may also contact your provider using MyChart. We now offer e-Visits for anyone 18 and older to request care online for non-urgent symptoms. For details visit [email or web address]. ?  ?Also download the MyChart app! Go to the app store, search "MyChart", open the app, select [facility], and log in with your MyChart username and password. ? ?Due to Covid, a mask is required upon entering the hospital/clinic. If you do not have a mask, one will be given to you upon arrival. For doctor visits, patients may have 1 support person aged 18 or older with them. For treatment visits, patients cannot have anyone with them due to current Covid guidelines and our immunocompromised population.  ? ?

## 2021-11-22 ENCOUNTER — Telehealth: Payer: Self-pay

## 2021-11-22 MED ORDER — LEVOTHYROXINE SODIUM 50 MCG PO TABS: 100.0000 ug | ORAL_TABLET | Freq: Every day | ORAL | 1 refills | Status: DC

## 2021-11-22 NOTE — Addendum Note (Signed)
Addended by: [name] on: 11/22/2021 07:54 AM ? ? Modules accepted: Orders ? ?

## 2021-11-22 NOTE — Telephone Encounter (Signed)
Pt advised and voiced understanding.   

## 2021-11-22 NOTE — Telephone Encounter (Signed)
-----   Message from [name], MD sent at 11/22/2021  9:20 AM EDT ----- ?Regarding: Increase Synthroid ?Please let him know he needs to take two synthroid tabs rather than one (100 mcg total dose). Thanks  ? ?

## 2021-12-01 ENCOUNTER — Telehealth: Payer: Self-pay | Admitting: Oncology

## 2021-12-01 NOTE — Telephone Encounter (Signed)
Called patient regarding upcoming May and June appointments, patient is notified. ?

## 2021-12-17 ENCOUNTER — Inpatient Hospital Stay (HOSPITAL_BASED_OUTPATIENT_CLINIC_OR_DEPARTMENT_OTHER): Payer: 59 | Admitting: Oncology

## 2021-12-17 ENCOUNTER — Inpatient Hospital Stay: Payer: 59

## 2021-12-17 ENCOUNTER — Inpatient Hospital Stay: Payer: 59 | Attending: Oncology

## 2021-12-17 ENCOUNTER — Other Ambulatory Visit: Payer: Self-pay

## 2021-12-17 VITALS — BP 132/81 | HR 80 | Temp 97.8°F | Resp 17 | Ht 67.5 in | Wt 224.3 lb

## 2021-12-17 DIAGNOSIS — C642 Malignant neoplasm of left kidney, except renal pelvis: Secondary | ICD-10-CM

## 2021-12-17 DIAGNOSIS — Z5112 Encounter for antineoplastic immunotherapy: Secondary | ICD-10-CM | POA: Diagnosis not present

## 2021-12-17 DIAGNOSIS — C649 Malignant neoplasm of unspecified kidney, except renal pelvis: Secondary | ICD-10-CM | POA: Diagnosis not present

## 2021-12-17 DIAGNOSIS — E039 Hypothyroidism, unspecified: Secondary | ICD-10-CM | POA: Insufficient documentation

## 2021-12-17 DIAGNOSIS — C778 Secondary and unspecified malignant neoplasm of lymph nodes of multiple regions: Secondary | ICD-10-CM

## 2021-12-17 LAB — CBC WITH DIFFERENTIAL (CANCER CENTER ONLY)
Abs Immature Granulocytes: 0.05 10*3/uL (ref 0.00–0.07)
Basophils Absolute: 0 10*3/uL (ref 0.0–0.1)
Basophils Relative: 0 %
Eosinophils Absolute: 0.2 10*3/uL (ref 0.0–0.5)
Eosinophils Relative: 2 %
HCT: 40.1 % (ref 39.0–52.0)
Hemoglobin: 13.9 g/dL (ref 13.0–17.0)
Immature Granulocytes: 1 %
Lymphocytes Relative: 21 %
Lymphs Abs: 1.8 10*3/uL (ref 0.7–4.0)
MCH: 32.2 pg (ref 26.0–34.0)
MCHC: 34.7 g/dL (ref 30.0–36.0)
MCV: 92.8 fL (ref 80.0–100.0)
Monocytes Absolute: 0.5 10*3/uL (ref 0.1–1.0)
Monocytes Relative: 5 %
Neutro Abs: 6.1 10*3/uL (ref 1.7–7.7)
Neutrophils Relative %: 71 %
Platelet Count: 246 10*3/uL (ref 150–400)
RBC: 4.32 MIL/uL (ref 4.22–5.81)
RDW: 12.6 % (ref 11.5–15.5)
WBC Count: 8.6 10*3/uL (ref 4.0–10.5)
nRBC: 0 % (ref 0.0–0.2)

## 2021-12-17 LAB — CMP (CANCER CENTER ONLY)
ALT: 30 U/L (ref 0–44)
AST: 19 U/L (ref 15–41)
Albumin: 4.2 g/dL (ref 3.5–5.0)
Alkaline Phosphatase: 59 U/L (ref 38–126)
Anion gap: 7 (ref 5–15)
BUN: 23 mg/dL — ABNORMAL HIGH (ref 6–20)
CO2: 26 mmol/L (ref 22–32)
Calcium: 9.3 mg/dL (ref 8.9–10.3)
Chloride: 104 mmol/L (ref 98–111)
Creatinine: 1.29 mg/dL — ABNORMAL HIGH (ref 0.61–1.24)
GFR, Estimated: >60 mL/min (ref >60)
Glucose, Bld: 120 mg/dL — ABNORMAL HIGH (ref 70–99)
Potassium: 3.9 mmol/L (ref 3.5–5.1)
Sodium: 137 mmol/L (ref 135–145)
Total Bilirubin: 0.3 mg/dL (ref 0.3–1.2)
Total Protein: 7.3 g/dL (ref 6.5–8.1)

## 2021-12-17 LAB — TSH: TSH: 37.745 u[IU]/mL — ABNORMAL HIGH (ref 0.350–4.500)

## 2021-12-17 MED ORDER — SODIUM CHLORIDE 0.9 % IV SOLN
480.0000 mg | Freq: Once | INTRAVENOUS | Status: AC
  Administered 2021-12-17: 480 mg via INTRAVENOUS
  Filled 2021-12-17: qty 48

## 2021-12-17 MED ORDER — ACETAMINOPHEN 325 MG PO TABS
650.0000 mg | ORAL_TABLET | Freq: Once | ORAL | Status: AC
  Administered 2021-12-17: 650 mg via ORAL
  Filled 2021-12-17: qty 2

## 2021-12-17 MED ORDER — SODIUM CHLORIDE 0.9 % IV SOLN: Freq: Once | INTRAVENOUS | Status: AC

## 2021-12-17 NOTE — Progress Notes (Signed)
Hematology and Oncology Follow Up Visit  Bob Ewing [id number] Feb 21, 1971 51 y.o. 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   Ipilimumab 1 mg/kg and nivolumab 3 mg/kg cycle 1 given on October 02, 2020.  Completed cycle 4 of therapy on Dec 03, 2020.  Current therapy: Nivolumab 480 mg every 4 weeks.  Returns for the subsequent cycle of treatment.  Interim History: Bob Ewing returns today for a follow-up visit.  Since last visit, he reports feeling well without any major complaints.  He denies any recent hospitalizations or illnesses.  He denies any nausea, fatigue or skin rash.  He continues to gain weight and smokes heavily.     Medications: Updated on review. Current Outpatient Medications  Medication Sig Dispense Refill   aspirin 81 MG chewable tablet Chew 1 tablet (81 mg total) by mouth daily. 90 tablet 0   atenolol (TENORMIN) 50 MG tablet Take 50 mg by mouth daily.     hydrochlorothiazide (HYDRODIURIL) 25 MG tablet Take 1 tablet by mouth every morning.     HYDROcodone-acetaminophen (NORCO) 10-325 MG tablet Take 1 tablet by mouth every 4 (four) hours as needed for moderate pain or severe pain.     levothyroxine (SYNTHROID) 50 MCG tablet Take 2 tablets (100 mcg total) by mouth daily before breakfast. 60 tablet 1   metFORMIN (GLUCOPHAGE) 500 MG tablet Take 1 tablet by mouth daily.     No current facility-administered medications for this visit.     Allergies:  No Active Allergies     Physical Exam:    Blood pressure 132/81, pulse 80, temperature 97.8 F (36.6 C), temperature source Temporal, resp. rate 17, height 5' 7.5" (1.715 m), weight 224 lb 4.8  oz (101.7 kg), SpO2 100 %.        ECOG: 1   General appearance: Comfortable appearing without any discomfort Head: Normocephalic without any trauma Oropharynx: Mucous membranes are moist and pink without any thrush or ulcers. Eyes: Pupils are equal and round reactive to light. Lymph nodes: No cervical, supraclavicular, inguinal or axillary lymphadenopathy.   Heart:regular rate and rhythm.  S1 and S2 without leg edema. Lung: Clear without any rhonchi or wheezes.  No dullness to percussion. Abdomin: Soft, nontender, nondistended with good bowel sounds.  No hepatosplenomegaly. Musculoskeletal: No joint deformity or effusion.  Full range of motion noted. Neurological: No deficits noted on motor, sensory and deep tendon reflex exam. Skin: No petechial rash or dryness.  Appeared moist.             Lab Results: Lab Results  Component Value Date   WBC 8.6 12/17/2021   HGB 13.9 12/17/2021   HCT 40.1 12/17/2021   MCV 92.8 12/17/2021   PLT 246 12/17/2021     Chemistry      Component Value Date/Time   NA 137 11/19/2021 1236   K 3.8 11/19/2021 1236   CL 104 11/19/2021 1236   CO2 25 11/19/2021 1236   BUN 24 (H) 11/19/2021 1236   CREATININE 1.28 (H) 11/19/2021 1236      Component Value Date/Time   CALCIUM 9.1 11/19/2021 1236   ALKPHOS 62 11/19/2021 1236   AST 18 11/19/2021 1236  ALT 30 11/19/2021 1236   BILITOT 0.3 11/19/2021 1236        Impression and Plan:  51 year old man with:   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   The natural course of his disease was reviewed at this time and treatment options were discussed.  Risks and benefits of continuing nivolumab therapy were reviewed.  The role for salvage nephrectomy were discussed at this time.  I recommended updating his staging scans before consideration for a salvage surgical option.  At this time he is agreeable to continue nivolumab  and consider staging scans in the future.  If his next scan showed a complete response to therapy we will consider surgical intervention.   2.  IV access: Port-A-Cath option has been deferred currently using peripheral veins.   3.  Antiemetics: No nausea or vomiting reported at this time.  Compazine is available to him.   4.  CNS surveillance: Initial imaging did not show any evidence of metastatic disease.  No signs or symptoms of CNS progression.      5.  Goals of care and prognosis: Therapy remains palliative although aggressive measures are warranted given his excellent response.   6.  Immune mediated complications: He continues to have issues with hypothyroidism and his thyroid dosing is increased.  We will continue to monitor.  7.  Follow-up: He will follow-up in 4 weeks for the next cycle of therapy.   30  minutes were spent on this encounter.  Time was dedicated to review.  Data, disease status update, treatment choices and addressing complication related therapy.     Zola Button, MD 5/19/202311:57 AM

## 2021-12-19 ENCOUNTER — Other Ambulatory Visit: Payer: Self-pay | Admitting: Oncology

## 2021-12-21 ENCOUNTER — Encounter: Payer: Self-pay | Admitting: Oncology

## 2021-12-21 MED ORDER — ATENOLOL 50 MG PO TABS: 50.0000 mg | ORAL_TABLET | Freq: Every day | ORAL | 1 refills | Status: DC

## 2021-12-21 MED ORDER — HYDROCHLOROTHIAZIDE 25 MG PO TABS: 25.0000 mg | ORAL_TABLET | Freq: Every morning | ORAL | 1 refills | Status: DC

## 2021-12-29 ENCOUNTER — Encounter: Payer: Self-pay | Admitting: Oncology

## 2022-01-07 ENCOUNTER — Encounter: Payer: Self-pay | Admitting: Oncology

## 2022-01-14 ENCOUNTER — Inpatient Hospital Stay (HOSPITAL_BASED_OUTPATIENT_CLINIC_OR_DEPARTMENT_OTHER): Payer: 59 | Admitting: Oncology

## 2022-01-14 ENCOUNTER — Inpatient Hospital Stay: Payer: 59

## 2022-01-14 ENCOUNTER — Other Ambulatory Visit: Payer: Self-pay

## 2022-01-14 ENCOUNTER — Inpatient Hospital Stay: Payer: 59 | Attending: Oncology

## 2022-01-14 VITALS — BP 140/84 | HR 74 | Temp 97.6°F | Resp 18 | Ht 67.5 in | Wt 221.5 lb

## 2022-01-14 DIAGNOSIS — Z79899 Other long term (current) drug therapy: Secondary | ICD-10-CM | POA: Insufficient documentation

## 2022-01-14 DIAGNOSIS — I10 Essential (primary) hypertension: Secondary | ICD-10-CM | POA: Diagnosis not present

## 2022-01-14 DIAGNOSIS — C649 Malignant neoplasm of unspecified kidney, except renal pelvis: Secondary | ICD-10-CM | POA: Insufficient documentation

## 2022-01-14 DIAGNOSIS — C642 Malignant neoplasm of left kidney, except renal pelvis: Secondary | ICD-10-CM

## 2022-01-14 DIAGNOSIS — Z5112 Encounter for antineoplastic immunotherapy: Secondary | ICD-10-CM | POA: Insufficient documentation

## 2022-01-14 LAB — CBC WITH DIFFERENTIAL (CANCER CENTER ONLY)
Abs Immature Granulocytes: 0.05 10*3/uL (ref 0.00–0.07)
Basophils Absolute: 0.1 10*3/uL (ref 0.0–0.1)
Basophils Relative: 1 %
Eosinophils Absolute: 0.3 10*3/uL (ref 0.0–0.5)
Eosinophils Relative: 3 %
HCT: 43 % (ref 39.0–52.0)
Hemoglobin: 14.8 g/dL (ref 13.0–17.0)
Immature Granulocytes: 1 %
Lymphocytes Relative: 21 %
Lymphs Abs: 2 10*3/uL (ref 0.7–4.0)
MCH: 32.1 pg (ref 26.0–34.0)
MCHC: 34.4 g/dL (ref 30.0–36.0)
MCV: 93.3 fL (ref 80.0–100.0)
Monocytes Absolute: 0.5 10*3/uL (ref 0.1–1.0)
Monocytes Relative: 5 %
Neutro Abs: 6.5 10*3/uL (ref 1.7–7.7)
Neutrophils Relative %: 69 %
Platelet Count: 294 10*3/uL (ref 150–400)
RBC: 4.61 MIL/uL (ref 4.22–5.81)
RDW: 12.6 % (ref 11.5–15.5)
WBC Count: 9.4 10*3/uL (ref 4.0–10.5)
nRBC: 0 % (ref 0.0–0.2)

## 2022-01-14 LAB — CMP (CANCER CENTER ONLY)
ALT: 32 U/L (ref 0–44)
AST: 19 U/L (ref 15–41)
Albumin: 4.3 g/dL (ref 3.5–5.0)
Alkaline Phosphatase: 65 U/L (ref 38–126)
Anion gap: 7 (ref 5–15)
BUN: 25 mg/dL — ABNORMAL HIGH (ref 6–20)
CO2: 27 mmol/L (ref 22–32)
Calcium: 9.6 mg/dL (ref 8.9–10.3)
Chloride: 104 mmol/L (ref 98–111)
Creatinine: 1.37 mg/dL — ABNORMAL HIGH (ref 0.61–1.24)
GFR, Estimated: >60 mL/min (ref >60)
Glucose, Bld: 140 mg/dL — ABNORMAL HIGH (ref 70–99)
Potassium: 4 mmol/L (ref 3.5–5.1)
Sodium: 138 mmol/L (ref 135–145)
Total Bilirubin: 0.4 mg/dL (ref 0.3–1.2)
Total Protein: 7.4 g/dL (ref 6.5–8.1)

## 2022-01-14 LAB — TSH: TSH: 41.586 u[IU]/mL — ABNORMAL HIGH (ref 0.350–4.500)

## 2022-01-14 MED ORDER — ATENOLOL 50 MG PO TABS: 50.0000 mg | ORAL_TABLET | Freq: Every day | ORAL | 1 refills | Status: DC

## 2022-01-14 MED ORDER — SODIUM CHLORIDE 0.9 % IV SOLN
480.0000 mg | Freq: Once | INTRAVENOUS | Status: AC
  Administered 2022-01-14: 480 mg via INTRAVENOUS
  Filled 2022-01-14: qty 48

## 2022-01-14 MED ORDER — HYDROCHLOROTHIAZIDE 25 MG PO TABS: 25.0000 mg | ORAL_TABLET | Freq: Every morning | ORAL | 1 refills | Status: DC

## 2022-01-14 MED ORDER — SODIUM CHLORIDE 0.9 % IV SOLN: Freq: Once | INTRAVENOUS | Status: AC

## 2022-01-14 MED ORDER — ACETAMINOPHEN 325 MG PO TABS
650.0000 mg | ORAL_TABLET | Freq: Once | ORAL | Status: AC
  Administered 2022-01-14: 650 mg via ORAL
  Filled 2022-01-14: qty 2

## 2022-01-14 NOTE — Progress Notes (Signed)
Hematology and Oncology Follow Up Visit  Bob Ewing [id number] 11-Nov-1970 51 y.o. 01/14/2022 9:57 AM [name], MDShadad, [name], MD   Principle Diagnosis: 26 year old man with kidney cancer diagnosed in April 2022.  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   Ipilimumab 1 mg/kg and nivolumab 3 mg/kg cycle 1 given on October 02, 2020.  Completed cycle 4 of therapy on Dec 03, 2020.  Current therapy: Nivolumab 480 mg every 4 weeks.  There is here for the next cycle of therapy.  Interim History: Mr. Arana returns today for a follow-up visit.  Since last visit, he reports feeling well without any major complaints.  He denies any nausea vomiting or abdominal pain.  He denies any skin rashes or lesions.  He denies any hospitalizations or illnesses.     Medications: Reviewed without changes. Current Outpatient Medications  Medication Sig Dispense Refill   aspirin 81 MG chewable tablet Chew 1 tablet (81 mg total) by mouth daily. 90 tablet 0   atenolol (TENORMIN) 50 MG tablet Take 1 tablet (50 mg total) by mouth daily. 90 tablet 1   hydrochlorothiazide (HYDRODIURIL) 25 MG tablet Take 1 tablet (25 mg total) by mouth every morning. 90 tablet 1   HYDROcodone-acetaminophen (NORCO) 10-325 MG tablet Take 1 tablet by mouth every 4 (four) hours as needed for moderate pain or severe pain.     levothyroxine (SYNTHROID) 50 MCG tablet Take 2 tablets (100 mcg total) by mouth daily before breakfast. 60 tablet 1   metFORMIN (GLUCOPHAGE) 500 MG tablet Take 1 tablet by mouth daily.     No current facility-administered medications for this visit.     Allergies:  No Active Allergies     Physical Exam:      Blood pressure 140/84, pulse 74, temperature 97.6 F (36.4 C), temperature source Temporal, resp.  rate 18, height 5' 7.5" (1.715 m), weight 221 lb 8 oz (100.5 kg), SpO2 97 %.       ECOG: 1   General appearance: Alert, awake without any distress. Head: Atraumatic without abnormalities Oropharynx: Without any thrush or ulcers. Eyes: No scleral icterus. Lymph nodes: No lymphadenopathy noted in the cervical, supraclavicular, or axillary nodes Heart:regular rate and rhythm, without any murmurs or gallops.   Lung: Clear to auscultation without any rhonchi, wheezes or dullness to percussion. Abdomin: Soft, nontender without any shifting dullness or ascites. Musculoskeletal: No clubbing or cyanosis. Neurological: No motor or sensory deficits. Skin: No rashes or lesions.            Lab Results: Lab Results  Component Value Date   WBC 9.4 01/14/2022   HGB 14.8 01/14/2022   HCT 43.0 01/14/2022   MCV 93.3 01/14/2022   PLT 294 01/14/2022     Chemistry      Component Value Date/Time   NA 137 12/17/2021 1134   K 3.9 12/17/2021 1134   CL 104 12/17/2021 1134   CO2 26 12/17/2021 1134   BUN 23 (H) 12/17/2021 1134   CREATININE 1.29 (H) 12/17/2021 1134      Component Value Date/Time   CALCIUM 9.3 12/17/2021 1134   ALKPHOS 59 12/17/2021 1134   AST 19 12/17/2021 1134   ALT 30 12/17/2021 1134   BILITOT 0.3 12/17/2021 1134        Impression and  Plan:  51 year old man with:   Kidney cancer diagnosed in 2022.  He developed stage IV clear-cell  with rhabdoid features with pulmonary involvement.   Treatment choices moving forward were discussed.  Continue to recommend continue immunotherapy for a total of 12 years which will complete in 2024.  Surgical therapy for his residual kidney cancer could be attempted if he continues to have complete response to therapy.  I recommended updating his staging scan in the future.  He is agreeable to proceed.  He would like to continue to defer CT scan for financial reasons.  2.  IV access: Peripheral veins are currently in use without  any need for Port-A-Cath.   3.  Antiemetics: Compazine is available without any nausea or vomiting.   4.  CNS surveillance: no evidence of CNS involvement currently.      5.  Goals of care and prognosis: His disease is incurable although aggressive measures are warranted given his excellent status.   6.  Immune mediated complications: I continue to educate him about potential complications including pneumonitis, hepatitis and thyroid disease.  His TSH continues to drop in response to thyroid replacement.  7.  Hypertension: He is currently on atenolol as well as hydrochlorothiazide.  He is working to establish care with primary care physician I will refill his medication temporarily.  8.  Follow-up: In 4 weeks for repeat evaluation.   30  minutes were dedicated to this visit.  Time spent on reviewing laboratory data, disease status update and outlining future plan of care discussion.     Zola Button, MD 6/16/20239:57 AM

## 2022-01-21 ENCOUNTER — Telehealth: Payer: Self-pay | Admitting: Oncology

## 2022-02-11 ENCOUNTER — Inpatient Hospital Stay (HOSPITAL_BASED_OUTPATIENT_CLINIC_OR_DEPARTMENT_OTHER): Payer: 59 | Admitting: Oncology

## 2022-02-11 ENCOUNTER — Inpatient Hospital Stay: Payer: 59 | Attending: Oncology

## 2022-02-11 ENCOUNTER — Inpatient Hospital Stay: Payer: 59

## 2022-02-11 VITALS — BP 131/85 | HR 79 | Temp 97.7°F | Resp 16 | Ht 67.5 in | Wt 221.8 lb

## 2022-02-11 DIAGNOSIS — Z5112 Encounter for antineoplastic immunotherapy: Secondary | ICD-10-CM | POA: Insufficient documentation

## 2022-02-11 DIAGNOSIS — E039 Hypothyroidism, unspecified: Secondary | ICD-10-CM | POA: Diagnosis not present

## 2022-02-11 DIAGNOSIS — C642 Malignant neoplasm of left kidney, except renal pelvis: Secondary | ICD-10-CM

## 2022-02-11 DIAGNOSIS — C649 Malignant neoplasm of unspecified kidney, except renal pelvis: Secondary | ICD-10-CM | POA: Diagnosis not present

## 2022-02-11 DIAGNOSIS — Z79899 Other long term (current) drug therapy: Secondary | ICD-10-CM | POA: Diagnosis not present

## 2022-02-11 DIAGNOSIS — I10 Essential (primary) hypertension: Secondary | ICD-10-CM | POA: Diagnosis not present

## 2022-02-11 LAB — CBC WITH DIFFERENTIAL (CANCER CENTER ONLY)
Abs Immature Granulocytes: 0.05 10*3/uL (ref 0.00–0.07)
Basophils Absolute: 0.1 10*3/uL (ref 0.0–0.1)
Basophils Relative: 1 %
Eosinophils Absolute: 0.3 10*3/uL (ref 0.0–0.5)
Eosinophils Relative: 2 %
HCT: 42.3 % (ref 39.0–52.0)
Hemoglobin: 14.8 g/dL (ref 13.0–17.0)
Immature Granulocytes: 1 %
Lymphocytes Relative: 22 %
Lymphs Abs: 2.3 10*3/uL (ref 0.7–4.0)
MCH: 32.4 pg (ref 26.0–34.0)
MCHC: 35 g/dL (ref 30.0–36.0)
MCV: 92.6 fL (ref 80.0–100.0)
Monocytes Absolute: 0.6 10*3/uL (ref 0.1–1.0)
Monocytes Relative: 5 %
Neutro Abs: 7.3 10*3/uL (ref 1.7–7.7)
Neutrophils Relative %: 69 %
Platelet Count: 338 10*3/uL (ref 150–400)
RBC: 4.57 MIL/uL (ref 4.22–5.81)
RDW: 12.5 % (ref 11.5–15.5)
WBC Count: 10.5 10*3/uL (ref 4.0–10.5)
nRBC: 0 % (ref 0.0–0.2)

## 2022-02-11 LAB — CMP (CANCER CENTER ONLY)
ALT: 28 U/L (ref 0–44)
AST: 18 U/L (ref 15–41)
Albumin: 4.4 g/dL (ref 3.5–5.0)
Alkaline Phosphatase: 66 U/L (ref 38–126)
Anion gap: 8 (ref 5–15)
BUN: 25 mg/dL — ABNORMAL HIGH (ref 6–20)
CO2: 25 mmol/L (ref 22–32)
Calcium: 9.5 mg/dL (ref 8.9–10.3)
Chloride: 103 mmol/L (ref 98–111)
Creatinine: 1.39 mg/dL — ABNORMAL HIGH (ref 0.61–1.24)
GFR, Estimated: >60 mL/min (ref >60)
Glucose, Bld: 128 mg/dL — ABNORMAL HIGH (ref 70–99)
Potassium: 3.8 mmol/L (ref 3.5–5.1)
Sodium: 136 mmol/L (ref 135–145)
Total Bilirubin: 0.3 mg/dL (ref 0.3–1.2)
Total Protein: 7.6 g/dL (ref 6.5–8.1)

## 2022-02-11 LAB — TSH: TSH: 61.628 u[IU]/mL — ABNORMAL HIGH (ref 0.350–4.500)

## 2022-02-11 MED ORDER — SODIUM CHLORIDE 0.9 % IV SOLN: Freq: Once | INTRAVENOUS | Status: AC

## 2022-02-11 MED ORDER — LEVOTHYROXINE SODIUM 100 MCG PO TABS: 100.0000 ug | ORAL_TABLET | Freq: Every day | ORAL | 3 refills | Status: DC

## 2022-02-11 MED ORDER — ATENOLOL 50 MG PO TABS: 50.0000 mg | ORAL_TABLET | Freq: Every day | ORAL | 3 refills | Status: DC

## 2022-02-11 MED ORDER — ACETAMINOPHEN 325 MG PO TABS
650.0000 mg | ORAL_TABLET | Freq: Once | ORAL | Status: AC
  Administered 2022-02-11: 650 mg via ORAL
  Filled 2022-02-11: qty 2

## 2022-02-11 MED ORDER — SODIUM CHLORIDE 0.9 % IV SOLN
480.0000 mg | Freq: Once | INTRAVENOUS | Status: AC
  Administered 2022-02-11: 480 mg via INTRAVENOUS
  Filled 2022-02-11: qty 48

## 2022-02-11 NOTE — Progress Notes (Signed)
Hematology and Oncology Follow Up Visit  Bob Ewing [id number] 03/05/1971 51 y.o. 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   Ipilimumab 1 mg/kg and nivolumab 3 mg/kg cycle 1 given on October 02, 2020.  Completed cycle 4 of therapy on Dec 03, 2020.  Current therapy: Nivolumab 480 mg every 4 weeks.  He presents for repeat follow-up at the next cycle of therapy.  Interim History: Bob Ewing is here for a follow-up evaluation.  Since last visit, he reports no major changes in his health.  He denies any nausea, vomiting or abdominal pain.  He denies any hospitalizations or illnesses.  His performance status and quality of life remains unchanged.     Medications: Updated on review. Current Outpatient Medications  Medication Sig Dispense Refill   aspirin 81 MG chewable tablet Chew 1 tablet (81 mg total) by mouth daily. 90 tablet 0   atenolol (TENORMIN) 50 MG tablet Take 1 tablet (50 mg total) by mouth daily. 90 tablet 1   hydrochlorothiazide (HYDRODIURIL) 25 MG tablet Take 1 tablet (25 mg total) by mouth every morning. 90 tablet 1   HYDROcodone-acetaminophen (NORCO) 10-325 MG tablet Take 1 tablet by mouth every 4 (four) hours as needed for moderate pain or severe pain.     levothyroxine (SYNTHROID) 50 MCG tablet Take 2 tablets (100 mcg total) by mouth daily before breakfast. 60 tablet 1   metFORMIN (GLUCOPHAGE) 500 MG tablet Take 1 tablet by mouth daily.     No current facility-administered medications for this visit.     Allergies:  No Active Allergies     Physical Exam:       Blood pressure 131/85, pulse 79, temperature 97.7 F (36.5  C), temperature source Temporal, resp. rate 16, height 5' 7.5" (1.715 m), weight 221 lb 12.8 oz (100.6 kg), SpO2 100 %.       ECOG: 1    General appearance: Comfortable appearing without any discomfort Head: Normocephalic without any trauma Oropharynx: Mucous membranes are moist and pink without any thrush or ulcers. Eyes: Pupils are equal and round reactive to light. Lymph nodes: No cervical, supraclavicular, inguinal or axillary lymphadenopathy.   Heart:regular rate and rhythm.  S1 and S2 without leg edema. Lung: Clear without any rhonchi or wheezes.  No dullness to percussion. Abdomin: Soft, nontender, nondistended with good bowel sounds.  No hepatosplenomegaly. Musculoskeletal: No joint deformity or effusion.  Full range of motion noted. Neurological: No deficits noted on motor, sensory and deep tendon reflex exam. Skin: No petechial rash or dryness.  Appeared moist.             Lab Results: Lab Results  Component Value Date   WBC 10.5 02/11/2022   HGB 14.8 02/11/2022   HCT 42.3 02/11/2022   MCV 92.6 02/11/2022   PLT 338 02/11/2022     Chemistry      Component Value Date/Time   NA 138 01/14/2022 0947   K 4.0 01/14/2022 0947   CL 104 01/14/2022 0947   CO2 27 01/14/2022 0947   BUN 25 (H) 01/14/2022 0947   CREATININE 1.37 (H) 01/14/2022 0947      Component Value Date/Time   CALCIUM 9.6  01/14/2022 0947   ALKPHOS 65 01/14/2022 0947   AST 19 01/14/2022 0947   ALT 32 01/14/2022 0947   BILITOT 0.4 01/14/2022 0947        Impression and Plan:  51 year old man with:   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   He is currently on nivolumab maintenance which she has tolerated very well.  Risks and benefits of continuing this treatment long-term were discussed.  I recommended a total of 2 years of immunotherapy given his excellent response.  Different salvage therapy options including oral  targeted therapy will be used upon disease progression.  Complication associated with this therapy including autoimmune concerns and GI toxicity were reiterated.  Plan is to update his staging scans in September and consideration for surgical resection of his primary tumor will be considered if he continues to have no evidence of disease.    2.  IV access: Port-A-Cath option has been deferred at this time.  Peripheral veins are currently in use.   3.  Antiemetics: No nausea or vomiting reported at this time.  Compazine is available to him.   4.  CNS surveillance: He had initial staging without any evidence of metastatic disease on presentation in 2022.      5.  Goals of care and prognosis: Aggressive measures are warranted given his excellent performance status.   6.  Immune mediated complications: He has not experienced any complication at this time.  Pneumonitis, colitis and hypophysitis were reiterated.  7.  Hypertension: His blood pressure under control with Tenormin and hydrochlorothiazide.  I recommended continuing Tenormin for the time being will be refilled for him.  I urged him to establish care with primary care physician for the future.  8.  Hypothyroidism: He is currently on Synthroid replacement and I will recheck his TSH and adjust the dose accordingly.  This will be refilled for him today.  9.  Follow-up: He will return next month for the next cycle of therapy.   30  minutes were spent on this encounter.  The time was dedicated to reviewing laboratory data, disease status update and outlining future plan of care review.     Zola Button, MD 7/14/202311:33 AM

## 2022-02-11 NOTE — Patient Instructions (Signed)
[facility]COLOG[facility]  Discharge Instructions: Thank you for choosing [facility] to provide your oncology and hematology care.   If you have a lab appointment with the Cancer Center, please go directly to the Cancer Center and check in at the registration area.   Wear comfortable clothing and clothing appropriate for easy access to any Portacath or PICC line.   We strive to give you quality time with your provider. You may need to reschedule your appointment if you arrive late (15 or more minutes).  Arriving late affects you and other patients whose appointments are after yours.  Also, if you miss three or more appointments without notifying the office, you may be dismissed from the clinic at the provider's discretion.      For prescription refill requests, have your pharmacy contact our office and allow 72 hours for refills to be completed.    Today you received the following chemotherapy and/or immunotherapy agents: Opdivo      To help prevent nausea and vomiting after your treatment, we encourage you to take your nausea medication as directed.  BELOW ARE SYMPTOMS THAT SHOULD BE REPORTED IMMEDIATELY: *FEVER GREATER THAN 100.4 F (38 C) OR HIGHER *CHILLS OR SWEATING *NAUSEA AND VOMITING THAT IS NOT CONTROLLED WITH YOUR NAUSEA MEDICATION *UNUSUAL SHORTNESS OF BREATH *UNUSUAL BRUISING OR BLEEDING *URINARY PROBLEMS (pain or burning when urinating, or frequent urination) *BOWEL PROBLEMS (unusual diarrhea, constipation, pain near the anus) TENDERNESS IN MOUTH AND THROAT WITH OR WITHOUT PRESENCE OF ULCERS (sore throat, sores in mouth, or a toothache) UNUSUAL RASH, SWELLING OR PAIN  UNUSUAL VAGINAL DISCHARGE OR ITCHING   Items with * indicate a potential emergency and should be followed up as soon as possible or go to the Emergency Department if any problems should occur.  Please show the CHEMOTHERAPY ALERT CARD or IMMUNOTHERAPY ALERT CARD at check-in to the  Emergency Department and triage nurse.  Should you have questions after your visit or need to cancel or reschedule your appointment, please contact [facility]  Dept: [phone]  and follow the prompts.  Office hours are 8:00 a.m. to 4:30 p.m. Monday - Friday. Please note that voicemails left after 4:00 p.m. may not be returned until the following business day.  We are closed weekends and major holidays. You have access to a nurse at all times for urgent questions. Please call the main number to the clinic Dept: [phone] and follow the prompts.   For any non-urgent questions, you may also contact your provider using MyChart. We now offer e-Visits for anyone 18 and older to request care online for non-urgent symptoms. For details visit [email or web address].   Also download the MyChart app! Go to the app store, search "MyChart", open the app, select [facility], and log in with your MyChart username and password.  Masks are optional in the cancer centers. If you would like for your care team to wear a mask while they are taking care of you, please let them know. For doctor visits, patients may have with them one support person who is at least 50 years old. At this time, visitors are not allowed in the infusion area. 

## 2022-02-18 ENCOUNTER — Telehealth: Payer: Self-pay | Admitting: Oncology

## 2022-02-18 NOTE — Telephone Encounter (Signed)
Called patient regarding upcoming appointments, left a voicemail. 

## 2022-02-21 ENCOUNTER — Other Ambulatory Visit: Payer: Self-pay

## 2022-03-10 ENCOUNTER — Other Ambulatory Visit: Payer: Self-pay | Admitting: Oncology

## 2022-03-10 DIAGNOSIS — C642 Malignant neoplasm of left kidney, except renal pelvis: Secondary | ICD-10-CM

## 2022-03-11 ENCOUNTER — Inpatient Hospital Stay (HOSPITAL_BASED_OUTPATIENT_CLINIC_OR_DEPARTMENT_OTHER): Payer: 59 | Admitting: Oncology

## 2022-03-11 ENCOUNTER — Other Ambulatory Visit: Payer: Self-pay | Admitting: Oncology

## 2022-03-11 ENCOUNTER — Other Ambulatory Visit: Payer: Self-pay

## 2022-03-11 ENCOUNTER — Inpatient Hospital Stay: Payer: 59

## 2022-03-11 ENCOUNTER — Inpatient Hospital Stay: Payer: 59 | Attending: Oncology

## 2022-03-11 VITALS — BP 142/89 | HR 71 | Temp 97.9°F | Resp 18 | Ht 67.5 in | Wt 227.1 lb

## 2022-03-11 VITALS — BP 134/85 | HR 63 | Temp 98.2°F | Resp 18

## 2022-03-11 DIAGNOSIS — C642 Malignant neoplasm of left kidney, except renal pelvis: Secondary | ICD-10-CM

## 2022-03-11 DIAGNOSIS — Z5112 Encounter for antineoplastic immunotherapy: Secondary | ICD-10-CM | POA: Insufficient documentation

## 2022-03-11 DIAGNOSIS — C649 Malignant neoplasm of unspecified kidney, except renal pelvis: Secondary | ICD-10-CM | POA: Diagnosis not present

## 2022-03-11 DIAGNOSIS — Z79899 Other long term (current) drug therapy: Secondary | ICD-10-CM | POA: Diagnosis not present

## 2022-03-11 LAB — CMP (CANCER CENTER ONLY)
ALT: 30 U/L (ref 0–44)
AST: 15 U/L (ref 15–41)
Albumin: 4.1 g/dL (ref 3.5–5.0)
Alkaline Phosphatase: 67 U/L (ref 38–126)
Anion gap: 4 — ABNORMAL LOW (ref 5–15)
BUN: 19 mg/dL (ref 6–20)
CO2: 27 mmol/L (ref 22–32)
Calcium: 8.7 mg/dL — ABNORMAL LOW (ref 8.9–10.3)
Chloride: 104 mmol/L (ref 98–111)
Creatinine: 1.1 mg/dL (ref 0.61–1.24)
GFR, Estimated: >60 mL/min (ref >60)
Glucose, Bld: 154 mg/dL — ABNORMAL HIGH (ref 70–99)
Potassium: 3.6 mmol/L (ref 3.5–5.1)
Sodium: 135 mmol/L (ref 135–145)
Total Bilirubin: 0.3 mg/dL (ref 0.3–1.2)
Total Protein: 6.9 g/dL (ref 6.5–8.1)

## 2022-03-11 LAB — CBC WITH DIFFERENTIAL (CANCER CENTER ONLY)
Abs Immature Granulocytes: 0.05 10*3/uL (ref 0.00–0.07)
Basophils Absolute: 0 10*3/uL (ref 0.0–0.1)
Basophils Relative: 1 %
Eosinophils Absolute: 0.1 10*3/uL (ref 0.0–0.5)
Eosinophils Relative: 2 %
HCT: 38.4 % — ABNORMAL LOW (ref 39.0–52.0)
Hemoglobin: 13.3 g/dL (ref 13.0–17.0)
Immature Granulocytes: 1 %
Lymphocytes Relative: 17 %
Lymphs Abs: 1.5 10*3/uL (ref 0.7–4.0)
MCH: 32.1 pg (ref 26.0–34.0)
MCHC: 34.6 g/dL (ref 30.0–36.0)
MCV: 92.8 fL (ref 80.0–100.0)
Monocytes Absolute: 0.5 10*3/uL (ref 0.1–1.0)
Monocytes Relative: 6 %
Neutro Abs: 6.4 10*3/uL (ref 1.7–7.7)
Neutrophils Relative %: 73 %
Platelet Count: 276 10*3/uL (ref 150–400)
RBC: 4.14 MIL/uL — ABNORMAL LOW (ref 4.22–5.81)
RDW: 13 % (ref 11.5–15.5)
WBC Count: 8.6 10*3/uL (ref 4.0–10.5)
nRBC: 0 % (ref 0.0–0.2)

## 2022-03-11 LAB — TSH: TSH: 50.274 u[IU]/mL — ABNORMAL HIGH (ref 0.350–4.500)

## 2022-03-11 MED ORDER — SODIUM CHLORIDE 0.9 % IV SOLN
480.0000 mg | Freq: Once | INTRAVENOUS | Status: AC
  Administered 2022-03-11: 480 mg via INTRAVENOUS
  Filled 2022-03-11: qty 48

## 2022-03-11 MED ORDER — SODIUM CHLORIDE 0.9 % IV SOLN: Freq: Once | INTRAVENOUS | Status: AC

## 2022-03-11 NOTE — Patient Instructions (Signed)
[facility]  Discharge Instructions: Thank you for choosing [facility] to provide your oncology and hematology care.   If you have a lab appointment with the [facility], please go directly to the [facility] and check in at the registration area.   Wear comfortable clothing and clothing appropriate for easy access to any Portacath or PICC line.   We strive to give you quality time with your provider. You may need to reschedule your appointment if you arrive late (15 or more minutes).  Arriving late affects you and other patients whose appointments are after yours.  Also, if you miss three or more appointments without notifying the office, you may be dismissed from the clinic at the provider's discretion.      For prescription refill requests, have your pharmacy contact our office and allow 72 hours for refills to be completed.    Today you received the following chemotherapy and/or immunotherapy agents: Opdivo      To help prevent nausea and vomiting after your treatment, we encourage you to take your nausea medication as directed.  BELOW ARE SYMPTOMS THAT SHOULD BE REPORTED IMMEDIATELY: *FEVER GREATER THAN 100.4 F (38 C) OR HIGHER *CHILLS OR SWEATING *NAUSEA AND VOMITING THAT IS NOT CONTROLLED WITH YOUR NAUSEA MEDICATION *UNUSUAL SHORTNESS OF BREATH *UNUSUAL BRUISING OR BLEEDING *URINARY PROBLEMS (pain or burning when urinating, or frequent urination) *BOWEL PROBLEMS (unusual diarrhea, constipation, pain near the anus) TENDERNESS IN MOUTH AND THROAT WITH OR WITHOUT PRESENCE OF ULCERS (sore throat, sores in mouth, or a toothache) UNUSUAL RASH, SWELLING OR PAIN  UNUSUAL VAGINAL DISCHARGE OR ITCHING   Items with * indicate a potential emergency and should be followed up as soon as possible or go to the Emergency Department if any problems should occur.  Please show the CHEMOTHERAPY ALERT CARD or IMMUNOTHERAPY ALERT CARD at check-in to the  Emergency Department and triage nurse.  Should you have questions after your visit or need to cancel or reschedule your appointment, please contact [facility]  Dept: [phone]  and follow the prompts.  Office hours are 8:00 a.m. to 4:30 p.m. Monday - Friday. Please note that voicemails left after 4:00 p.m. may not be returned until the following business day.  We are closed weekends and major holidays. You have access to a nurse at all times for urgent questions. Please call the main number to the clinic Dept: [phone] and follow the prompts.   For any non-urgent questions, you may also contact your provider using MyChart. We now offer e-Visits for anyone 4 and older to request care online for non-urgent symptoms. For details visit [email or web address].   Also download the MyChart app! Go to the app store, search "MyChart", open the app, select [facility], and log in with your MyChart username and password.  Masks are optional in the cancer centers. If you would like for your care team to wear a mask while they are taking care of you, please let them know. For doctor visits, patients may have with them one support person who is at least 50 years old. At this time, visitors are not allowed in the infusion area. Nivolumab Injection What is this medication? NIVOLUMAB (nye VOL ue mab) treats some types of cancer. It works by helping your immune system slow or stop the spread of cancer cells. It is a monoclonal antibody. This medicine may be used for other purposes; ask your health care provider or pharmacist  if you have questions. COMMON BRAND NAME(S): Opdivo What should I tell my care team before I take this medication? They need to know if you have any of these conditions: Allogeneic stem cell transplant (uses someone else's stem cells) Autoimmune diseases, such as Crohn disease, ulcerative colitis, lupus History of chest radiation Nervous system problems,  such as Guillain-Barre syndrome or myasthenia gravis Organ transplant An unusual or allergic reaction to nivolumab, other medications, foods, dyes, or preservatives Pregnant or trying to get pregnant Breast-feeding How should I use this medication? This medication is infused into a vein. It is given in a hospital or clinic setting. A special MedGuide will be given to you before each treatment. Be sure to read this information carefully each time. Talk to your care team about the use of this medication in children. While it may be prescribed for children as young as 12 years for selected conditions, precautions do apply. Overdosage: If you think you have taken too much of this medicine contact a poison control center or emergency room at once. NOTE: This medicine is only for you. Do not share this medicine with others. What if I miss a dose? Keep appointments for follow-up doses. It is important not to miss your dose. Call your care team if you are unable to keep an appointment. What may interact with this medication? Interactions have not been studied. This list may not describe all possible interactions. Give your health care provider a list of all the medicines, herbs, non-prescription drugs, or dietary supplements you use. Also tell them if you smoke, drink alcohol, or use illegal drugs. Some items may interact with your medicine. What should I watch for while using this medication? Your condition will be monitored carefully while you are receiving this medication. You may need blood work while taking this medication. This medication may cause serious skin reactions. They can happen weeks to months after starting the medication. Contact your care team right away if you notice fevers or flu-like symptoms with a rash. The rash may be red or purple and then turn into blisters or peeling of the skin. You may also notice a red rash with swelling of the face, lips, or lymph nodes in your neck or under  your arms. Tell your care team right away if you have any change in your eyesight. Talk to your care team if you are pregnant or think you might be pregnant. A negative pregnancy test is required before starting this medication. A reliable form of contraception is recommended while taking this medication and for 5 months after the last dose. Talk to your care team about effective forms of contraception. Do not breast-feed while taking this medication and for 5 months after the last dose. What side effects may I notice from receiving this medication? Side effects that you should report to your care team as soon as possible: Allergic reactions--skin rash, itching, hives, swelling of the face, lips, tongue, or throat Dry cough, shortness of breath or trouble breathing Eye pain, redness, irritation, or discharge with blurry or decreased vision Heart muscle inflammation--unusual weakness or fatigue, shortness of breath, chest pain, fast or irregular heartbeat, dizziness, swelling of the ankles, feet, or hands Hormone gland problems--headache, sensitivity to light, unusual weakness or fatigue, dizziness, fast or irregular heartbeat, increased sensitivity to cold or heat, excessive sweating, constipation, hair loss, increased thirst or amount of urine, tremors or shaking, irritability Infusion reactions--chest pain, shortness of breath or trouble breathing, feeling faint or lightheaded Kidney injury (  glomerulonephritis)--decrease in the amount of urine, red or dark brown urine, foamy or bubbly urine, swelling of the ankles, hands, or feet Liver injury--right upper belly pain, loss of appetite, nausea, light-colored stool, dark yellow or brown urine, yellowing skin or eyes, unusual weakness or fatigue Pain, tingling, or numbness in the hands or feet, muscle weakness, change in vision, confusion or trouble speaking, loss of balance or coordination, trouble walking, seizures Rash, fever, and swollen lymph  nodes Redness, blistering, peeling, or loosening of the skin, including inside the mouth Sudden or severe stomach pain, bloody diarrhea, fever, nausea, vomiting Side effects that usually do not require medical attention (report these to your care team if they continue or are bothersome): Bone, joint, or muscle pain Diarrhea Fatigue Loss of appetite Nausea Skin rash This list may not describe all possible side effects. Call your doctor for medical advice about side effects. You may report side effects to FDA at 1-800-FDA-1088. Where should I keep my medication? This medication is given in a hospital or clinic. It will not be stored at home. NOTE: This sheet is a summary. It may not cover all possible information. If you have questions about this medicine, talk to your doctor, pharmacist, or health care provider.  2023 Elsevier/Gold Standard (2021-06-18 00:00:00)

## 2022-03-11 NOTE — Progress Notes (Signed)
Hematology and Oncology Follow Up Visit  Bob Ewing [id number] 1970/12/26 51 y.o. 03/11/2022 10:43 AM [name], MDShadad, [name], MD   Principle Diagnosis: 37 year old man with kidney cancer diagnosed in February 2022.  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   Ipilimumab 1 mg/kg and nivolumab 3 mg/kg cycle 1 given on October 02, 2020.  Completed cycle 4 of therapy on Dec 03, 2020.  Current therapy: Nivolumab 480 mg every 4 weeks.  He is here for the next cycle.  Interim History: Mr. Salek returns today for a follow-up visit.  Since last visit, he reports no major changes in his health.  He denies any recent hospitalizations or illnesses.  He denies any nausea, vomiting or abdominal pain.  He denies any skin rashes or lesions.  He denies shortness of breath or diarrhea.    Medications: Reviewed without changes. Current Outpatient Medications  Medication Sig Dispense Refill   aspirin 81 MG chewable tablet Chew 1 tablet (81 mg total) by mouth daily. 90 tablet 0   atenolol (TENORMIN) 50 MG tablet Take 1 tablet (50 mg total) by mouth daily. 90 tablet 3   hydrochlorothiazide (HYDRODIURIL) 25 MG tablet Take 1 tablet (25 mg total) by mouth every morning. 90 tablet 1   HYDROcodone-acetaminophen (NORCO) 10-325 MG tablet Take 1 tablet by mouth every 4 (four) hours as needed for moderate pain or severe pain.     levothyroxine (SYNTHROID) 100 MCG tablet Take 1 tablet (100 mcg total) by mouth daily before breakfast. 90 tablet 3   metFORMIN (GLUCOPHAGE) 500 MG tablet Take 1 tablet by mouth daily.     No current facility-administered medications for this visit.     Allergies:  No Active Allergies     Physical Exam:       Blood pressure  (!) 142/89, pulse 71, temperature 97.9 F (36.6 C), temperature source Temporal, resp. rate 18, height 5' 7.5" (1.715 m), weight 227 lb 1.6 oz (103 kg), SpO2 96 %.       ECOG: 1   General appearance: Alert, awake without any distress. Head: Atraumatic without abnormalities Oropharynx: Without any thrush or ulcers. Eyes: No scleral icterus. Lymph nodes: No lymphadenopathy noted in the cervical, supraclavicular, or axillary nodes Heart:regular rate and rhythm, without any murmurs or gallops.   Lung: Clear to auscultation without any rhonchi, wheezes or dullness to percussion. Abdomin: Soft, nontender without any shifting dullness or ascites. Musculoskeletal: No clubbing or cyanosis. Neurological: No motor or sensory deficits. Skin: No rashes or lesions.             Lab Results: Lab Results  Component Value Date   WBC 8.6 03/11/2022   HGB 13.3 03/11/2022   HCT 38.4 (L) 03/11/2022   MCV 92.8 03/11/2022   PLT 276 03/11/2022     Chemistry      Component Value Date/Time   NA 136 02/11/2022 1127   K 3.8 02/11/2022 1127   CL 103 02/11/2022 1127   CO2 25 02/11/2022 1127   BUN 25 (H) 02/11/2022 1127   CREATININE 1.39 (H) 02/11/2022 1127      Component Value Date/Time   CALCIUM 9.5 02/11/2022 1127   ALKPHOS 66 02/11/2022 1127   AST 18 02/11/2022 1127   ALT 28  02/11/2022 1127   BILITOT 0.3 02/11/2022 1127        Impression and Plan:  51 year old man with:   Kidney cancer diagnosed in 2022.  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   Risks and benefits of continuing nivolumab treatment were reviewed at this time.  Complications that include autoimmune complications and GI issues were reviewed.  He is agreeable to proceed and the plan is to update his staging scan in the near future.  Alternative treatment options including oral targeted therapy if he has relapsed disease radical nephrectomy  if he continues to have disease progression.    2.  IV access: Peripheral veins are currently in use.   3.  Antiemetics: Compazine is available to him without any nausea or vomiting.   4.  CNS surveillance: No evidence of metastatic disease noted on previous imaging studies.      5.  Goals of care and prognosis: Therapy is palliative although aggressive measures are warranted given his excellent response.   6.  Immune mediated complications: No new complications noted.  These include hypophysitis, pneumonitis and others.  7.  Hypertension: No major changes blood pressure continues to be under control.  8.  Hypothyroidism: He remains on Synthroid we will continue to monitor and adjust his dosing.  9.  Follow-up: In 4 weeks for the next cycle.   30  minutes were spent on this encounter.  The time was dedicated to reviewing laboratory data, disease status update and outlining future plan of care discussion.   Zola Button, MD 8/11/202310:43 AM

## 2022-03-12 ENCOUNTER — Encounter: Payer: Self-pay | Admitting: Oncology

## 2022-03-12 ENCOUNTER — Other Ambulatory Visit: Payer: Self-pay | Admitting: Oncology

## 2022-03-12 LAB — T4: T4, Total: 4.8 ug/dL (ref 4.5–12.0)

## 2022-03-16 ENCOUNTER — Other Ambulatory Visit: Payer: Self-pay

## 2022-03-17 ENCOUNTER — Telehealth: Payer: Self-pay | Admitting: *Deleted

## 2022-03-17 NOTE — Telephone Encounter (Signed)
Completed for signed and faxed

## 2022-03-26 ENCOUNTER — Other Ambulatory Visit: Payer: Self-pay

## 2022-04-01 ENCOUNTER — Other Ambulatory Visit: Payer: Self-pay

## 2022-04-05 ENCOUNTER — Encounter: Payer: Self-pay | Admitting: Oncology

## 2022-04-06 ENCOUNTER — Encounter: Payer: Self-pay | Admitting: Oncology

## 2022-04-08 ENCOUNTER — Inpatient Hospital Stay: Payer: Commercial Managed Care - HMO

## 2022-04-08 ENCOUNTER — Inpatient Hospital Stay: Payer: Commercial Managed Care - HMO | Admitting: Oncology

## 2022-04-11 ENCOUNTER — Encounter: Payer: Self-pay | Admitting: Oncology

## 2022-05-05 ENCOUNTER — Encounter: Payer: Self-pay | Admitting: Oncology

## 2022-05-06 ENCOUNTER — Inpatient Hospital Stay: Payer: Commercial Managed Care - HMO | Attending: Oncology

## 2022-05-06 ENCOUNTER — Inpatient Hospital Stay: Payer: Commercial Managed Care - HMO

## 2022-05-06 ENCOUNTER — Inpatient Hospital Stay (HOSPITAL_BASED_OUTPATIENT_CLINIC_OR_DEPARTMENT_OTHER): Payer: Commercial Managed Care - HMO | Admitting: Oncology

## 2022-05-06 VITALS — BP 143/80 | HR 70 | Temp 98.0°F | Wt 213.9 lb

## 2022-05-06 DIAGNOSIS — E039 Hypothyroidism, unspecified: Secondary | ICD-10-CM | POA: Insufficient documentation

## 2022-05-06 DIAGNOSIS — C642 Malignant neoplasm of left kidney, except renal pelvis: Secondary | ICD-10-CM

## 2022-05-06 DIAGNOSIS — Z79899 Other long term (current) drug therapy: Secondary | ICD-10-CM | POA: Insufficient documentation

## 2022-05-06 DIAGNOSIS — Z5112 Encounter for antineoplastic immunotherapy: Secondary | ICD-10-CM | POA: Diagnosis present

## 2022-05-06 DIAGNOSIS — C649 Malignant neoplasm of unspecified kidney, except renal pelvis: Secondary | ICD-10-CM | POA: Diagnosis not present

## 2022-05-06 DIAGNOSIS — I10 Essential (primary) hypertension: Secondary | ICD-10-CM | POA: Diagnosis not present

## 2022-05-06 LAB — CMP (CANCER CENTER ONLY)
ALT: 28 U/L (ref 0–44)
AST: 16 U/L (ref 15–41)
Albumin: 4.4 g/dL (ref 3.5–5.0)
Alkaline Phosphatase: 69 U/L (ref 38–126)
Anion gap: 6 (ref 5–15)
BUN: 24 mg/dL — ABNORMAL HIGH (ref 6–20)
CO2: 29 mmol/L (ref 22–32)
Calcium: 9.7 mg/dL (ref 8.9–10.3)
Chloride: 102 mmol/L (ref 98–111)
Creatinine: 1.24 mg/dL (ref 0.61–1.24)
GFR, Estimated: >60 mL/min (ref >60)
Glucose, Bld: 130 mg/dL — ABNORMAL HIGH (ref 70–99)
Potassium: 3.4 mmol/L — ABNORMAL LOW (ref 3.5–5.1)
Sodium: 137 mmol/L (ref 135–145)
Total Bilirubin: 0.3 mg/dL (ref 0.3–1.2)
Total Protein: 7.5 g/dL (ref 6.5–8.1)

## 2022-05-06 LAB — CBC WITH DIFFERENTIAL (CANCER CENTER ONLY)
Abs Immature Granulocytes: 0.04 10*3/uL (ref 0.00–0.07)
Basophils Absolute: 0.1 10*3/uL (ref 0.0–0.1)
Basophils Relative: 1 %
Eosinophils Absolute: 0.2 10*3/uL (ref 0.0–0.5)
Eosinophils Relative: 2 %
HCT: 42.3 % (ref 39.0–52.0)
Hemoglobin: 14.6 g/dL (ref 13.0–17.0)
Immature Granulocytes: 1 %
Lymphocytes Relative: 21 %
Lymphs Abs: 1.8 10*3/uL (ref 0.7–4.0)
MCH: 31.9 pg (ref 26.0–34.0)
MCHC: 34.5 g/dL (ref 30.0–36.0)
MCV: 92.6 fL (ref 80.0–100.0)
Monocytes Absolute: 0.5 10*3/uL (ref 0.1–1.0)
Monocytes Relative: 6 %
Neutro Abs: 6.1 10*3/uL (ref 1.7–7.7)
Neutrophils Relative %: 69 %
Platelet Count: 285 10*3/uL (ref 150–400)
RBC: 4.57 MIL/uL (ref 4.22–5.81)
RDW: 12.8 % (ref 11.5–15.5)
WBC Count: 8.6 10*3/uL (ref 4.0–10.5)
nRBC: 0 % (ref 0.0–0.2)

## 2022-05-06 LAB — TSH: TSH: 25.667 u[IU]/mL — ABNORMAL HIGH (ref 0.350–4.500)

## 2022-05-06 MED ORDER — SODIUM CHLORIDE 0.9 % IV SOLN: Freq: Once | INTRAVENOUS | Status: AC

## 2022-05-06 MED ORDER — SODIUM CHLORIDE 0.9 % IV SOLN
480.0000 mg | Freq: Once | INTRAVENOUS | Status: AC
  Administered 2022-05-06: 480 mg via INTRAVENOUS
  Filled 2022-05-06: qty 48

## 2022-05-06 NOTE — Progress Notes (Signed)
Hematology and Oncology Follow Up Visit  Bob Ewing [id number] February 18, 1971 51 y.o. 05/06/2022 12:29 PM Bob Ewing, MDShadad, Bob Dad, MD   Principle Diagnosis: 69 year old man with stage IV clear-cell renal cell carcinoma diagnosed in February 2022.  He was found to have rhabdoid features and pulmonary involvement.  He achieved a complete response to his advanced metastatic disease.   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   Ipilimumab 1 mg/kg and nivolumab 3 mg/kg cycle 1 given on October 02, 2020.  Completed cycle 4 of therapy on Dec 03, 2020.  Current therapy: Nivolumab 480 mg every 4 weeks.  He returns for the next cycle of therapy.  Interim History: Bob Ewing presents today for a follow-up.  Since last visit, he had to skip a few months of treatment due to insurance coverage issues.  Clinically, he reports feeling well without any major complaints.  He denies any nausea, vomiting or abdominal pain.  He denies any hospitalizations or illnesses.  His performance status quality of life remained excellent.    Medications: Updated on review. Current Outpatient Medications  Medication Sig Dispense Refill   aspirin 81 MG chewable tablet Chew 1 tablet (81 mg total) by mouth daily. 90 tablet 0   atenolol (TENORMIN) 50 MG tablet Take 1 tablet (50 mg total) by mouth daily. 90 tablet 3   hydrochlorothiazide (HYDRODIURIL) 25 MG tablet Take 1 tablet (25 mg total) by mouth every morning. 90 tablet 1   HYDROcodone-acetaminophen (NORCO) 10-325 MG tablet Take 1 tablet by mouth every 4 (four) hours as needed for moderate pain or severe pain.     levothyroxine (SYNTHROID) 100 MCG tablet Take 1 tablet (100 mcg total) by mouth daily before breakfast. 90 tablet 3   metFORMIN (GLUCOPHAGE) 500 MG tablet TAKE 1 TABLET BY MOUTH DAILY 90 tablet 1   No current facility-administered  medications for this visit.     Allergies:  No Active Allergies     Physical Exam:         Blood pressure (!) 143/80, pulse 70, temperature 98 F (36.7 C), temperature source Tympanic, weight 213 lb 14.4 oz (97 kg), SpO2 99 %.      ECOG: 1     General appearance: Comfortable appearing without any discomfort Head: Normocephalic without any trauma Oropharynx: Mucous membranes are moist and pink without any thrush or ulcers. Eyes: Pupils are equal and round reactive to light. Lymph nodes: No cervical, supraclavicular, inguinal or axillary lymphadenopathy.   Heart:regular rate and rhythm.  S1 and S2 without leg edema. Lung: Clear without any rhonchi or wheezes.  No dullness to percussion. Abdomin: Soft, nontender, nondistended with good bowel sounds.  No hepatosplenomegaly. Musculoskeletal: No joint deformity or effusion.  Full range of motion noted. Neurological: No deficits noted on motor, sensory and deep tendon reflex exam. Skin: No petechial rash or dryness.  Appeared moist.                Lab Results: Lab Results  Component Value Date   WBC 8.6 03/11/2022   HGB 13.3 03/11/2022   HCT 38.4 (L) 03/11/2022   MCV 92.8 03/11/2022   PLT 276 03/11/2022     Chemistry      Component Value Date/Time   NA 135 03/11/2022 1019   K 3.6 03/11/2022 1019   CL 104 03/11/2022 1019   CO2 27 03/11/2022 1019  BUN 19 03/11/2022 1019   CREATININE 1.10 03/11/2022 1019      Component Value Date/Time   CALCIUM 8.7 (L) 03/11/2022 1019   ALKPHOS 67 03/11/2022 1019   AST 15 03/11/2022 1019   ALT 30 03/11/2022 1019   BILITOT 0.3 03/11/2022 1019        Impression and Plan:  51 year old man with:   Stage IV clear-cell renal cell carcinoma with rhabdoid features with pulmonary involvement diagnosed in February 2022.   He is currently on nivolumab maintenance which she has tolerated without any complaints.  He had an excellent response to therapy of  recommended continue with immunotherapy for at least 12 to 2 years.  Salvage nephrectomy could be also considered if he has stable disease.  I would recommend updating his staging scans in the near future.  He is agreeable with this plan.    2.  IV access: No complications related to peripheral veins noted at this time.      3.  CNS surveillance: Initial imaging studies of the brain did not show any evidence of metastasis.      4.  Goals of care and prognosis: His disease is likely incurable although aggressive measures are warranted given his excellent performance status and response to therapy.   5.  Immune mediated complications: I continue to educate him about these complications occluding pneumonitis, colitis and thyroid disease.  6.  Hypertension: Blood pressure is within normal range.  He continues to be on antihypertensive medication with manageable parameters.  7.  Hypothyroidism: Related to immunotherapy.  He is currently on Synthroid and will adjust the dose according to his thyroid panel.  8.  Follow-up: In 1 month for the next cycle of therapy.  30  minutes were dedicated to this visit.  The time was spent on updating his disease status, treatment choices and outlining future plan of care review.   Bob Button, MD 10/6/202312:29 PM

## 2022-05-06 NOTE — Patient Instructions (Signed)
[facility]   Discharge Instructions: Thank you for choosing [facility] to provide your oncology and hematology care.   If you have a lab appointment with the Cancer Center, please go directly to the Cancer Center and check in at the registration area.   Wear comfortable clothing and clothing appropriate for easy access to any Portacath or PICC line.   We strive to give you quality time with your provider. You may need to reschedule your appointment if you arrive late (15 or more minutes).  Arriving late affects you and other patients whose appointments are after yours.  Also, if you miss three or more appointments without notifying the office, you may be dismissed from the clinic at the provider's discretion.      For prescription refill requests, have your pharmacy contact our office and allow 72 hours for refills to be completed.    Today you received the following chemotherapy and/or immunotherapy agents: nivolumab      To help prevent nausea and vomiting after your treatment, we encourage you to take your nausea medication as directed.  BELOW ARE SYMPTOMS THAT SHOULD BE REPORTED IMMEDIATELY: *FEVER GREATER THAN 100.4 F (38 C) OR HIGHER *CHILLS OR SWEATING *NAUSEA AND VOMITING THAT IS NOT CONTROLLED WITH YOUR NAUSEA MEDICATION *UNUSUAL SHORTNESS OF BREATH *UNUSUAL BRUISING OR BLEEDING *URINARY PROBLEMS (pain or burning when urinating, or frequent urination) *BOWEL PROBLEMS (unusual diarrhea, constipation, pain near the anus) TENDERNESS IN MOUTH AND THROAT WITH OR WITHOUT PRESENCE OF ULCERS (sore throat, sores in mouth, or a toothache) UNUSUAL RASH, SWELLING OR PAIN  UNUSUAL VAGINAL DISCHARGE OR ITCHING   Items with * indicate a potential emergency and should be followed up as soon as possible or go to the Emergency Department if any problems should occur.  Please show the CHEMOTHERAPY ALERT CARD or IMMUNOTHERAPY ALERT CARD at check-in  to the Emergency Department and triage nurse.  Should you have questions after your visit or need to cancel or reschedule your appointment, please contact [facility]  Dept: [phone]  and follow the prompts.  Office hours are 8:00 a.m. to 4:30 p.m. Monday - Friday. Please note that voicemails left after 4:00 p.m. may not be returned until the following business day.  We are closed weekends and major holidays. You have access to a nurse at all times for urgent questions. Please call the main number to the clinic Dept: [phone] and follow the prompts.   For any non-urgent questions, you may also contact your provider using MyChart. We now offer e-Visits for anyone 18 and older to request care online for non-urgent symptoms. For details visit [email or web address].   Also download the MyChart app! Go to the app store, search "MyChart", open the app, select [place], and log in with your MyChart username and password.  Masks are optional in the cancer centers. If you would like for your care team to wear a mask while they are taking care of you, please let them know. You may have one support person who is at least 50 years old accompany you for your appointments. 

## 2022-05-07 LAB — T4: T4, Total: 5.6 ug/dL (ref 4.5–12.0)

## 2022-05-08 ENCOUNTER — Other Ambulatory Visit: Payer: Self-pay

## 2022-05-10 ENCOUNTER — Other Ambulatory Visit: Payer: Self-pay

## 2022-05-16 ENCOUNTER — Other Ambulatory Visit: Payer: Self-pay | Admitting: Oncology

## 2022-05-17 ENCOUNTER — Encounter: Payer: Self-pay | Admitting: Oncology

## 2022-05-23 ENCOUNTER — Telehealth: Payer: Self-pay | Admitting: *Deleted

## 2022-05-23 NOTE — Telephone Encounter (Signed)
PC to patient, no answer, left VM - informed patient his CT has been approved by his insurance & needs to be done at [facility].  Patient may scheduled this at any time, [phone].  Patient advised to contact this office with any questions/concerns, number given.

## 2022-06-20 ENCOUNTER — Other Ambulatory Visit: Payer: Self-pay

## 2022-06-27 ENCOUNTER — Telehealth: Payer: Self-pay | Admitting: Oncology

## 2022-06-27 NOTE — Telephone Encounter (Signed)
Called patient regarding upcoming December appointments, patient is notified. 

## 2022-06-30 ENCOUNTER — Other Ambulatory Visit: Payer: Self-pay | Admitting: *Deleted

## 2022-06-30 ENCOUNTER — Ambulatory Visit
Admission: RE | Admit: 2022-06-30 | Discharge: 2022-06-30 | Disposition: A | Discharge status: Home / Self Care | Payer: Commercial Managed Care - HMO | Source: Ambulatory Visit | Attending: Oncology | Admitting: Oncology

## 2022-06-30 DIAGNOSIS — C642 Malignant neoplasm of left kidney, except renal pelvis: Secondary | ICD-10-CM

## 2022-06-30 MED ORDER — IOPAMIDOL (ISOVUE-300) INJECTION 61%
100.0000 mL | Freq: Once | INTRAVENOUS | Status: AC | PRN
  Administered 2022-06-30: 100 mL via INTRAVENOUS

## 2022-07-01 ENCOUNTER — Inpatient Hospital Stay: Payer: Commercial Managed Care - HMO | Attending: Oncology

## 2022-07-01 ENCOUNTER — Inpatient Hospital Stay: Payer: Commercial Managed Care - HMO

## 2022-07-01 ENCOUNTER — Other Ambulatory Visit: Payer: Self-pay

## 2022-07-01 ENCOUNTER — Inpatient Hospital Stay (HOSPITAL_BASED_OUTPATIENT_CLINIC_OR_DEPARTMENT_OTHER): Payer: Commercial Managed Care - HMO | Admitting: Oncology

## 2022-07-01 VITALS — BP 117/79 | HR 75 | Temp 97.9°F | Resp 19 | Ht 67.5 in | Wt 222.3 lb

## 2022-07-01 DIAGNOSIS — E119 Type 2 diabetes mellitus without complications: Secondary | ICD-10-CM | POA: Diagnosis not present

## 2022-07-01 DIAGNOSIS — E039 Hypothyroidism, unspecified: Secondary | ICD-10-CM | POA: Diagnosis not present

## 2022-07-01 DIAGNOSIS — I10 Essential (primary) hypertension: Secondary | ICD-10-CM | POA: Diagnosis not present

## 2022-07-01 DIAGNOSIS — Z79899 Other long term (current) drug therapy: Secondary | ICD-10-CM | POA: Diagnosis not present

## 2022-07-01 DIAGNOSIS — Z5112 Encounter for antineoplastic immunotherapy: Secondary | ICD-10-CM | POA: Insufficient documentation

## 2022-07-01 DIAGNOSIS — C7801 Secondary malignant neoplasm of right lung: Secondary | ICD-10-CM | POA: Insufficient documentation

## 2022-07-01 DIAGNOSIS — C642 Malignant neoplasm of left kidney, except renal pelvis: Secondary | ICD-10-CM | POA: Diagnosis not present

## 2022-07-01 DIAGNOSIS — F17210 Nicotine dependence, cigarettes, uncomplicated: Secondary | ICD-10-CM | POA: Diagnosis not present

## 2022-07-01 LAB — CBC WITH DIFFERENTIAL (CANCER CENTER ONLY)
Abs Immature Granulocytes: 0.04 10*3/uL (ref 0.00–0.07)
Basophils Absolute: 0 10*3/uL (ref 0.0–0.1)
Basophils Relative: 0 %
Eosinophils Absolute: 0.2 10*3/uL (ref 0.0–0.5)
Eosinophils Relative: 2 %
HCT: 43.4 % (ref 39.0–52.0)
Hemoglobin: 14.8 g/dL (ref 13.0–17.0)
Immature Granulocytes: 1 %
Lymphocytes Relative: 14 %
Lymphs Abs: 1.1 10*3/uL (ref 0.7–4.0)
MCH: 31.9 pg (ref 26.0–34.0)
MCHC: 34.1 g/dL (ref 30.0–36.0)
MCV: 93.5 fL (ref 80.0–100.0)
Monocytes Absolute: 0.3 10*3/uL (ref 0.1–1.0)
Monocytes Relative: 4 %
Neutro Abs: 6.2 10*3/uL (ref 1.7–7.7)
Neutrophils Relative %: 79 %
Platelet Count: 295 10*3/uL (ref 150–400)
RBC: 4.64 MIL/uL (ref 4.22–5.81)
RDW: 13.1 % (ref 11.5–15.5)
WBC Count: 7.9 10*3/uL (ref 4.0–10.5)
nRBC: 0 % (ref 0.0–0.2)

## 2022-07-01 LAB — CMP (CANCER CENTER ONLY)
ALT: 28 U/L (ref 0–44)
AST: 17 U/L (ref 15–41)
Albumin: 4.5 g/dL (ref 3.5–5.0)
Alkaline Phosphatase: 65 U/L (ref 38–126)
Anion gap: 6 (ref 5–15)
BUN: 19 mg/dL (ref 6–20)
CO2: 28 mmol/L (ref 22–32)
Calcium: 9.6 mg/dL (ref 8.9–10.3)
Chloride: 103 mmol/L (ref 98–111)
Creatinine: 1.15 mg/dL (ref 0.61–1.24)
GFR, Estimated: >60 mL/min (ref >60)
Glucose, Bld: 140 mg/dL — ABNORMAL HIGH (ref 70–99)
Potassium: 3.7 mmol/L (ref 3.5–5.1)
Sodium: 137 mmol/L (ref 135–145)
Total Bilirubin: 0.3 mg/dL (ref 0.3–1.2)
Total Protein: 7.5 g/dL (ref 6.5–8.1)

## 2022-07-01 LAB — TSH: TSH: 40.313 u[IU]/mL — ABNORMAL HIGH (ref 0.350–4.500)

## 2022-07-01 MED ORDER — SODIUM CHLORIDE 0.9 % IV SOLN
480.0000 mg | Freq: Once | INTRAVENOUS | Status: AC
  Administered 2022-07-01: 480 mg via INTRAVENOUS
  Filled 2022-07-01: qty 48

## 2022-07-01 MED ORDER — SODIUM CHLORIDE 0.9 % IV SOLN: Freq: Once | INTRAVENOUS | Status: AC

## 2022-07-01 NOTE — Patient Instructions (Signed)
[facility]   Discharge Instructions: Thank you for choosing [facility] to provide your oncology and hematology care.   If you have a lab appointment with the Cancer Center, please go directly to the Cancer Center and check in at the registration area.   Wear comfortable clothing and clothing appropriate for easy access to any Portacath or PICC line.   We strive to give you quality time with your provider. You may need to reschedule your appointment if you arrive late (15 or more minutes).  Arriving late affects you and other patients whose appointments are after yours.  Also, if you miss three or more appointments without notifying the office, you may be dismissed from the clinic at the provider's discretion.      For prescription refill requests, have your pharmacy contact our office and allow 72 hours for refills to be completed.    Today you received the following chemotherapy and/or immunotherapy agents: nivolumab      To help prevent nausea and vomiting after your treatment, we encourage you to take your nausea medication as directed.  BELOW ARE SYMPTOMS THAT SHOULD BE REPORTED IMMEDIATELY: *FEVER GREATER THAN 100.4 F (38 C) OR HIGHER *CHILLS OR SWEATING *NAUSEA AND VOMITING THAT IS NOT CONTROLLED WITH YOUR NAUSEA MEDICATION *UNUSUAL SHORTNESS OF BREATH *UNUSUAL BRUISING OR BLEEDING *URINARY PROBLEMS (pain or burning when urinating, or frequent urination) *BOWEL PROBLEMS (unusual diarrhea, constipation, pain near the anus) TENDERNESS IN MOUTH AND THROAT WITH OR WITHOUT PRESENCE OF ULCERS (sore throat, sores in mouth, or a toothache) UNUSUAL RASH, SWELLING OR PAIN  UNUSUAL VAGINAL DISCHARGE OR ITCHING   Items with * indicate a potential emergency and should be followed up as soon as possible or go to the Emergency Department if any problems should occur.  Please show the CHEMOTHERAPY ALERT CARD or IMMUNOTHERAPY ALERT CARD at check-in  to the Emergency Department and triage nurse.  Should you have questions after your visit or need to cancel or reschedule your appointment, please contact [facility]  Dept: [phone]  and follow the prompts.  Office hours are 8:00 a.m. to 4:30 p.m. Monday - Friday. Please note that voicemails left after 4:00 p.m. may not be returned until the following business day.  We are closed weekends and major holidays. You have access to a nurse at all times for urgent questions. Please call the main number to the clinic Dept: [phone] and follow the prompts.   For any non-urgent questions, you may also contact your provider using MyChart. We now offer e-Visits for anyone 18 and older to request care online for non-urgent symptoms. For details visit [email or web address].   Also download the MyChart app! Go to the app store, search "MyChart", open the app, select [place], and log in with your MyChart username and password.  Masks are optional in the cancer centers. If you would like for your care team to wear a mask while they are taking care of you, please let them know. You may have one support person who is at least 50 years old accompany you for your appointments. 

## 2022-07-01 NOTE — Progress Notes (Signed)
Hematology and Oncology Follow Up Visit  Bob Ewing [id number] [birth year] 51 y.o. 07/01/2022 12:41 PM Bob Ewing, MDShadad, [name], MD   Principle Diagnosis: 64 year old man with kidney cancer diagnosed in February 2022.  He presented with stage IV clear-cell and rhabdoid features.  He had pulmonary involvement as well as isolated bone lesions and lymphadenopathy and had a near complete response to immunotherapy.  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   Ipilimumab 1 mg/kg and nivolumab 3 mg/kg cycle 1 given on October 02, 2020.  Completed cycle 4 of therapy on Dec 03, 2020.  Current therapy: Nivolumab 480 mg every 4 weeks maintenance.  He presents for the next cycle of therapy.  Interim History: Bob Ewing returns today for repeat evaluation.  Since the last visit, he reports feeling well without any major complaints.  He denies any nausea, vomiting or abdominal pain.  He denies any excessive fatigue or tiredness.  He denies any weakness or hospitalizations.  His performance status and quality of life remains unchanged.    Medications: Reviewed without changes. Current Outpatient Medications  Medication Sig Dispense Refill   aspirin 81 MG chewable tablet Chew 1 tablet (81 mg total) by mouth daily. 90 tablet 0   atenolol (TENORMIN) 50 MG tablet Take 1 tablet (50 mg total) by mouth daily. 90 tablet 3   hydrochlorothiazide (HYDRODIURIL) 25 MG tablet TAKE 1 TABLET(25 MG) BY MOUTH EVERY MORNING 90 tablet 1   HYDROcodone-acetaminophen (NORCO) 10-325 MG tablet Take 1 tablet by mouth every 4 (four) hours as needed for moderate pain or severe pain.     levothyroxine (SYNTHROID) 100 MCG tablet Take 1 tablet (100 mcg total) by mouth daily before breakfast. 90 tablet 3   metFORMIN (GLUCOPHAGE) 500 MG tablet TAKE 1 TABLET BY MOUTH DAILY 90 tablet 1   No current  facility-administered medications for this visit.     Allergies:  No Active Allergies     Physical Exam:          Blood pressure 117/79, pulse 75, temperature 97.9 F (36.6 C), temperature source Temporal, resp. rate 19, height 5' 7.5" (1.715 m), weight 222 lb 4.8 oz (100.8 kg), SpO2 95 %.      ECOG: 1    General appearance: Alert, awake without any distress. Head: Atraumatic without abnormalities Oropharynx: Without any thrush or ulcers. Eyes: No scleral icterus. Lymph nodes: No lymphadenopathy noted in the cervical, supraclavicular, or axillary nodes Heart:regular rate and rhythm, without any murmurs or gallops.   Lung: Clear to auscultation without any rhonchi, wheezes or dullness to percussion. Abdomin: Soft, nontender without any shifting dullness or ascites. Musculoskeletal: No clubbing or cyanosis. Neurological: No motor or sensory deficits. Skin: No rashes or lesions.               Lab Results: Lab Results  Component Value Date   WBC 8.6 05/06/2022   HGB 14.6 05/06/2022   HCT 42.3 05/06/2022   MCV 92.6 05/06/2022   PLT 285 05/06/2022     Chemistry      Component Value Date/Time   NA 137 05/06/2022 1258   K 3.4 (L) 05/06/2022 1258   CL 102 05/06/2022 1258   CO2 29 05/06/2022 1258   BUN 24 (H) 05/06/2022 1258   CREATININE 1.24 05/06/2022 1258      Component Value Date/Time   CALCIUM 9.7 05/06/2022 1258  ALKPHOS 69 05/06/2022 1258   AST 16 05/06/2022 1258   ALT 28 05/06/2022 1258   BILITOT 0.3 05/06/2022 1258     Narrative & Impression  CLINICAL DATA:  Renal cell carcinoma with recurrence. On immunotherapy. Hypertension. Diabetes. Current smoker. * Tracking Code: BO *   EXAM: CT CHEST WITH CONTRAST   CT ABDOMEN AND PELVIS WITH AND WITHOUT CONTRAST   TECHNIQUE: Multidetector CT imaging of the chest was performed during intravenous contrast administration. Multidetector CT imaging of the abdomen and pelvis was  performed following the standard protocol before and during bolus administration of intravenous contrast.   RADIATION DOSE REDUCTION: This exam was performed according to the departmental dose-optimization program which includes automated exposure control, adjustment of the mA and/or kV according to patient size and/or use of iterative reconstruction technique.   CONTRAST:  165m ISOVUE-300 IOPAMIDOL (ISOVUE-300) INJECTION 61%   COMPARISON:  05/13/2021   FINDINGS: CT CHEST FINDINGS   Cardiovascular: Bovine arch. Aortic atherosclerosis. Normal heart size, without pericardial effusion. No central pulmonary embolism, on this non-dedicated study.   Mediastinum/Nodes: No supraclavicular adenopathy. No mediastinal or hilar adenopathy. Left infrahilar nodal tissue is upper normal sized and similar at 7 mm on 52/5.   Lungs/Pleura: No pleural fluid. Mild centrilobular and paraseptal emphysema.   The tiny right apical pulmonary nodule is similar to further decreased in size at 2 mm on 31/16 versus 3 mm on the prior.   The left lower lobe 3 mm nodule on the prior exam is no longer identified.   A subpleural left upper lobe 3 mm nodule on 59/16 is unchanged. No new or progressive nodules identified.   Musculoskeletal: Posterolateral right ninth rib expansile heterogeneous lesion is similar including at up to 3.3 cm on 77/5. No pathologic fracture.   CT ABDOMEN AND PELVIS FINDINGS   Hepatobiliary: Nonspecific mild caudate lobe enlargement. No focal liver lesion. Normal gallbladder, without biliary ductal dilatation.   Pancreas: Normal, without mass or ductal dilatation.   Spleen: Normal in size, without focal abnormality.   Adrenals/Urinary Tract: Minimal left adrenal nodularity is unchanged. Normal right adrenal gland. An interpolar right renal 2 mm too small to characterize lesion. No right-sided hydronephrosis.   Minimal upper pole left-sided caliectasis. Central inter/lower  pole left renal mass measures 5.4 x 5.4 cm on 124/5. 5.0 x 5.1 cm when remeasured in a similar fashion on the prior. 5.6 cm craniocaudal on 71/14 today versus 5.2 cm when remeasured on image 60/4 of the prior exam in a similar fashion.   Normal urinary bladder.   Stomach/Bowel: Normal stomach, without wall thickening. Normal colon, appendix, and terminal ileum. Small bowel loops in the left side of the abdomen are upper normal caliber, without cause identified.   Vascular/Lymphatic: Aortic atherosclerosis.  Patent renal veins.   Abdominal retroperitoneal adenopathy. Index left periaortic node measures 0.7 x 1.5 cm on 117/5 versus versus 0.9 X 1.6 cm on the prior.   More caudal left periaortic node measures 8 mm on 137/5 and is similar to on the prior (when remeasured). No pelvic sidewall adenopathy.   Reproductive: Mild prostatomegaly.   Other: No significant free fluid.   Musculoskeletal: Degenerative partial fusion of the right sacroiliac joint. Mild disc bulge at L4-5 and L5-S1.   IMPRESSION: 1. Mild enlargement of the left renal mass with similar secondary upper pole left-sided caliectasis. 2. Similar abdominal retroperitoneal adenopathy. 3. Similar expansile posterior right ninth rib lesion. 4. Similar and decrease in size of tiny pulmonary nodules. No new  or progressive metastatic disease in the chest. 5. Aortic atherosclerosis (ICD10-I70.0) and emphysema (ICD10-J43.9).     Impression and Plan:  51 year old man with:   Kidney cancer diagnosed February 2022.  He was found to have stage IV clear-cell with rhabdoid features and pulmonary involvement.   CT scan of the June 30, 2022 was personally reviewed and discussed with the patient today.  He continues to have excellent response to therapy with very little residual disease.  He has very small pulmonary nodules and borderline adenopathy.  He continues to have the primary tumor in place.  Risks and benefits of  proceeding with salvage nephrectomy were discussed at this time.  Given is a very limited metastatic disease it would be reasonable to consider this at this time.  I have also recommended maintenance immunotherapy for the time being for at least complete 2 years.  He has a disease progression in the future only targeted therapy with cabozantinib, axitinib or others could be considered.   After discussion today, we opted to continue with nivolumab single agent to complete 2 years of therapy which will be completed in March 2024.  Salvage nephrectomy could be considered at that time.  He is thinking about this option if he is agreeable will make the appropriate referral to urology.    2.  IV access: No issues reported with peripheral veins at this time.      3.  CNS surveillance: No evidence of metastatic disease to brain at this time.  This was based on initial imaging in 2022.      4.  Goals of care and prognosis: Therapy remains palliative and the likelihood of cure at this time is limited.  Aggressive measures remain warranted however given his excellent response.   5.  Immune mediated complications: He has not experienced any pneumonitis, colitis and hypophysitis.  6.  Hypertension: His blood pressure is under excellent control.  7.  Hypothyroidism: His TSH continues to decline with normalization of his T4.  8.  Follow-up: In 1 month of therapy.  30  minutes were spent on this encounter.  Time was dedicated to reviewing laboratory data, disease status update and outlining future plan of care discussion.   Zola Button, MD 12/1/202312:41 PM

## 2022-07-02 ENCOUNTER — Other Ambulatory Visit: Payer: Self-pay

## 2022-07-03 LAB — T4: T4, Total: 5.4 ug/dL (ref 4.5–12.0)

## 2022-07-05 ENCOUNTER — Telehealth: Payer: Self-pay | Admitting: *Deleted

## 2022-07-05 NOTE — Telephone Encounter (Signed)
Completed form faxed to [place] and [place]. RTW now is OK

## 2022-07-05 NOTE — Telephone Encounter (Signed)
Received Return to Work certification form for Dr Alen Blew to complete.

## 2022-07-10 ENCOUNTER — Other Ambulatory Visit: Payer: Self-pay

## 2022-07-11 ENCOUNTER — Other Ambulatory Visit: Payer: Self-pay

## 2022-07-18 ENCOUNTER — Telehealth: Payer: Self-pay | Admitting: Oncology

## 2022-07-18 NOTE — Telephone Encounter (Signed)
Called patient regarding December and January appointments, patient is notified. 

## 2022-07-29 ENCOUNTER — Inpatient Hospital Stay (HOSPITAL_BASED_OUTPATIENT_CLINIC_OR_DEPARTMENT_OTHER): Payer: Commercial Managed Care - HMO | Admitting: Oncology

## 2022-07-29 ENCOUNTER — Other Ambulatory Visit: Payer: Self-pay

## 2022-07-29 ENCOUNTER — Inpatient Hospital Stay: Payer: Commercial Managed Care - HMO

## 2022-07-29 VITALS — BP 142/85 | HR 65 | Temp 97.6°F | Resp 20 | Wt 218.0 lb

## 2022-07-29 DIAGNOSIS — C642 Malignant neoplasm of left kidney, except renal pelvis: Secondary | ICD-10-CM

## 2022-07-29 DIAGNOSIS — Z5112 Encounter for antineoplastic immunotherapy: Secondary | ICD-10-CM | POA: Diagnosis not present

## 2022-07-29 LAB — CBC WITH DIFFERENTIAL (CANCER CENTER ONLY)
Abs Immature Granulocytes: 0.03 10*3/uL (ref 0.00–0.07)
Basophils Absolute: 0 10*3/uL (ref 0.0–0.1)
Basophils Relative: 1 %
Eosinophils Absolute: 0.2 10*3/uL (ref 0.0–0.5)
Eosinophils Relative: 2 %
HCT: 41 % (ref 39.0–52.0)
Hemoglobin: 14.1 g/dL (ref 13.0–17.0)
Immature Granulocytes: 0 %
Lymphocytes Relative: 18 %
Lymphs Abs: 1.4 10*3/uL (ref 0.7–4.0)
MCH: 31.5 pg (ref 26.0–34.0)
MCHC: 34.4 g/dL (ref 30.0–36.0)
MCV: 91.7 fL (ref 80.0–100.0)
Monocytes Absolute: 0.5 10*3/uL (ref 0.1–1.0)
Monocytes Relative: 7 %
Neutro Abs: 5.6 10*3/uL (ref 1.7–7.7)
Neutrophils Relative %: 72 %
Platelet Count: 279 10*3/uL (ref 150–400)
RBC: 4.47 MIL/uL (ref 4.22–5.81)
RDW: 13 % (ref 11.5–15.5)
WBC Count: 7.8 10*3/uL (ref 4.0–10.5)
nRBC: 0 % (ref 0.0–0.2)

## 2022-07-29 LAB — CMP (CANCER CENTER ONLY)
ALT: 29 U/L (ref 0–44)
AST: 20 U/L (ref 15–41)
Albumin: 4.3 g/dL (ref 3.5–5.0)
Alkaline Phosphatase: 62 U/L (ref 38–126)
Anion gap: 5 (ref 5–15)
BUN: 18 mg/dL (ref 6–20)
CO2: 28 mmol/L (ref 22–32)
Calcium: 9.6 mg/dL (ref 8.9–10.3)
Chloride: 105 mmol/L (ref 98–111)
Creatinine: 1.04 mg/dL (ref 0.61–1.24)
GFR, Estimated: >60 mL/min (ref >60)
Glucose, Bld: 145 mg/dL — ABNORMAL HIGH (ref 70–99)
Potassium: 3.8 mmol/L (ref 3.5–5.1)
Sodium: 138 mmol/L (ref 135–145)
Total Bilirubin: 0.3 mg/dL (ref 0.3–1.2)
Total Protein: 7.3 g/dL (ref 6.5–8.1)

## 2022-07-29 LAB — TSH: TSH: 18.913 u[IU]/mL — ABNORMAL HIGH (ref 0.350–4.500)

## 2022-07-29 MED ORDER — HEPARIN SOD (PORK) LOCK FLUSH 100 UNIT/ML IV SOLN: 500.0000 [IU] | Freq: Once | INTRAVENOUS | Status: DC | PRN

## 2022-07-29 MED ORDER — SODIUM CHLORIDE 0.9 % IV SOLN: Freq: Once | INTRAVENOUS | Status: AC

## 2022-07-29 MED ORDER — SODIUM CHLORIDE 0.9% FLUSH: 10.0000 mL | INTRAVENOUS | Status: DC | PRN

## 2022-07-29 MED ORDER — SODIUM CHLORIDE 0.9 % IV SOLN
480.0000 mg | Freq: Once | INTRAVENOUS | Status: AC
  Administered 2022-07-29: 480 mg via INTRAVENOUS
  Filled 2022-07-29: qty 48

## 2022-07-29 NOTE — Patient Instructions (Signed)
[facility]  Discharge Instructions: Thank you for choosing [facility] to provide your oncology and hematology care.   If you have a lab appointment with the Cancer Center, please go directly to the Cancer Center and check in at the registration area.   Wear comfortable clothing and clothing appropriate for easy access to any Portacath or PICC line.   We strive to give you quality time with your provider. You may need to reschedule your appointment if you arrive late (15 or more minutes).  Arriving late affects you and other patients whose appointments are after yours.  Also, if you miss three or more appointments without notifying the office, you may be dismissed from the clinic at the provider's discretion.      For prescription refill requests, have your pharmacy contact our office and allow 72 hours for refills to be completed.    Today you received the following chemotherapy and/or immunotherapy agents :  Nivolumab      To help prevent nausea and vomiting after your treatment, we encourage you to take your nausea medication as directed.  BELOW ARE SYMPTOMS THAT SHOULD BE REPORTED IMMEDIATELY: *FEVER GREATER THAN 100.4 F (38 C) OR HIGHER *CHILLS OR SWEATING *NAUSEA AND VOMITING THAT IS NOT CONTROLLED WITH YOUR NAUSEA MEDICATION *UNUSUAL SHORTNESS OF BREATH *UNUSUAL BRUISING OR BLEEDING *URINARY PROBLEMS (pain or burning when urinating, or frequent urination) *BOWEL PROBLEMS (unusual diarrhea, constipation, pain near the anus) TENDERNESS IN MOUTH AND THROAT WITH OR WITHOUT PRESENCE OF ULCERS (sore throat, sores in mouth, or a toothache) UNUSUAL RASH, SWELLING OR PAIN  UNUSUAL VAGINAL DISCHARGE OR ITCHING   Items with * indicate a potential emergency and should be followed up as soon as possible or go to the Emergency Department if any problems should occur.  Please show the CHEMOTHERAPY ALERT CARD or IMMUNOTHERAPY ALERT CARD at check-in  to the Emergency Department and triage nurse.  Should you have questions after your visit or need to cancel or reschedule your appointment, please contact [facility]  Dept: [phone]  and follow the prompts.  Office hours are 8:00 a.m. to 4:30 p.m. Monday - Friday. Please note that voicemails left after 4:00 p.m. may not be returned until the following business day.  We are closed weekends and major holidays. You have access to a nurse at all times for urgent questions. Please call the main number to the clinic Dept: [phone] and follow the prompts.   For any non-urgent questions, you may also contact your provider using MyChart. We now offer e-Visits for anyone 18 and older to request care online for non-urgent symptoms. For details visit [email or web address].   Also download the MyChart app! Go to the app store, search "MyChart", open the app, select , and log in with your MyChart username and password.   

## 2022-07-29 NOTE — Progress Notes (Signed)
Hematology and Oncology Follow Up Visit  Bob Ewing [id number] 10/09/1970 51 y.o. 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  He presented with pulmonary involvement, and lymphadenopathy.  He achieved a near complete response to immunotherapy.  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   Ipilimumab 1 mg/kg and nivolumab 3 mg/kg cycle 1 given on October 02, 2020.  Completed cycle 4 of therapy on Dec 03, 2020.  Current therapy: Nivolumab 480 mg every 4 weeks maintenance.  He returns for the next cycle of therapy.  Interim History: Bob Ewing is here for a follow-up.  Since the last visit, he reports feeling well without any major complaints.  He denies any nausea, vomiting or abdominal discomfort.  He denies any hospitalizations or illnesses.  He denies any skin rashes or lesions.  He denies any diarrhea or shortness of breath.  Performance status quality of life remain excellent.    Medications: Updated on review. Current Outpatient Medications  Medication Sig Dispense Refill   aspirin 81 MG chewable tablet Chew 1 tablet (81 mg total) by mouth daily. 90 tablet 0   atenolol (TENORMIN) 50 MG tablet Take 1 tablet (50 mg total) by mouth daily. 90 tablet 3   hydrochlorothiazide (HYDRODIURIL) 25 MG tablet TAKE 1 TABLET(25 MG) BY MOUTH EVERY MORNING 90 tablet 1   HYDROcodone-acetaminophen (NORCO) 10-325 MG tablet Take 1 tablet by mouth every 4 (four) hours as needed for moderate pain or severe pain.     levothyroxine (SYNTHROID) 100 MCG tablet Take 1 tablet (100 mcg total) by mouth daily before breakfast. 90 tablet 3   metFORMIN (GLUCOPHAGE) 500 MG tablet TAKE 1 TABLET BY MOUTH DAILY 90 tablet 1   No current  facility-administered medications for this visit.     Allergies:  No Active Allergies     Physical Exam:          Blood pressure (!) 142/85, pulse 65, temperature 97.6 F (36.4 C), temperature source Temporal, resp. rate 20, weight 218 lb (98.9 kg), SpO2 100 %.       ECOG: 1   General appearance: Comfortable appearing without any discomfort Head: Normocephalic without any trauma Oropharynx: Mucous membranes are moist and pink without any thrush or ulcers. Eyes: Pupils are equal and round reactive to light. Lymph nodes: No cervical, supraclavicular, inguinal or axillary lymphadenopathy.   Heart:regular rate and rhythm.  S1 and S2 without leg edema. Lung: Clear without any rhonchi or wheezes.  No dullness to percussion. Abdomin: Soft, nontender, nondistended with good bowel sounds.  No hepatosplenomegaly. Musculoskeletal: No joint deformity or effusion.  Full range of motion noted. Neurological: No deficits noted on motor, sensory and deep tendon reflex exam. Skin: No petechial rash or dryness.  Appeared moist.                 Lab Results: Lab Results  Component Value Date   WBC 7.9 07/01/2022   HGB 14.8 07/01/2022   HCT 43.4 07/01/2022   MCV 93.5 07/01/2022   PLT 295 07/01/2022     Chemistry      Component Value Date/Time   NA 137 07/01/2022 1254   K 3.7 07/01/2022 1254   CL 103 07/01/2022 1254   CO2 28 07/01/2022 1254  BUN 19 07/01/2022 1254   CREATININE 1.15 07/01/2022 1254      Component Value Date/Time   CALCIUM 9.6 07/01/2022 1254   ALKPHOS 65 07/01/2022 1254   AST 17 07/01/2022 1254   ALT 28 07/01/2022 1254   BILITOT 0.3 07/01/2022 1254     Narrative & Impression  CLINICAL DATA:  Renal cell carcinoma with recurrence. On immunotherapy. Hypertension. Diabetes. Current smoker. * Tracking Code: BO *   EXAM: CT CHEST WITH CONTRAST   CT ABDOMEN AND PELVIS WITH AND WITHOUT CONTRAST   TECHNIQUE: Multidetector CT imaging of  the chest was performed during intravenous contrast administration. Multidetector CT imaging of the abdomen and pelvis was performed following the standard protocol before and during bolus administration of intravenous contrast.   RADIATION DOSE REDUCTION: This exam was performed according to the departmental dose-optimization program which includes automated exposure control, adjustment of the mA and/or kV according to patient size and/or use of iterative reconstruction technique.   CONTRAST:  116m ISOVUE-300 IOPAMIDOL (ISOVUE-300) INJECTION 61%   COMPARISON:  05/13/2021   FINDINGS: CT CHEST FINDINGS   Cardiovascular: Bovine arch. Aortic atherosclerosis. Normal heart size, without pericardial effusion. No central pulmonary embolism, on this non-dedicated study.   Mediastinum/Nodes: No supraclavicular adenopathy. No mediastinal or hilar adenopathy. Left infrahilar nodal tissue is upper normal sized and similar at 7 mm on 52/5.   Lungs/Pleura: No pleural fluid. Mild centrilobular and paraseptal emphysema.   The tiny right apical pulmonary nodule is similar to further decreased in size at 2 mm on 31/16 versus 3 mm on the prior.   The left lower lobe 3 mm nodule on the prior exam is no longer identified.   A subpleural left upper lobe 3 mm nodule on 59/16 is unchanged. No new or progressive nodules identified.   Musculoskeletal: Posterolateral right ninth rib expansile heterogeneous lesion is similar including at up to 3.3 cm on 77/5. No pathologic fracture.   CT ABDOMEN AND PELVIS FINDINGS   Hepatobiliary: Nonspecific mild caudate lobe enlargement. No focal liver lesion. Normal gallbladder, without biliary ductal dilatation.   Pancreas: Normal, without mass or ductal dilatation.   Spleen: Normal in size, without focal abnormality.   Adrenals/Urinary Tract: Minimal left adrenal nodularity is unchanged. Normal right adrenal gland. An interpolar right renal 2 mm too  small to characterize lesion. No right-sided hydronephrosis.   Minimal upper pole left-sided caliectasis. Central inter/lower pole left renal mass measures 5.4 x 5.4 cm on 124/5. 5.0 x 5.1 cm when remeasured in a similar fashion on the prior. 5.6 cm craniocaudal on 71/14 today versus 5.2 cm when remeasured on image 60/4 of the prior exam in a similar fashion.   Normal urinary bladder.   Stomach/Bowel: Normal stomach, without wall thickening. Normal colon, appendix, and terminal ileum. Small bowel loops in the left side of the abdomen are upper normal caliber, without cause identified.   Vascular/Lymphatic: Aortic atherosclerosis.  Patent renal veins.   Abdominal retroperitoneal adenopathy. Index left periaortic node measures 0.7 x 1.5 cm on 117/5 versus versus 0.9 X 1.6 cm on the prior.   More caudal left periaortic node measures 8 mm on 137/5 and is similar to on the prior (when remeasured). No pelvic sidewall adenopathy.   Reproductive: Mild prostatomegaly.   Other: No significant free fluid.   Musculoskeletal: Degenerative partial fusion of the right sacroiliac joint. Mild disc bulge at L4-5 and L5-S1.   IMPRESSION: 1. Mild enlargement of the left renal mass with similar secondary upper pole left-sided  caliectasis. 2. Similar abdominal retroperitoneal adenopathy. 3. Similar expansile posterior right ninth rib lesion. 4. Similar and decrease in size of tiny pulmonary nodules. No new or progressive metastatic disease in the chest. 5. Aortic atherosclerosis (ICD10-I70.0) and emphysema (ICD10-J43.9).     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   He is currently on nivolumab maintenance which she has tolerated without any major complications.  Imaging studies in October 2023 showed overall very little residual disease except for his primary tumor.   Risks and benefits of continuing nivolumab therapy to complete 2 years were discussed he is agreeable to continue at this time.  Salvage nephrectomy would be considered if he continues to have very little residual disease.  If he has progression of disease on subsequent scans, adding oral targeted therapy such as axitinib or cabozantinib would be considered.  After discussion today, the plan is to continue with nivolumab maintenance till March 2024.  Repeat imaging studies will be recommended at that time if he has limited or no evidence of metastatic disease, salvage nephrectomy could be considered.  If he has progression of disease oral targeted therapy would be added.  He is agreeable and understands this plan.    2.  IV access: Peripheral veins are currently in use at this time.      3.  CNS surveillance: His initial imaging studies in 2022 did not show any evidence of metastasis.      4.  Goals of care and prognosis: His disease is incurable although aggressive measures are warranted given his excellent performance status and young age.   5.  Immune mediated complications: I continue to educate him about potential complication clued pneumonitis, colitis and thyroid disease.  6.  Hypertension: His blood pressure is under excellent control.  7.  Hypothyroidism: He is currently on thyroid replacement with normalization of his T4.  8.  Follow-up: He will return in 1 month for the next cycle of nivolumab.   30  minutes were dedicated to this visit.  The time was spent on updating disease status, treatment choices and outlining future plan of care reviewed.   Zola Button, MD 12/29/202312:39 PM

## 2022-07-30 LAB — T4: T4, Total: 6.9 ug/dL (ref 4.5–12.0)

## 2022-08-04 ENCOUNTER — Telehealth: Payer: Self-pay | Admitting: Internal Medicine

## 2022-08-04 NOTE — Telephone Encounter (Signed)
Called patient regarding new rescheduled 01/25 appointments, patient is notified.

## 2022-08-05 ENCOUNTER — Other Ambulatory Visit: Payer: Self-pay

## 2022-08-08 ENCOUNTER — Other Ambulatory Visit: Payer: Self-pay

## 2022-08-09 ENCOUNTER — Other Ambulatory Visit: Payer: Self-pay

## 2022-08-25 ENCOUNTER — Inpatient Hospital Stay: Payer: Commercial Managed Care - HMO | Admitting: Internal Medicine

## 2022-08-25 ENCOUNTER — Ambulatory Visit: Payer: Commercial Managed Care - HMO

## 2022-08-25 ENCOUNTER — Inpatient Hospital Stay: Payer: Commercial Managed Care - HMO

## 2022-08-25 ENCOUNTER — Other Ambulatory Visit: Payer: Commercial Managed Care - HMO

## 2022-08-25 ENCOUNTER — Ambulatory Visit: Payer: Commercial Managed Care - HMO | Admitting: Internal Medicine

## 2022-08-25 ENCOUNTER — Inpatient Hospital Stay: Payer: Commercial Managed Care - HMO | Attending: Oncology

## 2022-08-25 VITALS — BP 139/74 | HR 73 | Temp 97.8°F | Resp 16 | Ht 67.5 in | Wt 220.1 lb

## 2022-08-25 DIAGNOSIS — C642 Malignant neoplasm of left kidney, except renal pelvis: Secondary | ICD-10-CM

## 2022-08-25 DIAGNOSIS — C7800 Secondary malignant neoplasm of unspecified lung: Secondary | ICD-10-CM | POA: Insufficient documentation

## 2022-08-25 DIAGNOSIS — Z5112 Encounter for antineoplastic immunotherapy: Secondary | ICD-10-CM | POA: Insufficient documentation

## 2022-08-25 DIAGNOSIS — C649 Malignant neoplasm of unspecified kidney, except renal pelvis: Secondary | ICD-10-CM | POA: Insufficient documentation

## 2022-08-25 DIAGNOSIS — Z79899 Other long term (current) drug therapy: Secondary | ICD-10-CM | POA: Insufficient documentation

## 2022-08-25 LAB — TSH: TSH: 9.675 u[IU]/mL — ABNORMAL HIGH (ref 0.350–4.500)

## 2022-08-25 LAB — CBC WITH DIFFERENTIAL (CANCER CENTER ONLY)
Abs Immature Granulocytes: 0.03 10*3/uL (ref 0.00–0.07)
Basophils Absolute: 0.1 10*3/uL (ref 0.0–0.1)
Basophils Relative: 1 %
Eosinophils Absolute: 0.3 10*3/uL (ref 0.0–0.5)
Eosinophils Relative: 4 %
HCT: 40.7 % (ref 39.0–52.0)
Hemoglobin: 14.3 g/dL (ref 13.0–17.0)
Immature Granulocytes: 0 %
Lymphocytes Relative: 17 %
Lymphs Abs: 1.5 10*3/uL (ref 0.7–4.0)
MCH: 32.3 pg (ref 26.0–34.0)
MCHC: 35.1 g/dL (ref 30.0–36.0)
MCV: 91.9 fL (ref 80.0–100.0)
Monocytes Absolute: 0.6 10*3/uL (ref 0.1–1.0)
Monocytes Relative: 6 %
Neutro Abs: 6.3 10*3/uL (ref 1.7–7.7)
Neutrophils Relative %: 72 %
Platelet Count: 301 10*3/uL (ref 150–400)
RBC: 4.43 MIL/uL (ref 4.22–5.81)
RDW: 12.9 % (ref 11.5–15.5)
WBC Count: 8.7 10*3/uL (ref 4.0–10.5)
nRBC: 0 % (ref 0.0–0.2)

## 2022-08-25 LAB — CMP (CANCER CENTER ONLY)
ALT: 24 U/L (ref 0–44)
AST: 14 U/L — ABNORMAL LOW (ref 15–41)
Albumin: 4 g/dL (ref 3.5–5.0)
Alkaline Phosphatase: 64 U/L (ref 38–126)
Anion gap: 5 (ref 5–15)
BUN: 24 mg/dL — ABNORMAL HIGH (ref 6–20)
CO2: 27 mmol/L (ref 22–32)
Calcium: 9.4 mg/dL (ref 8.9–10.3)
Chloride: 105 mmol/L (ref 98–111)
Creatinine: 1.32 mg/dL — ABNORMAL HIGH (ref 0.61–1.24)
GFR, Estimated: >60 mL/min (ref >60)
Glucose, Bld: 132 mg/dL — ABNORMAL HIGH (ref 70–99)
Potassium: 4.2 mmol/L (ref 3.5–5.1)
Sodium: 137 mmol/L (ref 135–145)
Total Bilirubin: 0.3 mg/dL (ref 0.3–1.2)
Total Protein: 7.2 g/dL (ref 6.5–8.1)

## 2022-08-25 MED ORDER — SODIUM CHLORIDE 0.9 % IV SOLN: Freq: Once | INTRAVENOUS | Status: AC

## 2022-08-25 MED ORDER — SODIUM CHLORIDE 0.9 % IV SOLN
480.0000 mg | Freq: Once | INTRAVENOUS | Status: AC
  Administered 2022-08-25: 480 mg via INTRAVENOUS
  Filled 2022-08-25: qty 48

## 2022-08-25 NOTE — Patient Instructions (Signed)
[facility]  Discharge Instructions: Thank you for choosing [facility] to provide your oncology and hematology care.   If you have a lab appointment with the Cancer Center, please go directly to the Cancer Center and check in at the registration area.   Wear comfortable clothing and clothing appropriate for easy access to any Portacath or PICC line.   We strive to give you quality time with your provider. You may need to reschedule your appointment if you arrive late (15 or more minutes).  Arriving late affects you and other patients whose appointments are after yours.  Also, if you miss three or more appointments without notifying the office, you may be dismissed from the clinic at the provider's discretion.      For prescription refill requests, have your pharmacy contact our office and allow 72 hours for refills to be completed.    Today you received the following chemotherapy and/or immunotherapy agents: Nivolumab.       To help prevent nausea and vomiting after your treatment, we encourage you to take your nausea medication as directed.  BELOW ARE SYMPTOMS THAT SHOULD BE REPORTED IMMEDIATELY: *FEVER GREATER THAN 100.4 F (38 C) OR HIGHER *CHILLS OR SWEATING *NAUSEA AND VOMITING THAT IS NOT CONTROLLED WITH YOUR NAUSEA MEDICATION *UNUSUAL SHORTNESS OF BREATH *UNUSUAL BRUISING OR BLEEDING *URINARY PROBLEMS (pain or burning when urinating, or frequent urination) *BOWEL PROBLEMS (unusual diarrhea, constipation, pain near the anus) TENDERNESS IN MOUTH AND THROAT WITH OR WITHOUT PRESENCE OF ULCERS (sore throat, sores in mouth, or a toothache) UNUSUAL RASH, SWELLING OR PAIN  UNUSUAL VAGINAL DISCHARGE OR ITCHING   Items with * indicate a potential emergency and should be followed up as soon as possible or go to the Emergency Department if any problems should occur.  Please show the CHEMOTHERAPY ALERT CARD or IMMUNOTHERAPY ALERT CARD at  check-in to the Emergency Department and triage nurse.  Should you have questions after your visit or need to cancel or reschedule your appointment, please contact  [facility]  Dept: [phone]  and follow the prompts.  Office hours are 8:00 a.m. to 4:30 p.m. Monday - Friday. Please note that voicemails left after 4:00 p.m. may not be returned until the following business day.  We are closed weekends and major holidays. You have access to a nurse at all times for urgent questions. Please call the main number to the clinic Dept: [phone] and follow the prompts.   For any non-urgent questions, you may also contact your provider using MyChart. We now offer e-Visits for anyone 18 and older to request care online for non-urgent symptoms. For details visit [email or web address].   Also download the MyChart app! Go to the app store, search "MyChart", open the app, select , and log in with your MyChart username and password.   

## 2022-08-25 NOTE — Progress Notes (Signed)
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  PRIOR THERAPY:  1) Status post renal biopsy on September 14, 2020 confirming the presence of clear-cell renal cell carcinoma with rhabdoid features. 2) status post induction treatment with immunotherapy with ipilimumab 1 Mg/KG and nivolumab 3 mg/KG every 3 weeks for 4 cycles.  First dose was given on October 02, 2020.  CURRENT THERAPY: Maintenance treatment with nivolumab 480 Mg IV every 4 weeks.  Status post 24 cycles.  INTERVAL HISTORY: Bob Ewing 52 y.o. male came to the clinic today to establish care with me after his primary oncologist Dr. Alen Blew had left the practice.  The patient is feeling fine today with no concerning complaints except for occasional fatigue.  He has been tolerating his treatment with nivolumab fairly well.  He denied having any current chest pain, shortness of breath, cough or hemoptysis.  He has no nausea, vomiting, diarrhea or constipation.  He has no headache or visual changes.  He has no recent weight loss or night sweats.  He is originally from [place] and he works as a Administrator.  He is recently divorced but he financially support and takes care of his sister in [place].  He was worried a little bit about the coverage for his immunotherapy but I explained to the patient that there should be no change from what he used to have with Dr. Alen Blew.  The patient has no current history of smoking.  He is here today for evaluation before starting cycle #25 of his treatment.  MEDICAL HISTORY: Past Medical History:  Diagnosis Date   Cancer ([place])    Hypertension    Stroke ([place])     ALLERGIES:  has no active  allergies.  MEDICATIONS:  Current Outpatient Medications  Medication Sig Dispense Refill   aspirin 81 MG chewable tablet Chew 1 tablet (81 mg total) by mouth daily. 90 tablet 0   atenolol (TENORMIN) 50 MG tablet Take 1 tablet (50 mg total) by mouth daily. 90 tablet 3   hydrochlorothiazide (HYDRODIURIL) 25 MG tablet TAKE 1 TABLET(25 MG) BY MOUTH EVERY MORNING 90 tablet 1   HYDROcodone-acetaminophen (NORCO) 10-325 MG tablet Take 1 tablet by mouth every 4 (four) hours as needed for moderate pain or severe pain.     levothyroxine (SYNTHROID) 100 MCG tablet Take 1 tablet (100 mcg total) by mouth daily before breakfast. 90 tablet 3   metFORMIN (GLUCOPHAGE) 500 MG tablet TAKE 1 TABLET BY MOUTH DAILY 90 tablet 1   No current facility-administered medications for this visit.    SURGICAL HISTORY: No past surgical history on file.  REVIEW OF SYSTEMS:  Constitutional: negative Eyes: negative Ears, nose, mouth, throat, and face: negative Respiratory: negative Cardiovascular: negative Gastrointestinal: negative Genitourinary:negative Integument/breast: negative Hematologic/lymphatic: negative Musculoskeletal:negative Neurological: negative Behavioral/Psych: negative Endocrine: negative Allergic/Immunologic: negative   PHYSICAL EXAMINATION: General appearance: alert, cooperative, fatigued, and no distress Head: Normocephalic, without obvious abnormality, atraumatic Neck: no adenopathy, no JVD, supple, symmetrical, trachea midline, and thyroid not enlarged, symmetric, no tenderness/mass/nodules Lymph nodes: Cervical, supraclavicular, and axillary nodes normal. Resp: clear to auscultation bilaterally Back: symmetric, no curvature. ROM normal. No CVA tenderness. Cardio: regular rate and rhythm, S1, S2 normal, no murmur, click, rub or gallop GI: soft,  non-tender; bowel sounds normal; no masses,  no organomegaly Extremities: extremities normal, atraumatic, no cyanosis or edema Neurologic: Alert  and oriented X 3, normal strength and tone. Normal symmetric reflexes. Normal coordination and gait  ECOG PERFORMANCE STATUS: 1 - Symptomatic but completely ambulatory  Blood pressure 139/74, pulse 73, temperature 97.8 F (36.6 C), temperature source Temporal, resp. rate 16, height 5' 7.5" (1.715 m), weight 220 lb 1.6 oz (99.8 kg), SpO2 100 %.  LABORATORY DATA: Lab Results  Component Value Date   WBC 8.7 08/25/2022   HGB 14.3 08/25/2022   HCT 40.7 08/25/2022   MCV 91.9 08/25/2022   PLT 301 08/25/2022      Chemistry      Component Value Date/Time   NA 138 07/29/2022 1245   K 3.8 07/29/2022 1245   CL 105 07/29/2022 1245   CO2 28 07/29/2022 1245   BUN 18 07/29/2022 1245   CREATININE 1.04 07/29/2022 1245      Component Value Date/Time   CALCIUM 9.6 07/29/2022 1245   ALKPHOS 62 07/29/2022 1245   AST 20 07/29/2022 1245   ALT 29 07/29/2022 1245   BILITOT 0.3 07/29/2022 1245       RADIOGRAPHIC STUDIES: No results found.  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  The patient started induction treatment with immunotherapy with ipilimumab 1 Mg/KG and nivolumab 3 mg/KG every 3 weeks for 4 cycles.  First dose was given on October 02, 2020.  He is currently undergoing maintenance treatment with nivolumab 480 Mg IV every 4 weeks.  Status post 24 cycles. The patient has been tolerating this treatment well with no concerning adverse effects. I recommended for him to proceed with cycle #25 today as planned. I will see him back for follow-up visit in 4 weeks for evaluation before starting the next cycle of his treatment. The patient was advised to call  immediately if he has any concerning symptoms in the interval. The patient voices understanding of current disease status and treatment options and is in agreement with the current care plan.  All questions were answered. The patient knows to call the clinic with any problems, questions or concerns. We can certainly see the patient much sooner if necessary. The total time spent in the appointment was 30 minutes.  Disclaimer: This note was dictated with voice recognition software. Similar sounding words can inadvertently be transcribed and may not be corrected upon review.

## 2022-08-26 ENCOUNTER — Other Ambulatory Visit: Payer: Self-pay

## 2022-08-27 ENCOUNTER — Encounter: Payer: Self-pay | Admitting: Oncology

## 2022-08-27 LAB — T4: T4, Total: 7.7 ug/dL (ref 4.5–12.0)

## 2022-09-05 ENCOUNTER — Other Ambulatory Visit: Payer: Self-pay | Admitting: Oncology

## 2022-09-06 ENCOUNTER — Telehealth: Payer: Self-pay | Admitting: Internal Medicine

## 2022-09-06 NOTE — Telephone Encounter (Signed)
Called patient regarding upcoming appointments, patient is notified. 

## 2022-09-20 ENCOUNTER — Other Ambulatory Visit: Payer: Self-pay

## 2022-09-20 NOTE — Progress Notes (Unsigned)
[facility] OFFICE PROGRESS NOTE  No primary care provider on file. No primary provider on file.  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   PRIOR THERAPY:  1) Status post renal biopsy on September 14, 2020 confirming the presence of clear-cell renal cell carcinoma with rhabdoid features. 2) status post induction treatment with immunotherapy with ipilimumab 1 Mg/KG and nivolumab 3 mg/KG every 3 weeks for 4 cycles.  First dose was given on October 02, 2020.  CURRENT THERAPY: Maintenance treatment with nivolumab 480 Mg IV every 4 weeks.  Status post 25 cycles   INTERVAL HISTORY: Bob Ewing 52 y.o. male returns to the clinic for a follow up visit. The patient is feeling well today without any concerning complaints. The patient continues to tolerate treatment with nivolumab well without any adverse effects. Denies any fever, chills, night sweats, or weight loss. Denies any chest pain, shortness of breath, cough, or hemoptysis. Denies any nausea, vomiting, diarrhea, or constipation. Denies any headache or visual changes. Denies any rashes or skin changes. He denies abdominal pain or back pain. Denies any dysuria.  The patient is here today for evaluation prior to starting cycle # 26  MEDICAL HISTORY: Past Medical History:  Diagnosis Date   Cancer ([place])    Hypertension    Stroke ([place])     ALLERGIES:  has no active allergies.  MEDICATIONS:  Current Outpatient Medications  Medication Sig Dispense Refill   aspirin 81 MG chewable tablet Chew 1 tablet (81 mg total) by mouth daily. 90 tablet 0   atenolol (TENORMIN) 50 MG tablet Take 1 tablet (50 mg total) by mouth daily. 90 tablet 3   hydrochlorothiazide (HYDRODIURIL) 25 MG tablet TAKE 1 TABLET(25 MG) BY MOUTH EVERY MORNING 90 tablet 1   HYDROcodone-acetaminophen (NORCO) 10-325 MG tablet Take 1  tablet by mouth every 4 (four) hours as needed for moderate pain or severe pain.     levothyroxine (SYNTHROID) 100 MCG tablet Take 1 tablet (100 mcg total) by mouth daily before breakfast. 90 tablet 3   metFORMIN (GLUCOPHAGE) 500 MG tablet TAKE 1 TABLET BY MOUTH DAILY 90 tablet 1   No current facility-administered medications for this visit.    SURGICAL HISTORY: No past surgical history on file.  REVIEW OF SYSTEMS:   Review of Systems  Constitutional: Negative for appetite change, chills, fatigue, fever and unexpected weight change.  HENT:   Negative for mouth sores, nosebleeds, sore throat and trouble swallowing.   Eyes: Negative for eye problems and icterus.  Respiratory: Negative for cough, hemoptysis, shortness of breath and wheezing.   Cardiovascular: Negative for chest pain and leg swelling.  Gastrointestinal: Negative for abdominal pain, constipation, diarrhea, nausea and vomiting.  Genitourinary: Negative for bladder incontinence, difficulty urinating, dysuria, frequency and hematuria.   Musculoskeletal: Negative for back pain, gait problem, neck pain and neck stiffness.  Skin: Negative for itching and rash.  Neurological: Negative for dizziness, extremity weakness, gait problem, headaches, light-headedness and seizures.  Hematological: Negative for adenopathy. Does not bruise/bleed easily.  Psychiatric/Behavioral: Negative for confusion, depression and sleep disturbance. The patient is not nervous/anxious.     PHYSICAL EXAMINATION:  There were no vitals taken for this visit.  ECOG PERFORMANCE STATUS: {CHL ONC ECOG [id number]  Physical Exam  Constitutional: Oriented to person, place, and time and well-developed, well-nourished, and in no distress. No distress.  HENT:  Head: Normocephalic and atraumatic.  Mouth/Throat: Oropharynx is clear and moist. No oropharyngeal exudate.  Eyes: Conjunctivae are normal. Right eye exhibits no discharge. Left eye exhibits no discharge.  No scleral icterus.  Neck: Normal range of motion. Neck supple.  Cardiovascular: Normal rate, regular rhythm, normal heart sounds and intact distal pulses.   Pulmonary/Chest: Effort normal and breath sounds normal. No respiratory distress. No wheezes. No rales.  Abdominal: Soft. Bowel sounds are normal. Exhibits no distension and no mass. There is no tenderness.  Musculoskeletal: Normal range of motion. Exhibits no edema.  Lymphadenopathy:    No cervical adenopathy.  Neurological: Alert and oriented to person, place, and time. Exhibits normal muscle tone. Gait normal. Coordination normal.  Skin: Skin is warm and dry. No rash noted. Not diaphoretic. No erythema. No pallor.  Psychiatric: Mood, memory and judgment normal.  Vitals reviewed.  LABORATORY DATA: Lab Results  Component Value Date   WBC 8.7 08/25/2022   HGB 14.3 08/25/2022   HCT 40.7 08/25/2022   MCV 91.9 08/25/2022   PLT 301 08/25/2022      Chemistry      Component Value Date/Time   NA 137 08/25/2022 1101   K 4.2 08/25/2022 1101   CL 105 08/25/2022 1101   CO2 27 08/25/2022 1101   BUN 24 (H) 08/25/2022 1101   CREATININE 1.32 (H) 08/25/2022 1101      Component Value Date/Time   CALCIUM 9.4 08/25/2022 1101   ALKPHOS 64 08/25/2022 1101   AST 14 (L) 08/25/2022 1101   ALT 24 08/25/2022 1101   BILITOT 0.3 08/25/2022 1101       RADIOGRAPHIC STUDIES:  No results found.   ASSESSMENT/PLAN:  This is a very pleasant 52 year old male diagnosed with stage IV renal cell carcinoma with rhabdoid features.  He was diagnosed in February 2022.  He presented with pulmonary involvement as well as mediastinal lymphadenopathy.  He is status post renal biopsy on 09/14/2020 which confirmed the presence of clear-cell renal cell carcinoma with rhabdoid features.  He then started induction treatment with immunotherapy ipilimumab 1 mg/kg and nivolumab 3 mg/kg IV every 3 weeks for 4 cycles.  The first dose was given on 10/02/2020.  He is  currently on maintenance treatment with nivolumab 480 mg IV every 4 weeks.  Status post 25 cycles.  The patient was seen with Dr. Julien Nordmann today.  Labs were reviewed.  Recommend that he continue with cycle #26 today's schedule.  We will see him back for follow-up visit in 4 weeks for evaluation repeat blood work before undergoing cycle #27.  At Dr. Julien Nordmann how often rescan these patients ***  The patient was advised to call immediately if she has any concerning symptoms in the interval. The patient voices understanding of current disease status and treatment options and is in agreement with the current care plan. All questions were answered. The patient knows to call the clinic with any problems, questions or concerns. We can certainly see the patient much sooner if necessary       No orders of the defined types were placed in this encounter.    I spent {CHL ONC TIME VISIT - [id number] counseling the patient face to face. The total time spent in the appointment was {CHL ONC TIME VISIT - [id number].  Cher Egnor L Marely Apgar, PA-C 09/20/22

## 2022-09-21 ENCOUNTER — Telehealth: Payer: Self-pay | Admitting: Medical Oncology

## 2022-09-21 NOTE — Telephone Encounter (Signed)
Pt asked if his treatment plan was authorized by his insurance. Message sent to [employer].

## 2022-09-22 ENCOUNTER — Inpatient Hospital Stay (HOSPITAL_BASED_OUTPATIENT_CLINIC_OR_DEPARTMENT_OTHER): Payer: Commercial Managed Care - HMO | Admitting: Physician Assistant

## 2022-09-22 ENCOUNTER — Inpatient Hospital Stay: Payer: Commercial Managed Care - HMO | Attending: Oncology

## 2022-09-22 ENCOUNTER — Inpatient Hospital Stay: Payer: Commercial Managed Care - HMO

## 2022-09-22 VITALS — BP 137/78 | HR 69 | Resp 17

## 2022-09-22 VITALS — BP 134/76 | HR 65 | Temp 97.5°F | Resp 16 | Wt 223.0 lb

## 2022-09-22 DIAGNOSIS — C642 Malignant neoplasm of left kidney, except renal pelvis: Secondary | ICD-10-CM

## 2022-09-22 DIAGNOSIS — Z8673 Personal history of transient ischemic attack (TIA), and cerebral infarction without residual deficits: Secondary | ICD-10-CM | POA: Diagnosis not present

## 2022-09-22 DIAGNOSIS — Z79899 Other long term (current) drug therapy: Secondary | ICD-10-CM | POA: Diagnosis not present

## 2022-09-22 DIAGNOSIS — C649 Malignant neoplasm of unspecified kidney, except renal pelvis: Secondary | ICD-10-CM | POA: Insufficient documentation

## 2022-09-22 DIAGNOSIS — Z5112 Encounter for antineoplastic immunotherapy: Secondary | ICD-10-CM | POA: Insufficient documentation

## 2022-09-22 DIAGNOSIS — I10 Essential (primary) hypertension: Secondary | ICD-10-CM | POA: Insufficient documentation

## 2022-09-22 LAB — CMP (CANCER CENTER ONLY)
ALT: 25 U/L (ref 0–44)
AST: 14 U/L — ABNORMAL LOW (ref 15–41)
Albumin: 4.3 g/dL (ref 3.5–5.0)
Alkaline Phosphatase: 65 U/L (ref 38–126)
Anion gap: 6 (ref 5–15)
BUN: 22 mg/dL — ABNORMAL HIGH (ref 6–20)
CO2: 29 mmol/L (ref 22–32)
Calcium: 9.1 mg/dL (ref 8.9–10.3)
Chloride: 103 mmol/L (ref 98–111)
Creatinine: 1.05 mg/dL (ref 0.61–1.24)
GFR, Estimated: >60 mL/min (ref >60)
Glucose, Bld: 145 mg/dL — ABNORMAL HIGH (ref 70–99)
Potassium: 3.9 mmol/L (ref 3.5–5.1)
Sodium: 138 mmol/L (ref 135–145)
Total Bilirubin: 0.3 mg/dL (ref 0.3–1.2)
Total Protein: 7.1 g/dL (ref 6.5–8.1)

## 2022-09-22 LAB — CBC WITH DIFFERENTIAL (CANCER CENTER ONLY)
Abs Immature Granulocytes: 0.04 10*3/uL (ref 0.00–0.07)
Basophils Absolute: 0.1 10*3/uL (ref 0.0–0.1)
Basophils Relative: 1 %
Eosinophils Absolute: 0.2 10*3/uL (ref 0.0–0.5)
Eosinophils Relative: 3 %
HCT: 42 % (ref 39.0–52.0)
Hemoglobin: 14.4 g/dL (ref 13.0–17.0)
Immature Granulocytes: 1 %
Lymphocytes Relative: 17 %
Lymphs Abs: 1.3 10*3/uL (ref 0.7–4.0)
MCH: 32.1 pg (ref 26.0–34.0)
MCHC: 34.3 g/dL (ref 30.0–36.0)
MCV: 93.8 fL (ref 80.0–100.0)
Monocytes Absolute: 0.4 10*3/uL (ref 0.1–1.0)
Monocytes Relative: 5 %
Neutro Abs: 6 10*3/uL (ref 1.7–7.7)
Neutrophils Relative %: 73 %
Platelet Count: 273 10*3/uL (ref 150–400)
RBC: 4.48 MIL/uL (ref 4.22–5.81)
RDW: 12.7 % (ref 11.5–15.5)
WBC Count: 8 10*3/uL (ref 4.0–10.5)
nRBC: 0 % (ref 0.0–0.2)

## 2022-09-22 LAB — TSH: TSH: 12.885 u[IU]/mL — ABNORMAL HIGH (ref 0.350–4.500)

## 2022-09-22 MED ORDER — SODIUM CHLORIDE 0.9 % IV SOLN: Freq: Once | INTRAVENOUS | Status: AC

## 2022-09-22 MED ORDER — SODIUM CHLORIDE 0.9 % IV SOLN
480.0000 mg | Freq: Once | INTRAVENOUS | Status: AC
  Administered 2022-09-22: 480 mg via INTRAVENOUS
  Filled 2022-09-22: qty 48

## 2022-09-22 NOTE — Progress Notes (Signed)
Patient seen by PA today  Vitals are within treatment parameters.  Labs reviewed: and are within treatment parameters.  Per physician team, patient is ready for treatment and there are NO modifications to the treatment plan.

## 2022-09-23 ENCOUNTER — Other Ambulatory Visit: Payer: Self-pay | Admitting: Physician Assistant

## 2022-09-23 ENCOUNTER — Telehealth: Payer: Self-pay

## 2022-09-23 NOTE — Telephone Encounter (Signed)
-----   Message from [employer], PA-C sent at 09/22/2022  2:12 PM EST ----- Can you call him and make sure he is taking his synthroid daily? Can you also confirm the dose? If he is taking it, I will need to increase the dose. Let me know  ----- Message ----- From: Interface, Lab In [place] Sent: 09/22/2022  10:22 AM EST To: [name], MD

## 2022-09-23 NOTE — Telephone Encounter (Signed)
This nurse reached out to this patient.  Verified that patient is taking his Synthroid daily.  Informed patient that his TSH level is elevated on his most recent lab results and the provider is going to increase the dose.  Patient asked this nurse what the level is and he was made aware that it is 12.885.  Patient states "I am not sure if she knows to look but my levels started out a lot higher then that so medicine may not need to be changed".  This nurse informed patient that his levels had trended down and is now going back up.  Patient stated "Fine, Thank You".  This nurse informed that the provider will be sending a prescription to increase the dose of his Synthroid.  Patient acknowledged.  No further questions or concerns.

## 2022-09-24 LAB — T4: T4, Total: 6.7 ug/dL (ref 4.5–12.0)

## 2022-09-27 ENCOUNTER — Telehealth: Payer: Self-pay | Admitting: Internal Medicine

## 2022-09-27 NOTE — Telephone Encounter (Signed)
Called patient regarding upcoming March/April appointments, patient is notified.

## 2022-09-28 ENCOUNTER — Other Ambulatory Visit: Payer: Self-pay

## 2022-10-20 ENCOUNTER — Inpatient Hospital Stay: Payer: Commercial Managed Care - HMO

## 2022-10-20 ENCOUNTER — Inpatient Hospital Stay (HOSPITAL_BASED_OUTPATIENT_CLINIC_OR_DEPARTMENT_OTHER): Payer: Commercial Managed Care - HMO | Admitting: Internal Medicine

## 2022-10-20 ENCOUNTER — Inpatient Hospital Stay: Payer: Commercial Managed Care - HMO | Attending: Oncology

## 2022-10-20 ENCOUNTER — Other Ambulatory Visit: Payer: Self-pay

## 2022-10-20 DIAGNOSIS — C642 Malignant neoplasm of left kidney, except renal pelvis: Secondary | ICD-10-CM | POA: Diagnosis not present

## 2022-10-20 DIAGNOSIS — Z5112 Encounter for antineoplastic immunotherapy: Secondary | ICD-10-CM | POA: Insufficient documentation

## 2022-10-20 DIAGNOSIS — Z79899 Other long term (current) drug therapy: Secondary | ICD-10-CM | POA: Insufficient documentation

## 2022-10-20 DIAGNOSIS — C7800 Secondary malignant neoplasm of unspecified lung: Secondary | ICD-10-CM | POA: Diagnosis not present

## 2022-10-20 DIAGNOSIS — C649 Malignant neoplasm of unspecified kidney, except renal pelvis: Secondary | ICD-10-CM | POA: Insufficient documentation

## 2022-10-20 DIAGNOSIS — I10 Essential (primary) hypertension: Secondary | ICD-10-CM | POA: Insufficient documentation

## 2022-10-20 LAB — CBC WITH DIFFERENTIAL (CANCER CENTER ONLY)
Abs Immature Granulocytes: 0.04 10*3/uL (ref 0.00–0.07)
Basophils Absolute: 0.1 10*3/uL (ref 0.0–0.1)
Basophils Relative: 1 %
Eosinophils Absolute: 0.6 10*3/uL — ABNORMAL HIGH (ref 0.0–0.5)
Eosinophils Relative: 7 %
HCT: 42.2 % (ref 39.0–52.0)
Hemoglobin: 14.5 g/dL (ref 13.0–17.0)
Immature Granulocytes: 1 %
Lymphocytes Relative: 19 %
Lymphs Abs: 1.5 10*3/uL (ref 0.7–4.0)
MCH: 31.8 pg (ref 26.0–34.0)
MCHC: 34.4 g/dL (ref 30.0–36.0)
MCV: 92.5 fL (ref 80.0–100.0)
Monocytes Absolute: 0.4 10*3/uL (ref 0.1–1.0)
Monocytes Relative: 5 %
Neutro Abs: 5.2 10*3/uL (ref 1.7–7.7)
Neutrophils Relative %: 67 %
Platelet Count: 285 10*3/uL (ref 150–400)
RBC: 4.56 MIL/uL (ref 4.22–5.81)
RDW: 12.6 % (ref 11.5–15.5)
WBC Count: 7.8 10*3/uL (ref 4.0–10.5)
nRBC: 0 % (ref 0.0–0.2)

## 2022-10-20 LAB — CMP (CANCER CENTER ONLY)
ALT: 27 U/L (ref 0–44)
AST: 14 U/L — ABNORMAL LOW (ref 15–41)
Albumin: 4.1 g/dL (ref 3.5–5.0)
Alkaline Phosphatase: 63 U/L (ref 38–126)
Anion gap: 8 (ref 5–15)
BUN: 25 mg/dL — ABNORMAL HIGH (ref 6–20)
CO2: 26 mmol/L (ref 22–32)
Calcium: 9.3 mg/dL (ref 8.9–10.3)
Chloride: 107 mmol/L (ref 98–111)
Creatinine: 1.13 mg/dL (ref 0.61–1.24)
GFR, Estimated: >60 mL/min (ref >60)
Glucose, Bld: 180 mg/dL — ABNORMAL HIGH (ref 70–99)
Potassium: 3.6 mmol/L (ref 3.5–5.1)
Sodium: 141 mmol/L (ref 135–145)
Total Bilirubin: 0.2 mg/dL — ABNORMAL LOW (ref 0.3–1.2)
Total Protein: 6.7 g/dL (ref 6.5–8.1)

## 2022-10-20 LAB — TSH: TSH: 8.83 u[IU]/mL — ABNORMAL HIGH (ref 0.350–4.500)

## 2022-10-20 MED ORDER — SODIUM CHLORIDE 0.9 % IV SOLN
480.0000 mg | Freq: Once | INTRAVENOUS | Status: AC
  Administered 2022-10-20: 480 mg via INTRAVENOUS
  Filled 2022-10-20: qty 48

## 2022-10-20 MED ORDER — SODIUM CHLORIDE 0.9 % IV SOLN: Freq: Once | INTRAVENOUS | Status: AC

## 2022-10-20 NOTE — Patient Instructions (Signed)
[facility]  Discharge Instructions: Thank you for choosing [facility] to provide your oncology and hematology care.   If you have a lab appointment with the Cancer Center, please go directly to the Cancer Center and check in at the registration area.   Wear comfortable clothing and clothing appropriate for easy access to any Portacath or PICC line.   We strive to give you quality time with your provider. You may need to reschedule your appointment if you arrive late (15 or more minutes).  Arriving late affects you and other patients whose appointments are after yours.  Also, if you miss three or more appointments without notifying the office, you may be dismissed from the clinic at the provider's discretion.      For prescription refill requests, have your pharmacy contact our office and allow 72 hours for refills to be completed.    Today you received the following chemotherapy and/or immunotherapy agents: Nivolumab.       To help prevent nausea and vomiting after your treatment, we encourage you to take your nausea medication as directed.  BELOW ARE SYMPTOMS THAT SHOULD BE REPORTED IMMEDIATELY: *FEVER GREATER THAN 100.4 F (38 C) OR HIGHER *CHILLS OR SWEATING *NAUSEA AND VOMITING THAT IS NOT CONTROLLED WITH YOUR NAUSEA MEDICATION *UNUSUAL SHORTNESS OF BREATH *UNUSUAL BRUISING OR BLEEDING *URINARY PROBLEMS (pain or burning when urinating, or frequent urination) *BOWEL PROBLEMS (unusual diarrhea, constipation, pain near the anus) TENDERNESS IN MOUTH AND THROAT WITH OR WITHOUT PRESENCE OF ULCERS (sore throat, sores in mouth, or a toothache) UNUSUAL RASH, SWELLING OR PAIN  UNUSUAL VAGINAL DISCHARGE OR ITCHING   Items with * indicate a potential emergency and should be followed up as soon as possible or go to the Emergency Department if any problems should occur.  Please show the CHEMOTHERAPY ALERT CARD or IMMUNOTHERAPY ALERT CARD at  check-in to the Emergency Department and triage nurse.  Should you have questions after your visit or need to cancel or reschedule your appointment, please contact [facility]  Dept: [phone]  and follow the prompts.  Office hours are 8:00 a.m. to 4:30 p.m. Monday - Friday. Please note that voicemails left after 4:00 p.m. may not be returned until the following business day.  We are closed weekends and major holidays. You have access to a nurse at all times for urgent questions. Please call the main number to the clinic Dept: [phone] and follow the prompts.   For any non-urgent questions, you may also contact your provider using MyChart. We now offer e-Visits for anyone 18 and older to request care online for non-urgent symptoms. For details visit [email or web address].   Also download the MyChart app! Go to the app store, search "MyChart", open the app, select [place], and log in with your MyChart username and password.  

## 2022-10-20 NOTE — Progress Notes (Signed)
Patient seen by MD today  Vitals are within treatment parameters.  Labs reviewed: and are within treatment parameters.  Per physician team, patient is ready for treatment and there are NO modifications to the treatment plan.  

## 2022-10-20 NOTE — Progress Notes (Signed)
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  PRIOR THERAPY:  1) Status post renal biopsy on September 14, 2020 confirming the presence of clear-cell renal cell carcinoma with rhabdoid features. 2) status post induction treatment with immunotherapy with ipilimumab 1 Mg/KG and nivolumab 3 mg/KG every 3 weeks for 4 cycles.  First dose was given on October 02, 2020.  CURRENT THERAPY: Maintenance treatment with nivolumab 480 Mg IV every 4 weeks.  Status post 26 cycles.  INTERVAL HISTORY: Bob Ewing 52 y.o. male here for follow-up visit.  The patient is feeling fine today with no concerning complaints except for occasional pain on the left side chest wall after cough.  He denied having any current shortness of breath, or hemoptysis.  He has no nausea, vomiting, diarrhea or constipation.  He has no headache or visual changes.  He has no recent weight loss or night sweats.  He is here today for evaluation before starting cycle #27 of his treatment.  MEDICAL HISTORY: Past Medical History:  Diagnosis Date   Cancer ([facility])    Hypertension    Stroke ([facility])     ALLERGIES:  has no active allergies.  MEDICATIONS:  Current Outpatient Medications  Medication Sig Dispense Refill   atenolol (TENORMIN) 50 MG tablet Take 1 tablet (50 mg total) by mouth daily. 90 tablet 3   hydrochlorothiazide (HYDRODIURIL) 25 MG tablet TAKE 1 TABLET(25 MG) BY MOUTH EVERY MORNING 90 tablet 1   HYDROcodone-acetaminophen (NORCO) 10-325 MG tablet Take 1 tablet by mouth every 4 (four) hours as needed for moderate pain or severe pain.     levothyroxine (SYNTHROID) 150 MCG tablet Take 150 mcg by mouth daily.     metFORMIN (GLUCOPHAGE)  500 MG tablet TAKE 1 TABLET BY MOUTH DAILY 90 tablet 1   No current facility-administered medications for this visit.    SURGICAL HISTORY: No past surgical history on file.  REVIEW OF SYSTEMS:  A comprehensive review of systems was negative except for: Respiratory: positive for cough and pleurisy/chest pain   PHYSICAL EXAMINATION: General appearance: alert, cooperative, fatigued, and no distress Head: Normocephalic, without obvious abnormality, atraumatic Neck: no adenopathy, no JVD, supple, symmetrical, trachea midline, and thyroid not enlarged, symmetric, no tenderness/mass/nodules Lymph nodes: Cervical, supraclavicular, and axillary nodes normal. Resp: clear to auscultation bilaterally Back: symmetric, no curvature. ROM normal. No CVA tenderness. Cardio: regular rate and rhythm, S1, S2 normal, no murmur, click, rub or gallop GI: soft, non-tender; bowel sounds normal; no masses,  no organomegaly Extremities: extremities normal, atraumatic, no cyanosis or edema  ECOG PERFORMANCE STATUS: 1 - Symptomatic but completely ambulatory  Blood pressure 137/85, pulse 73, temperature 98.4 F (36.9 C), temperature source Oral, resp. rate 18, weight 224 lb 1 oz (101.6 kg), SpO2 99 %.  LABORATORY DATA: Lab Results  Component Value Date   WBC 7.8 10/20/2022   HGB 14.5 10/20/2022   HCT 42.2 10/20/2022   MCV 92.5 10/20/2022   PLT 285 10/20/2022      Chemistry      Component Value Date/Time   NA 138 09/22/2022 1011   K 3.9 09/22/2022 1011   CL 103 09/22/2022 1011   CO2 29 09/22/2022 1011   BUN 22 (H) 09/22/2022 1011  CREATININE 1.05 09/22/2022 1011      Component Value Date/Time   CALCIUM 9.1 09/22/2022 1011   ALKPHOS 65 09/22/2022 1011   AST 14 (L) 09/22/2022 1011   ALT 25 09/22/2022 1011   BILITOT 0.3 09/22/2022 1011       RADIOGRAPHIC STUDIES: No results found.  ASSESSMENT AND PLAN: This is a very pleasant 52 years old white male with Stage IV clear-cell renal cell  carcinoma with rhabdoid features diagnosed in February 2022 and presented with pulmonary involvement as well as mediastinal lymphadenopathy.  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  The patient started induction treatment with immunotherapy with ipilimumab 1 Mg/KG and nivolumab 3 mg/KG every 3 weeks for 4 cycles.  First dose was given on October 02, 2020.  He is currently undergoing maintenance treatment with nivolumab 480 Mg IV every 4 weeks.  Status post 26 cycles. The patient has been tolerating this treatment well with no concerning adverse effects. I recommended for him to proceed with cycle #27 today as planned. I will see him back for follow-up visit in 4 weeks for evaluation before the next cycle of his treatment. I may consider repeating imaging studies after the next cycle of his treatment. The patient was advised to call immediately if he has any concerning symptoms in the interval. The patient voices understanding of current disease status and treatment options and is in agreement with the current care plan.  All questions were answered. The patient knows to call the clinic with any problems, questions or concerns. We can certainly see the patient much sooner if necessary. The total time spent in the appointment was 20 minutes.  Disclaimer: This note was dictated with voice recognition software. Similar sounding words can inadvertently be transcribed and may not be corrected upon review.

## 2022-10-22 LAB — T4: T4, Total: 7.3 ug/dL (ref 4.5–12.0)

## 2022-11-01 ENCOUNTER — Other Ambulatory Visit: Payer: Self-pay

## 2022-11-05 ENCOUNTER — Other Ambulatory Visit: Payer: Self-pay

## 2022-11-17 ENCOUNTER — Other Ambulatory Visit: Payer: Commercial Managed Care - HMO

## 2022-11-17 ENCOUNTER — Inpatient Hospital Stay (HOSPITAL_BASED_OUTPATIENT_CLINIC_OR_DEPARTMENT_OTHER): Payer: Commercial Managed Care - HMO | Admitting: Internal Medicine

## 2022-11-17 ENCOUNTER — Encounter: Payer: Self-pay | Admitting: Medical Oncology

## 2022-11-17 ENCOUNTER — Inpatient Hospital Stay: Payer: Commercial Managed Care - HMO | Attending: Oncology

## 2022-11-17 ENCOUNTER — Inpatient Hospital Stay: Payer: Commercial Managed Care - HMO

## 2022-11-17 ENCOUNTER — Ambulatory Visit: Payer: Commercial Managed Care - HMO | Admitting: Physician Assistant

## 2022-11-17 VITALS — BP 132/84 | HR 74 | Temp 98.2°F | Resp 16 | Wt 225.8 lb

## 2022-11-17 VITALS — BP 143/89 | HR 73 | Resp 17

## 2022-11-17 DIAGNOSIS — Z5112 Encounter for antineoplastic immunotherapy: Secondary | ICD-10-CM | POA: Insufficient documentation

## 2022-11-17 DIAGNOSIS — Z79620 Long term (current) use of immunosuppressive biologic: Secondary | ICD-10-CM | POA: Insufficient documentation

## 2022-11-17 DIAGNOSIS — C642 Malignant neoplasm of left kidney, except renal pelvis: Secondary | ICD-10-CM

## 2022-11-17 DIAGNOSIS — C649 Malignant neoplasm of unspecified kidney, except renal pelvis: Secondary | ICD-10-CM | POA: Diagnosis not present

## 2022-11-17 LAB — CMP (CANCER CENTER ONLY)
ALT: 26 U/L (ref 0–44)
AST: 14 U/L — ABNORMAL LOW (ref 15–41)
Albumin: 4 g/dL (ref 3.5–5.0)
Alkaline Phosphatase: 62 U/L (ref 38–126)
Anion gap: 6 (ref 5–15)
BUN: 21 mg/dL — ABNORMAL HIGH (ref 6–20)
CO2: 26 mmol/L (ref 22–32)
Calcium: 8.8 mg/dL — ABNORMAL LOW (ref 8.9–10.3)
Chloride: 105 mmol/L (ref 98–111)
Creatinine: 1.09 mg/dL (ref 0.61–1.24)
GFR, Estimated: >60 mL/min (ref >60)
Glucose, Bld: 182 mg/dL — ABNORMAL HIGH (ref 70–99)
Potassium: 3.9 mmol/L (ref 3.5–5.1)
Sodium: 137 mmol/L (ref 135–145)
Total Bilirubin: 0.4 mg/dL (ref 0.3–1.2)
Total Protein: 6.8 g/dL (ref 6.5–8.1)

## 2022-11-17 LAB — CBC WITH DIFFERENTIAL (CANCER CENTER ONLY)
Abs Immature Granulocytes: 0.03 10*3/uL (ref 0.00–0.07)
Basophils Absolute: 0.1 10*3/uL (ref 0.0–0.1)
Basophils Relative: 1 %
Eosinophils Absolute: 0.3 10*3/uL (ref 0.0–0.5)
Eosinophils Relative: 4 %
HCT: 40.6 % (ref 39.0–52.0)
Hemoglobin: 13.9 g/dL (ref 13.0–17.0)
Immature Granulocytes: 0 %
Lymphocytes Relative: 16 %
Lymphs Abs: 1.3 10*3/uL (ref 0.7–4.0)
MCH: 31.8 pg (ref 26.0–34.0)
MCHC: 34.2 g/dL (ref 30.0–36.0)
MCV: 92.9 fL (ref 80.0–100.0)
Monocytes Absolute: 0.4 10*3/uL (ref 0.1–1.0)
Monocytes Relative: 5 %
Neutro Abs: 6 10*3/uL (ref 1.7–7.7)
Neutrophils Relative %: 74 %
Platelet Count: 260 10*3/uL (ref 150–400)
RBC: 4.37 MIL/uL (ref 4.22–5.81)
RDW: 12.8 % (ref 11.5–15.5)
WBC Count: 8.1 10*3/uL (ref 4.0–10.5)
nRBC: 0 % (ref 0.0–0.2)

## 2022-11-17 LAB — TSH: TSH: 7.67 u[IU]/mL — ABNORMAL HIGH (ref 0.350–4.500)

## 2022-11-17 MED ORDER — SODIUM CHLORIDE 0.9 % IV SOLN: Freq: Once | INTRAVENOUS | Status: AC

## 2022-11-17 MED ORDER — SODIUM CHLORIDE 0.9 % IV SOLN
480.0000 mg | Freq: Once | INTRAVENOUS | Status: AC
  Administered 2022-11-17: 480 mg via INTRAVENOUS
  Filled 2022-11-17: qty 48

## 2022-11-17 NOTE — Progress Notes (Signed)
Patient seen by Dr. Mohamed  Vitals are within treatment parameters.  Labs reviewed: and are within treatment parameters.  Per physician team, patient is ready for treatment and there are NO modifications to the treatment plan.  

## 2022-11-17 NOTE — Progress Notes (Signed)
[facility] Telephone:([phone]   Fax:(336) [phone]  OFFICE PROGRESS NOTE  Bob Livings, MD 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  PRIOR THERAPY:  1) Status post renal biopsy on September 14, 2020 confirming the presence of clear-cell renal cell carcinoma with rhabdoid features. 2) status post induction treatment with immunotherapy with ipilimumab 1 Mg/KG and nivolumab 3 mg/KG every 3 weeks for 4 cycles.  First dose was given on October 02, 2020.  CURRENT THERAPY: Maintenance treatment with nivolumab 480 Mg IV every 4 weeks.  Status post 27 cycles.  INTERVAL HISTORY: Bob Ewing 52 y.o. male returns to the clinic today for follow-up visit.  The patient is feeling fine today with no concerning complaints except for mild tooth ache on the left side.  He denied having any current chest pain, shortness of breath, cough or hemoptysis.  He has no nausea, vomiting, diarrhea or constipation.  He has no headache or visual changes.  He continues to tolerate his treatment with immunotherapy fairly well.  The patient is here today for evaluation before starting cycle #28.   MEDICAL HISTORY: Past Medical History:  Diagnosis Date   Cancer ([facility])    Hypertension    Stroke ([facility])     ALLERGIES:  has no active allergies.  MEDICATIONS:  Current Outpatient Medications  Medication Sig Dispense Refill   atenolol (TENORMIN) 50 MG tablet Take 1 tablet (50 mg total) by mouth daily. 90 tablet 3   hydrochlorothiazide (HYDRODIURIL) 25 MG tablet TAKE 1 TABLET(25 MG) BY MOUTH EVERY MORNING 90 tablet 1   HYDROcodone-acetaminophen (NORCO) 10-325 MG tablet Take 1 tablet by mouth every 4 (four) hours as needed for moderate pain or severe pain.     levothyroxine (SYNTHROID) 150 MCG tablet Take 150 mcg by  mouth daily.     metFORMIN (GLUCOPHAGE) 500 MG tablet TAKE 1 TABLET BY MOUTH DAILY 90 tablet 1   No current facility-administered medications for this visit.    SURGICAL HISTORY: No past surgical history on file.  REVIEW OF SYSTEMS:  A comprehensive review of systems was negative except for: Ears, nose, mouth, throat, and face: positive for dental ache    PHYSICAL EXAMINATION: General appearance: alert, cooperative, and no distress Head: Normocephalic, without obvious abnormality, atraumatic Neck: no adenopathy, no JVD, supple, symmetrical, trachea midline, and thyroid not enlarged, symmetric, no tenderness/mass/nodules Lymph nodes: Cervical, supraclavicular, and axillary nodes normal. Resp: clear to auscultation bilaterally Back: symmetric, no curvature. ROM normal. No CVA tenderness. Cardio: regular rate and rhythm, S1, S2 normal, no murmur, click, rub or gallop GI: soft, non-tender; bowel sounds normal; no masses,  no organomegaly Extremities: extremities normal, atraumatic, no cyanosis or edema  ECOG PERFORMANCE STATUS: 1 - Symptomatic but completely ambulatory  Blood pressure 132/84, pulse 74, temperature 98.2 F (36.8 C), temperature source Oral, resp. rate 16, weight 225 lb 12.8 oz (102.4 kg), SpO2 100 %.  LABORATORY DATA: Lab Results  Component Value Date   WBC 7.8 10/20/2022   HGB 14.5 10/20/2022   HCT 42.2 10/20/2022   MCV 92.5 10/20/2022   PLT 285 10/20/2022      Chemistry      Component Value Date/Time   NA 141 10/20/2022 1409   K 3.6 10/20/2022 1409   CL 107 10/20/2022 1409   CO2 26 10/20/2022 1409  BUN 25 (H) 10/20/2022 1409   CREATININE 1.13 10/20/2022 1409      Component Value Date/Time   CALCIUM 9.3 10/20/2022 1409   ALKPHOS 63 10/20/2022 1409   AST 14 (L) 10/20/2022 1409   ALT 27 10/20/2022 1409   BILITOT 0.2 (L) 10/20/2022 1409       RADIOGRAPHIC STUDIES: No results found.  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  The patient started induction treatment with immunotherapy with ipilimumab 1 Mg/KG and nivolumab 3 mg/KG every 3 weeks for 4 cycles.  First dose was given on October 02, 2020.  He is currently undergoing maintenance treatment with nivolumab 480 Mg IV every 4 weeks.  Status post 27 cycles. The patient has been tolerating this treatment well with no concerning adverse effects. I recommended for him to proceed with cycle #28 today as planned. I will see him back for follow-up visit in 4 weeks for evaluation before starting cycle #29.  Will consider repeating his imaging studies after cycle #30. For the dental issue he was advised to reach out to his local dentist for further evaluation. The patient was advised to call immediately if he has any other concerning symptoms in the interval. The patient voices understanding of current disease status and treatment options and is in agreement with the current care plan.  All questions were answered. The patient knows to call the clinic with any problems, questions or concerns. We can certainly see the patient much sooner if necessary. The total time spent in the appointment was 20 minutes.  Disclaimer: This note was dictated with voice recognition software. Similar sounding words can inadvertently be transcribed and may not be corrected upon review.

## 2022-11-19 ENCOUNTER — Other Ambulatory Visit: Payer: Self-pay

## 2022-11-19 LAB — T4: T4, Total: 6.8 ug/dL (ref 4.5–12.0)

## 2022-12-07 ENCOUNTER — Other Ambulatory Visit: Payer: Self-pay

## 2022-12-12 NOTE — Progress Notes (Unsigned)
[facility] OFFICE PROGRESS NOTE  [name], MD 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   PRIOR THERAPY: 1) Status post renal biopsy on September 14, 2020 confirming the presence of clear-cell renal cell carcinoma with rhabdoid features. 2) status post induction treatment with immunotherapy with ipilimumab 1 Mg/KG and nivolumab 3 mg/KG every 3 weeks for 4 cycles. First dose was given on October 02, 2020.  CURRENT THERAPY: Maintenance treatment with nivolumab 480 Mg IV every 4 weeks.  Status post 28 cycles   INTERVAL HISTORY: Bob Ewing 52 y.o. male returns to the clinic for a follow up visit. The patient is feeling well today without any concerning complaints except he has questions regarding prognosis. He is concerned for his sister in [place] and wanting to ensure his finances are taken care of should something happen to him. He is wondering about how often disease progression occurs.   The patient continues to tolerate treatment with nivolumab well without any adverse effects. Denies any fever, chills, or weight loss. He intermittently has night sweats. He reports his pharmacy dispensed the wrong dose of his synthroid. He has been taking 150 mcg for about 4 months and they dispensed an old dose of 100 mcg. He is wondering what dose he should take. Denies any chest pain, shortness of breath, or hemoptysis.  He is a current smoker and has a chronic cough.  He states that his cough is stable.  He sometimes has mild nausea if he goes for long durations without eating.  Denies any vomiting, diarrhea, or constipation. Denies any headache or visual changes. Denies any rashes or skin changes except for dry skin. He denies abdominal pain or back pain. Denies any dysuria.  The patient is here today for  evaluation prior to starting cycle # 29    MEDICAL HISTORY: Past Medical History:  Diagnosis Date   Cancer ([facility])    Hypertension    Stroke ([facility])     ALLERGIES:  has no active allergies.  MEDICATIONS:  Current Outpatient Medications  Medication Sig Dispense Refill   atenolol (TENORMIN) 50 MG tablet Take 1 tablet (50 mg total) by mouth daily. 90 tablet 3   hydrochlorothiazide (HYDRODIURIL) 25 MG tablet TAKE 1 TABLET(25 MG) BY MOUTH EVERY MORNING 90 tablet 1   HYDROcodone-acetaminophen (NORCO) 10-325 MG tablet Take 1 tablet by mouth every 4 (four) hours as needed for moderate pain or severe pain.     levothyroxine (SYNTHROID) 150 MCG tablet Take 150 mcg by mouth daily.     metFORMIN (GLUCOPHAGE) 500 MG tablet TAKE 1 TABLET BY MOUTH DAILY 90 tablet 1   No current facility-administered medications for this visit.   Facility-Administered Medications Ordered in Other Visits  Medication Dose Route Frequency Provider Last Rate Last Admin   nivolumab (OPDIVO) 480 mg in sodium chloride 0.9 % 100 mL chemo infusion  480 mg Intravenous Once Si Gaul, MD        SURGICAL HISTORY: No past surgical history on file.  REVIEW OF SYSTEMS:   Review of Systems  Constitutional: Negative for appetite change, chills, fatigue, fever and unexpected weight change.  HENT:  Negative for mouth sores, nosebleeds, sore throat and trouble swallowing.   Eyes: Negative for eye problems and icterus.  Respiratory: Positive for chronic cough.  Negative for hemoptysis, shortness of breath and wheezing.  Cardiovascular: Negative for chest pain and leg swelling.  Gastrointestinal: Negative for abdominal pain, constipation, diarrhea, nausea and vomiting.  Genitourinary: Negative for bladder incontinence, difficulty urinating, dysuria, frequency and hematuria.   Musculoskeletal: Negative for back pain, gait problem, neck pain and neck stiffness.  Skin: Positive for dry skin. Negative for itching and rash.   Neurological: Negative for dizziness, extremity weakness, gait problem, headaches, light-headedness and seizures.  Hematological: Negative for adenopathy. Does not bruise/bleed easily.  Psychiatric/Behavioral: Negative for confusion, depression and sleep disturbance. The patient is not nervous/anxious.     PHYSICAL EXAMINATION:  Blood pressure (!) 147/93, pulse 74, temperature 98 F (36.7 C), temperature source Temporal, resp. rate 18, height 5' 7.5" (1.715 m), weight 227 lb 4.8 oz (103.1 kg), SpO2 100 %.  ECOG PERFORMANCE STATUS: 1  Physical Exam  Constitutional: Oriented to person, place, and time and well-developed, well-nourished, and in no distress.  HENT:  Head: Normocephalic and atraumatic.  Mouth/Throat: Oropharynx is clear and moist. No oropharyngeal exudate.  Eyes: Conjunctivae are normal. Right eye exhibits no discharge. Left eye exhibits no discharge. No scleral icterus.  Neck: Normal range of motion. Neck supple.  Cardiovascular: Normal rate, regular rhythm, normal heart sounds and intact distal pulses.   Pulmonary/Chest: Effort normal and breath sounds normal. No respiratory distress. No wheezes. No rales.  Abdominal: Soft. Bowel sounds are normal. Exhibits no distension and no mass. There is no tenderness.  Musculoskeletal: Normal range of motion. Exhibits no edema.  Lymphadenopathy:    No cervical adenopathy.  Neurological: Alert and oriented to person, place, and time. Exhibits normal muscle tone. Gait normal. Coordination normal.  Skin: Skin is warm and dry. No rash noted. Not diaphoretic. No erythema. No pallor.  Psychiatric: Mood, memory and judgment normal.  Vitals reviewed.  LABORATORY DATA: Lab Results  Component Value Date   WBC 8.5 12/15/2022   HGB 14.2 12/15/2022   HCT 42.5 12/15/2022   MCV 93.8 12/15/2022   PLT 286 12/15/2022      Chemistry      Component Value Date/Time   NA 139 12/15/2022 1013   K 3.8 12/15/2022 1013   CL 104 12/15/2022 1013    CO2 29 12/15/2022 1013   BUN 24 (H) 12/15/2022 1013   CREATININE 1.20 12/15/2022 1013      Component Value Date/Time   CALCIUM 9.3 12/15/2022 1013   ALKPHOS 61 12/15/2022 1013   AST 14 (L) 12/15/2022 1013   ALT 27 12/15/2022 1013   BILITOT 0.3 12/15/2022 1013       RADIOGRAPHIC STUDIES:  No results found.   ASSESSMENT/PLAN:  This is a very pleasant 52 year old male diagnosed with stage IV renal cell carcinoma with rhabdoid features.  He was diagnosed in February 2022.  He presented with pulmonary involvement as well as mediastinal lymphadenopathy.   He is status post renal biopsy on 09/14/2020 which confirmed the presence of clear-cell renal cell carcinoma with rhabdoid features.   He then started induction treatment with immunotherapy ipilimumab 1 mg/kg and nivolumab 3 mg/kg IV every 3 weeks for 4 cycles.  The first dose was given on 10/02/2020.   He is currently on maintenance treatment with nivolumab 480 mg IV every 4 weeks.  Status post 28 cycles.  Labs were reviewed.  Recommend that he continue with cycle #29 today as schedule.    We will see him back for follow-up visit in 4 weeks for evaluation repeat blood work before undergoing cycle #30  We discussed prognosis and I answered  his questions to the best of my ability.  I relayed our discussion to Dr. Arbutus Ped.  I will arrange for restaging CT scan of the chest, abdomen, pelvis prior to his next follow-up appointment.  Therefore at his next appointment, will have more information on his current condition.  If he has stable disease, we talked about continuing treatment as he has been on treatment for over 2 years versus observation.  The patient is concerned about is expected survival and prognosis as he wants to ensure his family is taking care of should his health decline.  From looking at his recent thyroid labs, it is likely that the pharmacy filled the wrong dose.  He had been taking 150 mcg of Synthroid which is an  appropriate dose for him.  Recommend that he call the pharmacy and his PCPs office who prescribes his Synthroid to send a prescription for the correct dose.  The patient was advised to call immediately if she has any concerning symptoms in the interval. The patient voices understanding of current disease status and treatment options and is in agreement with the current care plan. All questions were answered. The patient knows to call the clinic with any problems, questions or concerns. We can certainly see the patient much sooner if necessary .     Orders Placed This Encounter  Procedures   CT Chest W Contrast    Please schedule around 6/7 or 6/10. No later than 6/10    Standing Status:   Future    Standing Expiration Date:   12/15/2023    Order Specific Question:   If indicated for the ordered procedure, I authorize the administration of contrast media per Radiology protocol    Answer:   Yes    Order Specific Question:   Does the patient have a contrast media/X-ray dye allergy?    Answer:   No    Order Specific Question:   Preferred imaging location?    Answer:   [facility]   CT Abdomen Pelvis W Wo Contrast    This exam should ONLY be ordered for initial diagnosis or follow up of known pancreatic/liver/renal/bladder masses.    Standing Status:   Future    Standing Expiration Date:   12/15/2023    Scheduling Instructions:     Please schedule around 6/7 or 6/10. No later than 6/10    Order Specific Question:   If indicated for the ordered procedure, I authorize the administration of contrast media per Radiology protocol    Answer:   Yes    Order Specific Question:   Does the patient have a contrast media/X-ray dye allergy?    Answer:   No    Order Specific Question:   Preferred imaging location?    Answer:   [facility]    Order Specific Question:   If indicated for the ordered procedure, I authorize the administration of oral contrast media per Radiology protocol     Answer:   Yes     The total time spent in the appointment was 20-29 minutes  Gilbert Manolis L Roberth Berling, PA-C 12/15/22

## 2022-12-15 ENCOUNTER — Inpatient Hospital Stay: Payer: Commercial Managed Care - HMO

## 2022-12-15 ENCOUNTER — Inpatient Hospital Stay: Payer: Commercial Managed Care - HMO | Attending: Oncology

## 2022-12-15 ENCOUNTER — Inpatient Hospital Stay (HOSPITAL_BASED_OUTPATIENT_CLINIC_OR_DEPARTMENT_OTHER): Payer: Commercial Managed Care - HMO | Admitting: Physician Assistant

## 2022-12-15 VITALS — BP 147/93 | HR 74 | Temp 98.0°F | Resp 18 | Ht 67.5 in | Wt 227.3 lb

## 2022-12-15 VITALS — BP 127/82 | HR 64

## 2022-12-15 DIAGNOSIS — Z5112 Encounter for antineoplastic immunotherapy: Secondary | ICD-10-CM | POA: Diagnosis not present

## 2022-12-15 DIAGNOSIS — C649 Malignant neoplasm of unspecified kidney, except renal pelvis: Secondary | ICD-10-CM | POA: Insufficient documentation

## 2022-12-15 DIAGNOSIS — C642 Malignant neoplasm of left kidney, except renal pelvis: Secondary | ICD-10-CM

## 2022-12-15 DIAGNOSIS — Z79620 Long term (current) use of immunosuppressive biologic: Secondary | ICD-10-CM | POA: Diagnosis not present

## 2022-12-15 LAB — CBC WITH DIFFERENTIAL (CANCER CENTER ONLY)
Abs Immature Granulocytes: 0.04 10*3/uL (ref 0.00–0.07)
Basophils Absolute: 0 10*3/uL (ref 0.0–0.1)
Basophils Relative: 1 %
Eosinophils Absolute: 0.3 10*3/uL (ref 0.0–0.5)
Eosinophils Relative: 4 %
HCT: 42.5 % (ref 39.0–52.0)
Hemoglobin: 14.2 g/dL (ref 13.0–17.0)
Immature Granulocytes: 1 %
Lymphocytes Relative: 19 %
Lymphs Abs: 1.6 10*3/uL (ref 0.7–4.0)
MCH: 31.3 pg (ref 26.0–34.0)
MCHC: 33.4 g/dL (ref 30.0–36.0)
MCV: 93.8 fL (ref 80.0–100.0)
Monocytes Absolute: 0.5 10*3/uL (ref 0.1–1.0)
Monocytes Relative: 5 %
Neutro Abs: 6 10*3/uL (ref 1.7–7.7)
Neutrophils Relative %: 70 %
Platelet Count: 286 10*3/uL (ref 150–400)
RBC: 4.53 MIL/uL (ref 4.22–5.81)
RDW: 12.8 % (ref 11.5–15.5)
WBC Count: 8.5 10*3/uL (ref 4.0–10.5)
nRBC: 0 % (ref 0.0–0.2)

## 2022-12-15 LAB — CMP (CANCER CENTER ONLY)
ALT: 27 U/L (ref 0–44)
AST: 14 U/L — ABNORMAL LOW (ref 15–41)
Albumin: 4.4 g/dL (ref 3.5–5.0)
Alkaline Phosphatase: 61 U/L (ref 38–126)
Anion gap: 6 (ref 5–15)
BUN: 24 mg/dL — ABNORMAL HIGH (ref 6–20)
CO2: 29 mmol/L (ref 22–32)
Calcium: 9.3 mg/dL (ref 8.9–10.3)
Chloride: 104 mmol/L (ref 98–111)
Creatinine: 1.2 mg/dL (ref 0.61–1.24)
GFR, Estimated: >60 mL/min (ref >60)
Glucose, Bld: 159 mg/dL — ABNORMAL HIGH (ref 70–99)
Potassium: 3.8 mmol/L (ref 3.5–5.1)
Sodium: 139 mmol/L (ref 135–145)
Total Bilirubin: 0.3 mg/dL (ref 0.3–1.2)
Total Protein: 7.2 g/dL (ref 6.5–8.1)

## 2022-12-15 LAB — TSH: TSH: 18.315 u[IU]/mL — ABNORMAL HIGH (ref 0.350–4.500)

## 2022-12-15 MED ORDER — SODIUM CHLORIDE 0.9 % IV SOLN: Freq: Once | INTRAVENOUS | Status: AC

## 2022-12-15 MED ORDER — SODIUM CHLORIDE 0.9 % IV SOLN
480.0000 mg | Freq: Once | INTRAVENOUS | Status: AC
  Administered 2022-12-15: 480 mg via INTRAVENOUS
  Filled 2022-12-15: qty 48

## 2022-12-15 NOTE — Patient Instructions (Signed)
[facility]  Discharge Instructions: Thank you for choosing [facility] to provide your oncology and hematology care.   If you have a lab appointment with the Cancer Center, please go directly to the Cancer Center and check in at the registration area.   Wear comfortable clothing and clothing appropriate for easy access to any Portacath or PICC line.   We strive to give you quality time with your provider. You may need to reschedule your appointment if you arrive late (15 or more minutes).  Arriving late affects you and other patients whose appointments are after yours.  Also, if you miss three or more appointments without notifying the office, you may be dismissed from the clinic at the provider's discretion.      For prescription refill requests, have your pharmacy contact our office and allow 72 hours for refills to be completed.    Today you received the following chemotherapy and/or immunotherapy agents: Opdivo      To help prevent nausea and vomiting after your treatment, we encourage you to take your nausea medication as directed.  BELOW ARE SYMPTOMS THAT SHOULD BE REPORTED IMMEDIATELY: *FEVER GREATER THAN 100.4 F (38 C) OR HIGHER *CHILLS OR SWEATING *NAUSEA AND VOMITING THAT IS NOT CONTROLLED WITH YOUR NAUSEA MEDICATION *UNUSUAL SHORTNESS OF BREATH *UNUSUAL BRUISING OR BLEEDING *URINARY PROBLEMS (pain or burning when urinating, or frequent urination) *BOWEL PROBLEMS (unusual diarrhea, constipation, pain near the anus) TENDERNESS IN MOUTH AND THROAT WITH OR WITHOUT PRESENCE OF ULCERS (sore throat, sores in mouth, or a toothache) UNUSUAL RASH, SWELLING OR PAIN  UNUSUAL VAGINAL DISCHARGE OR ITCHING   Items with * indicate a potential emergency and should be followed up as soon as possible or go to the Emergency Department if any problems should occur.  Please show the CHEMOTHERAPY ALERT CARD or IMMUNOTHERAPY ALERT CARD at check-in  to the Emergency Department and triage nurse.  Should you have questions after your visit or need to cancel or reschedule your appointment, please contact [facility]  Dept: [phone]  and follow the prompts.  Office hours are 8:00 a.m. to 4:30 p.m. Monday - Friday. Please note that voicemails left after 4:00 p.m. may not be returned until the following business day.  We are closed weekends and major holidays. You have access to a nurse at all times for urgent questions. Please call the main number to the clinic Dept: [phone] and follow the prompts.   For any non-urgent questions, you may also contact your provider using MyChart. We now offer e-Visits for anyone 18 and older to request care online for non-urgent symptoms. For details visit [email or web address].   Also download the MyChart app! Go to the app store, search "MyChart", open the app, select [facility], and log in with your MyChart username and password.   

## 2022-12-17 LAB — T4: T4, Total: 6.9 ug/dL (ref 4.5–12.0)

## 2022-12-21 ENCOUNTER — Other Ambulatory Visit: Payer: Self-pay

## 2022-12-21 ENCOUNTER — Telehealth: Payer: Self-pay

## 2022-12-21 DIAGNOSIS — C778 Secondary and unspecified malignant neoplasm of lymph nodes of multiple regions: Secondary | ICD-10-CM

## 2022-12-21 DIAGNOSIS — C642 Malignant neoplasm of left kidney, except renal pelvis: Secondary | ICD-10-CM

## 2022-12-21 NOTE — Telephone Encounter (Signed)
This nurse received a call from [place] at [facility] stating that the order for this patients CT Abdomen Pelvis needs to be changed to With contrast so that his scan can be scheduled.  This nurse spoke with provider who gave the ok to change the order.  Changes made and [facility] made aware.  No further questions or concerns noted at this time.

## 2022-12-24 ENCOUNTER — Other Ambulatory Visit: Payer: Self-pay

## 2023-01-09 NOTE — Progress Notes (Signed)
[facility] OFFICE PROGRESS NOTE  [name], MD 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   PRIOR THERAPY: 1) Status post renal biopsy on September 14, 2020 confirming the presence of clear-cell renal cell carcinoma with rhabdoid features. 2) status post induction treatment with immunotherapy with ipilimumab 1 Mg/KG and nivolumab 3 mg/KG every 3 weeks for 4 cycles. First dose was given on October 02, 2020.  CURRENT THERAPY: Maintenance treatment with nivolumab 480 Mg IV every 4 weeks.  Status post 29 cycles   INTERVAL HISTORY: Bob Ewing 52 y.o. male returns to the clinic today for a follow-up visit.  The patient was last seen 1 months ago by myself on 12/15/2022.  The patient was feeling well at that time without any concerning complaints but he did have a lot of questions regarding prognosis and life expectancy.  He is does have a restaging CT scan but it appears this is scheduled for 01/30/2023.   He is currently undergoing immunotherapy with nivolumab every 4 weeks and he tolerates this well without any concerning adverse side effects.  He denies any fever, chills, or weight loss.  He has intermittent night sweats.  He denies any chest pain, shortness of breath, or hemoptysis.  He is a current smoker and has chronic cough.  His cough is stable..  Denies any vomiting, unusual nausea, diarrhea, or constipation.  He denies any headache or visual changes.  Denies any rashes or skin changes except for dry skin.  He denies any abdominal pain. Sometimes might have mild mid back pain  He denies any dysuria. He had some mild headache last week for which he took aspirin. He denies associated neurological symptoms associated with this such as vision changes, speech changes, dizziness, balance changes.  He is here  today for evaluation and repeat blood work before undergoing cycle #30.   MEDICAL HISTORY: Past Medical History:  Diagnosis Date   Cancer (HCC)    Hypertension    Stroke (HCC)     ALLERGIES:  has no active allergies.  MEDICATIONS:  Current Outpatient Medications  Medication Sig Dispense Refill   atenolol (TENORMIN) 50 MG tablet Take 1 tablet (50 mg total) by mouth daily. 90 tablet 3   hydrochlorothiazide (HYDRODIURIL) 25 MG tablet TAKE 1 TABLET(25 MG) BY MOUTH EVERY MORNING 90 tablet 1   HYDROcodone-acetaminophen (NORCO) 10-325 MG tablet Take 1 tablet by mouth every 4 (four) hours as needed for moderate pain or severe pain.     levothyroxine (SYNTHROID) 150 MCG tablet Take 150 mcg by mouth daily.     metFORMIN (GLUCOPHAGE) 500 MG tablet TAKE 1 TABLET BY MOUTH DAILY 90 tablet 1   No current facility-administered medications for this visit.    SURGICAL HISTORY: No past surgical history on file.  REVIEW OF SYSTEMS:   Review of Systems  Constitutional: Negative for appetite change, chills, fatigue, fever and unexpected weight change.  HENT:  Negative for mouth sores, nosebleeds, sore throat and trouble swallowing.   Eyes: Negative for eye problems and icterus.  Respiratory: Positive for chronic cough.  Negative for hemoptysis, shortness of breath and wheezing.   Cardiovascular: Negative for chest pain and leg swelling.  Gastrointestinal: Negative for abdominal pain, constipation, diarrhea, nausea and vomiting.  Genitourinary: Negative for bladder incontinence, difficulty urinating, dysuria, frequency and hematuria.   Musculoskeletal: Positive for intermittent  back pain. Negative for gait problem, neck pain and neck stiffness.  Skin: Positive for dry skin. Negative for itching and rash.  Neurological: Positive for mild headaches. Negative for dizziness, extremity weakness, gait problem, light-headedness and seizures.  Hematological: Negative for adenopathy. Does not bruise/bleed  easily.  Psychiatric/Behavioral: Negative for confusion, depression and sleep disturbance. The patient is not nervous/anxious    PHYSICAL EXAMINATION:  Blood pressure 131/89, pulse 69, temperature 98.3 F (36.8 C), temperature source Oral, resp. rate 18, height 5\' 7"  (1.702 m), weight 228 lb 3.2 oz (103.5 kg), SpO2 100 %.  ECOG PERFORMANCE STATUS: 1  Physical Exam  Constitutional: Oriented to person, place, and time and well-developed, well-nourished, and in no distress. Marland Kitchen  HENT:  Head: Normocephalic and atraumatic.  Mouth/Throat: Oropharynx is clear and moist. No oropharyngeal exudate.  Eyes: Conjunctivae are normal. Right eye exhibits no discharge. Left eye exhibits no discharge. No scleral icterus.  Neck: Normal range of motion. Neck supple.  Cardiovascular: Normal rate, regular rhythm, normal heart sounds and intact distal pulses.   Pulmonary/Chest: Effort normal and breath sounds normal. No respiratory distress. No wheezes. No rales.  Abdominal: Soft. Bowel sounds are normal. Exhibits no distension and no mass. There is no tenderness.  Musculoskeletal: Normal range of motion. Exhibits no edema.  Lymphadenopathy:    No cervical adenopathy.  Neurological: Alert and oriented to person, place, and time. Exhibits normal muscle tone. Gait normal. Coordination normal.  Skin: Skin is warm and dry. No rash noted. Not diaphoretic. No erythema. No pallor.  Psychiatric: Mood, memory and judgment normal.  Vitals reviewed.  LABORATORY DATA: Lab Results  Component Value Date   WBC 9.1 01/12/2023   HGB 14.2 01/12/2023   HCT 41.3 01/12/2023   MCV 92.8 01/12/2023   PLT 270 01/12/2023      Chemistry      Component Value Date/Time   NA 136 01/12/2023 0846   K 3.8 01/12/2023 0846   CL 103 01/12/2023 0846   CO2 27 01/12/2023 0846   BUN 22 (H) 01/12/2023 0846   CREATININE 1.14 01/12/2023 0846      Component Value Date/Time   CALCIUM 9.8 01/12/2023 0846   ALKPHOS 69 01/12/2023 0846    AST 18 01/12/2023 0846   ALT 41 01/12/2023 0846   BILITOT 0.4 01/12/2023 0846       RADIOGRAPHIC STUDIES:  No results found.   ASSESSMENT/PLAN:  This is a very pleasant 52 year old male diagnosed with stage IV renal cell carcinoma with rhabdoid features.  He was diagnosed in February 2022.  He presented with pulmonary involvement as well as mediastinal lymphadenopathy.   He is status post renal biopsy on 09/14/2020 which confirmed the presence of clear-cell renal cell carcinoma with rhabdoid features.   He then started induction treatment with immunotherapy ipilimumab 1 mg/kg and nivolumab 3 mg/kg IV every 3 weeks for 4 cycles.  The first dose was given on 10/02/2020.   He is currently on maintenance treatment with nivolumab 480 mg IV every 4 weeks.  Status post 29 cycles.   The patient was seen with Dr. Arbutus Ped. The patient had a lot of questions about survival. Dr. Arbutus Ped discussed average survival with his condition. Average survival is 4 years. Dr. Arbutus Ped explained that treatment is for disease control, not curative. Labs were reviewed.  Recommend that he proceed cycle 30 today scheduled.  We will call [facility] radiology to link the CT scan of the chest with the CT abdomen pelvis which is scheduled for 01/30/2023.  We will see him back for follow-up visit in 4 weeks for evaluation to review his scan results and discuss next steps  At his next appointment, if no evidence of disease progression, we may put him on observation with close monitoring. If/when he has disease progression in the future, we can resume the same or discuss alternative therapies.   The patient was advised to call immediately if she has any concerning symptoms in the interval. The patient voices understanding of current disease status and treatment options and is in agreement with the current care plan. All questions were answered. The patient knows to call the clinic with any problems, questions or concerns. We  can certainly see the patient much sooner if necessary  No orders of the defined types were placed in this encounter.   Landy Mace L Markis Langland, PA-C 01/12/23  ADDENDUM: Hematology/Oncology Attending: I had a face-to-face encounter with the patient today.  I reviewed his record, lab and recommended his care plan.  This is a very pleasant 52 years old white male with stage IV clear-cell renal cell carcinoma with rhabdoid features diagnosed in February 2022 presented with pulmonary involvement as well as mediastinal lymphadenopathy. The patient has been on treatment with immunotherapy initially with induction ipilimumab and nivolumab for 4 cycles started October 02, 2020.  He has been on maintenance treatment with nivolumab 480 Mg IV every 4 weeks status post 29 cycles. 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 He has a lot of questions today about his prognosis and if the treatment is curative for palliative. I had a lengthy discussion with the patient today about his condition.  I explained to him that unfortunately there is no cure for stage IV renal cell carcinoma at this point and the goal of this treatment is palliative with control of his symptoms and extension of his life expectancy.  I explained to the patient that the median overall survival for treatment with immunotherapy for patient with a stage IV renal cell carcinoma is currently around 4 years but there are a lot of people that may do better or less on this number. He appreciated the candid opinion about his survival and treatment options. He will proceed with treatment today as planned. He will come back for follow-up visit in around 4 weeks for evaluation with repeat CT scan of the chest, abdomen and pelvis for restaging of his disease. The patient was advised to call immediately if he has any other concerning symptoms in  the interval. The total time spent in the appointment was 30 minutes. Disclaimer: This note was dictated with voice recognition software. Similar sounding words can inadvertently be transcribed and may be missed upon review. Lajuana Matte, MD

## 2023-01-12 ENCOUNTER — Inpatient Hospital Stay: Payer: Commercial Managed Care - HMO

## 2023-01-12 ENCOUNTER — Inpatient Hospital Stay: Payer: Commercial Managed Care - HMO | Attending: Oncology

## 2023-01-12 ENCOUNTER — Inpatient Hospital Stay: Payer: Commercial Managed Care - HMO | Admitting: Physician Assistant

## 2023-01-12 ENCOUNTER — Other Ambulatory Visit: Payer: Self-pay

## 2023-01-12 VITALS — BP 131/89 | HR 69 | Temp 98.3°F | Resp 18 | Ht 67.0 in | Wt 228.2 lb

## 2023-01-12 VITALS — BP 151/84 | HR 65 | Resp 18

## 2023-01-12 DIAGNOSIS — C642 Malignant neoplasm of left kidney, except renal pelvis: Secondary | ICD-10-CM

## 2023-01-12 DIAGNOSIS — F17210 Nicotine dependence, cigarettes, uncomplicated: Secondary | ICD-10-CM | POA: Insufficient documentation

## 2023-01-12 DIAGNOSIS — Z5112 Encounter for antineoplastic immunotherapy: Secondary | ICD-10-CM

## 2023-01-12 DIAGNOSIS — Z79620 Long term (current) use of immunosuppressive biologic: Secondary | ICD-10-CM | POA: Insufficient documentation

## 2023-01-12 DIAGNOSIS — C649 Malignant neoplasm of unspecified kidney, except renal pelvis: Secondary | ICD-10-CM | POA: Insufficient documentation

## 2023-01-12 LAB — CBC WITH DIFFERENTIAL (CANCER CENTER ONLY)
Abs Immature Granulocytes: 0.05 10*3/uL (ref 0.00–0.07)
Basophils Absolute: 0.1 10*3/uL (ref 0.0–0.1)
Basophils Relative: 1 %
Eosinophils Absolute: 0.4 10*3/uL (ref 0.0–0.5)
Eosinophils Relative: 4 %
HCT: 41.3 % (ref 39.0–52.0)
Hemoglobin: 14.2 g/dL (ref 13.0–17.0)
Immature Granulocytes: 1 %
Lymphocytes Relative: 17 %
Lymphs Abs: 1.6 10*3/uL (ref 0.7–4.0)
MCH: 31.9 pg (ref 26.0–34.0)
MCHC: 34.4 g/dL (ref 30.0–36.0)
MCV: 92.8 fL (ref 80.0–100.0)
Monocytes Absolute: 0.5 10*3/uL (ref 0.1–1.0)
Monocytes Relative: 6 %
Neutro Abs: 6.5 10*3/uL (ref 1.7–7.7)
Neutrophils Relative %: 71 %
Platelet Count: 270 10*3/uL (ref 150–400)
RBC: 4.45 MIL/uL (ref 4.22–5.81)
RDW: 13 % (ref 11.5–15.5)
WBC Count: 9.1 10*3/uL (ref 4.0–10.5)
nRBC: 0 % (ref 0.0–0.2)

## 2023-01-12 LAB — CMP (CANCER CENTER ONLY)
ALT: 41 U/L (ref 0–44)
AST: 18 U/L (ref 15–41)
Albumin: 3.9 g/dL (ref 3.5–5.0)
Alkaline Phosphatase: 69 U/L (ref 38–126)
Anion gap: 6 (ref 5–15)
BUN: 22 mg/dL — ABNORMAL HIGH (ref 6–20)
CO2: 27 mmol/L (ref 22–32)
Calcium: 9.8 mg/dL (ref 8.9–10.3)
Chloride: 103 mmol/L (ref 98–111)
Creatinine: 1.14 mg/dL (ref 0.61–1.24)
GFR, Estimated: >60 mL/min (ref >60)
Glucose, Bld: 202 mg/dL — ABNORMAL HIGH (ref 70–99)
Potassium: 3.8 mmol/L (ref 3.5–5.1)
Sodium: 136 mmol/L (ref 135–145)
Total Bilirubin: 0.4 mg/dL (ref 0.3–1.2)
Total Protein: 7.3 g/dL (ref 6.5–8.1)

## 2023-01-12 LAB — TSH: TSH: 25.599 u[IU]/mL — ABNORMAL HIGH (ref 0.350–4.500)

## 2023-01-12 MED ORDER — SODIUM CHLORIDE 0.9 % IV SOLN
480.0000 mg | Freq: Once | INTRAVENOUS | Status: AC
  Administered 2023-01-12: 480 mg via INTRAVENOUS
  Filled 2023-01-12: qty 48

## 2023-01-12 MED ORDER — SODIUM CHLORIDE 0.9 % IV SOLN: Freq: Once | INTRAVENOUS | Status: AC

## 2023-01-12 NOTE — Patient Instructions (Signed)
[facility]  Discharge Instructions: Thank you for choosing [facility] to provide your oncology and hematology care.   If you have a lab appointment with the Cancer Center, please go directly to the Cancer Center and check in at the registration area.   Wear comfortable clothing and clothing appropriate for easy access to any Portacath or PICC line.   We strive to give you quality time with your provider. You may need to reschedule your appointment if you arrive late (15 or more minutes).  Arriving late affects you and other patients whose appointments are after yours.  Also, if you miss three or more appointments without notifying the office, you may be dismissed from the clinic at the provider's discretion.      For prescription refill requests, have your pharmacy contact our office and allow 72 hours for refills to be completed.    Today you received the following chemotherapy and/or immunotherapy agents: Opdivo      To help prevent nausea and vomiting after your treatment, we encourage you to take your nausea medication as directed.  BELOW ARE SYMPTOMS THAT SHOULD BE REPORTED IMMEDIATELY: *FEVER GREATER THAN 100.4 F (38 C) OR HIGHER *CHILLS OR SWEATING *NAUSEA AND VOMITING THAT IS NOT CONTROLLED WITH YOUR NAUSEA MEDICATION *UNUSUAL SHORTNESS OF BREATH *UNUSUAL BRUISING OR BLEEDING *URINARY PROBLEMS (pain or burning when urinating, or frequent urination) *BOWEL PROBLEMS (unusual diarrhea, constipation, pain near the anus) TENDERNESS IN MOUTH AND THROAT WITH OR WITHOUT PRESENCE OF ULCERS (sore throat, sores in mouth, or a toothache) UNUSUAL RASH, SWELLING OR PAIN  UNUSUAL VAGINAL DISCHARGE OR ITCHING   Items with * indicate a potential emergency and should be followed up as soon as possible or go to the Emergency Department if any problems should occur.  Please show the CHEMOTHERAPY ALERT CARD or IMMUNOTHERAPY ALERT CARD at check-in  to the Emergency Department and triage nurse.  Should you have questions after your visit or need to cancel or reschedule your appointment, please contact [facility]  Dept: [phone]  and follow the prompts.  Office hours are 8:00 a.m. to 4:30 p.m. Monday - Friday. Please note that voicemails left after 4:00 p.m. may not be returned until the following business day.  We are closed weekends and major holidays. You have access to a nurse at all times for urgent questions. Please call the main number to the clinic Dept: [phone] and follow the prompts.   For any non-urgent questions, you may also contact your provider using MyChart. We now offer e-Visits for anyone 18 and older to request care online for non-urgent symptoms. For details visit [email or web address].   Also download the MyChart app! Go to the app store, search "MyChart", open the app, select , and log in with your MyChart username and password.   

## 2023-01-13 ENCOUNTER — Encounter: Payer: Self-pay | Admitting: Internal Medicine

## 2023-01-14 ENCOUNTER — Other Ambulatory Visit: Payer: Self-pay

## 2023-01-14 LAB — T4: T4, Total: 6.4 ug/dL (ref 4.5–12.0)

## 2023-01-16 ENCOUNTER — Telehealth: Payer: Self-pay | Admitting: *Deleted

## 2023-01-16 MED ORDER — LEVOTHYROXINE SODIUM 150 MCG PO TABS: 150.0000 ug | ORAL_TABLET | Freq: Every day | ORAL | 1 refills | Status: DC

## 2023-01-16 NOTE — Telephone Encounter (Signed)
Telephone call to patient to confirm levothyroxine dose. Patient reports he was taking daily. He started taking last week. He does not have any more tablets and needs a refill. Refill request received and notified Cassie, PA. Refill sent to [facility].

## 2023-01-30 ENCOUNTER — Encounter: Payer: Self-pay | Admitting: Physician Assistant

## 2023-01-30 ENCOUNTER — Other Ambulatory Visit: Payer: Self-pay | Admitting: Physician Assistant

## 2023-01-30 ENCOUNTER — Ambulatory Visit
Admission: RE | Admit: 2023-01-30 | Discharge: 2023-01-30 | Disposition: A | Discharge status: Home / Self Care | Payer: Commercial Managed Care - HMO | Source: Ambulatory Visit | Attending: Physician Assistant | Admitting: Physician Assistant

## 2023-01-30 DIAGNOSIS — C778 Secondary and unspecified malignant neoplasm of lymph nodes of multiple regions: Secondary | ICD-10-CM

## 2023-01-30 DIAGNOSIS — C642 Malignant neoplasm of left kidney, except renal pelvis: Secondary | ICD-10-CM

## 2023-01-30 MED ORDER — IOPAMIDOL (ISOVUE-300) INJECTION 61%
100.0000 mL | Freq: Once | INTRAVENOUS | Status: AC | PRN
  Administered 2023-01-30: 100 mL via INTRAVENOUS

## 2023-01-31 ENCOUNTER — Telehealth: Payer: Self-pay | Admitting: Internal Medicine

## 2023-01-31 NOTE — Telephone Encounter (Signed)
Called patient regarding July appointments, patient is notified.  

## 2023-02-06 ENCOUNTER — Other Ambulatory Visit: Payer: Self-pay | Admitting: Internal Medicine

## 2023-02-06 DIAGNOSIS — C642 Malignant neoplasm of left kidney, except renal pelvis: Secondary | ICD-10-CM

## 2023-02-09 ENCOUNTER — Encounter: Payer: Self-pay | Admitting: Medical Oncology

## 2023-02-09 ENCOUNTER — Inpatient Hospital Stay: Payer: Commercial Managed Care - HMO | Attending: Oncology

## 2023-02-09 ENCOUNTER — Inpatient Hospital Stay (HOSPITAL_BASED_OUTPATIENT_CLINIC_OR_DEPARTMENT_OTHER): Payer: Commercial Managed Care - HMO | Admitting: Internal Medicine

## 2023-02-09 ENCOUNTER — Other Ambulatory Visit: Payer: Self-pay

## 2023-02-09 VITALS — BP 124/77 | HR 66 | Temp 98.0°F | Resp 17 | Ht 67.0 in | Wt 223.8 lb

## 2023-02-09 DIAGNOSIS — Z5986 Financial insecurity: Secondary | ICD-10-CM | POA: Insufficient documentation

## 2023-02-09 DIAGNOSIS — C649 Malignant neoplasm of unspecified kidney, except renal pelvis: Secondary | ICD-10-CM | POA: Diagnosis present

## 2023-02-09 DIAGNOSIS — R5383 Other fatigue: Secondary | ICD-10-CM | POA: Insufficient documentation

## 2023-02-09 DIAGNOSIS — Z79899 Other long term (current) drug therapy: Secondary | ICD-10-CM | POA: Diagnosis not present

## 2023-02-09 DIAGNOSIS — C642 Malignant neoplasm of left kidney, except renal pelvis: Secondary | ICD-10-CM

## 2023-02-09 LAB — CMP (CANCER CENTER ONLY)
ALT: 31 U/L (ref 0–44)
AST: 14 U/L — ABNORMAL LOW (ref 15–41)
Albumin: 4 g/dL (ref 3.5–5.0)
Alkaline Phosphatase: 65 U/L (ref 38–126)
Anion gap: 8 (ref 5–15)
BUN: 25 mg/dL — ABNORMAL HIGH (ref 6–20)
CO2: 28 mmol/L (ref 22–32)
Calcium: 9.5 mg/dL (ref 8.9–10.3)
Chloride: 103 mmol/L (ref 98–111)
Creatinine: 1.4 mg/dL — ABNORMAL HIGH (ref 0.61–1.24)
GFR, Estimated: >60 mL/min (ref >60)
Glucose, Bld: 139 mg/dL — ABNORMAL HIGH (ref 70–99)
Potassium: 3.9 mmol/L (ref 3.5–5.1)
Sodium: 139 mmol/L (ref 135–145)
Total Bilirubin: 0.3 mg/dL (ref 0.3–1.2)
Total Protein: 7.2 g/dL (ref 6.5–8.1)

## 2023-02-09 LAB — CBC WITH DIFFERENTIAL (CANCER CENTER ONLY)
Abs Immature Granulocytes: 0.05 10*3/uL (ref 0.00–0.07)
Basophils Absolute: 0.1 10*3/uL (ref 0.0–0.1)
Basophils Relative: 1 %
Eosinophils Absolute: 0.3 10*3/uL (ref 0.0–0.5)
Eosinophils Relative: 3 %
HCT: 42.3 % (ref 39.0–52.0)
Hemoglobin: 14.3 g/dL (ref 13.0–17.0)
Immature Granulocytes: 1 %
Lymphocytes Relative: 16 %
Lymphs Abs: 1.3 10*3/uL (ref 0.7–4.0)
MCH: 31.5 pg (ref 26.0–34.0)
MCHC: 33.8 g/dL (ref 30.0–36.0)
MCV: 93.2 fL (ref 80.0–100.0)
Monocytes Absolute: 0.6 10*3/uL (ref 0.1–1.0)
Monocytes Relative: 7 %
Neutro Abs: 6.3 10*3/uL (ref 1.7–7.7)
Neutrophils Relative %: 72 %
Platelet Count: 277 10*3/uL (ref 150–400)
RBC: 4.54 MIL/uL (ref 4.22–5.81)
RDW: 12.5 % (ref 11.5–15.5)
WBC Count: 8.6 10*3/uL (ref 4.0–10.5)
nRBC: 0 % (ref 0.0–0.2)

## 2023-02-09 LAB — TSH: TSH: 16.05 u[IU]/mL — ABNORMAL HIGH (ref 0.350–4.500)

## 2023-02-09 NOTE — Progress Notes (Signed)
[facility] [phone]   [phone]  OFFICE PROGRESS NOTE  [name], MD 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  PRIOR THERAPY:  1) Status post renal biopsy on September 14, 2020 confirming the presence of clear-cell renal cell carcinoma with rhabdoid features. 2) status post induction treatment with immunotherapy with ipilimumab 1 Mg/KG and nivolumab 3 mg/KG every 3 weeks for 4 cycles.  First dose was given on October 02, 2020.  CURRENT THERAPY: Maintenance treatment with nivolumab 480 Mg IV every 4 weeks.  Status post 30 cycles.  INTERVAL HISTORY: Bob Ewing 52 y.o. male returns to the clinic today for follow-up visit.  The patient is feeling fine except for the fatigue and a lot of financial issues.  He is planning to apply for bankruptcy and would like a letter with his condition.  He completed 2 years of treatment with immunotherapy.  He denied having any current chest pain, shortness of breath, cough or hemoptysis.  He has no nausea, vomiting, diarrhea or constipation.  He has no headache or visual changes.  He is here today for evaluation with repeat CT scan of the chest, abdomen and pelvis for restaging of his disease.  MEDICAL HISTORY: Past Medical History:  Diagnosis Date   Cancer (HCC)    Hypertension    Stroke (HCC)     ALLERGIES:  has no active allergies.  MEDICATIONS:  Current Outpatient Medications  Medication Sig Dispense Refill   atenolol (TENORMIN) 50 MG tablet Take 1 tablet (50 mg total) by mouth daily. 90 tablet 3   hydrochlorothiazide (HYDRODIURIL) 25 MG tablet TAKE 1 TABLET(25 MG) BY MOUTH EVERY MORNING 90 tablet 1   HYDROcodone-acetaminophen (NORCO) 10-325 MG tablet Take 1 tablet by mouth every 4 (four) hours as needed for moderate  pain or severe pain.     levothyroxine (SYNTHROID) 150 MCG tablet Take 1 tablet (150 mcg total) by mouth daily. 30 tablet 1   metFORMIN (GLUCOPHAGE) 500 MG tablet TAKE 1 TABLET BY MOUTH DAILY 90 tablet 1   No current facility-administered medications for this visit.    SURGICAL HISTORY: No past surgical history on file.  REVIEW OF SYSTEMS:  Constitutional: positive for fatigue Eyes: negative Ears, nose, mouth, throat, and face: negative Respiratory: negative Cardiovascular: negative Gastrointestinal: negative Genitourinary:negative Integument/breast: negative Hematologic/lymphatic: negative Musculoskeletal:negative Neurological: negative Behavioral/Psych: negative Endocrine: negative Allergic/Immunologic: negative   PHYSICAL EXAMINATION: General appearance: alert, cooperative, fatigued, and no distress Head: Normocephalic, without obvious abnormality, atraumatic Neck: no adenopathy, no JVD, supple, symmetrical, trachea midline, and thyroid not enlarged, symmetric, no tenderness/mass/nodules Lymph nodes: Cervical, supraclavicular, and axillary nodes normal. Resp: clear to auscultation bilaterally Back: symmetric, no curvature. ROM normal. No CVA tenderness. Cardio: regular rate and rhythm, S1, S2 normal, no murmur, click, rub or gallop GI: soft, non-tender; bowel sounds normal; no masses,  no organomegaly Extremities: extremities normal, atraumatic, no cyanosis or edema Neurologic: Alert and oriented X 3, normal strength and tone. Normal symmetric reflexes. Normal coordination and gait  ECOG PERFORMANCE STATUS: 1 - Symptomatic but completely ambulatory  Blood pressure 124/77, pulse 66, temperature 98 F (36.7 C), temperature source Oral, resp. rate 17, height 5\' 7"  (1.702 m), weight 223 lb 12.8 oz (101.5 kg), SpO2 100%.  LABORATORY DATA: Lab Results  Component Value Date   WBC 8.6 02/09/2023  HGB 14.3 02/09/2023   HCT 42.3 02/09/2023   MCV 93.2 02/09/2023   PLT 277  02/09/2023      Chemistry      Component Value Date/Time   NA 136 01/12/2023 0846   K 3.8 01/12/2023 0846   CL 103 01/12/2023 0846   CO2 27 01/12/2023 0846   BUN 22 (H) 01/12/2023 0846   CREATININE 1.14 01/12/2023 0846      Component Value Date/Time   CALCIUM 9.8 01/12/2023 0846   ALKPHOS 69 01/12/2023 0846   AST 18 01/12/2023 0846   ALT 41 01/12/2023 0846   BILITOT 0.4 01/12/2023 0846       RADIOGRAPHIC STUDIES: CT CHEST ABDOMEN PELVIS W CONTRAST  Result Date: 01/31/2023 CLINICAL DATA:  Renal cell carcinoma recurrence. On immunotherapy. * Tracking Code: BO * EXAM: CT CHEST, ABDOMEN, AND PELVIS WITH CONTRAST TECHNIQUE: Multidetector CT imaging of the chest, abdomen and pelvis was performed following the standard protocol during bolus administration of intravenous contrast. RADIATION DOSE REDUCTION: This exam was performed according to the departmental dose-optimization program which includes automated exposure control, adjustment of the mA and/or kV according to patient size and/or use of iterative reconstruction technique. CONTRAST:  ISOVUE-300 IOPAMIDOL (ISOVUE-300) INJECTION 61% COMPARISON:  CT 06/30/2022 FINDINGS: CT CHEST FINDINGS Cardiovascular: No significant vascular findings. Normal heart size. No pericardial effusion. Mediastinum/Nodes: No axillary or supraclavicular adenopathy. No mediastinal or hilar adenopathy. No pericardial fluid. Esophagus normal. Lungs/Pleura: No suspicious pulmonary nodules. Normal pleural. Airways normal. Musculoskeletal: Expansile lesion the posterior RIGHT ninth through (112/series 6) unchanged. CT ABDOMEN AND PELVIS FINDINGS Hepatobiliary: No focal hepatic lesion. No biliary ductal dilatation. Gallbladder is normal. Common bile duct is normal. Pancreas: Pancreas is normal. No ductal dilatation. No pancreatic inflammation. Spleen: Normal spleen Adrenals/urinary tract: Adrenal glands normal. Enhancing mass extending from lower pole of the LEFT  kidney measures 5.6 x 5.4 cm compared to 5.6 by 5.5 cm for no interval change. Small LEFT periaortic lymph nodes adjacent to the mass are similar at 8 mm (image 68/2). No new adenopathy. No new new renal lesion. Ureters bladder normal Stomach/Bowel: Stomach, small bowel, appendix, and cecum are normal. The colon and rectosigmoid colon are normal. Vascular/Lymphatic: Abdominal aorta normal caliber. Small LEFT periaortic lymph nodes unchanged in size. No iliac adenopathy. Reproductive: Prostate unremarkable Other: No free fluid. Musculoskeletal: No aggressive osseous lesion. IMPRESSION: CHEST: 1. No evidence of disease progression in thorax 2. Stable expansile lesion posterior RIGHT ninth rib. PELVIS: 1. No change in size of large enhancing mass lower pole LEFT kidney. 2. No change in small LEFT periaortic lymph nodes. 3. No new evidence of metastatic disease in the abdomen pelvis. Electronically Signed   By: Genevive Bi M.D.   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  The patient started induction treatment with immunotherapy with ipilimumab 1 Mg/KG and nivolumab 3 mg/KG every 3 weeks for 4 cycles.  First dose was given on October 02, 2020.  He is currently undergoing maintenance treatment with nivolumab 480 Mg IV every 4 weeks.  Status post 30 cycles. The patient tolerated his previous treatment with immunotherapy fairly well except for the baseline fatigue.  He  completed 2 years of this treatment. He had repeat CT scan of the chest, abdomen and pelvis performed recently.  I personally and independently reviewed the scan with the patient.  I  recommended for him to continue on observation since he has no evidence for disease progression after 2 years of treatment with immunotherapy. Will continue to monitor him closely with repeat CT scan of the chest, abdomen and pelvis in 3 months and we may expand the duration of observation in the future if he has no disease recurrence. I will give the patient a letter with his condition today since he is planning to apply for bankruptcy. The patient was advised to call immediately if he has any other concerning symptoms in the interval. The patient voices understanding of current disease status and treatment options and is in agreement with the current care plan.  All questions were answered. The patient knows to call the clinic with any problems, questions or concerns. We can certainly see the patient much sooner if necessary. The total time spent in the appointment was 30 minutes.  Disclaimer: This note was dictated with voice recognition software. Similar sounding words can inadvertently be transcribed and may not be corrected upon review.

## 2023-02-10 LAB — T4: T4, Total: 5.9 ug/dL (ref 4.5–12.0)

## 2023-03-16 ENCOUNTER — Telehealth: Payer: Self-pay | Admitting: Medical Oncology

## 2023-03-16 NOTE — Telephone Encounter (Signed)
Hydrochlorothiazide refill requested. - I told pt to contact PCP for refill.

## 2023-04-26 ENCOUNTER — Telehealth: Payer: Self-pay | Admitting: Medical Oncology

## 2023-04-26 NOTE — Telephone Encounter (Signed)
Requests to change lab appt to line up with CT scan appt.Marland Kitchen

## 2023-05-05 ENCOUNTER — Inpatient Hospital Stay: Payer: Managed Care, Other (non HMO)

## 2023-05-05 ENCOUNTER — Ambulatory Visit (HOSPITAL_COMMUNITY): Payer: Commercial Managed Care - HMO

## 2023-05-09 ENCOUNTER — Ambulatory Visit: Payer: Commercial Managed Care - HMO | Admitting: Internal Medicine

## 2023-05-11 ENCOUNTER — Ambulatory Visit (HOSPITAL_COMMUNITY)
Admission: RE | Admit: 2023-05-11 | Discharge: 2023-05-11 | Disposition: A | Discharge status: Home / Self Care | Payer: Commercial Managed Care - HMO | Source: Ambulatory Visit | Attending: Internal Medicine | Admitting: Internal Medicine

## 2023-05-11 ENCOUNTER — Inpatient Hospital Stay: Payer: Managed Care, Other (non HMO) | Attending: Oncology

## 2023-05-11 DIAGNOSIS — C642 Malignant neoplasm of left kidney, except renal pelvis: Secondary | ICD-10-CM | POA: Insufficient documentation

## 2023-05-11 DIAGNOSIS — R197 Diarrhea, unspecified: Secondary | ICD-10-CM | POA: Insufficient documentation

## 2023-05-11 DIAGNOSIS — F17200 Nicotine dependence, unspecified, uncomplicated: Secondary | ICD-10-CM | POA: Insufficient documentation

## 2023-05-11 DIAGNOSIS — I10 Essential (primary) hypertension: Secondary | ICD-10-CM | POA: Insufficient documentation

## 2023-05-11 DIAGNOSIS — K5900 Constipation, unspecified: Secondary | ICD-10-CM | POA: Insufficient documentation

## 2023-05-11 LAB — CBC WITH DIFFERENTIAL (CANCER CENTER ONLY)
Abs Immature Granulocytes: 0.04 10*3/uL (ref 0.00–0.07)
Basophils Absolute: 0.1 10*3/uL (ref 0.0–0.1)
Basophils Relative: 1 %
Eosinophils Absolute: 0.3 10*3/uL (ref 0.0–0.5)
Eosinophils Relative: 4 %
HCT: 41.3 % (ref 39.0–52.0)
Hemoglobin: 13.9 g/dL (ref 13.0–17.0)
Immature Granulocytes: 1 %
Lymphocytes Relative: 22 %
Lymphs Abs: 1.4 10*3/uL (ref 0.7–4.0)
MCH: 31.8 pg (ref 26.0–34.0)
MCHC: 33.7 g/dL (ref 30.0–36.0)
MCV: 94.5 fL (ref 80.0–100.0)
Monocytes Absolute: 0.5 10*3/uL (ref 0.1–1.0)
Monocytes Relative: 8 %
Neutro Abs: 4.2 10*3/uL (ref 1.7–7.7)
Neutrophils Relative %: 64 %
Platelet Count: 245 10*3/uL (ref 150–400)
RBC: 4.37 MIL/uL (ref 4.22–5.81)
RDW: 12.8 % (ref 11.5–15.5)
WBC Count: 6.5 10*3/uL (ref 4.0–10.5)
nRBC: 0 % (ref 0.0–0.2)

## 2023-05-11 LAB — CMP (CANCER CENTER ONLY)
ALT: 49 U/L — ABNORMAL HIGH (ref 0–44)
AST: 27 U/L (ref 15–41)
Albumin: 4.3 g/dL (ref 3.5–5.0)
Alkaline Phosphatase: 70 U/L (ref 38–126)
Anion gap: 5 (ref 5–15)
BUN: 18 mg/dL (ref 6–20)
CO2: 31 mmol/L (ref 22–32)
Calcium: 9.5 mg/dL (ref 8.9–10.3)
Chloride: 104 mmol/L (ref 98–111)
Creatinine: 1.28 mg/dL — ABNORMAL HIGH (ref 0.61–1.24)
GFR, Estimated: >60 mL/min (ref >60)
Glucose, Bld: 121 mg/dL — ABNORMAL HIGH (ref 70–99)
Potassium: 3.6 mmol/L (ref 3.5–5.1)
Sodium: 140 mmol/L (ref 135–145)
Total Bilirubin: 0.3 mg/dL (ref 0.3–1.2)
Total Protein: 7.4 g/dL (ref 6.5–8.1)

## 2023-05-11 LAB — LACTATE DEHYDROGENASE: LDH: 163 U/L (ref 98–192)

## 2023-05-15 ENCOUNTER — Inpatient Hospital Stay: Payer: Managed Care, Other (non HMO) | Admitting: Internal Medicine

## 2023-05-15 VITALS — BP 154/91 | HR 64 | Temp 98.2°F | Resp 16 | Ht 67.0 in | Wt 228.3 lb

## 2023-05-15 DIAGNOSIS — I10 Essential (primary) hypertension: Secondary | ICD-10-CM | POA: Diagnosis not present

## 2023-05-15 DIAGNOSIS — C642 Malignant neoplasm of left kidney, except renal pelvis: Secondary | ICD-10-CM | POA: Diagnosis not present

## 2023-05-15 DIAGNOSIS — R197 Diarrhea, unspecified: Secondary | ICD-10-CM | POA: Diagnosis not present

## 2023-05-15 DIAGNOSIS — K5900 Constipation, unspecified: Secondary | ICD-10-CM | POA: Diagnosis not present

## 2023-05-15 DIAGNOSIS — F17200 Nicotine dependence, unspecified, uncomplicated: Secondary | ICD-10-CM | POA: Diagnosis not present

## 2023-05-15 NOTE — Progress Notes (Signed)
[facility] Telephone:([phone]   Fax:([phone]  OFFICE PROGRESS NOTE  [name], MD 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  PRIOR THERAPY:  1) Status post renal biopsy on September 14, 2020 confirming the presence of clear-cell renal cell carcinoma with rhabdoid features. 2) status post induction treatment with immunotherapy with ipilimumab 1 Mg/KG and nivolumab 3 mg/KG every 3 weeks for 4 cycles.  First dose was given on October 02, 2020. 3) Maintenance treatment with nivolumab 480 Mg IV every 4 weeks.  Status post 30 cycles.  CURRENT THERAPY: Observation  INTERVAL HISTORY: Bob Ewing 52 y.o. male returns to the clinic today for 64-month follow-up visit.Discussed the use of AI scribe software for clinical note transcription with the patient, who gave verbal consent to proceed.  History of Present Illness   Bob Ewing, a 52 year old patient with a history of stage 4 left renal cell carcinoma, was diagnosed in February 2022. The patient underwent treatment with ipilimumab and nivolumab, continuing with nivolumab for approximately 30 cycles until June 2024. The treatment was halted due to satisfactory progress and a treatment duration of over two years.  Since the last consultation in July 2024, the patient reports overall well-being with occasional pain in the area of the previous tumor. The patient denies experiencing dizziness or weakness and maintains a normal lifestyle. There are no reports of chest pain, shortness of breath, cough, or hemoptysis. However, the patient admits to being a smoker and struggles with quitting.  The patient denies experiencing nausea, vomiting, or diarrhea. There has been a weight gain of approximately four pounds since the last visit. The  patient reports occasional changes in bowel movements, alternating between constipation and diarrhea, but denies symptoms of irritable bowel syndrome such as gas or bloating.  The patient's scans continue to show positive results with no signs of cancer growth. There is a presence of ground glass opacity in the lungs, potentially indicative of inflammation, but no accompanying symptoms such as fever or chills. The patient was not on any treatment at the time of the consultation.        MEDICAL HISTORY: Past Medical History:  Diagnosis Date   Cancer (HCC)    Hypertension    Stroke (HCC)     ALLERGIES:  has no active allergies.  MEDICATIONS:  Current Outpatient Medications  Medication Sig Dispense Refill   atenolol (TENORMIN) 50 MG tablet Take 1 tablet (50 mg total) by mouth daily. 90 tablet 3   hydrochlorothiazide (HYDRODIURIL) 25 MG tablet TAKE 1 TABLET(25 MG) BY MOUTH EVERY MORNING 90 tablet 1   HYDROcodone-acetaminophen (NORCO) 10-325 MG tablet Take 1 tablet by mouth every 4 (four) hours as needed for moderate pain or severe pain.     levothyroxine (SYNTHROID) 150 MCG tablet Take 1 tablet (150 mcg total) by mouth daily. 30 tablet 1   metFORMIN (GLUCOPHAGE) 500 MG tablet TAKE 1 TABLET BY MOUTH DAILY 90 tablet 1   No current facility-administered medications for this visit.    SURGICAL HISTORY: No past surgical history on file.  REVIEW OF SYSTEMS:  Constitutional: negative Eyes: negative Ears, nose, mouth, throat, and face: negative Respiratory: positive for cough Cardiovascular: negative Gastrointestinal: negative Genitourinary:negative Integument/breast: negative Hematologic/lymphatic: negative Musculoskeletal:negative Neurological: negative Behavioral/Psych: negative Endocrine: negative Allergic/Immunologic: negative   PHYSICAL EXAMINATION: General appearance: alert,  cooperative, fatigued, and no distress Head: Normocephalic, without obvious abnormality,  atraumatic Neck: no adenopathy, no JVD, supple, symmetrical, trachea midline, and thyroid not enlarged, symmetric, no tenderness/mass/nodules Lymph nodes: Cervical, supraclavicular, and axillary nodes normal. Resp: clear to auscultation bilaterally Back: symmetric, no curvature. ROM normal. No CVA tenderness. Cardio: regular rate and rhythm, S1, S2 normal, no murmur, click, rub or gallop GI: soft, non-tender; bowel sounds normal; no masses,  no organomegaly Extremities: extremities normal, atraumatic, no cyanosis or edema Neurologic: Alert and oriented X 3, normal strength and tone. Normal symmetric reflexes. Normal coordination and gait  ECOG PERFORMANCE STATUS: 1 - Symptomatic but completely ambulatory  Blood pressure (!) 154/91, pulse 64, temperature 98.2 F (36.8 C), temperature source Oral, resp. rate 16, height 5\' 7"  (1.702 m), weight 228 lb 4.8 oz (103.6 kg), SpO2 100%.  LABORATORY DATA: Lab Results  Component Value Date   WBC 6.5 05/11/2023   HGB 13.9 05/11/2023   HCT 41.3 05/11/2023   MCV 94.5 05/11/2023   PLT 245 05/11/2023      Chemistry      Component Value Date/Time   NA 140 05/11/2023 1348   K 3.6 05/11/2023 1348   CL 104 05/11/2023 1348   CO2 31 05/11/2023 1348   BUN 18 05/11/2023 1348   CREATININE 1.28 (H) 05/11/2023 1348      Component Value Date/Time   CALCIUM 9.5 05/11/2023 1348   ALKPHOS 70 05/11/2023 1348   AST 27 05/11/2023 1348   ALT 49 (H) 05/11/2023 1348   BILITOT 0.3 05/11/2023 1348       RADIOGRAPHIC STUDIES: CT Abdomen Pelvis Wo Contrast  Result Date: 05/11/2023 CLINICAL DATA:  History of clear cell renal cell carcinoma with rhabdoid features follow-up. * Tracking Code: BO * EXAM: CT CHEST, ABDOMEN AND PELVIS WITHOUT CONTRAST TECHNIQUE: Multidetector CT imaging of the chest, abdomen and pelvis was performed following the standard protocol without IV contrast. RADIATION DOSE REDUCTION: This exam was performed according to the departmental  dose-optimization program which includes automated exposure control, adjustment of the mA and/or kV according to patient size and/or use of iterative reconstruction technique. COMPARISON:  Multiple priors including most recent CT January 30, 2023 FINDINGS: CT CHEST FINDINGS Cardiovascular: Normal caliber thoracic aorta. Normal size heart. No significant pericardial effusion/thickening. Mediastinum/Nodes: No pathologically enlarged mediastinal, hilar or axillary lymph nodes. Lungs/Pleura: No suspicious pulmonary nodules or masses. Subtle apical predominant peribronchovascular ground-glass nodules. Mild paraseptal emphysema. Musculoskeletal: Expansile lesion in the posterior right ninth rib on image 100/4 is unchanged. No new suspicious pulmonary nodules or masses. CT ABDOMEN PELVIS FINDINGS Hepatobiliary: No suspicious hepatic lesion on noncontrast enhanced examination. Hepatic steatosis with focal fatty sparing along the gallbladder fossa. Pancreas: No pancreatic ductal dilation or evidence of acute inflammation. Spleen: No splenomegaly. Adrenals/Urinary Tract: Bilateral adrenal glands appear normal. Left renal mass measures 6.2 x 5.6 cm on image 69/2 previously 6.1 x 5.7 cm when remeasured for consistency. Adjacent hyperdense left lower pole renal lesion measures 18 mm on image 69/2. No contour deforming right renal mass. Urinary bladder is unremarkable for degree of distension. Stomach/Bowel: No radiopaque enteric contrast material was administered. Stomach is unremarkable for degree of distension. Normal appendix. No pathologic dilation of small or large bowel. Vascular/Lymphatic: Normal caliber abdominal aorta. Smooth IVC contours. Prominent retroperitoneal lymph nodes are similar prior including the left periaortic lymph node measuring 8 mm in short axis on image 66/2. Reproductive: Prostate is unremarkable. Other: No significant abdominopelvic free fluid. Musculoskeletal: No aggressive lytic or blastic lesion  of  bone. IMPRESSION: 1. Stable left renal mass. 2. Stable prominent retroperitoneal lymph nodes. 3. Stable expansile lesion in the posterior right ninth rib. 4. No evidence of new or progressive disease in the chest, abdomen or pelvis on noncontrast enhanced CT. 5. 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 6. Hepatic steatosis. 7.  Aortic Atherosclerosis (ICD10-I70.0). Electronically Signed   By: Maudry Mayhew M.D.   On: 05/11/2023 15:46   CT Chest Wo Contrast  Result Date: 05/11/2023 CLINICAL DATA:  History of clear cell renal cell carcinoma with rhabdoid features follow-up. * Tracking Code: BO * EXAM: CT CHEST, ABDOMEN AND PELVIS WITHOUT CONTRAST TECHNIQUE: Multidetector CT imaging of the chest, abdomen and pelvis was performed following the standard protocol without IV contrast. RADIATION DOSE REDUCTION: This exam was performed according to the departmental dose-optimization program which includes automated exposure control, adjustment of the mA and/or kV according to patient size and/or use of iterative reconstruction technique. COMPARISON:  Multiple priors including most recent CT January 30, 2023 FINDINGS: CT CHEST FINDINGS Cardiovascular: Normal caliber thoracic aorta. Normal size heart. No significant pericardial effusion/thickening. Mediastinum/Nodes: No pathologically enlarged mediastinal, hilar or axillary lymph nodes. Lungs/Pleura: No suspicious pulmonary nodules or masses. Subtle apical predominant peribronchovascular ground-glass nodules. Mild paraseptal emphysema. Musculoskeletal: Expansile lesion in the posterior right ninth rib on image 100/4 is unchanged. No new suspicious pulmonary nodules or masses. CT ABDOMEN PELVIS FINDINGS Hepatobiliary: No suspicious hepatic lesion on noncontrast enhanced examination. Hepatic steatosis with focal fatty sparing along the  gallbladder fossa. Pancreas: No pancreatic ductal dilation or evidence of acute inflammation. Spleen: No splenomegaly. Adrenals/Urinary Tract: Bilateral adrenal glands appear normal. Left renal mass measures 6.2 x 5.6 cm on image 69/2 previously 6.1 x 5.7 cm when remeasured for consistency. Adjacent hyperdense left lower pole renal lesion measures 18 mm on image 69/2. No contour deforming right renal mass. Urinary bladder is unremarkable for degree of distension. Stomach/Bowel: No radiopaque enteric contrast material was administered. Stomach is unremarkable for degree of distension. Normal appendix. No pathologic dilation of small or large bowel. Vascular/Lymphatic: Normal caliber abdominal aorta. Smooth IVC contours. Prominent retroperitoneal lymph nodes are similar prior including the left periaortic lymph node measuring 8 mm in short axis on image 66/2. Reproductive: Prostate is unremarkable. Other: No significant abdominopelvic free fluid. Musculoskeletal: No aggressive lytic or blastic lesion of bone. IMPRESSION: 1. Stable left renal mass. 2. Stable prominent retroperitoneal lymph nodes. 3. Stable expansile lesion in the posterior right ninth rib. 4. No evidence of new or progressive disease in the chest, abdomen or pelvis on noncontrast enhanced CT. 5. 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 6. Hepatic steatosis. 7.  Aortic Atherosclerosis (ICD10-I70.0). Electronically Signed   By: Maudry Mayhew M.D.   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  He is status post renal biopsy on September 14, 2020 confirming the presence of clear-cell renal cell  carcinoma with rhabdoid features.  The patient started induction treatment with immunotherapy with ipilimumab 1 Mg/KG and nivolumab 3 mg/KG every 3 weeks for 4 cycles.  First dose was given on October 02, 2020.  He is currently undergoing maintenance treatment with nivolumab 480 Mg IV every 4 weeks.  Status post  30 cycles. The patient is currently on observation and he is feeling fine with no concerning complaints. He had repeat CT scan of the chest, abdomen and pelvis that showed no concerning findings for disease progression.    Stage 4 Left Renal Cell Carcinoma Stable disease after 30 cycles of nivolumab, with treatment cessation in June 2024. No new symptoms suggestive of disease progression. Reports intermittent pain in the area of the tumor. -Continue surveillance with no current need for treatment. -Repeat imaging in 3 months.  Tobacco Use Patient continues to smoke and has difficulty quitting. -Encouraged gradual reduction in smoking.  Hypertension Elevated blood pressure noted, possibly due to white coat syndrome. -Monitor blood pressure.  Bowel Irregularity Reports occasional constipation and diarrhea, but no other symptoms suggestive of irritable bowel syndrome. -Monitor symptoms.  General Health Maintenance -Encouraged to continue current lifestyle modifications. -Follow-up in 3 months with repeat imaging.   The patient was advised to call immediately if he has any other concerning symptoms in the interval. The patient voices understanding of current disease status and treatment options and is in agreement with the current care plan.  All questions were answered. The patient knows to call the clinic with any problems, questions or concerns. We can certainly see the patient much sooner if necessary. The total time spent in the appointment was 30 minutes.  Disclaimer: This note was dictated with voice recognition software. Similar sounding words can inadvertently be transcribed  and may not be corrected upon review.

## 2023-05-22 ENCOUNTER — Telehealth: Payer: Self-pay | Admitting: *Deleted

## 2023-05-22 NOTE — Telephone Encounter (Signed)
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  RN LM for dentist to fax [place] their form to be signed.

## 2023-05-23 ENCOUNTER — Other Ambulatory Visit: Payer: Self-pay | Admitting: Medical Oncology

## 2023-05-24 ENCOUNTER — Telehealth: Payer: Self-pay | Admitting: Medical Oncology

## 2023-05-24 NOTE — Telephone Encounter (Signed)
Dental clearance form faxed to Dentist with med/allergy list.

## 2023-06-16 ENCOUNTER — Telehealth: Payer: Self-pay | Admitting: Medical Oncology

## 2023-06-16 NOTE — Telephone Encounter (Signed)
Renewal for [place] [facility]. Pt will fax form nad will Mohamed complete it.

## 2023-08-07 ENCOUNTER — Inpatient Hospital Stay: Payer: Commercial Managed Care - HMO | Attending: Oncology

## 2023-08-07 ENCOUNTER — Other Ambulatory Visit (HOSPITAL_COMMUNITY): Payer: Commercial Managed Care - HMO

## 2023-08-07 ENCOUNTER — Other Ambulatory Visit: Payer: Self-pay

## 2023-08-07 DIAGNOSIS — C642 Malignant neoplasm of left kidney, except renal pelvis: Secondary | ICD-10-CM | POA: Insufficient documentation

## 2023-08-07 LAB — CBC WITH DIFFERENTIAL (CANCER CENTER ONLY)
Abs Immature Granulocytes: 0.07 10*3/uL (ref 0.00–0.07)
Basophils Absolute: 0 10*3/uL (ref 0.0–0.1)
Basophils Relative: 1 %
Eosinophils Absolute: 0.2 10*3/uL (ref 0.0–0.5)
Eosinophils Relative: 3 %
HCT: 44.5 % (ref 39.0–52.0)
Hemoglobin: 14.9 g/dL (ref 13.0–17.0)
Immature Granulocytes: 1 %
Lymphocytes Relative: 19 %
Lymphs Abs: 1.6 10*3/uL (ref 0.7–4.0)
MCH: 31.2 pg (ref 26.0–34.0)
MCHC: 33.5 g/dL (ref 30.0–36.0)
MCV: 93.1 fL (ref 80.0–100.0)
Monocytes Absolute: 0.4 10*3/uL (ref 0.1–1.0)
Monocytes Relative: 5 %
Neutro Abs: 5.8 10*3/uL (ref 1.7–7.7)
Neutrophils Relative %: 71 %
Platelet Count: 296 10*3/uL (ref 150–400)
RBC: 4.78 MIL/uL (ref 4.22–5.81)
RDW: 12.9 % (ref 11.5–15.5)
WBC Count: 8.1 10*3/uL (ref 4.0–10.5)
nRBC: 0 % (ref 0.0–0.2)

## 2023-08-07 LAB — CMP (CANCER CENTER ONLY)
ALT: 35 U/L (ref 0–44)
AST: 15 U/L (ref 15–41)
Albumin: 4.3 g/dL (ref 3.5–5.0)
Alkaline Phosphatase: 71 U/L (ref 38–126)
Anion gap: 6 (ref 5–15)
BUN: 20 mg/dL (ref 6–20)
CO2: 30 mmol/L (ref 22–32)
Calcium: 9.8 mg/dL (ref 8.9–10.3)
Chloride: 105 mmol/L (ref 98–111)
Creatinine: 1.22 mg/dL (ref 0.61–1.24)
GFR, Estimated: >60 mL/min (ref >60)
Glucose, Bld: 134 mg/dL — ABNORMAL HIGH (ref 70–99)
Potassium: 3.9 mmol/L (ref 3.5–5.1)
Sodium: 141 mmol/L (ref 135–145)
Total Bilirubin: 0.3 mg/dL (ref 0.0–1.2)
Total Protein: 7.5 g/dL (ref 6.5–8.1)

## 2023-08-07 LAB — LACTATE DEHYDROGENASE: LDH: 167 U/L (ref 98–192)

## 2023-08-14 ENCOUNTER — Inpatient Hospital Stay: Payer: Commercial Managed Care - HMO | Admitting: Physician Assistant

## 2023-08-28 ENCOUNTER — Telehealth: Payer: Self-pay | Admitting: Internal Medicine

## 2023-08-28 NOTE — Telephone Encounter (Signed)
patient called to reschedule appointments due to work schedule conflict. Patient stated he may not want to reschedule due to being stage 4 and feels there is no need. MD made aware and RNs aware of patient's thoughts. Cancelled patient's appointments and told patient to call if/when he feels ready to reschedule.

## 2023-09-02 ENCOUNTER — Ambulatory Visit (HOSPITAL_COMMUNITY): Payer: Commercial Managed Care - HMO

## 2023-09-05 ENCOUNTER — Inpatient Hospital Stay: Payer: Commercial Managed Care - HMO | Admitting: Physician Assistant

## 2023-10-02 ENCOUNTER — Other Ambulatory Visit: Payer: Self-pay | Admitting: Internal Medicine

## 2023-10-02 ENCOUNTER — Ambulatory Visit (HOSPITAL_COMMUNITY)
Admission: RE | Admit: 2023-10-02 | Discharge: 2023-10-02 | Disposition: A | Discharge status: Home / Self Care | Payer: Commercial Managed Care - HMO | Source: Ambulatory Visit | Attending: Internal Medicine | Admitting: Internal Medicine

## 2023-10-02 DIAGNOSIS — C642 Malignant neoplasm of left kidney, except renal pelvis: Secondary | ICD-10-CM

## 2023-10-16 ENCOUNTER — Other Ambulatory Visit: Payer: Self-pay | Admitting: Internal Medicine

## 2023-10-16 ENCOUNTER — Other Ambulatory Visit (HOSPITAL_COMMUNITY): Payer: Self-pay

## 2023-10-16 ENCOUNTER — Inpatient Hospital Stay: Payer: Commercial Managed Care - HMO | Attending: Oncology | Admitting: Internal Medicine

## 2023-10-16 ENCOUNTER — Telehealth: Payer: Self-pay | Admitting: Internal Medicine

## 2023-10-16 ENCOUNTER — Telehealth: Payer: Self-pay | Admitting: Pharmacy Technician

## 2023-10-16 ENCOUNTER — Telehealth: Payer: Self-pay | Admitting: Pharmacist

## 2023-10-16 VITALS — BP 139/83 | HR 74 | Temp 97.3°F | Resp 16 | Ht 67.0 in | Wt 220.8 lb

## 2023-10-16 DIAGNOSIS — N400 Enlarged prostate without lower urinary tract symptoms: Secondary | ICD-10-CM | POA: Insufficient documentation

## 2023-10-16 DIAGNOSIS — C7801 Secondary malignant neoplasm of right lung: Secondary | ICD-10-CM | POA: Diagnosis not present

## 2023-10-16 DIAGNOSIS — C7802 Secondary malignant neoplasm of left lung: Secondary | ICD-10-CM | POA: Diagnosis not present

## 2023-10-16 DIAGNOSIS — C642 Malignant neoplasm of left kidney, except renal pelvis: Secondary | ICD-10-CM | POA: Insufficient documentation

## 2023-10-16 DIAGNOSIS — C779 Secondary and unspecified malignant neoplasm of lymph node, unspecified: Secondary | ICD-10-CM | POA: Diagnosis not present

## 2023-10-16 DIAGNOSIS — R109 Unspecified abdominal pain: Secondary | ICD-10-CM | POA: Diagnosis not present

## 2023-10-16 MED ORDER — CABOMETYX 40 MG PO TABS
40.0000 mg | ORAL_TABLET | Freq: Every day | ORAL | 2 refills | Status: DC
Start: 2023-10-16 — End: 2024-01-19
  Filled 2023-10-17: qty 30, 30d supply, fill #0
  Filled 2023-11-09: qty 30, 30d supply, fill #1
  Filled 2023-12-15: qty 30, 30d supply, fill #2

## 2023-10-16 NOTE — Telephone Encounter (Signed)
 Oral Oncology Pharmacist Encounter  Received new prescription for Cabometyx (cabozantinib) for the treatment of metastatic clear cell renal cell carcinoma, planned duration until disease progression or unacceptable drug toxicity.  CBC w/ Diff and CMP from 08/07/23 assessed, noted Scr of 1.22 mg/dL - no renal dose adjustments required for Cabometyx. Prescription dose and frequency assessed for appropriateness.  Current medication list in Epic reviewed, no relevant/significant DDIs with Cabometyx identified.  Evaluated chart and no patient barriers to medication adherence noted.   Prescription has been e-scribed to the [facility] for benefits analysis and approval.  Oral Oncology Clinic will continue to follow for insurance authorization, copayment issues, initial counseling and start date.  Sherry Ruffing, PharmD, BCPS, BCOP Hematology/Oncology Clinical Pharmacist [facility] [phone] 10/16/2023 1:32 PM

## 2023-10-16 NOTE — Progress Notes (Signed)
 DISCONTINUE ON PATHWAY REGIMEN - Renal Cell     A cycle is every 21 days:     Nivolumab      Ipilimumab    A cycle is every 28 days:     Nivolumab   **Always confirm dose/schedule in your pharmacy ordering system**  PRIOR TREATMENT: [id number]: Nivolumab 3 mg/kg + Ipilimumab 1 mg/kg q21 Days x 4 Cycles, Followed by Nivolumab 480 mg q28 Days  START ON PATHWAY REGIMEN - Renal Cell     A cycle is every 28 days:     Cabozantinib (tablet)   **Always confirm dose/schedule in your pharmacy ordering system**  Patient Characteristics: Stage IV (Unresected T4M0 or Any T, M1)/Metastatic Disease, Clear Cell, Second Line, Prior Checkpoint Inhibitor and No Prior VEGF Inhibitor Therapeutic Status: Stage IV (Unresected T4M0 or Any T, M1)/Metastatic Disease Histology: Clear Cell Line of Therapy: Second Line Intent of Therapy: Non-Curative / Palliative Intent, Discussed with Patient

## 2023-10-16 NOTE — Progress Notes (Signed)
 [facility] [phone]   [phone]  OFFICE PROGRESS NOTE  [name], MD 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  PRIOR THERAPY:  1) Status post renal biopsy on September 14, 2020 confirming the presence of clear-cell renal cell carcinoma with rhabdoid features. 2) status post induction treatment with immunotherapy with ipilimumab 1 Mg/KG and nivolumab 3 mg/KG every 3 weeks for 4 cycles.  First dose was given on October 02, 2020. 3) Maintenance treatment with nivolumab 480 Mg IV every 4 weeks.  Status post 30 cycles.  CURRENT THERAPY: Observation  INTERVAL HISTORY: Bob Ewing 53 y.o. male returns to the clinic today for 56-month follow-up visit.Discussed the use of AI scribe software for clinical note transcription with the patient, who gave verbal consent to proceed.  History of Present Illness   Bob Ewing is a 53 year old male with stage four renal cell carcinoma who presents with left flank pain.  He was diagnosed with stage four renal cell carcinoma in February 2022. Initial treatment included immunotherapy with ipilimumab and nivolumab for four cycles, followed by maintenance nivolumab for almost two years. His last nivolumab treatment was in July 2024. 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  He has been experiencing left flank pain for approximately two months, which has intensified over the past week. The pain is located in the left flank area, sometimes feeling like it is in the kidney and other times in the back. 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 He has been using Tylenol for relief.  A recent scan was performed almost two weeks  ago, but the results have not yet been read. He is concerned about the delay in receiving the scan results and the potential implications of his pain.  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        MEDICAL HISTORY: Past Medical History:  Diagnosis Date   Cancer (HCC)    Hypertension    Stroke (HCC)     ALLERGIES:  has no active allergies.  MEDICATIONS:  Current Outpatient Medications  Medication Sig Dispense Refill   amoxicillin (AMOXIL) 500 MG capsule Take 500 mg by mouth every 8 (eight) hours.     atenolol (TENORMIN) 50 MG tablet Take 1 tablet (50 mg total) by mouth daily. 90 tablet 3   hydrochlorothiazide (HYDRODIURIL) 25 MG tablet TAKE 1 TABLET(25 MG) BY MOUTH EVERY MORNING 90 tablet 1   HYDROcodone-acetaminophen (NORCO) 10-325 MG tablet Take 1 tablet by mouth every 4 (four) hours as needed for moderate pain or severe pain.     levothyroxine (SYNTHROID) 150 MCG tablet Take 1 tablet (150 mcg total) by mouth daily. 30 tablet 1   metFORMIN (GLUCOPHAGE) 500 MG tablet TAKE 1 TABLET BY MOUTH DAILY 90 tablet 1   No current facility-administered medications for this visit.    SURGICAL HISTORY: No past surgical history on file.  REVIEW OF SYSTEMS:  Constitutional: positive for fatigue Eyes: negative Ears, nose, mouth, throat, and face: negative Respiratory: negative Cardiovascular: negative Gastrointestinal: negative Genitourinary:negative Integument/breast: negative Hematologic/lymphatic: negative Musculoskeletal:positive for back pain Neurological: negative Behavioral/Psych: negative Endocrine: negative Allergic/Immunologic: negative   PHYSICAL EXAMINATION: General appearance: alert, cooperative, fatigued, and no  distress Head: Normocephalic, without obvious abnormality, atraumatic Neck: no adenopathy, no JVD, supple, symmetrical, trachea midline, and thyroid not enlarged, symmetric, no tenderness/mass/nodules Lymph nodes:  Cervical, supraclavicular, and axillary nodes normal. Resp: clear to auscultation bilaterally Back: symmetric, no curvature. ROM normal. No CVA tenderness. Cardio: regular rate and rhythm, S1, S2 normal, no murmur, click, rub or gallop GI: soft, non-tender; bowel sounds normal; no masses,  no organomegaly Extremities: extremities normal, atraumatic, no cyanosis or edema Neurologic: Alert and oriented X 3, normal strength and tone. Normal symmetric reflexes. Normal coordination and gait  ECOG PERFORMANCE STATUS: 1 - Symptomatic but completely ambulatory  Blood pressure 139/83, pulse 74, temperature (!) 97.3 F (36.3 C), temperature source Temporal, resp. rate 16, height 5\' 7"  (1.702 m), weight 220 lb 12.8 oz (100.2 kg), SpO2 100%.  LABORATORY DATA: Lab Results  Component Value Date   WBC 8.1 08/07/2023   HGB 14.9 08/07/2023   HCT 44.5 08/07/2023   MCV 93.1 08/07/2023   PLT 296 08/07/2023      Chemistry      Component Value Date/Time   NA 141 08/07/2023 0853   K 3.9 08/07/2023 0853   CL 105 08/07/2023 0853   CO2 30 08/07/2023 0853   BUN 20 08/07/2023 0853   CREATININE 1.22 08/07/2023 0853      Component Value Date/Time   CALCIUM 9.8 08/07/2023 0853   ALKPHOS 71 08/07/2023 0853   AST 15 08/07/2023 0853   ALT 35 08/07/2023 0853   BILITOT 0.3 08/07/2023 0853       RADIOGRAPHIC STUDIES: CT CHEST ABDOMEN PELVIS WO CONTRAST Result Date: 10/16/2023 CLINICAL DATA:  Kidney cancer, staging.  * Tracking Code: BO * EXAM: CT CHEST, ABDOMEN AND PELVIS WITHOUT CONTRAST TECHNIQUE: Multidetector CT imaging of the chest, abdomen and pelvis was performed following the standard protocol without IV contrast. RADIATION DOSE REDUCTION: This exam was performed according to the departmental dose-optimization program which includes automated exposure control, adjustment of the mA and/or kV according to patient size and/or use of iterative reconstruction technique. COMPARISON:  05/11/2023. FINDINGS:  CT CHEST FINDINGS Cardiovascular: Heart is at the upper limits of normal in size to mildly enlarged. No pericardial effusion. Mediastinum/Nodes: No pathologically enlarged mediastinal or axillary lymph nodes. Hilar regions are difficult to definitively evaluate without IV contrast. Esophagus is grossly unremarkable. Lungs/Pleura: Mild paraseptal emphysema. Smoking related respiratory bronchiolitis. No suspicious pulmonary nodules. No pleural fluid. Airway is unremarkable. Musculoskeletal: Stable mildly expansile lesion in the posterior right ninth rib. Minimal degenerative change in the spine. No worrisome lytic or sclerotic lesions. Question avascular necrosis in the left humeral head versus a posttraumatic defect, chronically present. CT ABDOMEN PELVIS FINDINGS Hepatobiliary: Liver is mildly heterogeneous in attenuation. Liver and gallbladder are otherwise unremarkable. No biliary ductal dilatation. Pancreas: Negative. Spleen: Negative. Adrenals/Urinary Tract: Adrenal glands and right kidney are unremarkable. Heterogeneous mass in the lower pole left kidney measures 7.0 x 10.4 cm, increased from 5.6 x 6.2 cm. Adjacent smaller hyperdense heterogeneous nodule in the lateral lower pole measures 1.3 x 2.2 cm, stable. New obstruction of the left upper pole calices. Enlarging adjacent left periaortic adenopathy, 2.8 x 3.0 cm (2/70), previously 8 mm in short axis. Ureters are decompressed. Bladder is mildly thick-walled. Stomach/Bowel: Stomach, small bowel, appendix and colon are unremarkable. Vascular/Lymphatic: Multiple subcentimeter short axis abdominal and pelvic retroperitoneal lymph nodes, as before. Subcentimeter short axis inguinal lymph nodes, also similar. Reproductive: Prostate is mildly enlarged. Other: No free fluid.  Mesenteries and peritoneum are unremarkable. Musculoskeletal: No  worrisome lytic or sclerotic lesions. IMPRESSION: 1. Enlarging left renal cell carcinoma and adjacent left periaortic adenopathy  with new obstruction of the left upper pole calices. Numerous small abdominal and pelvic retroperitoneal lymph nodes, as before. 2. Stable mildly expansile lesion in the right ninth rib. 3. Liver appears steatotic. 4. Enlarged prostate. 5.  Emphysema (ICD10-J43.9). Electronically Signed   By: Leanna Battles M.D.   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  The patient started induction treatment with immunotherapy with ipilimumab 1 Mg/KG and nivolumab 3 mg/KG every 3 weeks for 4 cycles.  First dose was given on October 02, 2020.  He is currently undergoing maintenance treatment with nivolumab 480 Mg IV every 4 weeks.  Status post 30 cycles. The patient is currently on observation and he is feeling fine with no concerning complaints except for the left flank pain. He had repeat CT scan of the chest, abdomen and pelvis but unfortunately the final report became available after he left the clinic.  That showed enlarging left renal cell carcinoma with adjacent left periaortic adenopathy with new obstruction of the left upper pole calyces.     Stage IV renal cell carcinoma Diagnosed in February 2022. Treated with immunotherapy: four cycles of ipilimumab and nivolumab, followed by nivolumab for nearly two years, with the last treatment in July 2024. Current concern is left flank pain, which began as mild pain two months ago and intensified last week. Pain is sometimes perceived as renal or back pain. Recent imaging showed no significant changes; official report pending. Differential diagnosis includes muscle pain from prolonged sitting or disease progression.  Discussed potential nephrectomy if the kidney is the sole site of progression and pain source. Nephrectomy may prevent further spread and alleviate pain, though recurrence elsewhere is possible. Two perspectives: one suggests leaving the kidney if it doesn't impact overall disease, the other recommends removal to potentially benefit systemic health. - Await scan report for further assessment. The final report from the CT scan of the chest, abdomen and pelvis was available after the patient left the clinic and that showed enlarging left renal cell carcinoma with adjacent left periaortic adenopathy with new obstruction of the left upper pole calyces.  There was numerous small abdominal and pelvic retroperitoneal lymph nodes as before and a stable mildly expansive lesion in the right ninth rib.  The scan also showed steatotic liver with enlarged prostate - Consider referral to urologist for nephrectomy if kidney is the only site of disease progression and causing pain - I will call the patient with the scan results and discussion of his treatment options including restarting treatment with tyrosine kinase inhibitor like Cabometyx versus referral to neurology for consideration of left nephrectomy followed by adjuvant treatment.  I will see the patient sooner for discussion of these treatment options.  Left flank pain Pain in the left flank area, possibly related to renal cell carcinoma. - Consider muscle pain management if scan shows no concerning findings related to renal cell carcinoma     The patient voices understanding of current disease status and treatment options and is in agreement with the current care plan.  All questions were answered.  The patient knows to call the clinic with any problems, questions or concerns. We can certainly see the patient much sooner if necessary. The total time spent in the appointment was 30 minutes.  Disclaimer: This note was dictated with voice recognition software.  Similar sounding words can inadvertently be transcribed and may not be corrected upon review.

## 2023-10-16 NOTE — Telephone Encounter (Signed)
 Oral Oncology Patient Advocate Encounter   Received notification that prior authorization for Cabometyx is required.   PA submitted on 10/16/23 via phone [phone] Status is pending     Omer Jack, CPhT-Adv Oncology Pharmacy Patient Advocate [facility]  Direct Number: [phone]  Fax: [phone]

## 2023-10-16 NOTE — Telephone Encounter (Signed)
 Oral Oncology Patient Advocate Encounter  Prior Authorization for Cabometyx has been approved.    PA# [id number] Effective dates: 10/16/23 through 10/15/24  Patients co-pay is $2,866.88.    Omer Jack, CPhT-Adv Oncology Pharmacy Patient Advocate [facility]  Direct Number: [phone]  Fax: [phone]

## 2023-10-17 ENCOUNTER — Other Ambulatory Visit (HOSPITAL_COMMUNITY): Payer: Self-pay

## 2023-10-17 ENCOUNTER — Telehealth: Payer: Self-pay | Admitting: Pharmacy Technician

## 2023-10-17 ENCOUNTER — Other Ambulatory Visit: Payer: Self-pay | Admitting: Pharmacy Technician

## 2023-10-17 NOTE — Telephone Encounter (Signed)
 Oral Oncology Patient Advocate Encounter   Was successful in obtaining a copay card for Cabometyx.  This copay card will make the patients copay $0.  I have spoken with the patient.    The billing information is as follows and has been shared with [facility].   RxBin: [id number] PCN: Loyalty Member ID: [id number] Group ID: [id number]   Omer Jack, CPhT-Adv Oncology Pharmacy Patient Advocate [facility]  Direct Number: [phone]  Fax: [phone]

## 2023-10-17 NOTE — Progress Notes (Signed)
 Oral Chemotherapy Pharmacist Encounter  Patient was counseled under telephone encounter from 10/16/23.  Sherry Ruffing, PharmD, BCPS, BCOP Hematology/Oncology Clinical Pharmacist [facility] Oral Chemotherapy Navigation Clinics [phone] 10/17/2023 10:48 AM

## 2023-10-17 NOTE — Telephone Encounter (Signed)
 Oral Chemotherapy Pharmacist Encounter  I spoke with patient for overview of: Cabometyx (cabozantinib) for the treatment of metastatic, clear cell, renal cell carcinoma, planned duration until disease progression or unacceptable toxicity.   Counseled patient on administration, dosing, side effects, monitoring, drug-food interactions, safe handling, storage, and disposal.  Patient will take Cabometyx 40mg  tablets, 1 tablet (40mg ) by mouth once daily on an empty stomach, 1 hour before or 2 hours after a meal.  Patient knows to avoid grapefruit and grapefruit juice.  Cabometyx start date: 10/22/23  Adverse effects include but are not limited to: diarrhea, nausea, decreased appetite, fatigue, hypertension, hand-foot syndrome, decreased blood counts, and electrolyte abnormalities. Patient will obtain anti diarrheal and alert the office of 4 or more loose stools above baseline.  Patient informed that Cabometyx should be held at least 3 weeks prior to any scheduled surgery (including dental surgery) and not resumed until at least 2 weeks after major surgery and until adequate wound healing is established.  Reviewed with patient importance of keeping a medication schedule and plan for any missed doses. No barriers to medication adherence identified.  Medication reconciliation performed and medication/allergy list updated.  All questions answered.  Bob Ewing voiced understanding and appreciation.   Medication education handout placed in mail for patient. Patient knows to call the office with questions or concerns. Oral Chemotherapy Clinic phone number provided to patient.   Sherry Ruffing, PharmD, BCPS, BCOP Hematology/Oncology Clinical Pharmacist [facility] Oral Chemotherapy Navigation Clinics [phone] 10/17/2023 10:22 AM

## 2023-10-17 NOTE — Progress Notes (Signed)
 Specialty Pharmacy Initial Fill Coordination Note  Bob Ewing is a 53 y.o. male contacted today regarding refills of specialty medication(s) Cabozantinib S-Malate (Cabometyx) .  Patient requested Bob Ewing at [facility] [place]  on 10/21/23   Medication will be filled on 10/19/23.   Patient is aware of $0 copayment.

## 2023-10-19 ENCOUNTER — Other Ambulatory Visit: Payer: Self-pay

## 2023-11-02 ENCOUNTER — Other Ambulatory Visit (HOSPITAL_COMMUNITY): Payer: Self-pay

## 2023-11-09 ENCOUNTER — Other Ambulatory Visit: Payer: Self-pay

## 2023-11-09 ENCOUNTER — Inpatient Hospital Stay: Attending: Oncology

## 2023-11-09 ENCOUNTER — Other Ambulatory Visit (HOSPITAL_COMMUNITY): Payer: Self-pay

## 2023-11-09 ENCOUNTER — Inpatient Hospital Stay (HOSPITAL_BASED_OUTPATIENT_CLINIC_OR_DEPARTMENT_OTHER): Admitting: Internal Medicine

## 2023-11-09 VITALS — BP 161/100 | HR 66 | Temp 97.7°F | Resp 17 | Wt 219.0 lb

## 2023-11-09 DIAGNOSIS — C642 Malignant neoplasm of left kidney, except renal pelvis: Secondary | ICD-10-CM

## 2023-11-09 DIAGNOSIS — R109 Unspecified abdominal pain: Secondary | ICD-10-CM | POA: Diagnosis not present

## 2023-11-09 DIAGNOSIS — E039 Hypothyroidism, unspecified: Secondary | ICD-10-CM | POA: Diagnosis not present

## 2023-11-09 DIAGNOSIS — I10 Essential (primary) hypertension: Secondary | ICD-10-CM | POA: Diagnosis not present

## 2023-11-09 DIAGNOSIS — Z79890 Hormone replacement therapy (postmenopausal): Secondary | ICD-10-CM | POA: Insufficient documentation

## 2023-11-09 DIAGNOSIS — Z9221 Personal history of antineoplastic chemotherapy: Secondary | ICD-10-CM | POA: Diagnosis not present

## 2023-11-09 DIAGNOSIS — R590 Localized enlarged lymph nodes: Secondary | ICD-10-CM | POA: Insufficient documentation

## 2023-11-09 LAB — CMP (CANCER CENTER ONLY)
ALT: 46 U/L — ABNORMAL HIGH (ref 0–44)
AST: 20 U/L (ref 15–41)
Albumin: 3.9 g/dL (ref 3.5–5.0)
Alkaline Phosphatase: 83 U/L (ref 38–126)
Anion gap: 6 (ref 5–15)
BUN: 14 mg/dL (ref 6–20)
CO2: 30 mmol/L (ref 22–32)
Calcium: 9.2 mg/dL (ref 8.9–10.3)
Chloride: 104 mmol/L (ref 98–111)
Creatinine: 1.21 mg/dL (ref 0.61–1.24)
GFR, Estimated: >60 mL/min (ref >60)
Glucose, Bld: 127 mg/dL — ABNORMAL HIGH (ref 70–99)
Potassium: 4.5 mmol/L (ref 3.5–5.1)
Sodium: 140 mmol/L (ref 135–145)
Total Bilirubin: 0.3 mg/dL (ref 0.0–1.2)
Total Protein: 7.4 g/dL (ref 6.5–8.1)

## 2023-11-09 LAB — CBC WITH DIFFERENTIAL (CANCER CENTER ONLY)
Abs Immature Granulocytes: 0.01 10*3/uL (ref 0.00–0.07)
Basophils Absolute: 0 10*3/uL (ref 0.0–0.1)
Basophils Relative: 1 %
Eosinophils Absolute: 0.1 10*3/uL (ref 0.0–0.5)
Eosinophils Relative: 2 %
HCT: 41.2 % (ref 39.0–52.0)
Hemoglobin: 14.2 g/dL (ref 13.0–17.0)
Immature Granulocytes: 0 %
Lymphocytes Relative: 17 %
Lymphs Abs: 1 10*3/uL (ref 0.7–4.0)
MCH: 30.5 pg (ref 26.0–34.0)
MCHC: 34.5 g/dL (ref 30.0–36.0)
MCV: 88.6 fL (ref 80.0–100.0)
Monocytes Absolute: 0.3 10*3/uL (ref 0.1–1.0)
Monocytes Relative: 6 %
Neutro Abs: 4.4 10*3/uL (ref 1.7–7.7)
Neutrophils Relative %: 74 %
Platelet Count: 384 10*3/uL (ref 150–400)
RBC: 4.65 MIL/uL (ref 4.22–5.81)
RDW: 12.6 % (ref 11.5–15.5)
WBC Count: 5.9 10*3/uL (ref 4.0–10.5)
nRBC: 0 % (ref 0.0–0.2)

## 2023-11-09 NOTE — Progress Notes (Signed)
 [facility] [phone]   [phone]  OFFICE PROGRESS NOTE  [name], MD 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  PRIOR THERAPY:  1) Status post renal biopsy on September 14, 2020 confirming the presence of clear-cell renal cell carcinoma with rhabdoid features. 2) status post induction treatment with immunotherapy with ipilimumab 1 Mg/KG and nivolumab 3 mg/KG every 3 weeks for 4 cycles.  First dose was given on October 02, 2020. 3) Maintenance treatment with nivolumab 480 Mg IV every 4 weeks.  Status post 30 cycles.  CURRENT THERAPY: Cabometyx 40 mg p.o. daily started on October 22, 2023  INTERVAL HISTORY: Bob Ewing 53 y.o. male returns to the clinic today for 68-month follow-up visit.Discussed the use of AI scribe software for clinical note transcription with the patient, who gave verbal consent to proceed.  History of Present Illness   Bob Ewing is a 53 year old male with stage four left clear cell renal cell carcinoma who presents for evaluation and repeat blood work for close monitoring of his condition.  He was diagnosed with stage four left clear cell renal cell carcinoma in February 2022, with pulmonary metastasis and mediastinal lymphadenopathy. Initial treatment included four cycles of ipilimumab and nivolumab, followed by three cycles of maintenance nivolumab. Recent imaging indicated disease progression, leading to the initiation of cabozantinib around October 21, 2023.  Since starting cabozantinib, he has not experienced significant side effects except for elevated blood pressure. Blood pressure readings at home have been consistently high, sometimes reaching 140/90 mmHg, despite taking atenolol. His blood pressure was previously normal before starting  cabozantinib.  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 He is also taking levothyroxine without any reported issues. He is concerned about managing his health alongside work commitments and Herbalist, including loans and business obligations.        MEDICAL HISTORY: Past Medical History:  Diagnosis Date   Cancer (HCC)    Hypertension    Stroke (HCC)     ALLERGIES:  has no active allergies.  MEDICATIONS:  Current Outpatient Medications  Medication Sig Dispense Refill   amoxicillin (AMOXIL) 500 MG capsule Take 500 mg by mouth every 8 (eight) hours.     atenolol (TENORMIN) 50 MG tablet Take 1 tablet (50 mg total) by mouth daily. 90 tablet 3   cabozantinib (CABOMETYX) 40 MG tablet Take 1 tablet (40 mg total) by mouth daily. Take on an empty stomach, 1 hour before or 2 hours after meals. 30 tablet 2   hydrochlorothiazide (HYDRODIURIL) 25 MG tablet TAKE 1 TABLET(25 MG) BY MOUTH EVERY MORNING 90 tablet 1   HYDROcodone-acetaminophen (NORCO) 10-325 MG tablet Take 1 tablet by mouth every 4 (four) hours as needed for moderate pain or severe pain.     levothyroxine (SYNTHROID) 150 MCG tablet Take 1 tablet (150 mcg total) by mouth daily. 30 tablet 1   metFORMIN (GLUCOPHAGE) 500 MG tablet TAKE 1 TABLET BY MOUTH DAILY 90 tablet 1   No current facility-administered medications for this visit.    SURGICAL HISTORY: No past surgical history on file.  REVIEW OF SYSTEMS:  Constitutional: positive for fatigue Eyes: negative Ears, nose, mouth, throat, and face: negative Respiratory: negative Cardiovascular: negative Gastrointestinal: negative Genitourinary:negative Integument/breast: negative Hematologic/lymphatic: negative Musculoskeletal:positive  for arthralgias Neurological: negative Behavioral/Psych: negative Endocrine: negative Allergic/Immunologic: negative   PHYSICAL EXAMINATION: General appearance: alert,  cooperative, fatigued, and no distress Head: Normocephalic, without obvious abnormality, atraumatic Neck: no adenopathy, no JVD, supple, symmetrical, trachea midline, and thyroid not enlarged, symmetric, no tenderness/mass/nodules Lymph nodes: Cervical, supraclavicular, and axillary nodes normal. Resp: clear to auscultation bilaterally Back: symmetric, no curvature. ROM normal. No CVA tenderness. Cardio: regular rate and rhythm, S1, S2 normal, no murmur, click, rub or gallop GI: soft, non-tender; bowel sounds normal; no masses,  no organomegaly Extremities: extremities normal, atraumatic, no cyanosis or edema Neurologic: Alert and oriented X 3, normal strength and tone. Normal symmetric reflexes. Normal coordination and gait  ECOG PERFORMANCE STATUS: 1 - Symptomatic but completely ambulatory  Blood pressure (!) 161/100, pulse 66, temperature 97.7 F (36.5 C), temperature source Temporal, resp. rate 17, weight 219 lb (99.3 kg), SpO2 100%.  LABORATORY DATA: Lab Results  Component Value Date   WBC 5.9 11/09/2023   HGB 14.2 11/09/2023   HCT 41.2 11/09/2023   MCV 88.6 11/09/2023   PLT 384 11/09/2023      Chemistry      Component Value Date/Time   NA 140 11/09/2023 0956   K 4.5 11/09/2023 0956   CL 104 11/09/2023 0956   CO2 30 11/09/2023 0956   BUN 14 11/09/2023 0956   CREATININE 1.21 11/09/2023 0956      Component Value Date/Time   CALCIUM 9.2 11/09/2023 0956   ALKPHOS 83 11/09/2023 0956   AST 20 11/09/2023 0956   ALT 46 (H) 11/09/2023 0956   BILITOT 0.3 11/09/2023 0956       RADIOGRAPHIC STUDIES: No results found.   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  He is status post renal biopsy on September 14, 2020 confirming the presence of clear-cell renal cell carcinoma  with rhabdoid features.  The patient started induction treatment with immunotherapy with ipilimumab 1 Mg/KG and nivolumab 3 mg/KG every 3 weeks for 4 cycles.  First dose was given on October 02, 2020.  He is currently undergoing maintenance treatment with nivolumab 480 Mg IV every 4 weeks.  Status post 30 cycles. The patient is currently on observation and he is feeling fine with no concerning complaints except for the left flank pain. He had evidence for disease progression on the last imaging studies and the patient started treatment with Cabometyx 40 mg p.o. daily on October 22, 2023 status post 2 weeks of treatment. Assessment and Plan    Stage IV clear cell renal cell carcinoma with pulmonary metastasis and mediastinal lymphadenopathy Stage IV clear cell renal cell carcinoma diagnosed in February 2022 with pulmonary metastasis and mediastinal lymphadenopathy. Previously treated with ipilimumab and nivolumab, followed by maintenance nivolumab. Currently on cabozantinib due to disease progression on recent imaging. He reports no significant side effects except for hypertension. Cabozantinib can cause hypertension and changes in liver enzymes, necessitating close monitoring. Future treatment decisions, including potential surgery, will be based on response to cabozantinib after 2-3 months. - Continue cabozantinib 4 mg PO daily - Monitor blood pressure regularly at home - Contact office if blood pressure remains at or above 150/90 mmHg - Coordinate with family doctor to adjust atenolol dosage if necessary - Plan for follow-up imaging in 2-3 months to assess treatment response  Hypertension Hypertension with recent readings  of 160/100 mmHg, likely exacerbated by cabozantinib. 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 Cabozantinib is known to elevate blood pressure, requiring regular monitoring and potential medication  adjustment. - Monitor blood pressure regularly at home - Contact office if blood pressure remains at or above 150/90 mmHg - Coordinate with family doctor to adjust atenolol dosage if necessary  Hypothyroidism He is on levothyroxine for hypothyroidism. No current issues reported with the medication. - Continue levothyroxine as prescribed - Await results of thyroid function tests  Follow-up Requires close monitoring due to new treatment with cabozantinib and potential side effects. Initial follow-up in 2-3 weeks is necessary to assess tolerance and side effects, with the possibility of extending intervals to monthly or every six weeks once stable. - Schedule follow-up appointment in 2-3 weeks, flexible to accommodate work schedule - Consider extending follow-up intervals to monthly or every six weeks once treatment response and side effects are stable   The patient was advised to call immediately if he has any concerning symptoms in the interval. The patient voices understanding of current disease status and treatment options and is in agreement with the current care plan.  All questions were answered. The patient knows to call the clinic with any problems, questions or concerns. We can certainly see the patient much sooner if necessary. The total time spent in the appointment was 30 minutes.  Disclaimer: This note was dictated with voice recognition software. Similar sounding words can inadvertently be transcribed and may not be corrected upon review.

## 2023-11-09 NOTE — Progress Notes (Signed)
 Specialty Pharmacy Ongoing Clinical Assessment Note  Bob Ewing is a 53 y.o. male who is being followed by the specialty pharmacy service for Rx[facility] Oncology   Patient's specialty medication(s) reviewed today: Cabozantinib S-Malate (Cabometyx)   Missed doses in the last 4 weeks: 0   Patient/Caregiver asked additional questions regarding see below.  Therapeutic benefit summary: Unable to assess   Adverse events/side effects summary: Experienced adverse events/side effects (HTN and dry mouth (see intervention notes below))   Patient's therapy is appropriate to: Continue    Goals Addressed             This Visit's Progress    Slow Disease Progression   No change    Patient is initiating therapy. Patient will maintain adherence.         Follow up:  3 months  Servando Snare Specialty Pharmacist    Clinical Intervention Note  Clinical Intervention Notes: Patient reported hypertension, which is not yet well-controlled, his provider is aware and treating and I recommended that he reach out and follow up.  Also, patient complained of dry mouth, I recommened Biotene mouthwash over the counter, he said he will try this.  Patient was understanding and all questions were answered.   Clinical Intervention Outcomes: Prevention of an adverse drug event   [name]

## 2023-11-09 NOTE — Progress Notes (Signed)
 Specialty Pharmacy Refill Coordination Note  Bob Ewing is a 53 y.o. male contacted today regarding refills of specialty medication(s) Cabozantinib S-Malate (Cabometyx)   Patient requested [name] at [facility] at [place] date: 11/10/23   Medication will be filled on 04.10.25.

## 2023-11-30 ENCOUNTER — Inpatient Hospital Stay: Admitting: Internal Medicine

## 2023-11-30 ENCOUNTER — Inpatient Hospital Stay: Attending: Oncology

## 2023-11-30 VITALS — BP 172/97 | HR 73 | Temp 97.1°F | Resp 17 | Ht 67.0 in | Wt 222.1 lb

## 2023-11-30 DIAGNOSIS — I10 Essential (primary) hypertension: Secondary | ICD-10-CM | POA: Insufficient documentation

## 2023-11-30 DIAGNOSIS — C7800 Secondary malignant neoplasm of unspecified lung: Secondary | ICD-10-CM | POA: Diagnosis not present

## 2023-11-30 DIAGNOSIS — R590 Localized enlarged lymph nodes: Secondary | ICD-10-CM | POA: Diagnosis not present

## 2023-11-30 DIAGNOSIS — C642 Malignant neoplasm of left kidney, except renal pelvis: Secondary | ICD-10-CM | POA: Diagnosis present

## 2023-11-30 DIAGNOSIS — R109 Unspecified abdominal pain: Secondary | ICD-10-CM | POA: Insufficient documentation

## 2023-11-30 DIAGNOSIS — Z79620 Long term (current) use of immunosuppressive biologic: Secondary | ICD-10-CM | POA: Diagnosis not present

## 2023-11-30 LAB — CMP (CANCER CENTER ONLY)
ALT: 58 U/L — ABNORMAL HIGH (ref 0–44)
AST: 28 U/L (ref 15–41)
Albumin: 4 g/dL (ref 3.5–5.0)
Alkaline Phosphatase: 84 U/L (ref 38–126)
Anion gap: 5 (ref 5–15)
BUN: 16 mg/dL (ref 6–20)
CO2: 33 mmol/L — ABNORMAL HIGH (ref 22–32)
Calcium: 9.2 mg/dL (ref 8.9–10.3)
Chloride: 101 mmol/L (ref 98–111)
Creatinine: 1.27 mg/dL — ABNORMAL HIGH (ref 0.61–1.24)
GFR, Estimated: >60 mL/min (ref >60)
Glucose, Bld: 171 mg/dL — ABNORMAL HIGH (ref 70–99)
Potassium: 4 mmol/L (ref 3.5–5.1)
Sodium: 139 mmol/L (ref 135–145)
Total Bilirubin: 0.3 mg/dL (ref 0.0–1.2)
Total Protein: 7.2 g/dL (ref 6.5–8.1)

## 2023-11-30 LAB — CBC WITH DIFFERENTIAL (CANCER CENTER ONLY)
Abs Immature Granulocytes: 0.02 10*3/uL (ref 0.00–0.07)
Basophils Absolute: 0 10*3/uL (ref 0.0–0.1)
Basophils Relative: 1 %
Eosinophils Absolute: 0.2 10*3/uL (ref 0.0–0.5)
Eosinophils Relative: 3 %
HCT: 43.5 % (ref 39.0–52.0)
Hemoglobin: 15.1 g/dL (ref 13.0–17.0)
Immature Granulocytes: 0 %
Lymphocytes Relative: 20 %
Lymphs Abs: 1.3 10*3/uL (ref 0.7–4.0)
MCH: 31.1 pg (ref 26.0–34.0)
MCHC: 34.7 g/dL (ref 30.0–36.0)
MCV: 89.7 fL (ref 80.0–100.0)
Monocytes Absolute: 0.3 10*3/uL (ref 0.1–1.0)
Monocytes Relative: 4 %
Neutro Abs: 4.6 10*3/uL (ref 1.7–7.7)
Neutrophils Relative %: 72 %
Platelet Count: 246 10*3/uL (ref 150–400)
RBC: 4.85 MIL/uL (ref 4.22–5.81)
RDW: 15 % (ref 11.5–15.5)
WBC Count: 6.4 10*3/uL (ref 4.0–10.5)
nRBC: 0 % (ref 0.0–0.2)

## 2023-11-30 MED ORDER — HYDROCHLOROTHIAZIDE 25 MG PO TABS: 25.0000 mg | ORAL_TABLET | Freq: Every day | ORAL | 0 refills | Status: DC

## 2023-11-30 NOTE — Progress Notes (Signed)
 [facility] Telephone:([phone]) [phone]   Fax:([phone]  OFFICE PROGRESS NOTE  Wright Heal, MD 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  PRIOR THERAPY:  1) Status post renal biopsy on September 14, 2020 confirming the presence of clear-cell renal cell carcinoma with rhabdoid features. 2) status post induction treatment with immunotherapy with ipilimumab  1 Mg/KG and nivolumab  3 mg/KG every 3 weeks for 4 cycles.  First dose was given on October 02, 2020. 3) Maintenance treatment with nivolumab  480 Mg IV every 4 weeks.  Status post 30 cycles.  CURRENT THERAPY: Cabometyx  40 mg p.o. daily started on October 22, 2023  INTERVAL HISTORY: Bob Ewing 53 y.o. male returns to the clinic today for 62-month follow-up visit.Discussed the use of AI scribe software for clinical note transcription with the patient, who gave verbal consent to proceed.  History of Present Illness   Bob Ewing is a 53 year old male with stage four left clear cell renal cell carcinoma who presents for evaluation and repeat blood work.  He was diagnosed with stage four left clear cell renal cell carcinoma in February 2022, with pulmonary involvement and mediastinal lymphadenopathy. Initial treatment included ipilimumab  and nivolumab , followed by maintenance nivolumab  for two years. In March 2025, disease progression was noted, and he was started on Cabometyx  40 mg PO daily on October 22, 2023.  He experiences pain on the right side of his neck, described as inflammation under the ear, persisting for one week without improvement or worsening. He suspects it might be related to the salivary gland. No swelling of lymph nodes or pain when swallowing. He mentions a recent dental procedure last month and ongoing dental issues on the  right side, which he thinks might be contributing to the pain.  He has a history of hypertension, which has been difficult to control. Blood pressure readings are elevated, around 170/97 mmHg, sometimes reaching 160 mmHg, and rarely below 140 mmHg. He takes hydrochlorothiazide  and atenolol  but reports difficulty managing blood pressure, especially since the kidney condition diagnosis. His previous primary care provider, Dr. Wright Heal, is no longer practicing, affecting his hypertension management. He checks his blood pressure at home, noting it sometimes decreases to 150 mmHg when taking two or three medications. Blood pressure was more stable before starting Cabometyx , with previous readings around 140 mmHg. He is concerned about the impact of his kidney issues on blood pressure management.  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       MEDICAL HISTORY: Past Medical History:  Diagnosis Date   Cancer (HCC)    Hypertension    Stroke (HCC)     ALLERGIES:  has no active allergies.  MEDICATIONS:  Current Outpatient Medications  Medication Sig Dispense Refill   amoxicillin (AMOXIL) 500 MG capsule Take 500 mg by mouth every 8 (eight) hours.     atenolol  (TENORMIN ) 50 MG tablet Take 1 tablet (50 mg total) by mouth daily. 90 tablet 3   cabozantinib  (CABOMETYX ) 40 MG tablet Take 1 tablet (40 mg total) by mouth daily. Take on an empty stomach, 1 hour before or 2 hours after meals. 30 tablet 2   hydrochlorothiazide  (HYDRODIURIL ) 25 MG tablet TAKE 1 TABLET(25 MG) BY MOUTH EVERY MORNING 90 tablet 1   HYDROcodone -acetaminophen  (NORCO)  10-325 MG tablet Take 1 tablet by mouth every 4 (four) hours as needed for moderate pain or severe pain.     levothyroxine  (SYNTHROID ) 150 MCG tablet Take 1 tablet (150 mcg total) by mouth daily. 30 tablet 1   metFORMIN (GLUCOPHAGE) 500 MG tablet TAKE 1 TABLET BY MOUTH DAILY 90 tablet 1   No current facility-administered medications  for this visit.    SURGICAL HISTORY: No past surgical history on file.  REVIEW OF SYSTEMS:  Constitutional: positive for fatigue Eyes: negative Ears, nose, mouth, throat, and face: positive for earaches Respiratory: negative Cardiovascular: negative Gastrointestinal: negative Genitourinary:negative Integument/breast: negative Hematologic/lymphatic: negative Musculoskeletal:positive for arthralgias Neurological: negative Behavioral/Psych: negative Endocrine: negative Allergic/Immunologic: negative   PHYSICAL EXAMINATION: General appearance: alert, cooperative, fatigued, and no distress Head: Normocephalic, without obvious abnormality, atraumatic Neck: no adenopathy, no JVD, supple, symmetrical, trachea midline, and thyroid  not enlarged, symmetric, no tenderness/mass/nodules Lymph nodes: Cervical, supraclavicular, and axillary nodes normal. Resp: clear to auscultation bilaterally Back: symmetric, no curvature. ROM normal. No CVA tenderness. Cardio: regular rate and rhythm, S1, S2 normal, no murmur, click, rub or gallop GI: soft, non-tender; bowel sounds normal; no masses,  no organomegaly Extremities: extremities normal, atraumatic, no cyanosis or edema Neurologic: Alert and oriented X 3, normal strength and tone. Normal symmetric reflexes. Normal coordination and gait  ECOG PERFORMANCE STATUS: 1 - Symptomatic but completely ambulatory  Blood pressure (!) 172/97, pulse 73, temperature (!) 97.1 F (36.2 C), temperature source Temporal, resp. rate 17, height 5\' 7"  (1.702 m), weight 222 lb 1.6 oz (100.7 kg), SpO2 99%.  LABORATORY DATA: Lab Results  Component Value Date   WBC 6.4 11/30/2023   HGB 15.1 11/30/2023   HCT 43.5 11/30/2023   MCV 89.7 11/30/2023   PLT 246 11/30/2023      Chemistry      Component Value Date/Time   NA 140 11/09/2023 0956   K 4.5 11/09/2023 0956   CL 104 11/09/2023 0956   CO2 30 11/09/2023 0956   BUN 14 11/09/2023 0956   CREATININE 1.21  11/09/2023 0956      Component Value Date/Time   CALCIUM 9.2 11/09/2023 0956   ALKPHOS 83 11/09/2023 0956   AST 20 11/09/2023 0956   ALT 46 (H) 11/09/2023 0956   BILITOT 0.3 11/09/2023 0956       RADIOGRAPHIC STUDIES: No results found.   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  The patient started induction treatment with immunotherapy with ipilimumab  1 Mg/KG and nivolumab  3 mg/KG every 3 weeks for 4 cycles.  First dose was given on October 02, 2020.  He is currently undergoing maintenance treatment with nivolumab  480 Mg IV every 4 weeks.  Status post 30 cycles. The patient is currently on observation and he is feeling fine with no concerning complaints except for the left flank pain. He had evidence for disease progression on the last imaging studies and the patient started treatment with Cabometyx  40 mg p.o. daily on October 22, 2023 status post 4 weeks of treatment. He has been tolerating this treatment well with no concerning adverse effect except for fatigue.    Stage 4 left clear cell renal cell carcinoma with metastasis Stage 4 left  clear cell renal cell carcinoma with pulmonary involvement and mediastinal lymphadenopathy, initially diagnosed in February 2022. Previously treated with ipilimumab  and nivolumab , followed by maintenance nivolumab . Disease progression noted in March 2025. Currently on cabozantinib  40 mg PO daily since October 22, 2023. Recent lab work shows good blood count, awaiting chemistry results. - Continue cabozantinib  40 mg PO daily - Schedule follow-up appointment in one month  Hypertension Hypertension with difficulty in control,  previously managed with hydrochlorothiazide  and atenolol . Blood pressure readings at home range from 160/97 to 170/97 mmHg, occasionally reduced to 150 mmHg with multiple medications. Cabozantinib  may contribute to elevated blood pressure. Discussed lifestyle modifications including reducing salt and caffeine intake. Current medications include hydrochlorothiazide  and atenolol . Primary care provider may be closing practice, complicating management. - Provide one-time refill of hydrochlorothiazide  - Refer to internal medicine or family practice at [facility] for ongoing hypertension management - Advise on lifestyle modifications: reduce salt and caffeine intake   The patient was advised to call immediately if she has any other concerning symptoms in the interval.  The patient voices understanding of current disease status and treatment options and is in agreement with the current care plan.  All questions were answered. The patient knows to call the clinic with any problems, questions or concerns. We can certainly see the patient much sooner if necessary. The total time spent in the appointment was 30 minutes.  Disclaimer: This note was dictated with voice recognition software. Similar sounding words can inadvertently be transcribed and may not be corrected upon review.

## 2023-12-01 LAB — TSH: TSH: 7.25 u[IU]/mL — ABNORMAL HIGH (ref 0.350–4.500)

## 2023-12-15 ENCOUNTER — Other Ambulatory Visit: Payer: Self-pay

## 2023-12-15 NOTE — Progress Notes (Signed)
 Specialty Pharmacy Refill Coordination Note  Bob Ewing is a 53 y.o. male contacted today regarding refills of specialty medication(s) Cabozantinib  S-Malate (Cabometyx )   Patient requested [name] at [facility] at [place] date: 12/27/23   Medication will be filled on 05.27.25.      \

## 2023-12-26 ENCOUNTER — Other Ambulatory Visit: Payer: Self-pay

## 2023-12-28 ENCOUNTER — Inpatient Hospital Stay (HOSPITAL_BASED_OUTPATIENT_CLINIC_OR_DEPARTMENT_OTHER): Admitting: Internal Medicine

## 2023-12-28 ENCOUNTER — Inpatient Hospital Stay

## 2023-12-28 VITALS — BP 160/96 | HR 77 | Temp 97.6°F | Resp 17 | Wt 222.6 lb

## 2023-12-28 DIAGNOSIS — C642 Malignant neoplasm of left kidney, except renal pelvis: Secondary | ICD-10-CM | POA: Diagnosis not present

## 2023-12-28 DIAGNOSIS — I10 Essential (primary) hypertension: Secondary | ICD-10-CM

## 2023-12-28 LAB — CBC WITH DIFFERENTIAL (CANCER CENTER ONLY)
Abs Immature Granulocytes: 0.03 10*3/uL (ref 0.00–0.07)
Basophils Absolute: 0 10*3/uL (ref 0.0–0.1)
Basophils Relative: 0 %
Eosinophils Absolute: 0.2 10*3/uL (ref 0.0–0.5)
Eosinophils Relative: 3 %
HCT: 42 % (ref 39.0–52.0)
Hemoglobin: 14.7 g/dL (ref 13.0–17.0)
Immature Granulocytes: 0 %
Lymphocytes Relative: 15 %
Lymphs Abs: 1.1 10*3/uL (ref 0.7–4.0)
MCH: 31.3 pg (ref 26.0–34.0)
MCHC: 35 g/dL (ref 30.0–36.0)
MCV: 89.6 fL (ref 80.0–100.0)
Monocytes Absolute: 0.5 10*3/uL (ref 0.1–1.0)
Monocytes Relative: 7 %
Neutro Abs: 5.2 10*3/uL (ref 1.7–7.7)
Neutrophils Relative %: 75 %
Platelet Count: 257 10*3/uL (ref 150–400)
RBC: 4.69 MIL/uL (ref 4.22–5.81)
RDW: 15.4 % (ref 11.5–15.5)
WBC Count: 6.9 10*3/uL (ref 4.0–10.5)
nRBC: 0 % (ref 0.0–0.2)

## 2023-12-28 LAB — CMP (CANCER CENTER ONLY)
ALT: 40 U/L (ref 0–44)
AST: 21 U/L (ref 15–41)
Albumin: 4 g/dL (ref 3.5–5.0)
Alkaline Phosphatase: 76 U/L (ref 38–126)
Anion gap: 4 — ABNORMAL LOW (ref 5–15)
BUN: 15 mg/dL (ref 6–20)
CO2: 34 mmol/L — ABNORMAL HIGH (ref 22–32)
Calcium: 9.7 mg/dL (ref 8.9–10.3)
Chloride: 96 mmol/L — ABNORMAL LOW (ref 98–111)
Creatinine: 1.31 mg/dL — ABNORMAL HIGH (ref 0.61–1.24)
GFR, Estimated: >60 mL/min (ref >60)
Glucose, Bld: 194 mg/dL — ABNORMAL HIGH (ref 70–99)
Potassium: 4.2 mmol/L (ref 3.5–5.1)
Sodium: 134 mmol/L — ABNORMAL LOW (ref 135–145)
Total Bilirubin: 0.3 mg/dL (ref 0.0–1.2)
Total Protein: 7.2 g/dL (ref 6.5–8.1)

## 2023-12-28 NOTE — Progress Notes (Signed)
 [facility] [phone]   [phone]  OFFICE PROGRESS NOTE  Wright Heal, MD 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  PRIOR THERAPY:  1) Status post renal biopsy on September 14, 2020 confirming the presence of clear-cell renal cell carcinoma with rhabdoid features. 2) status post induction treatment with immunotherapy with ipilimumab  1 Mg/KG and nivolumab  3 mg/KG every 3 weeks for 4 cycles.  First dose was given on October 02, 2020. 3) Maintenance treatment with nivolumab  480 Mg IV every 4 weeks.  Status post 30 cycles.  CURRENT THERAPY: Cabometyx  40 mg p.o. daily started on October 22, 2023  INTERVAL HISTORY: Bob Ewing 53 y.o. male returns to the clinic today for 39-month follow-up visit.  Discussed the use of AI scribe software for clinical note transcription with the patient, who gave verbal consent to proceed.  History of Present Illness   Bob Ewing is a 53 year old male with stage four clear cell renal cell carcinoma who presents for evaluation and repeat blood work.  He was diagnosed with stage four clear cell renal cell carcinoma of the left kidney with rhabdoid features in February 2022, with pulmonary involvement and mediastinal lymphadenopathy. He has been treated with immunotherapy, specifically ipilimumab  and nivolumab , followed by maintenance nivolumab  for two years. He began treatment with cabozantinib  40 mg PO daily on October 22, 2023 after prior therapies.  He experiences pain in the left flank area, close to the spine and between the ribs and the spine. This pain was initially controlled with immunotherapy but has returned and intensified, especially after a brief discontinuation of cabozantinib  for three to four days due to high blood pressure  concerns. The pain has been severe enough to prevent him from sleeping for three days.  His blood pressure was high, reaching 180 mmHg, which led to a temporary cessation of cabozantinib . He is currently on blood pressure medication, which was recently changed by another doctor, but the specific medication was not mentioned. He has a history of using atenolol  and hydrochlorothiazide .  He is concerned about the potential growth of the tumor in his left kidney and the impact of his condition on his ability to work, as he is trying to balance his stage four cancer diagnosis with his work Counselling psychologist. He seeks medication to help with sleep and pain management that would not impair his ability to drive, as he works as a [employer] architect.  He has Norco available but has not used it due to concerns about dependency and its impact on his ability to drive. He is considering using it during his mandatory ten-hour breaks if necessary.        MEDICAL HISTORY: Past Medical History:  Diagnosis Date   Cancer (HCC)    Hypertension    Stroke (HCC)     ALLERGIES:  has no active allergies.  MEDICATIONS:  Current Outpatient Medications  Medication Sig Dispense Refill   amoxicillin (AMOXIL) 500 MG capsule Take 500 mg by mouth every 8 (eight) hours.     atenolol  (TENORMIN ) 50 MG tablet Take 1 tablet (50 mg total) by mouth daily. 90 tablet 3   cabozantinib  (CABOMETYX ) 40 MG tablet Take 1 tablet (40 mg total) by mouth daily. Take on an empty stomach, 1 hour before or 2 hours after meals. 30 tablet 2  hydrochlorothiazide  (HYDRODIURIL ) 25 MG tablet Take 1 tablet (25 mg total) by mouth daily. 90 tablet 0   HYDROcodone -acetaminophen  (NORCO) 10-325 MG tablet Take 1 tablet by mouth every 4 (four) hours as needed for moderate pain or severe pain.     levothyroxine  (SYNTHROID ) 150 MCG tablet Take 1 tablet (150 mcg total) by mouth daily. 30 tablet 1   metFORMIN (GLUCOPHAGE) 500 MG tablet TAKE 1 TABLET BY MOUTH DAILY  90 tablet 1   No current facility-administered medications for this visit.    SURGICAL HISTORY: No past surgical history on file.  REVIEW OF SYSTEMS:  Constitutional: positive for fatigue Eyes: negative Ears, nose, mouth, throat, and face: negative Respiratory: negative Cardiovascular: negative Gastrointestinal: negative Genitourinary:negative Integument/breast: negative Hematologic/lymphatic: negative Musculoskeletal:positive for back pain Neurological: negative Behavioral/Psych: negative Endocrine: negative Allergic/Immunologic: negative   PHYSICAL EXAMINATION: General appearance: alert, cooperative, fatigued, and no distress Head: Normocephalic, without obvious abnormality, atraumatic Neck: no adenopathy, no JVD, supple, symmetrical, trachea midline, and thyroid  not enlarged, symmetric, no tenderness/mass/nodules Lymph nodes: Cervical, supraclavicular, and axillary nodes normal. Resp: clear to auscultation bilaterally Back: symmetric, no curvature. ROM normal. No CVA tenderness. Cardio: regular rate and rhythm, S1, S2 normal, no murmur, click, rub or gallop GI: soft, non-tender; bowel sounds normal; no masses,  no organomegaly Extremities: extremities normal, atraumatic, no cyanosis or edema Neurologic: Alert and oriented X 3, normal strength and tone. Normal symmetric reflexes. Normal coordination and gait  ECOG PERFORMANCE STATUS: 1 - Symptomatic but completely ambulatory  Blood pressure (!) 160/96, pulse 77, temperature 97.6 F (36.4 C), temperature source Temporal, resp. rate 17, weight 222 lb 9.6 oz (101 kg), SpO2 99%.  LABORATORY DATA: Lab Results  Component Value Date   WBC 6.4 11/30/2023   HGB 15.1 11/30/2023   HCT 43.5 11/30/2023   MCV 89.7 11/30/2023   PLT 246 11/30/2023      Chemistry      Component Value Date/Time   NA 139 11/30/2023 1505   K 4.0 11/30/2023 1505   CL 101 11/30/2023 1505   CO2 33 (H) 11/30/2023 1505   BUN 16 11/30/2023 1505    CREATININE 1.27 (H) 11/30/2023 1505      Component Value Date/Time   CALCIUM 9.2 11/30/2023 1505   ALKPHOS 84 11/30/2023 1505   AST 28 11/30/2023 1505   ALT 58 (H) 11/30/2023 1505   BILITOT 0.3 11/30/2023 1505       RADIOGRAPHIC STUDIES: No results found.   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  The patient started induction treatment with immunotherapy with ipilimumab  1 Mg/KG and nivolumab  3 mg/KG every 3 weeks for 4 cycles.  First dose was given on October 02, 2020.  He is currently undergoing maintenance treatment with nivolumab  480 Mg IV every 4 weeks.  Status post 30 cycles. The patient is currently on observation and he is feeling fine with no concerning complaints except for the left flank pain. He had evidence for disease progression on the last imaging studies and the patient started treatment with Cabometyx  40 mg p.o. daily on October 22, 2023 status post 4 weeks of treatment. He has been tolerating this treatment well with no concerning adverse effect except for fatigue.  Stage 4 clear cell renal cell carcinoma Stage 4 clear cell renal cell carcinoma of the left kidney with rhabdoid features, diagnosed in February 2022 with pulmonary involvement and mediastinal lymphadenopathy. Previously treated with immunotherapy (ablimumab and nivolumab ) followed by maintenance nivolumab  for two years. Currently on Cabometyx  (cabozantinib ) 40 mg PO daily since October 22, 2023. Reports new onset pain in the left flank area, possibly indicating disease progression. Concerns about tumor growth in the left kidney. Discussed the possibility of performing a scan  sooner than planned due to increased pain. - Order scan in three weeks to assess for disease progression. - Consider earlier scan if pain worsens significantly.  Pain in left flank area Severe pain in the left flank area affecting sleep, located close to the spine and between the ribs and spine, possibly related to renal cell carcinoma. Discussed pain management options, including Tylenol  and Norco. He is concerned about the impact of pain medication on driving ability due to occupation as a [employer] architect. - Use Tylenol  as needed for pain management. - Prescribe Norco for severe pain, with instructions to take during 10-hour breaks from driving. - Advise against driving after taking Norco.  Hypertension Hypertension with recent blood pressure reaching 180 mmHg. Blood pressure decreased after temporarily discontinuing Cabometyx  for three to four days. Currently on a new antihypertensive medication prescribed by another provider, details of which are not specified. - Continue current antihypertensive medication as prescribed by primary care provider.   The patient was advised to immediately if he has any other concerning symptoms in the interval. The patient voices understanding of current disease status and treatment options and is in agreement with the current care plan.  All questions were answered. The patient knows to call the clinic with any problems, questions or concerns. We can certainly see the patient much sooner if necessary. The total time spent in the appointment was 30 minutes.  Disclaimer: This note was dictated with voice recognition software. Similar sounding words can inadvertently be transcribed and may not be corrected upon review.

## 2024-01-04 ENCOUNTER — Telehealth: Payer: Self-pay

## 2024-01-04 NOTE — Telephone Encounter (Signed)
 Received voicemail from patient regarding high blood pressure and pain in left flank side.  Spoke with patient who reports that his blood pressure remains elevated. He states he has a new PCP from [facility] in [place], replacing Dr. Bernida Brink, but does not recall the PCP's name. The new PCP started him on metoprolol 100 mg twice daily and hydrochlorothiazide  25 mg once daily. Patient monitors his blood pressure regularly at home. He has an appointment with the PCP on 02/22/24.  Patient reports pain in the left flank side that disrupts his sleep. He rates the pain as 6/10, stating it irritates him enough at night to prevent sleep, though he feels okay during the day. Patient requested a prescription for Norco for pain.  Informed patient that I will relay this information to Dr. Marguerita Shih for review and will follow up with recommendations. Patient acknowledged and voiced understanding.

## 2024-01-05 NOTE — Telephone Encounter (Signed)
 Spoke with patient this morning regarding Dr. Bing Buff recommendations. Per Dr. Marguerita Shih, the patient may continue current blood pressure medications and re-consult with PCP as needed. Patient is scheduled for a CT scan on 01/16/24 to evaluate ongoing flank pain. Patient was informed that if the pain worsens, the scan may be rescheduled for an earlier date or the patient should seek evaluation in the ED. Patient stated he is currently on the road for work and is unable to obtain the scan sooner. Reiterated the importance of ED evaluation if symptoms worsen. Patient is currently taking Tylenol  for pain management. Patient verbalized understanding of the plan.

## 2024-01-16 ENCOUNTER — Other Ambulatory Visit (HOSPITAL_COMMUNITY): Payer: Self-pay

## 2024-01-16 ENCOUNTER — Inpatient Hospital Stay: Attending: Oncology

## 2024-01-16 ENCOUNTER — Ambulatory Visit (HOSPITAL_COMMUNITY)
Admission: RE | Admit: 2024-01-16 | Discharge: 2024-01-16 | Disposition: A | Discharge status: Home / Self Care | Source: Ambulatory Visit | Attending: Internal Medicine | Admitting: Internal Medicine

## 2024-01-16 ENCOUNTER — Encounter (HOSPITAL_COMMUNITY): Payer: Self-pay

## 2024-01-16 DIAGNOSIS — C642 Malignant neoplasm of left kidney, except renal pelvis: Secondary | ICD-10-CM | POA: Diagnosis present

## 2024-01-16 LAB — CBC WITH DIFFERENTIAL (CANCER CENTER ONLY)
Abs Immature Granulocytes: 0.02 10*3/uL (ref 0.00–0.07)
Basophils Absolute: 0 10*3/uL (ref 0.0–0.1)
Basophils Relative: 0 %
Eosinophils Absolute: 0.1 10*3/uL (ref 0.0–0.5)
Eosinophils Relative: 1 %
HCT: 39.4 % (ref 39.0–52.0)
Hemoglobin: 14.3 g/dL (ref 13.0–17.0)
Immature Granulocytes: 0 %
Lymphocytes Relative: 18 %
Lymphs Abs: 1.2 10*3/uL (ref 0.7–4.0)
MCH: 31.8 pg (ref 26.0–34.0)
MCHC: 36.3 g/dL — ABNORMAL HIGH (ref 30.0–36.0)
MCV: 87.8 fL (ref 80.0–100.0)
Monocytes Absolute: 0.5 10*3/uL (ref 0.1–1.0)
Monocytes Relative: 8 %
Neutro Abs: 4.5 10*3/uL (ref 1.7–7.7)
Neutrophils Relative %: 73 %
Platelet Count: 317 10*3/uL (ref 150–400)
RBC: 4.49 MIL/uL (ref 4.22–5.81)
RDW: 15 % (ref 11.5–15.5)
WBC Count: 6.3 10*3/uL (ref 4.0–10.5)
nRBC: 0 % (ref 0.0–0.2)

## 2024-01-16 LAB — CMP (CANCER CENTER ONLY)
ALT: 32 U/L (ref 0–44)
AST: 19 U/L (ref 15–41)
Albumin: 4 g/dL (ref 3.5–5.0)
Alkaline Phosphatase: 79 U/L (ref 38–126)
Anion gap: 8 (ref 5–15)
BUN: 11 mg/dL (ref 6–20)
CO2: 26 mmol/L (ref 22–32)
Calcium: 9.1 mg/dL (ref 8.9–10.3)
Chloride: 94 mmol/L — ABNORMAL LOW (ref 98–111)
Creatinine: 0.81 mg/dL (ref 0.61–1.24)
GFR, Estimated: >60 mL/min (ref >60)
Glucose, Bld: 85 mg/dL (ref 70–99)
Potassium: 3.8 mmol/L (ref 3.5–5.1)
Sodium: 128 mmol/L — ABNORMAL LOW (ref 135–145)
Total Bilirubin: 0.3 mg/dL (ref 0.0–1.2)
Total Protein: 7.3 g/dL (ref 6.5–8.1)

## 2024-01-17 ENCOUNTER — Other Ambulatory Visit: Payer: Self-pay

## 2024-01-19 ENCOUNTER — Other Ambulatory Visit: Payer: Self-pay | Admitting: Internal Medicine

## 2024-01-19 ENCOUNTER — Other Ambulatory Visit: Payer: Self-pay

## 2024-01-19 MED ORDER — CABOMETYX 40 MG PO TABS
40.0000 mg | ORAL_TABLET | Freq: Every day | ORAL | 2 refills | Status: DC
  Filled 2024-01-19 – 2024-01-22 (×3): qty 30, 30d supply, fill #0

## 2024-01-22 ENCOUNTER — Other Ambulatory Visit: Payer: Self-pay | Admitting: Pharmacy Technician

## 2024-01-22 ENCOUNTER — Other Ambulatory Visit: Payer: Self-pay

## 2024-01-22 ENCOUNTER — Telehealth: Payer: Self-pay | Admitting: Medical Oncology

## 2024-01-22 NOTE — Telephone Encounter (Signed)
 He is a [employer] and he cannot do an in person visit as scheduled for tomorrow . He can do a videovisit. Appt changed to videovisit.  I called radiology to have his scan report resulted by his appt time.

## 2024-01-22 NOTE — Progress Notes (Signed)
 Specialty Pharmacy Refill Coordination Note  Bob Ewing is a 53 y.o. male contacted today regarding refills of specialty medication(s) Cabozantinib  S-Malate (Cabometyx )   Patient requested [name] at [facility] at [place] date: 01/24/24   Medication will be filled on 01/23/24.

## 2024-01-23 ENCOUNTER — Other Ambulatory Visit: Payer: Self-pay

## 2024-01-23 ENCOUNTER — Inpatient Hospital Stay (HOSPITAL_BASED_OUTPATIENT_CLINIC_OR_DEPARTMENT_OTHER): Admitting: Internal Medicine

## 2024-01-23 ENCOUNTER — Other Ambulatory Visit (HOSPITAL_COMMUNITY): Payer: Self-pay

## 2024-01-23 DIAGNOSIS — C7972 Secondary malignant neoplasm of left adrenal gland: Secondary | ICD-10-CM | POA: Diagnosis not present

## 2024-01-23 DIAGNOSIS — G893 Neoplasm related pain (acute) (chronic): Secondary | ICD-10-CM | POA: Insufficient documentation

## 2024-01-23 DIAGNOSIS — C7902 Secondary malignant neoplasm of left kidney and renal pelvis: Secondary | ICD-10-CM | POA: Diagnosis not present

## 2024-01-23 DIAGNOSIS — Z9221 Personal history of antineoplastic chemotherapy: Secondary | ICD-10-CM | POA: Insufficient documentation

## 2024-01-23 DIAGNOSIS — C642 Malignant neoplasm of left kidney, except renal pelvis: Secondary | ICD-10-CM

## 2024-01-23 DIAGNOSIS — J90 Pleural effusion, not elsewhere classified: Secondary | ICD-10-CM | POA: Insufficient documentation

## 2024-01-23 MED ORDER — HYDROCODONE-ACETAMINOPHEN 5-325 MG PO TABS: 1.0000 | ORAL_TABLET | Freq: Four times a day (QID) | ORAL | 0 refills | Status: DC | PRN

## 2024-01-23 MED ORDER — LENVATINIB (20 MG DAILY DOSE) 2 X 10 MG PO CPPK
20.0000 mg | ORAL_CAPSULE | Freq: Every day | ORAL | 11 refills | Status: DC
  Filled 2024-01-25: qty 60, 30d supply, fill #0
  Filled 2024-02-22: qty 60, 30d supply, fill #1
  Filled 2024-03-25: qty 60, 30d supply, fill #2

## 2024-01-23 NOTE — Progress Notes (Signed)
 [facility] [phone]   [phone]  OFFICE PROGRESS NOTE  [name], MD 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  PRIOR THERAPY:  1) Status post renal biopsy on September 14, 2020 confirming the presence of clear-cell renal cell carcinoma with rhabdoid features. 2) status post induction treatment with immunotherapy with ipilimumab  1 Mg/KG and nivolumab  3 mg/KG every 3 weeks for 4 cycles.  First dose was given on October 02, 2020. 3) Maintenance treatment with nivolumab  480 Mg IV every 4 weeks.  Status post 30 cycles. 3) Cabometyx  40 mg p.o. daily started on October 22, 2023.  This was discontinued after 3 months secondary to disease progression.  CURRENT THERAPY: Systemic therapy with pembrolizumab 200 Mg IV every 3 weeks in addition to lenvatinib orally.  First dose January 29, 2024.  INTERVAL HISTORY: Bob Ewing 53 y.o. male returns to the clinic today for 37-month follow-up visit.  Discussed the use of AI scribe software for clinical note transcription with the patient, who gave verbal consent to proceed.  History of Present Illness   Bob Ewing is a 53 year old male with stage four left renal cell carcinoma who presents with increased pain and sleep disturbances.  He has a history of stage four left renal cell carcinoma with pulmonary involvement and mediastinal lymphadenopathy, diagnosed in February 2022. He completed two years of treatment with immunotherapy using ipilimumab  and nivolumab , and has been on Cabometyx  since March 2025.  He experiences increased pain since his last visit, initially moderate but worsening over time. He has been unable to sleep for the past two to three weeks despite using medications such as Tylenol  and ibuprofen, which have not  been effective. Previously prescribed hydrocodone  provided temporary relief but is no longer effective, lasting only two to three hours. He is reluctant to use hydrocodone  frequently due to a dislike of using it and prefers to be patient with pain medications.  His pain is more pronounced at night, affecting his ability to sleep, while he feels better during the day. He has not experienced any adverse effects from the pain medication, and it allows him to live normally without pain when effective.  He reports a decreased appetite and difficulty eating well, which he attributes to his current condition. 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       MEDICAL HISTORY: Past Medical History:  Diagnosis Date   Cancer (HCC)    Hypertension    Stroke (HCC)     ALLERGIES:  has no active allergies.  MEDICATIONS:  Current Outpatient Medications  Medication Sig Dispense Refill   cabozantinib  (CABOMETYX ) 40 MG tablet Take 1 tablet (40 mg total) by mouth daily. Take on an empty stomach, 1 hour before or 2 hours after meals. 30 tablet 2   cyclobenzaprine (FLEXERIL) 10 MG tablet Take 10 mg by mouth at bedtime as needed.     hydrochlorothiazide  (HYDRODIURIL ) 25 MG tablet Take 1 tablet (25 mg total) by mouth daily. 90 tablet 0   ibuprofen (ADVIL) 800 MG tablet Take 800 mg by mouth every 6 (six) hours as needed.     levothyroxine  (SYNTHROID ) 150 MCG tablet Take 1 tablet (150 mcg total) by mouth daily. 30 tablet 1  metFORMIN (GLUCOPHAGE) 500 MG tablet TAKE 1 TABLET BY MOUTH DAILY 90 tablet 1   metoprolol tartrate (LOPRESSOR) 100 MG tablet Take 100 mg by mouth 2 (two) times daily.     No current facility-administered medications for this visit.    SURGICAL HISTORY: No past surgical history on file.  REVIEW OF SYSTEMS:  Constitutional: positive for anorexia, fatigue, and weight loss Eyes: negative Ears, nose,  mouth, throat, and face: negative Respiratory: negative Cardiovascular: negative Gastrointestinal: negative Genitourinary:negative Integument/breast: negative Hematologic/lymphatic: negative Musculoskeletal:positive for back pain Neurological: negative Behavioral/Psych: negative Endocrine: negative Allergic/Immunologic: negative   LABORATORY DATA: Lab Results  Component Value Date   WBC 6.3 01/16/2024   HGB 14.3 01/16/2024   HCT 39.4 01/16/2024   MCV 87.8 01/16/2024   PLT 317 01/16/2024      Chemistry      Component Value Date/Time   NA 128 (L) 01/16/2024 1359   K 3.8 01/16/2024 1359   CL 94 (L) 01/16/2024 1359   CO2 26 01/16/2024 1359   BUN 11 01/16/2024 1359   CREATININE 0.81 01/16/2024 1359      Component Value Date/Time   CALCIUM 9.1 01/16/2024 1359   ALKPHOS 79 01/16/2024 1359   AST 19 01/16/2024 1359   ALT 32 01/16/2024 1359   BILITOT 0.3 01/16/2024 1359       RADIOGRAPHIC STUDIES: CT Chest Wo Contrast Result Date: 01/22/2024 CLINICAL DATA:  Kidney cancer staging. EXAM: CT CHEST, ABDOMEN AND PELVIS WITHOUT CONTRAST TECHNIQUE: Multidetector CT imaging of the chest, abdomen and pelvis was performed following the standard protocol without IV contrast. RADIATION DOSE REDUCTION: This exam was performed according to the departmental dose-optimization program which includes automated exposure control, adjustment of the mA and/or kV according to patient size and/or use of iterative reconstruction technique. COMPARISON:  CT dated 10/02/2023. FINDINGS: Evaluation of this exam is limited in the absence of intravenous contrast. CT CHEST FINDINGS Cardiovascular: There is no cardiomegaly or pericardial effusion. The thoracic aorta and the central pulmonary arteries are unremarkable on this noncontrast CT. Mediastinum/Nodes: Mildly enlarged right hilar lymph node measures 15 mm in short axis (24/2), new or increased since the prior CT. Additional nodular structure in the left  hilum measures 13 mm (26/2) and slightly progressed since the prior CT. The esophagus is grossly unremarkable. No mediastinal fluid collection. Lungs/Pleura: Interval development of a small left pleural effusion with partial compressive atelectasis of the left lower lobe. A 4 mm left lower lobe nodule (58/4) appears new since the prior CT. Several additional small nodules throughout the lungs measure up to 4 mm in the right apex (30/4) also new since the prior CT. No pneumothorax. The central airways are patent. Musculoskeletal: Similar appearance of expansile lesion in the posterior right ninth rib no acute osseous pathology. CT ABDOMEN PELVIS FINDINGS No intra-abdominal free air or free fluid. Hepatobiliary: Fatty liver. No biliary dilatation. The gallbladder is unremarkable Pancreas: Unremarkable. No pancreatic ductal dilatation or surrounding inflammatory changes. Spleen: Normal in size without focal abnormality. Adrenals/Urinary Tract: The right adrenal gland is unremarkable. Nodular thickening of the left adrenal gland new since the prior CT suspicious for metastatic disease. Interval increase in the size of left renal mass measuring approximately 11 x 8 cm in greatest axial dimensions (previously 10 x 7 cm). There is moderate left hydronephrosis secondary to obstruction of the left renal pelvis. There is apparent extension of the mass into the retroperitoneum with loss of fat plane between the renal mass and abdominal aorta as well as  extension into the left psoas muscle. Interval increase in the nodular extrarenal masses or adenopathy in the left renal hilum as well as progression of retroperitoneal adenopathy since the prior CT. Evaluation of possible extension into the renal vein is limited in the absence of intravenous contrast. There is no hydronephrosis or nephrolithiasis on the right. The urinary bladder is grossly unremarkable. Stomach/Bowel: There is no bowel obstruction or active inflammation. The  appendix is normal. Vascular/Lymphatic: The abdominal aorta and IVC are grossly unremarkable on this noncontrast CT. No portal venous gas. Progression of retroperitoneal adenopathy since the prior CT. Reproductive: The prostate is grossly unremarkable. The seminal vesicles are symmetric. Other: Stranding of the peritoneal reflection along the paracolic gutters. Increase in the size of peripancreatic lymph nodes measuring 8 mm in short axis (56/2). Musculoskeletal: No acute osseous pathology. No suspicious bone lesions. IMPRESSION: 1. Overall progression of disease since the prior CT. 2. Increase in the size of the left renal mass and extrarenal extension as well as progression of retroperitoneal adenopathy. 3. New nodular thickening of the left adrenal gland suspicious for metastatic disease. 4. Interval development of a small left pleural effusion and new small pulmonary nodules suspicious for metastatic disease. 5. Fatty liver. Electronically Signed   By: Vanetta Chou M.D.   On: 01/22/2024 18:08   CT ABDOMEN PELVIS WO CONTRAST Result Date: 01/22/2024 CLINICAL DATA:  Kidney cancer staging. EXAM: CT CHEST, ABDOMEN AND PELVIS WITHOUT CONTRAST TECHNIQUE: Multidetector CT imaging of the chest, abdomen and pelvis was performed following the standard protocol without IV contrast. RADIATION DOSE REDUCTION: This exam was performed according to the departmental dose-optimization program which includes automated exposure control, adjustment of the mA and/or kV according to patient size and/or use of iterative reconstruction technique. COMPARISON:  CT dated 10/02/2023. FINDINGS: Evaluation of this exam is limited in the absence of intravenous contrast. CT CHEST FINDINGS Cardiovascular: There is no cardiomegaly or pericardial effusion. The thoracic aorta and the central pulmonary arteries are unremarkable on this noncontrast CT. Mediastinum/Nodes: Mildly enlarged right hilar lymph node measures 15 mm in short axis  (24/2), new or increased since the prior CT. Additional nodular structure in the left hilum measures 13 mm (26/2) and slightly progressed since the prior CT. The esophagus is grossly unremarkable. No mediastinal fluid collection. Lungs/Pleura: Interval development of a small left pleural effusion with partial compressive atelectasis of the left lower lobe. A 4 mm left lower lobe nodule (58/4) appears new since the prior CT. Several additional small nodules throughout the lungs measure up to 4 mm in the right apex (30/4) also new since the prior CT. No pneumothorax. The central airways are patent. Musculoskeletal: Similar appearance of expansile lesion in the posterior right ninth rib no acute osseous pathology. CT ABDOMEN PELVIS FINDINGS No intra-abdominal free air or free fluid. Hepatobiliary: Fatty liver. No biliary dilatation. The gallbladder is unremarkable Pancreas: Unremarkable. No pancreatic ductal dilatation or surrounding inflammatory changes. Spleen: Normal in size without focal abnormality. Adrenals/Urinary Tract: The right adrenal gland is unremarkable. Nodular thickening of the left adrenal gland new since the prior CT suspicious for metastatic disease. Interval increase in the size of left renal mass measuring approximately 11 x 8 cm in greatest axial dimensions (previously 10 x 7 cm). There is moderate left hydronephrosis secondary to obstruction of the left renal pelvis. There is apparent extension of the mass into the retroperitoneum with loss of fat plane between the renal mass and abdominal aorta as well as extension into the left psoas  muscle. Interval increase in the nodular extrarenal masses or adenopathy in the left renal hilum as well as progression of retroperitoneal adenopathy since the prior CT. Evaluation of possible extension into the renal vein is limited in the absence of intravenous contrast. There is no hydronephrosis or nephrolithiasis on the right. The urinary bladder is grossly  unremarkable. Stomach/Bowel: There is no bowel obstruction or active inflammation. The appendix is normal. Vascular/Lymphatic: The abdominal aorta and IVC are grossly unremarkable on this noncontrast CT. No portal venous gas. Progression of retroperitoneal adenopathy since the prior CT. Reproductive: The prostate is grossly unremarkable. The seminal vesicles are symmetric. Other: Stranding of the peritoneal reflection along the paracolic gutters. Increase in the size of peripancreatic lymph nodes measuring 8 mm in short axis (56/2). Musculoskeletal: No acute osseous pathology. No suspicious bone lesions. IMPRESSION: 1. Overall progression of disease since the prior CT. 2. Increase in the size of the left renal mass and extrarenal extension as well as progression of retroperitoneal adenopathy. 3. New nodular thickening of the left adrenal gland suspicious for metastatic disease. 4. Interval development of a small left pleural effusion and new small pulmonary nodules suspicious for metastatic disease. 5. Fatty liver. Electronically Signed   By: Vanetta Chou M.D.   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  The patient started induction treatment with immunotherapy with ipilimumab  1 Mg/KG and nivolumab  3 mg/KG every 3 weeks for 4 cycles.  First dose was given on October 02, 2020.  He is currently undergoing maintenance treatment with nivolumab  480 Mg IV every 4 weeks.  Status post 30 cycles. The patient is currently on observation and he is feeling fine with no concerning complaints except for the left flank pain. He had evidence for disease progression  on the last imaging studies and the patient started treatment with Cabometyx  40 mg p.o. daily on October 22, 2023 status post 4 weeks of treatment. He had repeat CT scan of the chest, abdomen and pelvis performed recently.  I personally independently reviewed the scan images and discussed the result with the patient today.  Unfortunately his scan showed evidence for disease progression with increase in the size of the left renal mass with extrarenal extension as well as progression of retroperitoneal adenopathy and new nodular thickening of the left adrenal gland suspicious for metastatic disease.  There was also development of a small left pleural effusion. I had a lengthy discussion with the patient today about his current condition and treatment options. Assessment and Plan    Stage 4 renal cell carcinoma with metastasis Progression of stage 4 renal cell carcinoma with metastasis to the left kidney, left adrenal gland, and pleural effusion around the left lung. Current treatment with Cabometyx  is ineffective. Transition to Keytruda and Lenvatinib is necessary due to cancer progression. Continuing treatment is crucial for disease management and maintaining work capability, which is important for insurance coverage. - Discontinue Cabometyx . - Initiate Keytruda and Lenvatinib post-trip. - Arrange office visit to discuss new treatment plan.  Chronic pain due to cancer Chronic pain has worsened, particularly nocturnally, affecting sleep. Hydrocodone  is ineffective but causes no adverse effects. Effective pain management is essential  for quality of life and work capability. - Prescribe new pain medication to [facility] in [place]. - Ensure access to pain medication while out of town.   He was advised to call immediately if he has any concerning symptoms in the interval. The patient voices understanding of current disease status and treatment options and is in agreement with the current care plan.  All  questions were answered. The patient knows to call the clinic with any problems, questions or concerns. We can certainly see the patient much sooner if necessary. The total time spent in the appointment was 30 minutes.  Disclaimer: This note was dictated with voice recognition software. Similar sounding words can inadvertently be transcribed and may not be corrected upon review.

## 2024-01-23 NOTE — Progress Notes (Signed)
 DISCONTINUE ON PATHWAY REGIMEN - Renal Cell     A cycle is every 28 days:     Cabozantinib  (tablet)   **Always confirm dose/schedule in your pharmacy ordering system**  PRIOR TREATMENT: [id number]: Cabozantinib  (tablet) 60 mg Daily  START OFF PATHWAY REGIMEN - Renal Cell   [id number]:Lenvatinib 20 mg PO Daily D1-42 + Pembrolizumab 400 mg IV D1 q42 Days:   A cycle is every 42 days:     Lenvatinib      Pembrolizumab   **Always confirm dose/schedule in your pharmacy ordering system**  Patient Characteristics: Stage IV (Unresected T4M0 or Any T, M1)/Metastatic Disease, Clear Cell, First Line, Intermediate or Poor Risk Therapeutic Status: Stage IV (Unresected T4M0 or Any T, M1)/Metastatic Disease Histology: Clear Cell Line of Therapy: First Line Risk Status: Intermediate Risk Intent of Therapy: Non-Curative / Palliative Intent, Discussed with Patient

## 2024-01-24 ENCOUNTER — Other Ambulatory Visit (HOSPITAL_COMMUNITY): Payer: Self-pay

## 2024-01-24 ENCOUNTER — Telehealth: Payer: Self-pay | Admitting: Pharmacist

## 2024-01-24 ENCOUNTER — Telehealth: Payer: Self-pay | Admitting: Pharmacy Technician

## 2024-01-24 NOTE — Telephone Encounter (Signed)
 Oral Oncology Patient Advocate Encounter   Received notification that prior authorization for Lenvima is required.   PA submitted on 01/24/2024 to [facility] verbally. Case ID #: [id number] Status is pending     Bob Ewing (Patty) [name], CPhT  [facility], [place], [name], [place] Oral Chemotherapy Patient Advocate Phone: [phone]  Fax: [phone]

## 2024-01-24 NOTE — Telephone Encounter (Signed)
 Oral Oncology Pharmacist Encounter  Received new prescription for Lenvima (lenvatinib) for the treatment of metastatic clear-cell RCC in conjunction with pembrolizumab, planned duration until disease progression or unacceptable drug toxicity.  CBC w/ Diff and CMP from 01/16/24 assessed, no relevant lab abnormalities requiring baseline dose adjustment required at this time. Prescription dose and frequency assessed for appropriateness.  Current medication list in Epic reviewed, no relevant/significant DDIs with Lenvima identified.  Evaluated chart and no patient barriers to medication adherence noted.   Patient agreement for treatment documented in treatment plan on 01/23/24.  Prescription has been e-scribed to the [facility] for benefits analysis and approval.  Oral Oncology Clinic will continue to follow for insurance authorization, copayment issues, initial counseling and start date.  Bob Ewing, PharmD, BCPS, BCOP Hematology/Oncology Clinical Pharmacist [facility] and [facility] Oral Chemotherapy Navigation Clinics [phone] 01/24/2024 8:33 AM

## 2024-01-24 NOTE — Telephone Encounter (Signed)
 Oral Oncology Patient Advocate Encounter  Prior Authorization for Lenvima has been approved.    PA# [id number] Effective dates: 01/10/2024 through 01/23/2025  Patients co-pay is $0.    [place] (Patty) [name], CPhT  [facility], [place], [name], [place] Oral Chemotherapy Patient Advocate Phone: [phone]  Fax: [phone]

## 2024-01-25 ENCOUNTER — Other Ambulatory Visit: Payer: Self-pay

## 2024-01-25 ENCOUNTER — Other Ambulatory Visit: Payer: Self-pay | Admitting: Pharmacy Technician

## 2024-01-25 ENCOUNTER — Other Ambulatory Visit (HOSPITAL_COMMUNITY): Payer: Self-pay

## 2024-01-25 NOTE — Telephone Encounter (Addendum)
 Oral Chemotherapy Pharmacist Encounter  I spoke with patient for overview of: Lenvima (lenvatinib) for the treatment of metastatic clear-cell RCC in conjunction with pembrolizumab, planned duration until disease progression or unacceptable drug toxicity.  Counseled patient on administration, dosing, side effects, monitoring, drug-food interactions, safe handling, storage, and disposal.  Patient will take Lenvima 10mg  capsules, 2 capsules (20mg ) by mouth once daily, with or without food, at approximately the same time each day.  Lenvima start date: 01/29/24   Adverse effects include but are not limited to: hypertension, hand-foot syndrome, diarrhea, fatigue, headache, change in electrolytes, and cardiac conduction issues.   Diarrhea: Patient will obtain anti diarrheal and alert the office of 4 or more loose stools above baseline. Nausea/Vomiting: will see if MD can send in Rx for PRN compazine  for patient to have on hand. Hand-foot syndrome: patient will make sure he keeps his hands and feet moisturized while on Lenvima. Recommend considering use of udderly smooth extra care 20 while on Lenvima.  HTN: Patient with known history of HTN (160/96 mmHg when patient was in clinic on 12/25/23) - this will need to be continued to be monitored while patient is on therapy as Lenvima can exacerbate HTN.  Patient instructed to notify office of any upcoming invasive procedures. Lenvima will be held for 6 days prior to scheduled surgery, restart based on healing and clinical judgement.   Reviewed with patient importance of keeping a medication schedule and plan for any missed doses. No barriers to medication adherence identified.  Medication reconciliation performed and medication/allergy list updated.  All questions answered.  [name] voiced understanding and appreciation.   Medication education handout placed in mail for patient. Patient knows to call the office with questions or concerns. Oral  Chemotherapy Clinic phone number provided to patient.   Bob Ewing, PharmD, BCPS, BCOP Hematology/Oncology Clinical Pharmacist [facility] and [facility] Oral Chemotherapy Navigation Clinics [phone] 01/25/2024 9:18 AM

## 2024-01-25 NOTE — Progress Notes (Signed)
 Specialty Pharmacy Initial Fill Coordination Note  Bob Ewing is a 53 y.o. male contacted today regarding refills of specialty medication(s) Lenvatinib Mesylate (LENVIMA) .  Patient requested [name] at [facility] at [place]  on 01/29/24   Medication will be filled on 01/26/24.   Patient is aware of $0 copayment.

## 2024-01-25 NOTE — Progress Notes (Signed)
 Oral Chemotherapy Pharmacist Encounter  Patient was counseled under telephone encounter from 01/24/24.  Bob Ewing, PharmD, BCPS, BCOP Hematology/Oncology Clinical Pharmacist [facility] and [facility] Oral Chemotherapy Navigation Clinics [phone] 01/25/2024 9:29 AM

## 2024-01-29 ENCOUNTER — Other Ambulatory Visit (HOSPITAL_COMMUNITY): Payer: Self-pay

## 2024-01-29 ENCOUNTER — Telehealth: Payer: Self-pay | Admitting: Physician Assistant

## 2024-01-29 NOTE — Telephone Encounter (Signed)
 Left a voicemail with the scheduled appointment details.

## 2024-01-30 ENCOUNTER — Ambulatory Visit: Admitting: Physician Assistant

## 2024-01-30 ENCOUNTER — Other Ambulatory Visit: Payer: Self-pay

## 2024-01-30 ENCOUNTER — Inpatient Hospital Stay: Attending: Oncology

## 2024-01-30 VITALS — BP 153/91 | HR 88 | Temp 97.2°F | Resp 15 | Wt 205.6 lb

## 2024-01-30 DIAGNOSIS — C642 Malignant neoplasm of left kidney, except renal pelvis: Secondary | ICD-10-CM | POA: Insufficient documentation

## 2024-01-30 DIAGNOSIS — E871 Hypo-osmolality and hyponatremia: Secondary | ICD-10-CM | POA: Diagnosis not present

## 2024-01-30 DIAGNOSIS — R634 Abnormal weight loss: Secondary | ICD-10-CM | POA: Diagnosis not present

## 2024-01-30 DIAGNOSIS — R590 Localized enlarged lymph nodes: Secondary | ICD-10-CM | POA: Diagnosis not present

## 2024-01-30 DIAGNOSIS — C7972 Secondary malignant neoplasm of left adrenal gland: Secondary | ICD-10-CM | POA: Insufficient documentation

## 2024-01-30 DIAGNOSIS — R102 Pelvic and perineal pain: Secondary | ICD-10-CM | POA: Insufficient documentation

## 2024-01-30 DIAGNOSIS — G893 Neoplasm related pain (acute) (chronic): Secondary | ICD-10-CM

## 2024-01-30 DIAGNOSIS — R630 Anorexia: Secondary | ICD-10-CM | POA: Diagnosis not present

## 2024-01-30 DIAGNOSIS — C7800 Secondary malignant neoplasm of unspecified lung: Secondary | ICD-10-CM | POA: Diagnosis not present

## 2024-01-30 DIAGNOSIS — Z5112 Encounter for antineoplastic immunotherapy: Secondary | ICD-10-CM | POA: Insufficient documentation

## 2024-01-30 DIAGNOSIS — K5909 Other constipation: Secondary | ICD-10-CM | POA: Insufficient documentation

## 2024-01-30 DIAGNOSIS — J90 Pleural effusion, not elsewhere classified: Secondary | ICD-10-CM | POA: Diagnosis not present

## 2024-01-30 DIAGNOSIS — Z5986 Financial insecurity: Secondary | ICD-10-CM | POA: Insufficient documentation

## 2024-01-30 DIAGNOSIS — Z79620 Long term (current) use of immunosuppressive biologic: Secondary | ICD-10-CM | POA: Diagnosis not present

## 2024-01-30 DIAGNOSIS — F17210 Nicotine dependence, cigarettes, uncomplicated: Secondary | ICD-10-CM | POA: Insufficient documentation

## 2024-01-30 LAB — CBC WITH DIFFERENTIAL (CANCER CENTER ONLY)
Abs Immature Granulocytes: 0.02 10*3/uL (ref 0.00–0.07)
Basophils Absolute: 0 10*3/uL (ref 0.0–0.1)
Basophils Relative: 0 %
Eosinophils Absolute: 0 10*3/uL (ref 0.0–0.5)
Eosinophils Relative: 0 %
HCT: 36.1 % — ABNORMAL LOW (ref 39.0–52.0)
Hemoglobin: 13.2 g/dL (ref 13.0–17.0)
Immature Granulocytes: 0 %
Lymphocytes Relative: 10 %
Lymphs Abs: 0.5 10*3/uL — ABNORMAL LOW (ref 0.7–4.0)
MCH: 31.7 pg (ref 26.0–34.0)
MCHC: 36.6 g/dL — ABNORMAL HIGH (ref 30.0–36.0)
MCV: 86.6 fL (ref 80.0–100.0)
Monocytes Absolute: 0.4 10*3/uL (ref 0.1–1.0)
Monocytes Relative: 7 %
Neutro Abs: 4.1 10*3/uL (ref 1.7–7.7)
Neutrophils Relative %: 83 %
Platelet Count: 332 10*3/uL (ref 150–400)
RBC: 4.17 MIL/uL — ABNORMAL LOW (ref 4.22–5.81)
RDW: 14.2 % (ref 11.5–15.5)
WBC Count: 5 10*3/uL (ref 4.0–10.5)
nRBC: 0 % (ref 0.0–0.2)

## 2024-01-30 LAB — CMP (CANCER CENTER ONLY)
ALT: 27 U/L (ref 0–44)
AST: 22 U/L (ref 15–41)
Albumin: 4 g/dL (ref 3.5–5.0)
Alkaline Phosphatase: 99 U/L (ref 38–126)
Anion gap: 10 (ref 5–15)
BUN: 9 mg/dL (ref 6–20)
CO2: 28 mmol/L (ref 22–32)
Calcium: 9.7 mg/dL (ref 8.9–10.3)
Chloride: 84 mmol/L — ABNORMAL LOW (ref 98–111)
Creatinine: 0.9 mg/dL (ref 0.61–1.24)
GFR, Estimated: >60 mL/min (ref >60)
Glucose, Bld: 169 mg/dL — ABNORMAL HIGH (ref 70–99)
Potassium: 3.6 mmol/L (ref 3.5–5.1)
Sodium: 122 mmol/L — ABNORMAL LOW (ref 135–145)
Total Bilirubin: 0.6 mg/dL (ref 0.0–1.2)
Total Protein: 7.7 g/dL (ref 6.5–8.1)

## 2024-01-30 LAB — TSH: TSH: 5.62 u[IU]/mL — ABNORMAL HIGH (ref 0.350–4.500)

## 2024-01-30 MED ORDER — OXYCODONE-ACETAMINOPHEN 5-325 MG PO TABS: 1.0000 | ORAL_TABLET | Freq: Four times a day (QID) | ORAL | 0 refills | Status: DC | PRN

## 2024-01-30 MED ORDER — ONDANSETRON HCL 8 MG PO TABS: 8.0000 mg | ORAL_TABLET | Freq: Three times a day (TID) | ORAL | 2 refills | Status: DC | PRN

## 2024-01-30 MED ORDER — SODIUM CHLORIDE 1 G PO TABS: 1.0000 g | ORAL_TABLET | Freq: Three times a day (TID) | ORAL | 0 refills | Status: DC

## 2024-01-30 NOTE — Progress Notes (Signed)
 [facility] OFFICE PROGRESS NOTE  Bob Kettle, MD 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   PRIOR THERAPY: 1) Status post renal biopsy on September 14, 2020 confirming the presence of clear-cell renal cell carcinoma with rhabdoid features. 2) status post induction treatment with immunotherapy with ipilimumab  1 Mg/KG and nivolumab  3 mg/KG every 3 weeks for 4 cycles.  First dose was given on October 02, 2020. 3) Maintenance treatment with nivolumab  480 Mg IV every 4 weeks.  Status post 30 cycles. 3) Cabometyx  40 mg p.o. daily started on October 22, 2023.  This was discontinued after 3 months secondary to disease progression.  CURRENT THERAPY: Systemic therapy with pembrolizumab 200 Mg IV every 3 weeks in addition to lenvatinib  orally. First dose January 29, 2024.   INTERVAL HISTORY: Bob Ewing 53 y.o. male returns to the clinic today for a follow up visit. The patient was last seen in the clinic with Dr. Sherrod on 01/23/24. At that point as time, the patient was found to have disease progression. Therefore, Dr. Sherrod recommended initiating treatment with Keytruda and Lenvatinib . He received his lenvatinib  on 01/29/24 and started it yesterday.  He experiences significant pain primarily in the area of the left kidney, which has increased in size according to recent imaging. He uses hydrocodone  (Norco) for pain relief but finds it insufficient, stating it is 'not so stronger than Tylenol '. 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 He has been using expired medication, which he believes may have reduced efficacy. He did not pick up the prescription that Dr. Sherrod sent last week on 01/23/24. However,  this was 5 mg tablets and the patient tells me that he typically takes 10 mg tablets to notice any appreciable change. He is not interested in seeing palliative care at this time.   He has a history of smoking and acknowledges difficulty in quitting. He reports a lack of appetite, stating he 'can't finish one bite' and feels full easily. He avoids foods with oil due to nausea triggered by the smell. He has experienced significant unintentional weight loss. He does not have any anti-emetics at home. He is not interested in seeing nutrition.   He reports constipation but has not been using regular laxatives, only over-the-counter options occasionally. No diarrhea, rashes, or significant changes in breathing. No night sweats and significant cough, attributing any cough to smoking.  He has experienced low sodium levels, which he attributes to reduced food intake. He is here for evaluation and repeat blood work before undergoing cycle #1 of Keytruda which is scheduled for tomorrow.     MEDICAL HISTORY: Past Medical History:  Diagnosis Date   Cancer (HCC)    Hypertension    Stroke (HCC)     ALLERGIES:  has no active allergies.  MEDICATIONS:  Current Outpatient Medications  Medication Sig Dispense Refill   ondansetron  (ZOFRAN ) 8 MG tablet Take 1 tablet (8 mg total) by mouth every 8 (eight) hours as needed for nausea or vomiting. 30 tablet 2   oxyCODONE -acetaminophen  (PERCOCET/ROXICET) 5-325 MG tablet Take 1 tablet by mouth every 6 (six) hours as needed for severe pain (pain score 7-10). 30 tablet 0   sodium chloride  1 g tablet Take 1 tablet (1 g total)  by mouth 3 (three) times daily with meals. 60 tablet 0   cyclobenzaprine (FLEXERIL) 10 MG tablet Take 10 mg by mouth at bedtime as needed.     hydrochlorothiazide  (HYDRODIURIL ) 25 MG tablet Take 1 tablet (25 mg total) by mouth daily. 90 tablet 0   ibuprofen (ADVIL) 800 MG tablet Take 800 mg by mouth every 6 (six) hours as needed.     lenvatinib  20  mg daily dose (LENVIMA ) 2 x 10 MG capsule Take 2 capsules (20 mg total) by mouth daily. 60 capsule 11   levothyroxine  (SYNTHROID ) 150 MCG tablet Take 1 tablet (150 mcg total) by mouth daily. 30 tablet 1   metFORMIN (GLUCOPHAGE) 500 MG tablet TAKE 1 TABLET BY MOUTH DAILY 90 tablet 1   metoprolol tartrate (LOPRESSOR) 100 MG tablet Take 100 mg by mouth 2 (two) times daily.     No current facility-administered medications for this visit.    SURGICAL HISTORY: No past surgical history on file.  REVIEW OF SYSTEMS:   Review of Systems  Constitutional: Positive for fatigue, decreased appetite, and weight loss. Negative for chills and fever.  HENT: Negative for mouth sores, nosebleeds, sore throat and trouble swallowing.   Eyes: Negative for eye problems and icterus.  Respiratory: Occasional cough from smoking and occasional stable mild dyspnea. Negative for hemoptysis and wheezing.   Cardiovascular: Negative for chest pain and leg swelling.  Gastrointestinal: Positive for left flank pain, occasional constipation, and occasional nausea. Negative for diarrhea and vomiting.  Genitourinary: Negative for bladder incontinence, difficulty urinating, dysuria, frequency and hematuria.   Musculoskeletal: Positive for left flank pain. Negative for gait problem, neck pain and neck stiffness.  Skin: Negative for itching and rash.  Neurological: Negative for dizziness, extremity weakness, gait problem, headaches, light-headedness and seizures.  Hematological: Negative for adenopathy. Does not bruise/bleed easily.  Psychiatric/Behavioral: Negative for confusion, depression and sleep disturbance. The patient is not nervous/anxious.     PHYSICAL EXAMINATION:  Blood pressure (!) 153/91, pulse 88, temperature (!) 97.2 F (36.2 C), temperature source Temporal, resp. rate 15, weight 205 lb 9.6 oz (93.3 kg), SpO2 94%.  ECOG PERFORMANCE STATUS: 1  Physical Exam  Constitutional: Oriented to person, place, and time  and well-developed, well-nourished, and in no distress. HENT:  Head: Normocephalic and atraumatic.  Mouth/Throat: Oropharynx is clear and moist. No oropharyngeal exudate.  Eyes: Conjunctivae are normal. Right eye exhibits no discharge. Left eye exhibits no discharge. No scleral icterus.  Neck: Normal range of motion. Neck supple.  Cardiovascular: Normal rate, regular rhythm, normal heart sounds and intact distal pulses.   Pulmonary/Chest: Effort normal and breath sounds normal. No respiratory distress. No wheezes. No rales.  Abdominal: Soft. Bowel sounds are normal. Exhibits no distension and no mass. There is no tenderness.  Musculoskeletal: No overlying skin changes or swelling over left flank. Mild discomfort with left lateral palpation. Normal range of motion. Exhibits no edema.  Lymphadenopathy:    No cervical adenopathy.  Neurological: Alert and oriented to person, place, and time. Exhibits normal muscle tone. Gait normal. Coordination normal.  Skin: Skin is warm and dry. No rash noted. Not diaphoretic. No erythema. No pallor.  Psychiatric: Mood, memory and judgment normal.  Vitals reviewed.  LABORATORY DATA: Lab Results  Component Value Date   WBC 5.0 01/30/2024   HGB 13.2 01/30/2024   HCT 36.1 (L) 01/30/2024   MCV 86.6 01/30/2024   PLT 332 01/30/2024      Chemistry      Component Value Date/Time  NA 122 (L) 01/30/2024 1417   K 3.6 01/30/2024 1417   CL 84 (L) 01/30/2024 1417   CO2 28 01/30/2024 1417   BUN 9 01/30/2024 1417   CREATININE 0.90 01/30/2024 1417      Component Value Date/Time   CALCIUM 9.7 01/30/2024 1417   ALKPHOS 99 01/30/2024 1417   AST 22 01/30/2024 1417   ALT 27 01/30/2024 1417   BILITOT 0.6 01/30/2024 1417       RADIOGRAPHIC STUDIES:  CT Chest Wo Contrast Result Date: 01/22/2024 CLINICAL DATA:  Kidney cancer staging. EXAM: CT CHEST, ABDOMEN AND PELVIS WITHOUT CONTRAST TECHNIQUE: Multidetector CT imaging of the chest, abdomen and pelvis was  performed following the standard protocol without IV contrast. RADIATION DOSE REDUCTION: This exam was performed according to the departmental dose-optimization program which includes automated exposure control, adjustment of the mA and/or kV according to patient size and/or use of iterative reconstruction technique. COMPARISON:  CT dated 10/02/2023. FINDINGS: Evaluation of this exam is limited in the absence of intravenous contrast. CT CHEST FINDINGS Cardiovascular: There is no cardiomegaly or pericardial effusion. The thoracic aorta and the central pulmonary arteries are unremarkable on this noncontrast CT. Mediastinum/Nodes: Mildly enlarged right hilar lymph node measures 15 mm in short axis (24/2), new or increased since the prior CT. Additional nodular structure in the left hilum measures 13 mm (26/2) and slightly progressed since the prior CT. The esophagus is grossly unremarkable. No mediastinal fluid collection. Lungs/Pleura: Interval development of a small left pleural effusion with partial compressive atelectasis of the left lower lobe. A 4 mm left lower lobe nodule (58/4) appears new since the prior CT. Several additional small nodules throughout the lungs measure up to 4 mm in the right apex (30/4) also new since the prior CT. No pneumothorax. The central airways are patent. Musculoskeletal: Similar appearance of expansile lesion in the posterior right ninth rib no acute osseous pathology. CT ABDOMEN PELVIS FINDINGS No intra-abdominal free air or free fluid. Hepatobiliary: Fatty liver. No biliary dilatation. The gallbladder is unremarkable Pancreas: Unremarkable. No pancreatic ductal dilatation or surrounding inflammatory changes. Spleen: Normal in size without focal abnormality. Adrenals/Urinary Tract: The right adrenal gland is unremarkable. Nodular thickening of the left adrenal gland new since the prior CT suspicious for metastatic disease. Interval increase in the size of left renal mass measuring  approximately 11 x 8 cm in greatest axial dimensions (previously 10 x 7 cm). There is moderate left hydronephrosis secondary to obstruction of the left renal pelvis. There is apparent extension of the mass into the retroperitoneum with loss of fat plane between the renal mass and abdominal aorta as well as extension into the left psoas muscle. Interval increase in the nodular extrarenal masses or adenopathy in the left renal hilum as well as progression of retroperitoneal adenopathy since the prior CT. Evaluation of possible extension into the renal vein is limited in the absence of intravenous contrast. There is no hydronephrosis or nephrolithiasis on the right. The urinary bladder is grossly unremarkable. Stomach/Bowel: There is no bowel obstruction or active inflammation. The appendix is normal. Vascular/Lymphatic: The abdominal aorta and IVC are grossly unremarkable on this noncontrast CT. No portal venous gas. Progression of retroperitoneal adenopathy since the prior CT. Reproductive: The prostate is grossly unremarkable. The seminal vesicles are symmetric. Other: Stranding of the peritoneal reflection along the paracolic gutters. Increase in the size of peripancreatic lymph nodes measuring 8 mm in short axis (56/2). Musculoskeletal: No acute osseous pathology. No suspicious bone lesions. IMPRESSION: 1. Overall  progression of disease since the prior CT. 2. Increase in the size of the left renal mass and extrarenal extension as well as progression of retroperitoneal adenopathy. 3. New nodular thickening of the left adrenal gland suspicious for metastatic disease. 4. Interval development of a small left pleural effusion and new small pulmonary nodules suspicious for metastatic disease. 5. Fatty liver. Electronically Signed   By: Vanetta Chou M.D.   On: 01/22/2024 18:08   CT ABDOMEN PELVIS WO CONTRAST Result Date: 01/22/2024 CLINICAL DATA:  Kidney cancer staging. EXAM: CT CHEST, ABDOMEN AND PELVIS WITHOUT  CONTRAST TECHNIQUE: Multidetector CT imaging of the chest, abdomen and pelvis was performed following the standard protocol without IV contrast. RADIATION DOSE REDUCTION: This exam was performed according to the departmental dose-optimization program which includes automated exposure control, adjustment of the mA and/or kV according to patient size and/or use of iterative reconstruction technique. COMPARISON:  CT dated 10/02/2023. FINDINGS: Evaluation of this exam is limited in the absence of intravenous contrast. CT CHEST FINDINGS Cardiovascular: There is no cardiomegaly or pericardial effusion. The thoracic aorta and the central pulmonary arteries are unremarkable on this noncontrast CT. Mediastinum/Nodes: Mildly enlarged right hilar lymph node measures 15 mm in short axis (24/2), new or increased since the prior CT. Additional nodular structure in the left hilum measures 13 mm (26/2) and slightly progressed since the prior CT. The esophagus is grossly unremarkable. No mediastinal fluid collection. Lungs/Pleura: Interval development of a small left pleural effusion with partial compressive atelectasis of the left lower lobe. A 4 mm left lower lobe nodule (58/4) appears new since the prior CT. Several additional small nodules throughout the lungs measure up to 4 mm in the right apex (30/4) also new since the prior CT. No pneumothorax. The central airways are patent. Musculoskeletal: Similar appearance of expansile lesion in the posterior right ninth rib no acute osseous pathology. CT ABDOMEN PELVIS FINDINGS No intra-abdominal free air or free fluid. Hepatobiliary: Fatty liver. No biliary dilatation. The gallbladder is unremarkable Pancreas: Unremarkable. No pancreatic ductal dilatation or surrounding inflammatory changes. Spleen: Normal in size without focal abnormality. Adrenals/Urinary Tract: The right adrenal gland is unremarkable. Nodular thickening of the left adrenal gland new since the prior CT suspicious  for metastatic disease. Interval increase in the size of left renal mass measuring approximately 11 x 8 cm in greatest axial dimensions (previously 10 x 7 cm). There is moderate left hydronephrosis secondary to obstruction of the left renal pelvis. There is apparent extension of the mass into the retroperitoneum with loss of fat plane between the renal mass and abdominal aorta as well as extension into the left psoas muscle. Interval increase in the nodular extrarenal masses or adenopathy in the left renal hilum as well as progression of retroperitoneal adenopathy since the prior CT. Evaluation of possible extension into the renal vein is limited in the absence of intravenous contrast. There is no hydronephrosis or nephrolithiasis on the right. The urinary bladder is grossly unremarkable. Stomach/Bowel: There is no bowel obstruction or active inflammation. The appendix is normal. Vascular/Lymphatic: The abdominal aorta and IVC are grossly unremarkable on this noncontrast CT. No portal venous gas. Progression of retroperitoneal adenopathy since the prior CT. Reproductive: The prostate is grossly unremarkable. The seminal vesicles are symmetric. Other: Stranding of the peritoneal reflection along the paracolic gutters. Increase in the size of peripancreatic lymph nodes measuring 8 mm in short axis (56/2). Musculoskeletal: No acute osseous pathology. No suspicious bone lesions. IMPRESSION: 1. Overall progression of disease since the  prior CT. 2. Increase in the size of the left renal mass and extrarenal extension as well as progression of retroperitoneal adenopathy. 3. New nodular thickening of the left adrenal gland suspicious for metastatic disease. 4. Interval development of a small left pleural effusion and new small pulmonary nodules suspicious for metastatic disease. 5. Fatty liver. Electronically Signed   By: Vanetta Chou M.D.   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    The patient started induction treatment with immunotherapy with ipilimumab  1 Mg/KG and nivolumab  3 mg/KG every 3 weeks for 4 cycles.  First dose was given on October 02, 2020.  He is currently undergoing maintenance treatment with nivolumab  480 Mg IV every 4 weeks.  Status post 30 cycles.  The patient was on  on observation and he was feeling fine with no concerning complaints except for the left flank pain.  He had evidence for disease progression and the patient started treatment with Cabometyx  40 mg p.o. daily on October 22, 2023 status post 4 weeks of treatment.  He had a restaging CT scan in June 2025 which showed disease progression with increase in the size of the left renal mass with extrarenal extension as well as progression of retroperitoneal adenopathy and new nodular thickening of the left adrenal gland suspicious for metastatic disease. There was also development of a small left pleural effusion.   Therefore, Dr. Sherrod changed his treatment to Keytruda and Lenvatinib . His first cycle of treatment is scheduled for today on 01/30/24.   The patient was seen with Dr. Sherrod today. Labs were reviewed. His sodium is low at 122. We will start him on salt tablets with 1 tablet 2-3x per day and see him back in 7-10 days for evaluation and repeat labs. Dr. Sherrod recommended fluid restriction with free water and recommended electrolyte drinks instead. 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   The patient does not take norco during the day due to driving. He states only the 10 mg norco helps his pain but even then, it does not  completely control the pain. I will send him in oxycodone  instead to see if this better controls his pain. He is not interested in seeing palliative care at this time.   He is not interested in seeing a nutritionist.   I sent him zofran  for an anti-emetic since he does not have any anti-emetics at home and has some aversions to certain foods.   We will see him back in 3 weeks for evaluation and repeat blood work before undergoing cycle #2.   The patient was advised to call immediately if she has any concerning symptoms in the interval. The patient voices understanding of current disease status and treatment options and is in agreement with the current care plan. All questions were answered. The patient knows to call the clinic with any problems, questions or concerns. We can certainly see the patient much sooner if necessary     No orders of the defined types were placed in this encounter.     Kimball Appleby L Cass Vandermeulen, PA-C 01/30/24  ADDENDUM: Hematology/Oncology Attending: I  had a face-to-face encounter with the patient today.  I reviewed his record, lab, recent scan and recommended his care plan.  This is a very pleasant 53 years old white male with a stage IV left clear-cell renal cell carcinoma with rhabdoid features diagnosed in February 2022 with pulmonary involvement and mediastinal lymphadenopathy.  He is status post treatment with immunotherapy with April Mab and nivolumab  every 3 weeks for 4 cycles followed by maintenance treatment with nivolumab  480 Mg IV every 4 weeks for a total of 30 cycles and it was discontinued after 2 years of treatment.  He had evidence for disease progression few months after discontinuation of his treatment and he started treatment with Cabometyx  40 mg p.o. daily in March 2025 discontinued recently due to disease progression. The patient is currently undergoing palliative systemic therapy with pembrolizumab 200 Mg IV every 3 weeks in addition to  lenvatinib  20 mg p.o. daily first dose started January 29, 2024.  He is expected to start the first dose of pembrolizumab tomorrow.  The patient had a recent CT scan of the chest, abdomen and pelvis that showed evidence for disease progression with enlargement of the left renal mass as well as extrarenal extension and progression of retroperitoneal adenopathy and nodular thickening of the left adrenal gland suspicious for metastatic disease.  He also has interval development of small left pleural effusion and new small bilateral pulmonary nodules suspicious for metastatic disease. I recommended for the patient to continue his current treatment with pembrolizumab and lenvatinib  as long he has no significant adverse effects of this treatment or disease progression. For the hyponatremia, I strongly recommend for the patient to have fluid restriction and we will also start him on salt tablet 2-3 tablets a day. For pain management we will refer him through the palliative care team but we will also give him refill of hydrocodone . The patient will come back for follow-up visit next week for evaluation and repeat blood work for close monitoring of his hyponatremia. 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 He was advised to call immediately if he has any concerning symptoms in the interval. The total time spent in the appointment was 30 minutes including review of chart and various tests results, discussions about plan of care and coordination of care plan . Disclaimer: This note was dictated with voice recognition software. Similar sounding words can inadvertently be transcribed and may be missed upon review. Sherrod MARLA Sherrod, MD

## 2024-01-31 ENCOUNTER — Ambulatory Visit

## 2024-01-31 ENCOUNTER — Other Ambulatory Visit: Payer: Self-pay

## 2024-01-31 ENCOUNTER — Telehealth: Payer: Self-pay

## 2024-01-31 VITALS — BP 150/87 | HR 85 | Resp 16

## 2024-01-31 DIAGNOSIS — Z5112 Encounter for antineoplastic immunotherapy: Secondary | ICD-10-CM | POA: Diagnosis not present

## 2024-01-31 DIAGNOSIS — C642 Malignant neoplasm of left kidney, except renal pelvis: Secondary | ICD-10-CM

## 2024-01-31 LAB — T4: T4, Total: 9.1 ug/dL (ref 4.5–12.0)

## 2024-01-31 MED ORDER — SODIUM CHLORIDE 0.9 % IV SOLN
200.0000 mg | Freq: Once | INTRAVENOUS | Status: AC
  Administered 2024-01-31: 200 mg via INTRAVENOUS
  Filled 2024-01-31: qty 200

## 2024-01-31 MED ORDER — SODIUM CHLORIDE 0.9 % IV SOLN: INTRAVENOUS | Status: DC

## 2024-01-31 NOTE — Telephone Encounter (Signed)
 Spoke with patient this morning regarding insurance approval for today's treatment and concerns about pain medication.  Informed patient that, from our standpoint, insurance has approved his treatment for today. Patient expressed concern that his Percocet prescription bottle states it can be taken every 6 hours as needed. Explained to the patient that the medication is prescribed on an as-needed basis and that he is not required to take it if he does not feel it is necessary. Patient voiced understanding and was advised to call with any concerns or questions.

## 2024-01-31 NOTE — Patient Instructions (Signed)

## 2024-02-05 ENCOUNTER — Telehealth: Payer: Self-pay

## 2024-02-05 ENCOUNTER — Other Ambulatory Visit: Payer: Self-pay | Admitting: Nurse Practitioner

## 2024-02-05 DIAGNOSIS — C642 Malignant neoplasm of left kidney, except renal pelvis: Secondary | ICD-10-CM

## 2024-02-05 NOTE — Progress Notes (Unsigned)
 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   The patient started induction treatment with immunotherapy with ipilimumab  1 Mg/KG and nivolumab  3 mg/KG every 3 weeks for 4 cycles.  First dose was given on October 02, 2020.  Bob Ewing is currently undergoing maintenance treatment with nivolumab  480 Mg IV every 4 weeks.  Status post 30 cycles.  The patient was on  on observation and Bob Ewing was feeling fine with no concerning complaints except for the left flank pain.  Bob Ewing had evidence for disease progression and the patient started treatment with Cabometyx  40 mg p.o. daily on October 22, 2023 status post 4 weeks of treatment.  Bob Ewing had a restaging CT scan in June 2025 which showed disease progression with increase in the size of the left renal mass with extrarenal extension as well as progression of retroperitoneal adenopathy and new nodular thickening of the left adrenal gland suspicious for metastatic disease. There was also development of a small left pleural effusion.   Therefore, Dr. Sherrod changed his treatment to Keytruda  and Lenvatinib . His first cycle of treatment was given 01/31/2024.  Patient tolerated initial treatment with Keytruda  well.  Continues lenvatinib  as prescribed.  Severe hyponatremia, only slightly improved since last week.  Continues salt tablets 3 times daily.  Recommend Bob Ewing stay out of work for additional 1 week with work note given.   a referral to social work was made today.   advised Bob Ewing seek FMLA and short-term long-term disability from his employer.  Bob Ewing stated Bob Ewing would look into  this. Repeat labs in 1 week.    Hyponatremia Patient received initial treatment of Keytruda  and lenvatinib  on 01/31/2024.  Sodium was low at 122.  Bob Ewing was started on salt tablets 3 times daily and encouraged to aggressively hydrate.  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  It was not safe for him to drive.  Patient states Bob Ewing has not worked since that day.  Bob Ewing has been religiously taking salt tablets.  Has not done so well with oral hydration.  Sodium level has only improved to 127 over the past 1 week.  The remainder of his CMP is within normal limits.  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  A work note was given to him, informing his employer of need for additional time out and the safety concerns related to the sodium levels being so low.  Bob Ewing will have a lab recheck next week and this will be reassessed based on lab results.  Decreased appetite Patient reports not being able to eat very well.  Tastes have changed.  Gets nauseated when Bob Ewing eats.  Bob Ewing believes hyponatremia is related to low appetite.  Did offer prescription for mirtazapine 7.5 mg every evening.  Bob Ewing declined for now stating, Bob Ewing takes enough medication.  Bob Ewing states Bob Ewing will think about this and may revisit it in the future.  Pelvic pain Patient is  in moderate pain in the pelvis, around the hips, and lower back.  Bob Ewing currently has pain medication prescribed for every 6 hours.  States Bob Ewing takes this only at night as it does make him sleepy.  Bob Ewing would like a new prescription to reflect medication to be given at night only and that after 8 hours Bob Ewing would be able to drive.  This is reasonable request.  I did offer a referral to palliative NP, Bob Ewing.  Advising the patient she may have other options of pain control which may not cause sleepiness or dizziness.  Bob Ewing continues to decline  this service.  Financial insecurity Patient is concerned about the amount of time Bob Ewing is needing to take off work.  Bob Ewing states Bob Ewing has lost health insurance.  Frequent provider visits are difficult to afford.  Trying to help his family pay bills and is not contributing due to lack of work.  A referral was placed today to social worker, Bob Ewing for assistance.  Encouraged patient to seek FMLA and short and long-term disability coverage of her from his employer.  Plan Labs reviewed. -CBC within normal limits. - Na has improved, only to 127 from 122.  Hyponatremia likely multifactorial; poor appetite and nutrition, chemotherapy treatment, immunotherapy, and intermittent diarrhea.  Bob Ewing will continue salt tablets 3 times daily.  Encouraged aggressive oral hydration.  Recheck labs in 1 week. - The remainder of the CMP is within normal limits. Social work referral made today.   Recommend staying out of work for additional week due to hyponatremia.  Work note given.  Lab appointment in 1 week to recheck. Labs/flush, follow-up, and next treatment as scheduled.  The patient understands the plans discussed today and is in agreement with them.  Bob Ewing knows to contact our office if Bob Ewing develops concerns prior to his next appointment.  I provided 30 minutes of face-to-face time during this encounter and > 50% was spent counseling as documented under my assessment and plan.    Bob Bob Lessen, NP  [facility] - A DEPT OF Bob Ewing. [facility] [address] [place] [address] Dept: [phone] Dept Fax: [phone]   No orders of the defined types were placed in this encounter.     CHIEF COMPLAINT:  CC: Primary malignant neoplasm of left kidney with metastases   Current Treatment: Systemic therapy with pembrolizumab  200 mg IV every 3 weeks in addition to lenvatinib  currently.  First dose lenvatinib  was given January 29, 2024.  Initial treatment  with Keytruda  given 01/31/2024.  INTERVAL HISTORY:  Bob Ewing is here today for repeat clinical assessment.  The patient was last seen by Cassie, Pa, on 01/30/2024.  Today, Bob Ewing is following up for toxicity check after receiving initial dose Keytruda  on 01/31/2024.  Bob Ewing was also having moderate hyponatremia.  This has only improved minimally since last week.  Bob Ewing has been out of work and recommend Bob Ewing stay out of work due to hyponatremia.  A work note was given to the patient today.  Will recheck labs next week.  Bob Ewing continues to have considerable pain in the pelvis, lower back, and around the hip.  Taking pain medication only at night, prior to sleep.  Bob Ewing is unable to work if taking narcotic pain medication.  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  Bob Ewing has decreased appetite.  Feels nauseous when Bob Ewing tries to eat.  Does not like to aggressively hydrate.  Water does not taste good and Bob Ewing does not like Gatorade or electrolyte replacement drinks.  Bob Ewing denies fevers or chills. His appetite is  poor. His weight has decreased 5 pounds over last week.  Bob Ewing denies chest pain, chest pressure, or shortness of breath. Bob Ewing denies headaches or visual disturbances. Bob Ewing denies vomiting or changes in bowel or bladder habits.  Bob Ewing does frequently alternate between constipation and diarrhea.  I have reviewed the past medical history, past surgical history, social history and family history with the patient and they are unchanged from previous note.  ALLERGIES:  has no active allergies.  MEDICATIONS:  Current Outpatient Medications  Medication Sig Dispense Refill   cyclobenzaprine (FLEXERIL) 10 MG tablet Take 10 mg by mouth at bedtime as needed.     hydrochlorothiazide  (HYDRODIURIL ) 25 MG tablet Take 1 tablet (25 mg total) by mouth daily. 90 tablet 0   ibuprofen (ADVIL) 800 MG tablet Take 800 mg by mouth every 6 (six) hours as needed.     lenvatinib  20 mg daily dose (LENVIMA ) 2 x  10 MG capsule Take 2 capsules (20 mg total) by mouth daily. 60 capsule 11   levothyroxine  (SYNTHROID ) 150 MCG tablet Take 1 tablet (150 mcg total) by mouth daily. 30 tablet 1   metFORMIN (GLUCOPHAGE) 500 MG tablet TAKE 1 TABLET BY MOUTH DAILY 90 tablet 1   metoprolol tartrate (LOPRESSOR) 100 MG tablet Take 100 mg by mouth 2 (two) times daily.     ondansetron  (ZOFRAN ) 8 MG tablet Take 1 tablet (8 mg total) by mouth every 8 (eight) hours as needed for nausea or vomiting. 30 tablet 2   sodium chloride  1 g tablet Take 1 tablet (1 g total) by mouth 3 (three) times daily with meals. 60 tablet 0   oxyCODONE -acetaminophen  (PERCOCET/ROXICET) 5-325 MG tablet Take at bedtime a needed for pain. Wait 8 hours after taking to return to work.     No current facility-administered medications for this visit.    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 [facility])   09/21/2020 Cancer Staging   Staging form: Kidney, AJCC 8th Edition - Clinical: Stage IV (cT2a, cN0, pM1) - Signed by Amadeo Windell SAILOR, MD on 09/21/2020 Histologic grade (G): G4 Histologic grading system: 4 grade system Histologic sub-type: Clear cell Sarcomatoid features: Absent Rhabdoid features: Present Tumor necrosis: Absent Invasion beyond capsule into fat or perisinus tissues: Unknown Venous invasion (V): VX - Cannot be assessed   10/02/2020 - 02/11/2022 Chemotherapy   Patient is on Treatment Plan : RENAL CELL CARCINOMA Nivolumab  + Ipilimumab  q21d / Nivolumab  q14d     10/02/2020 - 01/12/2023 Chemotherapy   Patient is on Treatment Plan : RENAL CELL CARCINOMA Nivolumab  (3) + Ipilimumab  (1) q21d x 4 cycles  / Nivolumab  (480) q28d     01/31/2024 -  Chemotherapy   Patient is on Treatment Plan : Rrenal Lenvatinib  (20) D1-21 + Pembrolizumab  (200)  D1 q21d     Hypercalcemia      REVIEW OF SYSTEMS:   Constitutional: Denies fevers or chills.  His appetite is poor and Bob Ewing has had a 5 pound weight loss in the last 1 week. Eyes: Denies blurriness of vision Ears, nose, mouth, throat, and face: Denies mucositis or sore throat Respiratory: Denies  cough, dyspnea or wheezes Cardiovascular: Denies palpitation, chest discomfort or lower extremity swelling Gastrointestinal:  Denies  heartburn or change in bowel habits.  Does have nausea without vomiting. Skin: Denies abnormal skin rashes Lymphatics: Denies new lymphadenopathy or easy bruising Neurological:Denies numbness, tingling or new weaknesses Behavioral/Psych: Mood is stable, no new changes  All other systems were reviewed with the patient and are negative.   VITALS:   Today's Vitals   02/06/24 0843 02/06/24 0848 02/06/24 1000  BP: (!) 154/92 (!) 146/92   Pulse:  96   Resp:  17   Temp:  97.8 F (36.6 C)   SpO2:  99%   Weight:  200 lb 11.2 oz (91 kg)   PainSc:   7    Body mass index is 31.43 kg/m.   Wt Readings from Last 3 Encounters:  02/06/24 200 lb 11.2 oz (91 kg)  01/30/24 205 lb 9.6 oz (93.3 kg)  12/28/23 222 lb 9.6 oz (101 kg)    Body mass index is 31.43 kg/m.  Performance status (ECOG): 1 - Symptomatic but completely ambulatory  PHYSICAL EXAM:   GENERAL:alert and appears to be in discomfort.  Unable to sit for long period of time.  Has to stand up frequently during the visit.  Appears to be in pain. SKIN: skin color, texture, turgor are normal, no rashes or significant lesions EYES: normal, Conjunctiva are pink and non-injected, sclera clear OROPHARYNX:no exudate, no erythema and lips, buccal mucosa, and tongue normal  NECK: supple, thyroid  normal size, non-tender, without nodularity LYMPH:  no palpable lymphadenopathy in the cervical, axillary or inguinal LUNGS: clear to auscultation and percussion with normal breathing effort HEART: regular rate & rhythm  and no murmurs and no lower extremity edema ABDOMEN:abdomen soft, with normal bowel sounds.  There is generalized abdominal tenderness with light palpation. Musculoskeletal:no cyanosis of digits and no clubbing  NEURO: alert & oriented x 3 with fluent speech, no focal motor/sensory deficits  LABORATORY DATA:  I have reviewed the data as listed    Component Value Date/Time   NA 127 (L) 02/06/2024 0812   K 4.1 02/06/2024 0812   CL 90 (L) 02/06/2024 0812   CO2 29 02/06/2024 0812   GLUCOSE 122 (H) 02/06/2024 0812   BUN 12 02/06/2024 0812   CREATININE 0.80 02/06/2024 0812   CALCIUM 9.7 02/06/2024 0812   PROT 7.6 02/06/2024 0812   ALBUMIN 3.8 02/06/2024 0812   AST 17 02/06/2024 0812   ALT 33 02/06/2024 0812   ALKPHOS 118 02/06/2024 0812   BILITOT 0.7 02/06/2024 0812   GFRNONAA >60 02/06/2024 0812   GFRAA >60 05/03/2020 0244    Lab Results  Component Value Date   WBC 4.8 02/06/2024   NEUTROABS 3.8 02/06/2024   HGB 13.1 02/06/2024   HCT 37.0 (L) 02/06/2024   MCV 90.5 02/06/2024   PLT 264 02/06/2024    RADIOGRAPHIC STUDIES: CT Chest Wo Contrast Result Date: 01/22/2024 CLINICAL DATA:  Kidney cancer staging. EXAM: CT CHEST, ABDOMEN AND PELVIS WITHOUT CONTRAST TECHNIQUE: Multidetector CT imaging of the chest, abdomen and pelvis was performed following the standard protocol without IV contrast. RADIATION DOSE REDUCTION: This exam was performed according to the departmental dose-optimization program which includes automated exposure control, adjustment of the mA and/or kV according to patient size and/or use of iterative reconstruction technique. COMPARISON:  CT dated 10/02/2023. FINDINGS: Evaluation of this exam is limited in the absence of intravenous contrast. CT CHEST FINDINGS Cardiovascular: There is no cardiomegaly or pericardial effusion. The thoracic  aorta and the central pulmonary arteries are unremarkable on this noncontrast CT. Mediastinum/Nodes: Mildly enlarged right hilar lymph  node measures 15 mm in short axis (24/2), new or increased since the prior CT. Additional nodular structure in the left hilum measures 13 mm (26/2) and slightly progressed since the prior CT. The esophagus is grossly unremarkable. No mediastinal fluid collection. Lungs/Pleura: Interval development of a small left pleural effusion with partial compressive atelectasis of the left lower lobe. A 4 mm left lower lobe nodule (58/4) appears new since the prior CT. Several additional small nodules throughout the lungs measure up to 4 mm in the right apex (30/4) also new since the prior CT. No pneumothorax. The central airways are patent. Musculoskeletal: Similar appearance of expansile lesion in the posterior right ninth rib no acute osseous pathology. CT ABDOMEN PELVIS FINDINGS No intra-abdominal free air or free fluid. Hepatobiliary: Fatty liver. No biliary dilatation. The gallbladder is unremarkable Pancreas: Unremarkable. No pancreatic ductal dilatation or surrounding inflammatory changes. Spleen: Normal in size without focal abnormality. Adrenals/Urinary Tract: The right adrenal gland is unremarkable. Nodular thickening of the left adrenal gland new since the prior CT suspicious for metastatic disease. Interval increase in the size of left renal mass measuring approximately 11 x 8 cm in greatest axial dimensions (previously 10 x 7 cm). There is moderate left hydronephrosis secondary to obstruction of the left renal pelvis. There is apparent extension of the mass into the retroperitoneum with loss of fat plane between the renal mass and abdominal aorta as well as extension into the left psoas muscle. Interval increase in the nodular extrarenal masses or adenopathy in the left renal hilum as well as progression of retroperitoneal adenopathy since the prior CT. Evaluation of possible extension into the renal vein is limited in the absence of intravenous contrast. There is no hydronephrosis or nephrolithiasis on the right.  The urinary bladder is grossly unremarkable. Stomach/Bowel: There is no bowel obstruction or active inflammation. The appendix is normal. Vascular/Lymphatic: The abdominal aorta and IVC are grossly unremarkable on this noncontrast CT. No portal venous gas. Progression of retroperitoneal adenopathy since the prior CT. Reproductive: The prostate is grossly unremarkable. The seminal vesicles are symmetric. Other: Stranding of the peritoneal reflection along the paracolic gutters. Increase in the size of peripancreatic lymph nodes measuring 8 mm in short axis (56/2). Musculoskeletal: No acute osseous pathology. No suspicious bone lesions. IMPRESSION: 1. Overall progression of disease since the prior CT. 2. Increase in the size of the left renal mass and extrarenal extension as well as progression of retroperitoneal adenopathy. 3. New nodular thickening of the left adrenal gland suspicious for metastatic disease. 4. Interval development of a small left pleural effusion and new small pulmonary nodules suspicious for metastatic disease. 5. Fatty liver. Electronically Signed   By: Vanetta Chou M.D.   On: 01/22/2024 18:08   CT ABDOMEN PELVIS WO CONTRAST Result Date: 01/22/2024 CLINICAL DATA:  Kidney cancer staging. EXAM: CT CHEST, ABDOMEN AND PELVIS WITHOUT CONTRAST TECHNIQUE: Multidetector CT imaging of the chest, abdomen and pelvis was performed following the standard protocol without IV contrast. RADIATION DOSE REDUCTION: This exam was performed according to the departmental dose-optimization program which includes automated exposure control, adjustment of the mA and/or kV according to patient size and/or use of iterative reconstruction technique. COMPARISON:  CT dated 10/02/2023. FINDINGS: Evaluation of this exam is limited in the absence of intravenous contrast. CT CHEST FINDINGS Cardiovascular: There is no cardiomegaly or pericardial effusion. The thoracic aorta and the central pulmonary  arteries are  unremarkable on this noncontrast CT. Mediastinum/Nodes: Mildly enlarged right hilar lymph node measures 15 mm in short axis (24/2), new or increased since the prior CT. Additional nodular structure in the left hilum measures 13 mm (26/2) and slightly progressed since the prior CT. The esophagus is grossly unremarkable. No mediastinal fluid collection. Lungs/Pleura: Interval development of a small left pleural effusion with partial compressive atelectasis of the left lower lobe. A 4 mm left lower lobe nodule (58/4) appears new since the prior CT. Several additional small nodules throughout the lungs measure up to 4 mm in the right apex (30/4) also new since the prior CT. No pneumothorax. The central airways are patent. Musculoskeletal: Similar appearance of expansile lesion in the posterior right ninth rib no acute osseous pathology. CT ABDOMEN PELVIS FINDINGS No intra-abdominal free air or free fluid. Hepatobiliary: Fatty liver. No biliary dilatation. The gallbladder is unremarkable Pancreas: Unremarkable. No pancreatic ductal dilatation or surrounding inflammatory changes. Spleen: Normal in size without focal abnormality. Adrenals/Urinary Tract: The right adrenal gland is unremarkable. Nodular thickening of the left adrenal gland new since the prior CT suspicious for metastatic disease. Interval increase in the size of left renal mass measuring approximately 11 x 8 cm in greatest axial dimensions (previously 10 x 7 cm). There is moderate left hydronephrosis secondary to obstruction of the left renal pelvis. There is apparent extension of the mass into the retroperitoneum with loss of fat plane between the renal mass and abdominal aorta as well as extension into the left psoas muscle. Interval increase in the nodular extrarenal masses or adenopathy in the left renal hilum as well as progression of retroperitoneal adenopathy since the prior CT. Evaluation of possible extension into the renal vein is limited in the  absence of intravenous contrast. There is no hydronephrosis or nephrolithiasis on the right. The urinary bladder is grossly unremarkable. Stomach/Bowel: There is no bowel obstruction or active inflammation. The appendix is normal. Vascular/Lymphatic: The abdominal aorta and IVC are grossly unremarkable on this noncontrast CT. No portal venous gas. Progression of retroperitoneal adenopathy since the prior CT. Reproductive: The prostate is grossly unremarkable. The seminal vesicles are symmetric. Other: Stranding of the peritoneal reflection along the paracolic gutters. Increase in the size of peripancreatic lymph nodes measuring 8 mm in short axis (56/2). Musculoskeletal: No acute osseous pathology. No suspicious bone lesions. IMPRESSION: 1. Overall progression of disease since the prior CT. 2. Increase in the size of the left renal mass and extrarenal extension as well as progression of retroperitoneal adenopathy. 3. New nodular thickening of the left adrenal gland suspicious for metastatic disease. 4. Interval development of a small left pleural effusion and new small pulmonary nodules suspicious for metastatic disease. 5. Fatty liver. Electronically Signed   By: Vanetta Chou M.D.   On: 01/22/2024 18:08

## 2024-02-05 NOTE — Telephone Encounter (Signed)
 Spoke with patient this afternoon regarding his appointment scheduled for Thursday, 02/08/24. Patient reported that he is off work today and tomorrow and asked if he could be seen on one of those days instead. He shared that his employer has been reaching out to determine his availability to return to work. Informed patient that if he needs a work note from our office, we would be happy to provide one. Also advised him to contact his insurance regarding FMLA paperwork due to his diagnosis and ongoing treatments.  Patient reported symptoms of nausea and weakness, stating he is only able to tolerate liquids. Informed patient that Cassie, PA has sent in a prescription for Zofran , which he picked up but has not yet taken. Advised patient to take Zofran  on an empty stomach and attempt to eat about 30 minutes to an hour afterward.  Spoke with Powell, NP, who is available to see the patient tomorrow. Labs have been scheduled for 08:00 and the visit with Powell is set for 08:30. Patient agreed to the appointment.

## 2024-02-05 NOTE — Assessment & Plan Note (Addendum)
 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   The patient started induction treatment with immunotherapy with ipilimumab  1 Mg/KG and nivolumab  3 mg/KG every 3 weeks for 4 cycles.  First dose was given on October 02, 2020.  He is currently undergoing maintenance treatment with nivolumab  480 Mg IV every 4 weeks.  Status post 30 cycles.  The patient was on  on observation and he was feeling fine with no concerning complaints except for the left flank pain.  He had evidence for disease progression and the patient started treatment with Cabometyx  40 mg p.o. daily on October 22, 2023 status post 4 weeks of treatment.  He had a restaging CT scan in June 2025 which showed disease progression with increase in the size of the left renal mass with extrarenal extension as well as progression of retroperitoneal adenopathy and new nodular thickening of the left adrenal gland suspicious for metastatic disease. There was also development of a small left pleural effusion.   Therefore, Dr. Sherrod changed his treatment to Keytruda  and Lenvatinib . His first cycle of treatment was given 01/31/2024.

## 2024-02-06 ENCOUNTER — Inpatient Hospital Stay

## 2024-02-06 ENCOUNTER — Inpatient Hospital Stay: Admitting: Licensed Clinical Social Worker

## 2024-02-06 ENCOUNTER — Encounter: Payer: Self-pay | Admitting: Nurse Practitioner

## 2024-02-06 ENCOUNTER — Ambulatory Visit: Admitting: Nurse Practitioner

## 2024-02-06 ENCOUNTER — Other Ambulatory Visit: Payer: Self-pay

## 2024-02-06 VITALS — BP 146/92 | HR 96 | Temp 97.8°F | Resp 17 | Wt 200.7 lb

## 2024-02-06 DIAGNOSIS — C642 Malignant neoplasm of left kidney, except renal pelvis: Secondary | ICD-10-CM

## 2024-02-06 DIAGNOSIS — G893 Neoplasm related pain (acute) (chronic): Secondary | ICD-10-CM

## 2024-02-06 DIAGNOSIS — Z5112 Encounter for antineoplastic immunotherapy: Secondary | ICD-10-CM | POA: Diagnosis not present

## 2024-02-06 LAB — CBC WITH DIFFERENTIAL (CANCER CENTER ONLY)
Abs Immature Granulocytes: 0.02 K/uL (ref 0.00–0.07)
Basophils Absolute: 0 K/uL (ref 0.0–0.1)
Basophils Relative: 0 %
Eosinophils Absolute: 0.1 K/uL (ref 0.0–0.5)
Eosinophils Relative: 1 %
HCT: 37 % — ABNORMAL LOW (ref 39.0–52.0)
Hemoglobin: 13.1 g/dL (ref 13.0–17.0)
Immature Granulocytes: 0 %
Lymphocytes Relative: 10 %
Lymphs Abs: 0.5 K/uL — ABNORMAL LOW (ref 0.7–4.0)
MCH: 32 pg (ref 26.0–34.0)
MCHC: 35.4 g/dL (ref 30.0–36.0)
MCV: 90.5 fL (ref 80.0–100.0)
Monocytes Absolute: 0.5 K/uL (ref 0.1–1.0)
Monocytes Relative: 10 %
Neutro Abs: 3.8 K/uL (ref 1.7–7.7)
Neutrophils Relative %: 79 %
Platelet Count: 264 K/uL (ref 150–400)
RBC: 4.09 MIL/uL — ABNORMAL LOW (ref 4.22–5.81)
RDW: 14.1 % (ref 11.5–15.5)
WBC Count: 4.8 K/uL (ref 4.0–10.5)
nRBC: 0 % (ref 0.0–0.2)

## 2024-02-06 LAB — CMP (CANCER CENTER ONLY)
ALT: 33 U/L (ref 0–44)
AST: 17 U/L (ref 15–41)
Albumin: 3.8 g/dL (ref 3.5–5.0)
Alkaline Phosphatase: 118 U/L (ref 38–126)
Anion gap: 8 (ref 5–15)
BUN: 12 mg/dL (ref 6–20)
CO2: 29 mmol/L (ref 22–32)
Calcium: 9.7 mg/dL (ref 8.9–10.3)
Chloride: 90 mmol/L — ABNORMAL LOW (ref 98–111)
Creatinine: 0.8 mg/dL (ref 0.61–1.24)
GFR, Estimated: >60 mL/min (ref >60)
Glucose, Bld: 122 mg/dL — ABNORMAL HIGH (ref 70–99)
Potassium: 4.1 mmol/L (ref 3.5–5.1)
Sodium: 127 mmol/L — ABNORMAL LOW (ref 135–145)
Total Bilirubin: 0.7 mg/dL (ref 0.0–1.2)
Total Protein: 7.6 g/dL (ref 6.5–8.1)

## 2024-02-06 MED ORDER — OXYCODONE-ACETAMINOPHEN 5-325 MG PO TABS: ORAL_TABLET | ORAL | Status: DC

## 2024-02-06 NOTE — Progress Notes (Signed)
 [facility] CSW Progress Note  Clinical Child psychotherapist contacted patient by phone to follow-up on concerns regarding applying for SSDI.    Interventions: Provided patient with information about SSDI and SSI.  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  Pt states he does not have any disability benefits through his employer.  Pt pays out of pocket for [employer] insurance.  Pt will see CSW in person on 7/15 to sign a referral for the [facility] and discuss supportive services available.         Follow Up Plan:  Patient will come to support center 7/15    Devere JONELLE Manna, LCSW Clinical Social Worker [facility]

## 2024-02-08 ENCOUNTER — Inpatient Hospital Stay: Admitting: Physician Assistant

## 2024-02-08 ENCOUNTER — Inpatient Hospital Stay

## 2024-02-09 ENCOUNTER — Encounter: Payer: Self-pay | Admitting: Internal Medicine

## 2024-02-12 ENCOUNTER — Other Ambulatory Visit: Payer: Self-pay | Admitting: Medical Oncology

## 2024-02-12 ENCOUNTER — Other Ambulatory Visit: Payer: Self-pay

## 2024-02-12 DIAGNOSIS — C642 Malignant neoplasm of left kidney, except renal pelvis: Secondary | ICD-10-CM

## 2024-02-12 NOTE — Progress Notes (Unsigned)
 Lab orders entered

## 2024-02-13 ENCOUNTER — Inpatient Hospital Stay

## 2024-02-13 ENCOUNTER — Telehealth: Payer: Self-pay | Admitting: Internal Medicine

## 2024-02-13 ENCOUNTER — Other Ambulatory Visit (HOSPITAL_COMMUNITY): Payer: Self-pay

## 2024-02-13 ENCOUNTER — Telehealth: Payer: Self-pay | Admitting: Medical Oncology

## 2024-02-13 ENCOUNTER — Other Ambulatory Visit: Payer: Self-pay | Admitting: Physician Assistant

## 2024-02-13 ENCOUNTER — Inpatient Hospital Stay: Admitting: Licensed Clinical Social Worker

## 2024-02-13 DIAGNOSIS — C642 Malignant neoplasm of left kidney, except renal pelvis: Secondary | ICD-10-CM

## 2024-02-13 DIAGNOSIS — Z5112 Encounter for antineoplastic immunotherapy: Secondary | ICD-10-CM | POA: Diagnosis not present

## 2024-02-13 LAB — CMP (CANCER CENTER ONLY)
ALT: 22 U/L (ref 0–44)
AST: 18 U/L (ref 15–41)
Albumin: 3.6 g/dL (ref 3.5–5.0)
Alkaline Phosphatase: 132 U/L — ABNORMAL HIGH (ref 38–126)
Anion gap: 9 (ref 5–15)
BUN: 9 mg/dL (ref 6–20)
CO2: 30 mmol/L (ref 22–32)
Calcium: 9.4 mg/dL (ref 8.9–10.3)
Chloride: 89 mmol/L — ABNORMAL LOW (ref 98–111)
Creatinine: 0.74 mg/dL (ref 0.61–1.24)
GFR, Estimated: >60 mL/min (ref >60)
Glucose, Bld: 110 mg/dL — ABNORMAL HIGH (ref 70–99)
Potassium: 3.9 mmol/L (ref 3.5–5.1)
Sodium: 128 mmol/L — ABNORMAL LOW (ref 135–145)
Total Bilirubin: 0.6 mg/dL (ref 0.0–1.2)
Total Protein: 7.5 g/dL (ref 6.5–8.1)

## 2024-02-13 LAB — CBC WITH DIFFERENTIAL (CANCER CENTER ONLY)
Abs Immature Granulocytes: 0.04 K/uL (ref 0.00–0.07)
Basophils Absolute: 0 K/uL (ref 0.0–0.1)
Basophils Relative: 0 %
Eosinophils Absolute: 0.1 K/uL (ref 0.0–0.5)
Eosinophils Relative: 1 %
HCT: 32.8 % — ABNORMAL LOW (ref 39.0–52.0)
Hemoglobin: 11.5 g/dL — ABNORMAL LOW (ref 13.0–17.0)
Immature Granulocytes: 1 %
Lymphocytes Relative: 10 %
Lymphs Abs: 0.6 K/uL — ABNORMAL LOW (ref 0.7–4.0)
MCH: 31.9 pg (ref 26.0–34.0)
MCHC: 35.1 g/dL (ref 30.0–36.0)
MCV: 90.9 fL (ref 80.0–100.0)
Monocytes Absolute: 0.7 K/uL (ref 0.1–1.0)
Monocytes Relative: 11 %
Neutro Abs: 4.9 K/uL (ref 1.7–7.7)
Neutrophils Relative %: 77 %
Platelet Count: 433 K/uL — ABNORMAL HIGH (ref 150–400)
RBC: 3.61 MIL/uL — ABNORMAL LOW (ref 4.22–5.81)
RDW: 13.6 % (ref 11.5–15.5)
WBC Count: 6.3 K/uL (ref 4.0–10.5)
nRBC: 0 % (ref 0.0–0.2)

## 2024-02-13 NOTE — Progress Notes (Signed)
 Specialty Pharmacy Ongoing Clinical Assessment Note  Bob Ewing is a 53 y.o. male who is being followed by the specialty pharmacy service for RxSp Oncology   Patient's specialty medication(s) reviewed today: Lenvatinib  Mesylate (LENVIMA )   Missed doses in the last 4 weeks: 0   Patient/Caregiver did not have any additional questions or concerns.   Therapeutic benefit summary: Patient is achieving benefit   Adverse events/side effects summary: Experienced adverse events/side effects (constipation, using OTC medications to control)   Patient's therapy is appropriate to: Continue    Goals Addressed             This Visit's Progress    Slow Disease Progression   No change    Patient is initiating therapy. Patient will maintain adherence.         Follow up: 3 months  Bob Ewing Specialty Pharmacist

## 2024-02-13 NOTE — Telephone Encounter (Signed)
 Financial insecurity Patient stated, My situation is not good. I am losing everything. 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   He expressed concern about his financial situation and stated that if Dr. Sherrod recommends not going back to work he will pursue government assistance.  Patient also reports increasing back pain and has requested a refill of oxycodone . He states that the current regimen (taking  at bedtime prn ) is insufficient, and he requires it more frequently throughout the day to manage his symptoms effectively.  I instructed him that he cannot drive if he is taking pain medication such as Oxycodone . He voiced understanding .

## 2024-02-13 NOTE — Progress Notes (Signed)
 [facility] CSW Progress Note  Visual merchandiser met with patient to follow-up on need for community resources.    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  Pt encouraged to apply for food stamps to establish eligibility for [employer] which would offer some financial reprieve.  Pt states he has gone to the [employer] and [employer] previously and received some assistance and is agreeable to returning if needed.  Pt also reporting pain is uncontrolled which is part of what is preventing him from being able to work.  CSW discussed a referral to palliative care to which pt verbalized agreement.  Palliative to see pt at next infusion.  CSW discussed a referral to the [facility] w/ pt to start the process to apply for SSDI.  Pt would like to hold off on that referral for now, but will contact CSW when he wishes to pursue applying.      Follow Up Plan:  Patient will contact CSW with any support or resource needs    Devere JONELLE Manna, LCSW Clinical Social Worker [facility]

## 2024-02-13 NOTE — Telephone Encounter (Signed)
 Spoke with patient confirming upcoming appointment

## 2024-02-15 ENCOUNTER — Other Ambulatory Visit: Payer: Self-pay | Admitting: Physician Assistant

## 2024-02-15 ENCOUNTER — Telehealth: Payer: Self-pay | Admitting: Medical Oncology

## 2024-02-15 DIAGNOSIS — C642 Malignant neoplasm of left kidney, except renal pelvis: Secondary | ICD-10-CM

## 2024-02-15 DIAGNOSIS — G893 Neoplasm related pain (acute) (chronic): Secondary | ICD-10-CM

## 2024-02-15 MED ORDER — OXYCODONE-ACETAMINOPHEN 5-325 MG PO TABS: ORAL_TABLET | ORAL | 0 refills | Status: DC

## 2024-02-15 MED ORDER — OXYCODONE-ACETAMINOPHEN 5-325 MG PO TABS: 1.0000 | ORAL_TABLET | Freq: Four times a day (QID) | ORAL | 0 refills | Status: DC | PRN

## 2024-02-15 NOTE — Telephone Encounter (Signed)
 Pt requested a refill for his oxycodone   His pharmacy said the last oxycodone  rx was prescribed on 01/30/24 . It was for  Oxycodone  3-325mg  . Take 1 tablet po every 6 hours prn pain #30 for 7 days.

## 2024-02-15 NOTE — Telephone Encounter (Signed)
 Pt requests refill to increase pain med to 1 q 6 hours prn . I called [facility] and cancelled previous rx from [facility].

## 2024-02-15 NOTE — Telephone Encounter (Signed)
Requests refill for Oxycodone. 

## 2024-02-19 NOTE — Progress Notes (Unsigned)
 [facility] OFFICE PROGRESS NOTE  Bob Kettle, MD 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   PRIOR THERAPY:  1) Status post renal biopsy on September 14, 2020 confirming the presence of clear-cell renal cell carcinoma with rhabdoid features. 2) status post induction treatment with immunotherapy with ipilimumab  1 Mg/KG and nivolumab  3 mg/KG every 3 weeks for 4 cycles.  First dose was given on October 02, 2020. 3) Maintenance treatment with nivolumab  480 Mg IV every 4 weeks.  Status post 30 cycles. 3) Cabometyx  40 mg p.o. daily started on October 22, 2023.  This was discontinued after 3 months secondary to disease progression.  CURRENT THERAPY: Systemic therapy with pembrolizumab  200 Mg IV every 3 weeks in addition to lenvatinib  orally. First dose January 29, 2024.   INTERVAL HISTORY: Bob Ewing 52 y.o. male returns to the clinic today for a follow up visit. The patient was last seen in the clinic on 01/30/24. When the patient was seen by Dr. Sherrod on 01/23/24, the patient was found to have disease progression. Therefore, Dr. Sherrod recommended initiating treatment with Keytruda  and Lenvatinib . He received his lenvatinib  on 01/29/24.   With the disease progression, he has been endorsing pain in the left abdomen/flank. He was initially reluctant but eventually agreed to see palliative care. 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   For pain, he takes oxycodone  every 4-5 hours. His pain is localized the the left flank. It is effective but wears off before his next dose. He has significant constipation due to this. He  experiences gas and discomfort.   He is currently taking sodium tablets three times a day, but due to running out, he has only taken them twice a day for the last two days.  He has experienced a significant weight loss of 20 to 30 pounds. He is concerned about his ability to work and maintain insurance coverage due to his health condition. He mentions that he is alone without family support and is worried about his financial situation, including a recent issue with his truck [employer].  He reports changes in taste preferences, disliking foods he previously enjoyed.  No nausea or vomiting related to treatment, but he notes that he does not like the taste of food. He denies rashes.   He is here before starting cycle #2 of keytruda .    MEDICAL HISTORY: Past Medical History:  Diagnosis Date   Cancer (HCC)    Hypertension    Stroke (HCC)     ALLERGIES:  has no active allergies.  MEDICATIONS:  Current Outpatient Medications  Medication Sig Dispense Refill   cyclobenzaprine (FLEXERIL) 10 MG tablet Take 10 mg by mouth at bedtime as needed.     hydrochlorothiazide  (HYDRODIURIL ) 25 MG tablet Take 1 tablet (25 mg total) by mouth daily. 90 tablet 0   ibuprofen (ADVIL) 800 MG tablet Take 800 mg by mouth every 6 (six) hours as needed.     lenvatinib  20 mg daily dose (LENVIMA ) 2 x 10 MG capsule Take 2 capsules (20 mg total) by mouth daily. 60 capsule 11   levothyroxine  (SYNTHROID ) 150 MCG tablet Take  1 tablet (150 mcg total) by mouth daily. 30 tablet 1   metFORMIN (GLUCOPHAGE) 500 MG tablet TAKE 1 TABLET BY MOUTH DAILY 90 tablet 1   metoprolol tartrate (LOPRESSOR) 100 MG tablet Take 100 mg by mouth 2 (two) times daily.     ondansetron  (ZOFRAN ) 8 MG tablet Take 1 tablet (8 mg total) by mouth every 8 (eight) hours as needed for nausea or vomiting. 30 tablet 2   oxyCODONE -acetaminophen  (PERCOCET/ROXICET) 5-325 MG tablet Take 1 tablet by mouth every 6 (six) hours as needed for severe pain (pain  score 7-10) or moderate pain (pain score 4-6). 30 tablet 0   sodium chloride  1 g tablet Take 1 tablet (1 g total) by mouth 3 (three) times daily with meals. 60 tablet 0   No current facility-administered medications for this visit.    SURGICAL HISTORY: No past surgical history on file.  REVIEW OF SYSTEMS:   Review of Systems  Constitutional: Positive for fatigue, appetite change, and weight loss. Negative for chills and fever.  HENT: Positive for taste alterations. Negative for mouth sores, nosebleeds, sore throat and trouble swallowing.   Eyes: Negative for eye problems and icterus.  Respiratory: Occasional cough from smoking and occasional dyspnea. Negative for hemoptysis and wheezing.   Cardiovascular: Negative for chest pain and leg swelling.  Gastrointestinal: Positive for left sided abdominal discomfort. Positive for constipation. Negative for diarrhea, nausea and vomiting.  Genitourinary: Negative for bladder incontinence, difficulty urinating, dysuria, frequency and hematuria.   Musculoskeletal: Positive for left flank pain. Negative for gait problem, neck pain and neck stiffness.  Skin: Negative for itching and rash.  Neurological: Negative for dizziness, extremity weakness, gait problem, headaches, light-headedness and seizures.  Hematological: Negative for adenopathy. Does not bruise/bleed easily.  Psychiatric/Behavioral: Negative for confusion, depression and sleep disturbance. The patient is not nervous/anxious.     PHYSICAL EXAMINATION:  There were no vitals taken for this visit.  ECOG PERFORMANCE STATUS: 1  Physical Exam  Constitutional: Oriented to person, place, and time and well-developed, well-nourished, and in no distress. HENT:  Head: Normocephalic and atraumatic.  Mouth/Throat: Oropharynx is clear and moist. No oropharyngeal exudate.  Eyes: Conjunctivae are normal. Right eye exhibits no discharge. Left eye exhibits no discharge. No scleral icterus.  Neck:  Normal range of motion. Neck supple.  Cardiovascular: Normal rate, regular rhythm, normal heart sounds and intact distal pulses.   Pulmonary/Chest: Effort normal and breath sounds normal. No respiratory distress. No wheezes. No rales.  Abdominal: Soft. Bowel sounds are normal. Exhibits no distension and no mass. There is no tenderness.  Musculoskeletal: No overlying skin changes or swelling over left flank. Normal range of motion. Exhibits no edema.  Lymphadenopathy:    No cervical adenopathy.  Neurological: Alert and oriented to person, place, and time. Exhibits normal muscle tone. Gait normal. Coordination normal.  Skin: Skin is warm and dry. No rash noted. Not diaphoretic. No erythema. No pallor.  Psychiatric: Mood, memory and judgment normal.  Vitals reviewed.  LABORATORY DATA: Lab Results  Component Value Date   WBC 6.3 02/13/2024   HGB 11.5 (L) 02/13/2024   HCT 32.8 (L) 02/13/2024   MCV 90.9 02/13/2024   PLT 433 (H) 02/13/2024      Chemistry      Component Value Date/Time   NA 128 (L) 02/13/2024 0911   K 3.9 02/13/2024 0911   CL 89 (L) 02/13/2024 0911   CO2 30 02/13/2024 0911   BUN 9 02/13/2024 0911   CREATININE 0.74 02/13/2024 0911  Component Value Date/Time   CALCIUM 9.4 02/13/2024 0911   ALKPHOS 132 (H) 02/13/2024 0911   AST 18 02/13/2024 0911   ALT 22 02/13/2024 0911   BILITOT 0.6 02/13/2024 0911       RADIOGRAPHIC STUDIES:  No results found.   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     The patient started induction treatment with immunotherapy with ipilimumab  1 Mg/KG and nivolumab  3 mg/KG every 3 weeks for 4 cycles.   First dose was given on October 02, 2020.  He is currently undergoing maintenance treatment with nivolumab  480 Mg IV every 4 weeks.  Status post 30 cycles.   The patient was on  on observation and he was feeling fine with no concerning complaints except for the left flank pain.   He had evidence for disease progression and the patient started treatment with Cabometyx  40 mg p.o. daily on October 22, 2023 status post 4 weeks of treatment.   He had a restaging CT scan in June 2025 which showed disease progression with increase in the size of the left renal mass with extrarenal extension as well as progression of retroperitoneal adenopathy and new nodular thickening of the left adrenal gland suspicious for metastatic disease. There was also development of a small left pleural effusion.    Therefore, Dr. Sherrod changed his treatment to Keytruda  and Lenvatinib . His first cycle of treatment was on 01/30/24.    The patient was seen with Dr. Sherrod today. Dr. Sherrod discussed average 5 year survival with stage IV is 18%. However, unable to give him an exact timeline of his specific duration of survival. Dr. Sherrod discussed that we can refer him to urology/surgery to see if it would help his pain to have some surgical removal of his tumor which is causing left flank pain.   I will place referral.   Labs were reviewed. His sodium is 127. I have refilled his salt tablets to take 2-3x per day.   We will see him in 3 weeks for labs and repeat blood work before undergoing cycle #3.   He is scheduled to establish with palliative care today. I talked to them about his main concerns being related to pain and constipation. Discussed he likely needs to be on bowel regiment to prevent constipation.   Discussed salt water rinses and biotene for his taste changes.   The patient is working with social work regarding his financial and work related stressors.   We will arrange for a restaging CT scan of the CAP prior to  his next cycle of treatment to assess his response to treatment  The patient was advised to call immediately if she has any concerning symptoms in the interval. The patient voices understanding of current disease status and treatment options and is in agreement with the current care plan. All questions were answered. The patient knows to call the clinic with any problems, questions or concerns. We can certainly see the patient much sooner if necessary   No orders of the defined types were placed in this encounter.   Bob Ewing L Bob Cech, PA-C 02/19/24  ADDENDUM: Hematology/Oncology Attending: I had a face-to-face encounter with the patient today.  I  reviewed his records, lab, and recommended his care plan.  This is a very pleasant 53 years old white male with a stage IV clear-cell renal cell carcinoma with rhabdoid features diagnosed in February 2022 with pulmonary involvement as well as mediastinal lymphadenopathy.  He is status post treatment with immunotherapy initially with ipilimumab  and nivolumab  every 3 weeks for 4 cycles followed by maintenance treatment with nivolumab  for completion of 2 years of treatment.  The patient was on observation but unfortunately he started developing disease progression with enlargement of the left kidney as well as adrenal metastasis and pulmonary metastasis. He started treatment with Cabometyx  40 mg p.o. daily but this was discontinued after 3 months of treatment secondary to disease progression.  He is currently on treatment with Keytruda  200 Mg IV every 3 weeks in addition to lenvatinib  20 mg p.o. daily and has been tolerating this treatment fairly well except for the fatigue.  He continues to have pain on the left flank area secondary to the large renal mass. I recommended for the patient to continue his current treatment with Keytruda  and lenvatinib  as planned.  He has a lot of question about his prognosis and I explained to the patient his prognosis  and he understands that he has incurable condition and the 5-year survival for patient with a stage IV renal cell carcinoma is around 18%. I also discussed with the patient referral to urology for discussion of possible left nephrectomy for palliation because of the significant pain from the large renal mass. The patient is interested in this option and we will make the referral. We will see him back for follow-up visit in 3 weeks for evaluation with repeat CT scan of the chest, abdomen and pelvis for restaging of his disease. He was advised to call immediately if he has any concerning symptoms in the interval. The total time spent in the appointment was 30 minutes including review of chart and various tests results, discussions about plan of care and coordination of care plan . Disclaimer: This note was dictated with voice recognition software. Similar sounding words can inadvertently be transcribed and may be missed upon review. Ewing Bob Sherrod, MD

## 2024-02-21 ENCOUNTER — Other Ambulatory Visit (HOSPITAL_COMMUNITY): Payer: Self-pay

## 2024-02-21 ENCOUNTER — Inpatient Hospital Stay

## 2024-02-21 ENCOUNTER — Inpatient Hospital Stay (HOSPITAL_BASED_OUTPATIENT_CLINIC_OR_DEPARTMENT_OTHER): Admitting: Physician Assistant

## 2024-02-21 ENCOUNTER — Inpatient Hospital Stay: Admitting: Nurse Practitioner

## 2024-02-21 ENCOUNTER — Encounter: Payer: Self-pay | Admitting: Internal Medicine

## 2024-02-21 VITALS — BP 134/81 | HR 93 | Temp 97.2°F | Resp 20 | Wt 186.5 lb

## 2024-02-21 DIAGNOSIS — Z5112 Encounter for antineoplastic immunotherapy: Secondary | ICD-10-CM

## 2024-02-21 DIAGNOSIS — E871 Hypo-osmolality and hyponatremia: Secondary | ICD-10-CM | POA: Diagnosis not present

## 2024-02-21 DIAGNOSIS — K5903 Drug induced constipation: Secondary | ICD-10-CM | POA: Diagnosis not present

## 2024-02-21 DIAGNOSIS — R630 Anorexia: Secondary | ICD-10-CM | POA: Diagnosis not present

## 2024-02-21 DIAGNOSIS — C642 Malignant neoplasm of left kidney, except renal pelvis: Secondary | ICD-10-CM

## 2024-02-21 DIAGNOSIS — Z7189 Other specified counseling: Secondary | ICD-10-CM | POA: Diagnosis not present

## 2024-02-21 DIAGNOSIS — Z515 Encounter for palliative care: Secondary | ICD-10-CM | POA: Diagnosis not present

## 2024-02-21 DIAGNOSIS — G893 Neoplasm related pain (acute) (chronic): Secondary | ICD-10-CM

## 2024-02-21 LAB — CBC WITH DIFFERENTIAL (CANCER CENTER ONLY)
Abs Immature Granulocytes: 0.05 K/uL (ref 0.00–0.07)
Basophils Absolute: 0 K/uL (ref 0.0–0.1)
Basophils Relative: 0 %
Eosinophils Absolute: 0 K/uL (ref 0.0–0.5)
Eosinophils Relative: 1 %
HCT: 33.1 % — ABNORMAL LOW (ref 39.0–52.0)
Hemoglobin: 11.5 g/dL — ABNORMAL LOW (ref 13.0–17.0)
Immature Granulocytes: 1 %
Lymphocytes Relative: 6 %
Lymphs Abs: 0.4 K/uL — ABNORMAL LOW (ref 0.7–4.0)
MCH: 31.6 pg (ref 26.0–34.0)
MCHC: 34.7 g/dL (ref 30.0–36.0)
MCV: 90.9 fL (ref 80.0–100.0)
Monocytes Absolute: 0.6 K/uL (ref 0.1–1.0)
Monocytes Relative: 9 %
Neutro Abs: 5.9 K/uL (ref 1.7–7.7)
Neutrophils Relative %: 83 %
Platelet Count: 446 K/uL — ABNORMAL HIGH (ref 150–400)
RBC: 3.64 MIL/uL — ABNORMAL LOW (ref 4.22–5.81)
RDW: 13.6 % (ref 11.5–15.5)
WBC Count: 7 K/uL (ref 4.0–10.5)
nRBC: 0 % (ref 0.0–0.2)

## 2024-02-21 LAB — CMP (CANCER CENTER ONLY)
ALT: 16 U/L (ref 0–44)
AST: 14 U/L — ABNORMAL LOW (ref 15–41)
Albumin: 3.5 g/dL (ref 3.5–5.0)
Alkaline Phosphatase: 111 U/L (ref 38–126)
Anion gap: 9 (ref 5–15)
BUN: 9 mg/dL (ref 6–20)
CO2: 30 mmol/L (ref 22–32)
Calcium: 9.5 mg/dL (ref 8.9–10.3)
Chloride: 88 mmol/L — ABNORMAL LOW (ref 98–111)
Creatinine: 0.75 mg/dL (ref 0.61–1.24)
GFR, Estimated: >60 mL/min (ref >60)
Glucose, Bld: 117 mg/dL — ABNORMAL HIGH (ref 70–99)
Potassium: 4.2 mmol/L (ref 3.5–5.1)
Sodium: 127 mmol/L — ABNORMAL LOW (ref 135–145)
Total Bilirubin: 0.6 mg/dL (ref 0.0–1.2)
Total Protein: 7.5 g/dL (ref 6.5–8.1)

## 2024-02-21 MED ORDER — SODIUM CHLORIDE 0.9 % IV SOLN
200.0000 mg | Freq: Once | INTRAVENOUS | Status: AC
  Administered 2024-02-21: 200 mg via INTRAVENOUS
  Filled 2024-02-21: qty 200

## 2024-02-21 MED ORDER — OXYCODONE HCL 5 MG PO TABS
5.0000 mg | ORAL_TABLET | Freq: Once | ORAL | Status: AC
  Administered 2024-02-21: 5 mg via ORAL
  Filled 2024-02-21: qty 1

## 2024-02-21 MED ORDER — XTAMPZA ER 9 MG PO C12A
9.0000 mg | EXTENDED_RELEASE_CAPSULE | Freq: Two times a day (BID) | ORAL | 0 refills | Status: DC
  Filled 2024-02-21: qty 30, 15d supply, fill #0

## 2024-02-21 MED ORDER — LACTULOSE 20 GM/30ML PO SOLN
30.0000 mL | Freq: Two times a day (BID) | ORAL | 3 refills | Status: DC | PRN
  Filled 2024-02-21: qty 450, 8d supply, fill #0

## 2024-02-21 MED ORDER — SODIUM CHLORIDE 0.9 % IV SOLN: INTRAVENOUS | Status: DC

## 2024-02-21 MED ORDER — SODIUM CHLORIDE 1 G PO TABS: ORAL_TABLET | ORAL | 0 refills | Status: DC

## 2024-02-21 MED ORDER — OXYCODONE HCL ER 10 MG PO T12A
10.0000 mg | EXTENDED_RELEASE_TABLET | Freq: Two times a day (BID) | ORAL | 0 refills | Status: DC
  Filled 2024-02-21: qty 14, 7d supply, fill #0

## 2024-02-21 NOTE — Patient Instructions (Signed)

## 2024-02-21 NOTE — Progress Notes (Signed)
 "    Palliative Medicine [facility]  [phone] Fax:([phone]   Name: Bob Ewing Date: 02/21/2024 MRN: [id number]  DOB: August 27, 1970  Patient Care Team: [name], MD as PCP - General (Internal Medicine) [name], MD as Consulting Physician (Oncology)    REASON FOR CONSULTATION: Bob Ewing is a 53 y.o. 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   The patient started induction treatment with immunotherapy with ipilimumab  1 Mg/KG and nivolumab  3 mg/KG every 3 weeks for 4 cycles.  First dose was given on October 02, 2020.  He is currently undergoing maintenance treatment with nivolumab  480 Mg IV every 4 weeks.  Status post 30 cycles.   The patient was on observation and he was feeling fine with no concerning complaints except for the left flank pain.   He had evidence for disease progression and the patient started treatment with Cabometyx  40 mg p.o. daily on October 22, 2023 status post 4 weeks of treatment.   He had a restaging CT scan in June 2025 which showed disease progression with increase in the size of the left renal mass with extrarenal extension as well as progression of retroperitoneal adenopathy and new nodular thickening of the left adrenal gland suspicious for metastatic disease. There was also development of a small left pleural effusion.    Therefore, Dr. Sherrod changed his treatment to Keytruda  and Lenvatinib . His first cycle of treatment was given 01/31/2024.  Patient tolerated initial treatment with Keytruda   well.  Continues lenvatinib  as prescribed. Also struggling with hyponatremia on salt tabs. Being referred to urology to see if he is a surgical candidate. Being followed by SW as there are several psychosocial issues as well.  Intent of Therapy: Non-Curative / Palliative Intent  Palliative is seeing patient for symptom management and goals of care.    SOCIAL HISTORY:     reports that he has been smoking cigarettes. He has never used smokeless tobacco. He reports current alcohol  use. He reports that he does not use drugs.  ADVANCE DIRECTIVES:  He states he is alone here in the [place] . He does not have a healthcare proxy. He has a friend but states his friend is a naval architect and it would be very hard to get a hold of him if the worst were to happen. He also has a sister but she lives in [place]. No changes to advanced directives made today. He will think about these things going forward.   CODE STATUS: Full code  PAST MEDICAL HISTORY: Past Medical History:  Diagnosis Date   Cancer (HCC)    Hypertension    Stroke (HCC)     PAST SURGICAL HISTORY: No past surgical history on file.  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 from kidney to other site [facility])   09/21/2020 Cancer Staging   Staging form: Kidney, AJCC 8th Edition - Clinical: Stage IV (cT2a, cN0, pM1) - Signed by Amadeo,  Windell SAILOR, MD on 09/21/2020 Histologic grade (G): G4 Histologic grading system: 4 grade system Histologic sub-type: Clear cell Sarcomatoid features: Absent Rhabdoid features: Present Tumor necrosis: Absent Invasion beyond capsule into fat or perisinus tissues: Unknown Venous invasion (V): VX - Cannot be assessed   10/02/2020 - 02/11/2022 Chemotherapy   Patient is on Treatment Plan : RENAL CELL CARCINOMA Nivolumab  + Ipilimumab  q21d /  Nivolumab  q14d     10/02/2020 - 01/12/2023 Chemotherapy   Patient is on Treatment Plan : RENAL CELL CARCINOMA Nivolumab  (3) + Ipilimumab  (1) q21d x 4 cycles  / Nivolumab  (480) q28d     01/31/2024 -  Chemotherapy   Patient is on Treatment Plan : Rrenal Lenvatinib  (20) D1-21 + Pembrolizumab  (200) D1 q21d     Hypercalcemia    ALLERGIES:  has no active allergies.  MEDICATIONS:  Current Outpatient Medications  Medication Sig Dispense Refill   cyclobenzaprine (FLEXERIL) 10 MG tablet Take 10 mg by mouth at bedtime as needed.     hydrochlorothiazide  (HYDRODIURIL ) 25 MG tablet Take 1 tablet (25 mg total) by mouth daily. 90 tablet 0   ibuprofen (ADVIL) 800 MG tablet Take 800 mg by mouth every 6 (six) hours as needed.     lenvatinib  20 mg daily dose (LENVIMA ) 2 x 10 MG capsule Take 2 capsules (20 mg total) by mouth daily. 60 capsule 11   levothyroxine  (SYNTHROID ) 150 MCG tablet Take 1 tablet (150 mcg total) by mouth daily. 30 tablet 1   metFORMIN (GLUCOPHAGE) 500 MG tablet TAKE 1 TABLET BY MOUTH DAILY 90 tablet 1   metoprolol tartrate (LOPRESSOR) 100 MG tablet Take 100 mg by mouth 2 (two) times daily.     ondansetron  (ZOFRAN ) 8 MG tablet Take 1 tablet (8 mg total) by mouth every 8 (eight) hours as needed for nausea or vomiting. 30 tablet 2   oxyCODONE -acetaminophen  (PERCOCET/ROXICET) 5-325 MG tablet Take 1 tablet by mouth every 6 (six) hours as needed for severe pain (pain score 7-10) or moderate pain (pain score 4-6). 30 tablet 0   sodium chloride  1 g tablet Take 1 tablet (1 g total) by mouth 2-3 times daily with meals. 60 tablet 0   No current facility-administered medications for this visit.   Facility-Administered Medications Ordered in Other Visits  Medication Dose Route Frequency Provider Last Rate Last Admin   0.9 %  sodium chloride  infusion   Intravenous Continuous [name], MD 10 mL/hr at 02/21/24 1249 New Bag at 02/21/24 1249   pembrolizumab  (KEYTRUDA ) 200 mg in sodium chloride  0.9  % 50 mL chemo infusion  200 mg Intravenous Once [name], MD        VITAL SIGNS:     02/21/2024   11:22 AM 02/06/2024    8:48 AM 02/06/2024    8:43 AM  Vitals with BMI  Weight 186 lbs 8 oz 200 lbs 11 oz   Systolic 134 146 845  Diastolic 81 92 92  Pulse 93 96     LABS: CBC:    Component Value Date/Time   WBC 7.0 02/21/2024 1117   WBC 10.9 (H) 10/15/2020 0618   HGB 11.5 (L) 02/21/2024 1117   HCT 33.1 (L) 02/21/2024 1117   PLT 446 (H) 02/21/2024 1117   MCV 90.9 02/21/2024 1117   NEUTROABS 5.9 02/21/2024 1117   LYMPHSABS 0.4 (L) 02/21/2024 1117   MONOABS 0.6 02/21/2024 1117   EOSABS 0.0 02/21/2024 1117   BASOSABS 0.0 02/21/2024 1117   Comprehensive Metabolic Panel:    Component  Value Date/Time   NA 127 (L) 02/21/2024 1117   K 4.2 02/21/2024 1117   CL 88 (L) 02/21/2024 1117   CO2 30 02/21/2024 1117   BUN 9 02/21/2024 1117   CREATININE 0.75 02/21/2024 1117   GLUCOSE 117 (H) 02/21/2024 1117   CALCIUM 9.5 02/21/2024 1117   AST 14 (L) 02/21/2024 1117   ALT 16 02/21/2024 1117   ALKPHOS 111 02/21/2024 1117   BILITOT 0.6 02/21/2024 1117   PROT 7.5 02/21/2024 1117   ALBUMIN  3.5 02/21/2024 1117    RADIOGRAPHIC STUDIES: No results found.  PERFORMANCE STATUS (ECOG) : 1, symptomatic but ambulatory   Review of Systems Unless otherwise noted, a complete review of systems is negative.  Physical Exam Constitutional:      General: He is not in acute distress. Pulmonary:     Effort: Pulmonary effort is normal. No respiratory distress.  Skin:    General: Skin is warm and dry.  Neurological:     Mental Status: He is alert.     Comments: Awake, alert, soft spoken  Psychiatric:        Mood and Affect: Affect is flat.        Behavior: Behavior normal. Behavior is cooperative.     IMPRESSION:  I introduced myself, Nikki NP, Maygan RN, and Palliative's role in collaboration with the oncology team. Concept of Palliative Care was introduced as specialized medical care  for people and their families living with serious illness.  It focuses on providing relief from the symptoms and stress of a serious illness.  The goal is to improve quality of life for both the patient and the family. Values and goals of care important to patient and family were attempted to be elicited.   Discussed the use of AI scribe software for clinical note transcription with the patient, who gave verbal consent to proceed.  History of Present Illness Bob Ewing is a 53 year old male with metastatic renal cell carcinoma is being seen for symptom management and GOC. His main concerns are pain and constipation.   He experiences significant pain primarily on the left side of his body, radiating across his abdomen and into his lower pelvic area. The pain is persistent, affecting his ability to sleep and perform daily activities. He uses oxycodone  for pain relief, which provides temporary relief for about two to three hours, but the pain returns thereafter.  He has significant constipation and reports using pain medication. He has been using various over-the-counter remedies, including anti-gas medications, Dulcolax, and suppositories but finds them only partially effective. He last had a bowel movement yesterday, which was difficult despite using a suppository. He has not tried Miralax but is not interested in this.   He has a decreased appetite and significant weight loss (01/30/24: 205 lbs, 02/21/24: 186 lbs). He eats very little, often just an egg or small amounts of food, and avoids foods that cause gas, such as beans (he likes beans but states they cause him significant discomfort). He dislikes chicken and meat and prefers bananas and yogurt. He has not been using Zofran  for nausea as he does not feel the need to eat much.  He lives alone and expresses feelings of loneliness, having been separated from his wife due to war and having no family nearby except a sister in [place]. He is also going  through a great deal. 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 He  is understandably concerned about finances etc going forward as this will likely impact his insurance coverage and his ability to pay for necessities. He uses [employer] for transportation as his car is not functional.  He initially reported feeling down but then denies any significant depression, as he enjoys watching TV and playing PlayStation, but he reports difficulty sleeping due to pain.  No breathing concerns reported.   We discussed his current illness and what it means in the larger context of on-going co-morbidities. Natural disease trajectory and expectations were discussed.  I discussed the importance of continued conversation with people in his life and their medical providers regarding overall plan of care and treatment options, ensuring decisions are within the context of the patients values and GOCs.  Established therapeutic relationship. Education provided on palliative's role in collaboration with their Oncology/Radiation team.  Assessment & Plan Chronic pain Chronic pain primarily on the left side, radiating to the pelvic area. Current pain management with oxycodone  is insufficient, providing relief for only 2-3 hours. Pain is impacting daily activities and sleep. Concerns about potential dependence on opioids were addressed, emphasizing the need for controlled pain management. - Prescribe Xtampza  9 mg twice daily for long-acting pain control. - Continue Percocet for breakthrough pain as needed.  Constipation Chronic constipation likely exacerbated by opioid use for pain management. Current regimen includes various over-the-counter anti-gas and constipation medications, but relief is inadequate. - Prescribe lactulose  to be taken regularly to manage constipation. - Encourage regular use of prescribed medication to prevent constipation. - Diet with adequate  fluid/fiber   Loss of appetite and weight loss -Significant loss of appetite and weight loss  -Appetite suppression likely related treatment but pain and constipation likely also play a role. -Manage pain/constipation - Encourage consumption of calorie-dense foods such as full-fat Greek yogurt and protein drinks. - Avoid high-gas foods like beans and broccoli. - Consider referral to a dietitian if appetite does not improve (declined referral today).  Goals of Care Discussed the importance of advance directives and having a designated decision-maker in case of health deterioration. Currently lacks a clear plan due to personal circumstances and lack of local support. - Encourage consideration of advance directives and identifying a healthcare proxy. - Discuss potential support group participation for social support.  Alteration of social drivers of health:  - experiences significant stress due to loss of work related to illness, concerned about insurance coverage, ability to pay for life necessities, lack of transportation, etc -Also experiences significant loneliness, will consider support groups - social work aware and plans to follow up and provide assistance   Follow-up Plan to reassess pain management and overall condition in the near future. - Schedule follow-up appointment in 1-2 weeks to evaluate the effectiveness of the new pain management regimen.   Patient expressed understanding and was in agreement with this plan. He also understands that He can call the clinic at any time with any questions, concerns, or complaints.   Thank you for your referral and allowing Palliative to assist in Mr. Kohan Azizi care.   Number and complexity of problems addressed: HIGH - 1 or more chronic illnesses with SEVERE exacerbation, progression, or side effects of treatment - advanced cancer, pain. Any controlled substances utilized were prescribed in the context of palliative care.  Visit  consisted of counseling and education dealing with the complex and emotionally intense issues of symptom management and palliative care in the setting of serious and potentially life-threatening illness.  Signed by: Laymon Pinal, DNP, AGNP-c,  ACHPN Levon Borer, AGPCNP-BC Palliative Medicine Team/[facility]    "

## 2024-02-21 NOTE — Progress Notes (Unsigned)
 This RN obtained a PA for pt long acting pain medication. Pharmacy and pt both made aware, no further needs at this time.

## 2024-02-22 ENCOUNTER — Encounter: Payer: Self-pay | Admitting: Internal Medicine

## 2024-02-22 ENCOUNTER — Encounter: Payer: Self-pay | Admitting: Nurse Practitioner

## 2024-02-22 ENCOUNTER — Inpatient Hospital Stay: Admitting: Licensed Clinical Social Worker

## 2024-02-22 ENCOUNTER — Other Ambulatory Visit: Payer: Self-pay

## 2024-02-22 ENCOUNTER — Other Ambulatory Visit (HOSPITAL_COMMUNITY): Payer: Self-pay

## 2024-02-22 DIAGNOSIS — G893 Neoplasm related pain (acute) (chronic): Secondary | ICD-10-CM

## 2024-02-22 DIAGNOSIS — K5903 Drug induced constipation: Secondary | ICD-10-CM

## 2024-02-22 DIAGNOSIS — C642 Malignant neoplasm of left kidney, except renal pelvis: Secondary | ICD-10-CM

## 2024-02-22 MED ORDER — OXYCODONE-ACETAMINOPHEN 5-325 MG PO TABS: 1.0000 | ORAL_TABLET | Freq: Four times a day (QID) | ORAL | 0 refills | Status: DC | PRN

## 2024-02-22 MED ORDER — LACTULOSE 20 GM/30ML PO SOLN: 30.0000 mL | Freq: Two times a day (BID) | ORAL | 3 refills | Status: DC | PRN

## 2024-02-22 MED ORDER — XTAMPZA ER 9 MG PO C12A: 9.0000 mg | EXTENDED_RELEASE_CAPSULE | Freq: Two times a day (BID) | ORAL | 0 refills | Status: DC

## 2024-02-22 NOTE — Progress Notes (Signed)
 Specialty Pharmacy Refill Coordination Note  Bob Ewing is a 53 y.o. male contacted today regarding refills of specialty medication(s) Lenvatinib  Mesylate (LENVIMA )   Patient requested Pickup at [facility] at [place] date: 02/23/24   Medication will be filled on 02/23/24.

## 2024-02-22 NOTE — Progress Notes (Signed)
 [facility] CSW Progress Note  Clinical Child psychotherapist contacted patient by phone to follow-up on financial concerns.    Interventions: CSW spoke with pt regarding his financial concerns.  Pt does not at this time qualify for the [employer].  CSW encouraged pt to allow for referral to be sent to the [facility] to apply for SSDI.  Pt verbalized agreement for referral to be sent and it was sent by CSW.  CSW also spoke to pt about long term planning.  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  CSW also informed pt if he receives an eviction notice [employer] may be able to assist w/ this months rent to prevent the eviction.  Pt instructed to contact CSW if he gets an eviction notice.      Follow Up Plan:  Patient will contact CSW with any support or resource needs    Bob JONELLE Manna, LCSW Clinical Social Worker [facility]

## 2024-02-22 NOTE — Telephone Encounter (Signed)
 Pt medications will not be ready for pick up at preferred pharmacy until tomorrow, pt does not have transportation then. Medication re-routed to secondary pharmacy, canceled out of original pharmacy's orders. Pt called and notified. No further needs at this time.

## 2024-02-23 ENCOUNTER — Other Ambulatory Visit (HOSPITAL_COMMUNITY): Payer: Self-pay

## 2024-02-23 ENCOUNTER — Other Ambulatory Visit: Payer: Self-pay

## 2024-02-23 NOTE — Progress Notes (Signed)
 Faxed referral to [facility] with confirmation at [phone].

## 2024-02-26 ENCOUNTER — Other Ambulatory Visit: Payer: Self-pay | Admitting: Physician Assistant

## 2024-02-26 ENCOUNTER — Telehealth: Payer: Self-pay

## 2024-02-26 DIAGNOSIS — I159 Secondary hypertension, unspecified: Secondary | ICD-10-CM

## 2024-02-26 MED ORDER — AMLODIPINE BESYLATE 5 MG PO TABS: 5.0000 mg | ORAL_TABLET | Freq: Every day | ORAL | 2 refills | Status: DC

## 2024-02-26 NOTE — Telephone Encounter (Signed)
 Pt advised of new prescription that has been sent to his pharmacy.  Telephone OV for 7/30 confirmed with pt

## 2024-02-26 NOTE — Telephone Encounter (Signed)
-----   Message from Cassandra L Heilingoetter sent at 02/26/2024  1:02 PM EDT ----- Regarding: FW: Medication Adjustment Due to Hyponatremia Walterine Fees, I am going to change his BP med to norvasc  5 mg to see if that helps his sodium problem. Can you let him know to stop the hydrochlorothiazide  and take norvasc  instead? ----- Message ----- From: Cleotilde Laymon HERO, NP Sent: 02/22/2024   9:00 AM EDT To: [name], MD; Cassandra L Heilingoett# Subject: Medication Adjustment Due to Hyponatremia      Good morning,  [place] with palliative (following Nikki). So sorry to bother you both as I know you are so very busy! I saw Mr. Duris yesterday and noticed he has been having some issues with hyponatremia. In review of his meds it looks like he is on hydrochlorothiazide  which can contribute to low sodium. It appears we were refilling this medication starting in May while waiting for him to follow up with internal medicine. He has a referral to establish a PCP in place but unfortunately, he has not followed up (he has some social issues going on which is likely why he has not followed up). I wondered if it would be ok to discontinue the hydrochlorothiazide  and consider switching to amlodipine  or another antihypertensive while he awaits follow up with primary care to see if this can help with his hyponatremia (NKDA). I spoke with Cassie as well either of us  are comfortable placing the order if it is ok with you Dr. Sherrod.  Thanks for considering (and for all you both do),  [place]

## 2024-02-27 ENCOUNTER — Encounter

## 2024-02-28 ENCOUNTER — Inpatient Hospital Stay: Admitting: Nurse Practitioner

## 2024-02-28 ENCOUNTER — Other Ambulatory Visit: Payer: Self-pay

## 2024-02-28 ENCOUNTER — Inpatient Hospital Stay: Admitting: Licensed Clinical Social Worker

## 2024-02-28 ENCOUNTER — Telehealth: Payer: Self-pay | Admitting: Medical Oncology

## 2024-02-28 DIAGNOSIS — R630 Anorexia: Secondary | ICD-10-CM

## 2024-02-28 DIAGNOSIS — C642 Malignant neoplasm of left kidney, except renal pelvis: Secondary | ICD-10-CM

## 2024-02-28 DIAGNOSIS — Z515 Encounter for palliative care: Secondary | ICD-10-CM

## 2024-02-28 DIAGNOSIS — K5900 Constipation, unspecified: Secondary | ICD-10-CM | POA: Diagnosis not present

## 2024-02-28 DIAGNOSIS — G893 Neoplasm related pain (acute) (chronic): Secondary | ICD-10-CM

## 2024-02-28 NOTE — Telephone Encounter (Signed)
 Needs transportation set up from cancer center. Team message sent to [facility].  Resource support requested-[employer]  (p) [phone]. This  organization told pt to have Social worker to call on his behalf.

## 2024-02-28 NOTE — Progress Notes (Signed)
 [facility] CSW Progress Note  Clinical Child psychotherapist contacted patient by phone to follow-up on request to contact the [employer].    Interventions: CSW attempted to contact the above organization.  The telephone number goes to a voicemail which requests messages not be left but provides an email to send inquiries.  CSW sent an email to the address provided.  An automatic response came back informing not all emails will receive a response.  CSW attempted to go on the website and access an application for financial assistance; however, application is blocked on the hospital computers.  Pt informed of the above.  CSW will continue to attempt to contact the organization to apply of behalf of pt.        Follow Up Plan:  CSW will follow up w/ pt if able to contact the above organization.    Devere JONELLE Manna, LCSW Clinical Social Worker [facility]

## 2024-02-28 NOTE — Progress Notes (Signed)
 Palliative Medicine [facility]  [phone]) [phone] [phone]) [phone]   Name: Bob Ewing Date: 02/28/2024 MRN: [id number]  DOB: 01/01/1971  Patient Care Team: [name], MD as PCP - General (Internal Medicine) [name], MD as Consulting Physician (Oncology)    I connected with  [employer] on 02/28/24 by a video enabled telemedicine application and verified that I am speaking with the correct person using two identifiers.   I discussed the limitations of evaluation and management by telemedicine. The patient expressed understanding and agreed to proceed.   INTERVAL HISTORY: Gable Odonohue is a 53 y.o. 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    The patient started induction treatment with immunotherapy with ipilimumab  1 Mg/KG and nivolumab  3 mg/KG every 3 weeks for 4 cycles.  First dose was given on October 02, 2020.  He is currently undergoing maintenance treatment with nivolumab  480 Mg IV every 4 weeks.  Status post 30 cycles.    The patient was on observation and he was feeling fine with no concerning complaints except for the left flank pain.   He had evidence for disease progression and the patient started treatment with Cabometyx  40 mg p.o. daily on October 22, 2023 status post 4 weeks of treatment.    He had a restaging CT scan in June 2025 which showed disease progression with increase in the size of the left renal mass with extrarenal extension as well as progression of retroperitoneal adenopathy and new nodular  thickening of the left adrenal gland suspicious for metastatic disease. There was also development of a small left pleural effusion.     Therefore, Dr. Sherrod changed his treatment to Keytruda  and Lenvatinib . His first cycle of treatment was given 01/31/2024.  Patient tolerated initial treatment with Keytruda  well.  Continues lenvatinib  as prescribed. Also struggling with hyponatremia on salt tabs. Being referred to urology to see if he is a surgical candidate. Being followed by SW as there are several psychosocial issues as well.   Intent of Therapy: Non-Curative / Palliative Intent  SOCIAL HISTORY:     reports that he has been smoking cigarettes. He has never used smokeless tobacco. He reports current alcohol use. He reports that he does not use drugs.  ADVANCE DIRECTIVES:  Not on file, full code full scope, does not wish to make any changes to advance directives at this time  CODE STATUS: Full code  PAST MEDICAL HISTORY: Past Medical History:  Diagnosis Date   Cancer (HCC)    Hypertension    Stroke (HCC)     ALLERGIES:  has no active allergies.  MEDICATIONS:  Current Outpatient Medications  Medication Sig Dispense Refill   amLODipine  (NORVASC ) 5 MG tablet Take 1 tablet (5 mg total) by mouth daily. 30 tablet 2   cyclobenzaprine (FLEXERIL) 10 MG tablet Take 10 mg by mouth at bedtime as needed.     hydrochlorothiazide  (HYDRODIURIL ) 25 MG tablet Take 1 tablet (25 mg total) by mouth daily. 90 tablet 0   ibuprofen (ADVIL) 800 MG tablet Take 800 mg by mouth every  6 (six) hours as needed.     Lactulose  20 GM/30ML SOLN Take 30 mLs (20 g total) by mouth 2 (two) times daily as needed (take 30 mL up to twice daily as needed for constipation). 450 mL 3   lenvatinib  20 mg daily dose (LENVIMA ) 2 x 10 MG capsule Take 2 capsules (20 mg total) by mouth daily. 60 capsule 11   levothyroxine  (SYNTHROID ) 150 MCG tablet Take 1 tablet (150 mcg total) by mouth daily. 30 tablet 1   metFORMIN (GLUCOPHAGE)  500 MG tablet TAKE 1 TABLET BY MOUTH DAILY 90 tablet 1   metoprolol tartrate (LOPRESSOR) 100 MG tablet Take 100 mg by mouth 2 (two) times daily.     ondansetron  (ZOFRAN ) 8 MG tablet Take 1 tablet (8 mg total) by mouth every 8 (eight) hours as needed for nausea or vomiting. 30 tablet 2   oxyCODONE  ER (XTAMPZA  ER) 9 MG C12A Take 1 capsule (9 mg) by mouth every 12 (twelve) hours. 30 capsule 0   oxyCODONE -acetaminophen  (PERCOCET/ROXICET) 5-325 MG tablet Take 1 tablet by mouth every 6 (six) hours as needed for severe pain (pain score 7-10) or moderate pain (pain score 4-6). 60 tablet 0   sodium chloride  1 g tablet Take 1 tablet (1 g total) by mouth 2-3 times daily with meals. 60 tablet 0   No current facility-administered medications for this visit.    VITAL SIGNS: There were no vitals taken for this visit. There were no vitals filed for this visit.  Estimated body mass index is 29.21 kg/m as calculated from the following:   Height as of 11/30/23: 5' 7 (1.702 m).   Weight as of 02/21/24: 186 lb 8 oz (84.6 kg).   PERFORMANCE STATUS (ECOG) : 1 - Symptomatic but completely ambulatory   IMPRESSION: Discussed the use of AI scribe software for clinical note transcription with the patient, who gave verbal consent to proceed.  History of Present Illness Samyak Sackmann is a 53 year old male with cancer who presents with uncontrolled pain and constipation.  He experiences persistent, multifaceted pain, including muscle pain, nerve pain in the pelvis, gas pain in the abdomen, and a stabbing sensation in the muscles. The pain is constant and severe, impacting his ability to walk and work. He is currently taking oxycodone  ER every twelve hours and oxycodone  with acetaminophen  for breakthrough pain, but finds the relief insufficient as the pain returns after four to five hours.  He is experiencing constipation, which has been somewhat alleviated by lactulose , allowing for more regular bowel movements. However,  he still experiences gas pain, which he attributes to the constipation.  He reports a poor appetite, stating he can only manage small amounts of food, such as 'one egg and at night something.' He has difficulty consuming meals like rice and meat. He mentions low blood sugar and swelling in his feet, particularly the left foot, which he attributes to low protein intake and prolonged sitting. He is trying to increase his protein intake with foods like eggs and yogurt.  He has not been able to pick up his new blood pressure medication, amlodipine , due to transportation issues, and is still taking hydrochlorothiazide . He mentions that his feet are swollen, especially the left one.      Assessment and Plan Assessment & Plan Cancer-related and musculoskeletal pain management Chronic pain due to cancer and musculoskeletal issues, including cancer-related pain, muscle pain, nerve pain in the pelvis, and gas-related abdominal pain. Current pain management with oxycodone  ER and  oxycodone  with acetaminophen  is not fully effective, with breakthrough pain every 4-5 hours. Concerns about muscle loss and nerve pain in the pelvis. - Instruct to take oxycodone  ER (Xtampza ) every 12 hours consistently. - Continue oxycodone  with acetaminophen  (Percocet) for breakthrough pain every 6 hours as needed. - Consider referral to physical therapy for muscle pain management, with the possibility of home PT if transportation is an issue.  Constipation Constipation managed with lactulose , improving bowel movements but not completely resolving the issue. May contribute to abdominal discomfort and gas pain. - Continue lactulose  for constipation management.  Anorexia and abnormal weight loss Poor appetite and difficulty eating. Attempts to eat small, frequent meals with high-protein foods like eggs and yogurt. Difficulty consuming meat and other protein-rich foods. Low blood sugar and swelling in feet may be related to  inadequate protein intake. - Encourage small, frequent meals with high-protein foods. - Consider referral to a dietitian for nutritional support and meal supplements like Ensure or Boost.  Bilateral lower extremity edema, left worse than right Bilateral lower extremity edema, more pronounced on the left side. Possible causes include low protein intake and prolonged sitting with legs hanging down. Left leg pain may be related to nerve issues in the thigh. - Elevate legs when sitting to reduce edema. - Increase protein intake to address potential nutritional deficiencies. - Stop hydrochlorothiazide  and start amlodipine  for blood pressure management.  Patient expressed understanding and was in agreement with this plan. He also understands that He can call the clinic at any time with any questions, concerns, or complaints.   Any controlled substances utilized were prescribed in the context of palliative care. PDMP has been reviewed.   Visit consisted of counseling and education dealing with the complex and emotionally intense issues of symptom management and palliative care in the setting of serious and potentially life-threatening illness.  Levon Borer, AGPCNP-BC  Palliative Medicine Team/[facility]

## 2024-02-29 ENCOUNTER — Encounter: Payer: Self-pay | Admitting: Internal Medicine

## 2024-02-29 ENCOUNTER — Encounter: Payer: Self-pay | Admitting: Nurse Practitioner

## 2024-03-04 ENCOUNTER — Inpatient Hospital Stay: Attending: Oncology | Admitting: Licensed Clinical Social Worker

## 2024-03-04 DIAGNOSIS — R600 Localized edema: Secondary | ICD-10-CM | POA: Insufficient documentation

## 2024-03-04 DIAGNOSIS — R590 Localized enlarged lymph nodes: Secondary | ICD-10-CM | POA: Insufficient documentation

## 2024-03-04 DIAGNOSIS — R634 Abnormal weight loss: Secondary | ICD-10-CM | POA: Insufficient documentation

## 2024-03-04 DIAGNOSIS — C7800 Secondary malignant neoplasm of unspecified lung: Secondary | ICD-10-CM | POA: Insufficient documentation

## 2024-03-04 DIAGNOSIS — L989 Disorder of the skin and subcutaneous tissue, unspecified: Secondary | ICD-10-CM | POA: Insufficient documentation

## 2024-03-04 DIAGNOSIS — F17210 Nicotine dependence, cigarettes, uncomplicated: Secondary | ICD-10-CM | POA: Insufficient documentation

## 2024-03-04 DIAGNOSIS — E871 Hypo-osmolality and hyponatremia: Secondary | ICD-10-CM | POA: Insufficient documentation

## 2024-03-04 DIAGNOSIS — J90 Pleural effusion, not elsewhere classified: Secondary | ICD-10-CM | POA: Insufficient documentation

## 2024-03-04 DIAGNOSIS — Z5112 Encounter for antineoplastic immunotherapy: Secondary | ICD-10-CM | POA: Insufficient documentation

## 2024-03-04 DIAGNOSIS — R630 Anorexia: Secondary | ICD-10-CM | POA: Insufficient documentation

## 2024-03-04 DIAGNOSIS — K5900 Constipation, unspecified: Secondary | ICD-10-CM | POA: Insufficient documentation

## 2024-03-04 DIAGNOSIS — Z79620 Long term (current) use of immunosuppressive biologic: Secondary | ICD-10-CM | POA: Insufficient documentation

## 2024-03-04 DIAGNOSIS — G893 Neoplasm related pain (acute) (chronic): Secondary | ICD-10-CM | POA: Insufficient documentation

## 2024-03-04 DIAGNOSIS — R530 Neoplastic (malignant) related fatigue: Secondary | ICD-10-CM | POA: Insufficient documentation

## 2024-03-04 DIAGNOSIS — E1140 Type 2 diabetes mellitus with diabetic neuropathy, unspecified: Secondary | ICD-10-CM | POA: Insufficient documentation

## 2024-03-04 DIAGNOSIS — C642 Malignant neoplasm of left kidney, except renal pelvis: Secondary | ICD-10-CM | POA: Insufficient documentation

## 2024-03-04 DIAGNOSIS — R5381 Other malaise: Secondary | ICD-10-CM | POA: Insufficient documentation

## 2024-03-04 DIAGNOSIS — R262 Difficulty in walking, not elsewhere classified: Secondary | ICD-10-CM | POA: Insufficient documentation

## 2024-03-04 NOTE — Progress Notes (Signed)
 [facility] CSW Progress Note  Clinical Child psychotherapist contacted patient by phone to follow-up on financial concerns.    Interventions: Pt received an email from the [facility]  w/ instructions to apply online.  The application is a googledoc which can not be accessed by the hospital's computer system.  CSW sent an email to [facility] requesting a PDF version of the application be sent.  Pt updated on the above.  Pt also informed an application was sent to the [facility] [employer] on his behalf.  Pt requesting CSW explore [employer] to see if a grant may be available.  CSW to research and will follow up w/ pt.  Pt's rent is due tomorrow.  CSW instructed if he receives an eviction notice he should reach out to [employer] and the [employer] as they may have funding to prevent an eviction.        Follow Up Plan:  Patient will contact CSW with any support or resource needs    Bob JONELLE Manna, LCSW Clinical Social Worker [facility]

## 2024-03-05 ENCOUNTER — Inpatient Hospital Stay: Admitting: Licensed Clinical Social Worker

## 2024-03-05 DIAGNOSIS — C642 Malignant neoplasm of left kidney, except renal pelvis: Secondary | ICD-10-CM

## 2024-03-05 NOTE — Progress Notes (Signed)
 [facility] CSW Progress Note  Clinical Child psychotherapist contacted patient by phone to follow-up on grants.    Interventions: CSW spoke w/ pt regarding the documentation that will need to be submitted to apply for the [employer].  Pt to email necessary documentation and CSW will submit on behalf of pt.        Follow Up Plan:  Patient will contact CSW with any support or resource needs    Bob JONELLE Manna, LCSW Clinical Social Worker [facility]

## 2024-03-07 ENCOUNTER — Inpatient Hospital Stay: Admitting: Licensed Clinical Social Worker

## 2024-03-07 ENCOUNTER — Other Ambulatory Visit: Payer: Self-pay | Admitting: Nurse Practitioner

## 2024-03-07 ENCOUNTER — Telehealth: Payer: Self-pay

## 2024-03-07 ENCOUNTER — Telehealth: Payer: Self-pay | Admitting: *Deleted

## 2024-03-07 DIAGNOSIS — C642 Malignant neoplasm of left kidney, except renal pelvis: Secondary | ICD-10-CM

## 2024-03-07 DIAGNOSIS — Z515 Encounter for palliative care: Secondary | ICD-10-CM

## 2024-03-07 DIAGNOSIS — G893 Neoplasm related pain (acute) (chronic): Secondary | ICD-10-CM

## 2024-03-07 MED ORDER — OXYCODONE-ACETAMINOPHEN 10-325 MG PO TABS: 1.0000 | ORAL_TABLET | Freq: Four times a day (QID) | ORAL | 0 refills | Status: DC | PRN

## 2024-03-07 MED ORDER — XTAMPZA ER 13.5 MG PO C12A: 13.5000 mg | EXTENDED_RELEASE_CAPSULE | Freq: Two times a day (BID) | ORAL | 0 refills | Status: DC

## 2024-03-07 NOTE — Telephone Encounter (Signed)
 Received notification from Tammi, RN, that patient was experiencing increased pain. This RN called Mr.Dunleavy to assess pain. Mr.Privette reports significant pain in his back and legs, for which he takes oxycodone - acetaminophen  5-325mg  and xtampza  9mg , and it is not providing adequate relief. Patient is taking the xtampza  as prescribed but is taking 2 of the oxycodone -acetaminophen  tablets and at times 3 tablets to help control pain. Per Levon, NP, pt to increase to 13.5mg  of Xtampza  and oxycodone -acetaminophen  was increased to 10mg  tablets. Patient educated on the need to take medications as prescribed and the risks associated with taking medications more than prescribed. Mr.Keena verbalized understanding. Patient also stated that he has developed an open wound on his scrotum from scratching. Patient reports that there is no rash or blister in the area and that it was just itchy. Mr.Maulding reports that the area is small, could not approximate size, and has happened before on his hand after infusion. Again, RN asked Mr.Bogden if there was any rash or blisters or hives in the area, he denied. Per Palliative NP recommendation patient advised to use A&D ointment and Lotrimin the area and to keep it clean and dry and to use clean underwear. Mr.Najiim verbalized understanding of education and will call with any concerns or worsening symptoms. Oncology made aware of medication changes and patient call. No further needs at this time.

## 2024-03-07 NOTE — Telephone Encounter (Signed)
 Bob Ewing called and states he is having bad pain from his kidney/back down his leg. My leg feels numb. Has been taking Oxycodone  5 mg, 2 tablets every 6 hours rather than 1 tablet. Palliative Care to call to discuss with him.  He is also asking about his BP medication. Clarified with Cassie. He was changed to Norvasc  and is to stop hydrochlorothiazide . Pt verbalized understanding.   Gave him the phone number to [facility] as he wanted to check on his appt.

## 2024-03-07 NOTE — Progress Notes (Signed)
 [facility] CSW Progress Note  Interventions: CSW submitted all documents requested by the [employer] to apply for a grant on behalf of pt.  Pt cc'ed on submission.         Follow Up Plan:  Patient will contact CSW with any support or resource needs    Devere JONELLE Manna, LCSW Clinical Social Worker [facility]

## 2024-03-12 ENCOUNTER — Inpatient Hospital Stay: Admitting: Licensed Clinical Social Worker

## 2024-03-12 DIAGNOSIS — C642 Malignant neoplasm of left kidney, except renal pelvis: Secondary | ICD-10-CM

## 2024-03-12 NOTE — Progress Notes (Signed)
 [facility] CSW Progress Note  Clinical Child psychotherapist contacted patient by phone to follow-up on financial concerns.    Interventions: CSW received an email from the [employer] who requested a copy of pt's lease in order to consider paying rent.  CSW informed pt who will try to obtain a copy of the lease.  Once received CSW to submit to the [employer] on behalf of pt.        Follow Up Plan:  Patient will contact CSW with any support or resource needs    Devere JONELLE Manna, LCSW Clinical Social Worker [facility]

## 2024-03-13 ENCOUNTER — Other Ambulatory Visit: Payer: Self-pay | Admitting: Internal Medicine

## 2024-03-13 ENCOUNTER — Other Ambulatory Visit: Admitting: Licensed Clinical Social Worker

## 2024-03-13 ENCOUNTER — Encounter

## 2024-03-13 ENCOUNTER — Inpatient Hospital Stay: Admitting: Nurse Practitioner

## 2024-03-13 ENCOUNTER — Encounter: Payer: Self-pay | Admitting: Nurse Practitioner

## 2024-03-13 ENCOUNTER — Inpatient Hospital Stay

## 2024-03-13 ENCOUNTER — Inpatient Hospital Stay: Admitting: Internal Medicine

## 2024-03-13 VITALS — BP 119/76 | HR 94 | Temp 98.4°F | Resp 20 | Ht 67.0 in

## 2024-03-13 VITALS — Wt 174.0 lb

## 2024-03-13 DIAGNOSIS — Z7189 Other specified counseling: Secondary | ICD-10-CM

## 2024-03-13 DIAGNOSIS — G893 Neoplasm related pain (acute) (chronic): Secondary | ICD-10-CM | POA: Diagnosis not present

## 2024-03-13 DIAGNOSIS — R530 Neoplastic (malignant) related fatigue: Secondary | ICD-10-CM

## 2024-03-13 DIAGNOSIS — R634 Abnormal weight loss: Secondary | ICD-10-CM

## 2024-03-13 DIAGNOSIS — R590 Localized enlarged lymph nodes: Secondary | ICD-10-CM | POA: Diagnosis not present

## 2024-03-13 DIAGNOSIS — R630 Anorexia: Secondary | ICD-10-CM

## 2024-03-13 DIAGNOSIS — C642 Malignant neoplasm of left kidney, except renal pelvis: Secondary | ICD-10-CM

## 2024-03-13 DIAGNOSIS — R600 Localized edema: Secondary | ICD-10-CM | POA: Diagnosis not present

## 2024-03-13 DIAGNOSIS — K5903 Drug induced constipation: Secondary | ICD-10-CM

## 2024-03-13 DIAGNOSIS — Z79620 Long term (current) use of immunosuppressive biologic: Secondary | ICD-10-CM | POA: Diagnosis not present

## 2024-03-13 DIAGNOSIS — E871 Hypo-osmolality and hyponatremia: Secondary | ICD-10-CM

## 2024-03-13 DIAGNOSIS — Z515 Encounter for palliative care: Secondary | ICD-10-CM | POA: Diagnosis not present

## 2024-03-13 DIAGNOSIS — R262 Difficulty in walking, not elsewhere classified: Secondary | ICD-10-CM | POA: Diagnosis not present

## 2024-03-13 DIAGNOSIS — Z5112 Encounter for antineoplastic immunotherapy: Secondary | ICD-10-CM | POA: Diagnosis present

## 2024-03-13 DIAGNOSIS — E1140 Type 2 diabetes mellitus with diabetic neuropathy, unspecified: Secondary | ICD-10-CM | POA: Diagnosis not present

## 2024-03-13 DIAGNOSIS — F17210 Nicotine dependence, cigarettes, uncomplicated: Secondary | ICD-10-CM | POA: Diagnosis not present

## 2024-03-13 DIAGNOSIS — J90 Pleural effusion, not elsewhere classified: Secondary | ICD-10-CM | POA: Diagnosis not present

## 2024-03-13 DIAGNOSIS — K5900 Constipation, unspecified: Secondary | ICD-10-CM | POA: Diagnosis not present

## 2024-03-13 DIAGNOSIS — C7800 Secondary malignant neoplasm of unspecified lung: Secondary | ICD-10-CM | POA: Diagnosis not present

## 2024-03-13 DIAGNOSIS — R5381 Other malaise: Secondary | ICD-10-CM | POA: Diagnosis not present

## 2024-03-13 DIAGNOSIS — L989 Disorder of the skin and subcutaneous tissue, unspecified: Secondary | ICD-10-CM | POA: Diagnosis not present

## 2024-03-13 LAB — CBC WITH DIFFERENTIAL (CANCER CENTER ONLY)
Abs Immature Granulocytes: 0.04 K/uL (ref 0.00–0.07)
Basophils Absolute: 0 K/uL (ref 0.0–0.1)
Basophils Relative: 0 %
Eosinophils Absolute: 0.1 K/uL (ref 0.0–0.5)
Eosinophils Relative: 1 %
HCT: 29.8 % — ABNORMAL LOW (ref 39.0–52.0)
Hemoglobin: 10.1 g/dL — ABNORMAL LOW (ref 13.0–17.0)
Immature Granulocytes: 1 %
Lymphocytes Relative: 6 %
Lymphs Abs: 0.4 K/uL — ABNORMAL LOW (ref 0.7–4.0)
MCH: 30.8 pg (ref 26.0–34.0)
MCHC: 33.9 g/dL (ref 30.0–36.0)
MCV: 90.9 fL (ref 80.0–100.0)
Monocytes Absolute: 0.6 K/uL (ref 0.1–1.0)
Monocytes Relative: 9 %
Neutro Abs: 5.4 K/uL (ref 1.7–7.7)
Neutrophils Relative %: 83 %
Platelet Count: 338 K/uL (ref 150–400)
RBC: 3.28 MIL/uL — ABNORMAL LOW (ref 4.22–5.81)
RDW: 14.4 % (ref 11.5–15.5)
WBC Count: 6.4 K/uL (ref 4.0–10.5)
nRBC: 0 % (ref 0.0–0.2)

## 2024-03-13 LAB — CMP (CANCER CENTER ONLY)
ALT: 16 U/L (ref 0–44)
AST: 20 U/L (ref 15–41)
Albumin: 3.6 g/dL (ref 3.5–5.0)
Alkaline Phosphatase: 130 U/L — ABNORMAL HIGH (ref 38–126)
Anion gap: 9 (ref 5–15)
BUN: 6 mg/dL (ref 6–20)
CO2: 29 mmol/L (ref 22–32)
Calcium: 10.5 mg/dL — ABNORMAL HIGH (ref 8.9–10.3)
Chloride: 87 mmol/L — ABNORMAL LOW (ref 98–111)
Creatinine: 0.55 mg/dL — ABNORMAL LOW (ref 0.61–1.24)
GFR, Estimated: >60 mL/min (ref >60)
Glucose, Bld: 95 mg/dL (ref 70–99)
Potassium: 4.1 mmol/L (ref 3.5–5.1)
Sodium: 125 mmol/L — ABNORMAL LOW (ref 135–145)
Total Bilirubin: 0.5 mg/dL (ref 0.0–1.2)
Total Protein: 7.9 g/dL (ref 6.5–8.1)

## 2024-03-13 MED ORDER — SODIUM CHLORIDE 1 G PO TABS: ORAL_TABLET | ORAL | 0 refills | Status: DC

## 2024-03-13 NOTE — Progress Notes (Signed)
 [facility] Telephone:(336) [phone]   Fax:(336) [phone]  OFFICE PROGRESS NOTE  [name], MD 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    PRIOR THERAPY:  1) Status post renal biopsy on September 14, 2020 confirming the presence of clear-cell renal cell carcinoma with rhabdoid features. 2) status post induction treatment with immunotherapy with ipilimumab  1 Mg/KG and nivolumab  3 mg/KG every 3 weeks for 4 cycles.  First dose was given on October 02, 2020. 3) Maintenance treatment with nivolumab  480 Mg IV every 4 weeks.  Status post 30 cycles. 3) Cabometyx  40 mg p.o. daily started on October 22, 2023.  This was discontinued after 3 months secondary to disease progression.   CURRENT THERAPY: Systemic therapy with pembrolizumab  200 Mg IV every 3 weeks in addition to lenvatinib  orally. First dose January 29, 2024.  INTERVAL HISTORY: Bob Ewing 53 y.o. male returns to the clinic today for follow-up visit. Discussed the use of AI scribe software for clinical note transcription with the patient, who gave verbal consent to proceed.  History of Present Illness Bob Ewing is a 53 year old male with carcinoma who presents for evaluation before starting cycle number three of pembrolizumab  treatment.  He experiences significant swelling in both legs and feet, describing them as 'big balloon' like and feeling heavy. Pain is present in his legs, particularly upon waking, and the swelling and pain have been persistent for a long time. He is unable to walk and uses a wheelchair for mobility. He attributes some of the discomfort to neuropathy from diabetes.  He has a history of diabetes and is experiencing neuropathy, which contributes to his leg pain. He is currently on pain management with Xtampza  ER  13.5 mg every 12 hours and Percocet 10/325 mg every 6 hours as needed. The pain is better controlled than before, but he still experiences digestive issues.  He mentions a scratch in the scrotal area that is still open and exuding a colorless material. He has been using cortisone cream for it. He also notes swelling in one of his testicles.  He has been experiencing constipation and uses lactulose , which he states is effective in managing his symptoms. However, after two days of eating well, he experienced discomfort and the lactulose  was less effective.  He reports low sodium levels, currently at 125, which is an improvement from previous levels. He has not been taking salt tablets for the past three days due to lack of supply.  He expresses concern about his declining physical condition, noting muscle loss and fatigue. 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     MEDICAL HISTORY: Past Medical History:  Diagnosis Date   Cancer (HCC)    Hypertension    Stroke (HCC)     ALLERGIES:  has no active allergies.  MEDICATIONS:  Current Outpatient Medications  Medication Sig Dispense Refill   amLODipine  (NORVASC ) 5 MG tablet Take 1 tablet (5 mg total) by mouth daily. 30 tablet 2   cyclobenzaprine (FLEXERIL) 10 MG tablet Take 10 mg by mouth at bedtime as needed.     hydrochlorothiazide  (HYDRODIURIL ) 25 MG tablet Take 1 tablet (25 mg total) by mouth daily. 90 tablet 0   ibuprofen (ADVIL) 800 MG tablet Take 800 mg  by mouth every 6 (six) hours as needed.     Lactulose  20 GM/30ML SOLN Take 30 mLs (20 g total) by mouth 2 (two) times daily as needed (take 30 mL up to twice daily as needed for constipation). 450 mL 3   lenvatinib  20 mg daily dose (LENVIMA ) 2 x 10 MG capsule Take 2 capsules (20 mg total) by mouth daily. 60 capsule 11   levothyroxine  (SYNTHROID ) 150 MCG tablet Take 1 tablet (150 mcg total) by mouth daily. 30 tablet 1    metFORMIN (GLUCOPHAGE) 500 MG tablet TAKE 1 TABLET BY MOUTH DAILY 90 tablet 1   metoprolol tartrate (LOPRESSOR) 100 MG tablet Take 100 mg by mouth 2 (two) times daily.     ondansetron  (ZOFRAN ) 8 MG tablet Take 1 tablet (8 mg total) by mouth every 8 (eight) hours as needed for nausea or vomiting. 30 tablet 2   oxyCODONE  ER (XTAMPZA  ER) 13.5 MG C12A Take 13.5 mg by mouth every 12 (twelve) hours. 45 capsule 0   oxyCODONE -acetaminophen  (PERCOCET) 10-325 MG tablet Take 1 tablet by mouth every 6 (six) hours as needed for pain. 60 tablet 0   sodium chloride  1 g tablet Take 1 tablet (1 g total) by mouth 2-3 times daily with meals. 60 tablet 0   No current facility-administered medications for this visit.    SURGICAL HISTORY: No past surgical history on file.  REVIEW OF SYSTEMS:  Constitutional: positive for anorexia, fatigue, and weight loss Eyes: negative Ears, nose, mouth, throat, and face: negative Respiratory: negative Cardiovascular: negative Gastrointestinal: negative Genitourinary:negative Integument/breast: negative Hematologic/lymphatic: negative Musculoskeletal:positive for back pain and bone pain Neurological: negative Behavioral/Psych: negative Endocrine: negative Allergic/Immunologic: negative   PHYSICAL EXAMINATION: General appearance: alert, cooperative, fatigued, and no distress Head: Normocephalic, without obvious abnormality, atraumatic Neck: no adenopathy, no JVD, supple, symmetrical, trachea midline, and thyroid  not enlarged, symmetric, no tenderness/mass/nodules Lymph nodes: Cervical, supraclavicular, and axillary nodes normal. Resp: clear to auscultation bilaterally Back: symmetric, no curvature. ROM normal. No CVA tenderness. Cardio: regular rate and rhythm, S1, S2 normal, no murmur, click, rub or gallop GI: soft, non-tender; bowel sounds normal; no masses,  no organomegaly Extremities: extremities normal, atraumatic, no cyanosis or edema Neurologic: Alert and  oriented X 3, normal strength and tone. Normal symmetric reflexes. Normal coordination and gait  ECOG PERFORMANCE STATUS: 1 - Symptomatic but completely ambulatory  Weight 174 lb (78.9 kg).  LABORATORY DATA: Lab Results  Component Value Date   WBC 6.4 03/13/2024   HGB 10.1 (L) 03/13/2024   HCT 29.8 (L) 03/13/2024   MCV 90.9 03/13/2024   PLT 338 03/13/2024      Chemistry      Component Value Date/Time   NA 127 (L) 02/21/2024 1117   K 4.2 02/21/2024 1117   CL 88 (L) 02/21/2024 1117   CO2 30 02/21/2024 1117   BUN 9 02/21/2024 1117   CREATININE 0.75 02/21/2024 1117      Component Value Date/Time   CALCIUM 9.5 02/21/2024 1117   ALKPHOS 111 02/21/2024 1117   AST 14 (L) 02/21/2024 1117   ALT 16 02/21/2024 1117   BILITOT 0.6 02/21/2024 1117       RADIOGRAPHIC STUDIES: No results found.  ASSESSMENT AND PLAN: This is a very pleasant 53 years old white male with a stage IV clear-cell renal cell carcinoma with rhabdoid features diagnosed in February 2022 with pulmonary involvement as well as mediastinal lymphadenopathy.  He is status post treatment with immunotherapy initially with ipilimumab  and nivolumab  every 3  weeks for 4 cycles followed by maintenance treatment with nivolumab  for completion of 2 years of treatment.  The patient was on observation but unfortunately he started developing disease progression with enlargement of the left kidney as well as adrenal metastasis and pulmonary metastasis. He started treatment with Cabometyx  40 mg p.o. daily but this was discontinued after 3 months of treatment secondary to disease progression.  He is currently on treatment with Keytruda  200 Mg IV every 3 weeks in addition to lenvatinib  20 mg p.o. daily and has been tolerating this treatment fairly well except for the fatigue. Assessment and Plan Assessment & Plan Metastatic carcinoma under active treatment Metastatic carcinoma currently under treatment with pembrolizumab . He is here for  evaluation before starting cycle number three. A scan will be performed in two weeks to assess the effectiveness of the treatment. The decision to continue treatment is based on the scan results to determine if the current regimen is effective. - Proceed with cycle 3 of pembrolizumab  (Keytruda ) - Order scan in two weeks to evaluate treatment efficacy  Cancer-related pain Cancer-related pain is being managed by the palliative care team. Pain is better controlled than before with current medications, including Xtampza  ER and Percocet. - Continue Xtampza  ER 13.5 mg every 12 hours - Continue Percocet 10/325 mg every 6 hours as needed for pain  Bilateral lower extremity edema and hyponatremia Bilateral lower extremity edema likely due to fluid retention. Hyponatremia is present with a sodium level of 125, improved from 122. He is not currently taking salt tablets due to lack of supply. Lasix is not being administered due to the risk of further decreasing sodium levels. Compression socks were suggested but financial constraints may limit this option. - Refill salt tablets - Advise to purchase and use compression socks if financially feasible  Scrotal skin lesion with swelling Scrotal skin lesion with colorless discharge. Swelling may be related to fluid retention. He has been using cortisone cream. If the condition worsens, referral to a wound clinic may be necessary. - Advise to avoid scratching the area - Monitor for worsening symptoms and consider referral to a wound clinic if necessary  Constipation Constipation is being managed with lactulose , which is effective in preventing severe symptoms. However, recent dietary changes have reduced its effectiveness. - Continue lactulose  as needed  Type 2 diabetes mellitus Type 2 diabetes mellitus is present. Current blood sugar level is 95, indicating good control.  Cancer-related fatigue and functional decline Cancer-related fatigue and functional  decline are present. He reports significant weakness and muscle loss. Adjustments to medication may be considered after evaluating the upcoming scan results. The possibility of reducing linfatin dosage was discussed to help with fatigue, contingent on scan results showing treatment efficacy. - Consider adjusting medication dosage based on scan results He was advised to call immediately if he has any concerning symptoms in the interval. The patient voices understanding of current disease status and treatment options and is in agreement with the current care plan.  All questions were answered. The patient knows to call the clinic with any problems, questions or concerns. We can certainly see the patient much sooner if necessary.  The total time spent in the appointment was 30 minutes including review of chart and various tests results, discussions about plan of care and coordination of care plan .   Disclaimer: This note was dictated with voice recognition software. Similar sounding words can inadvertently be transcribed and may not be corrected upon review.

## 2024-03-14 ENCOUNTER — Ambulatory Visit (HOSPITAL_COMMUNITY)
Admission: RE | Admit: 2024-03-14 | Discharge: 2024-03-14 | Disposition: A | Discharge status: Home / Self Care | Source: Ambulatory Visit | Attending: Physician Assistant | Admitting: Physician Assistant

## 2024-03-14 ENCOUNTER — Encounter: Payer: Self-pay | Admitting: Internal Medicine

## 2024-03-14 ENCOUNTER — Inpatient Hospital Stay

## 2024-03-14 ENCOUNTER — Encounter

## 2024-03-14 VITALS — BP 118/72 | HR 88 | Temp 98.2°F | Resp 18

## 2024-03-14 DIAGNOSIS — C642 Malignant neoplasm of left kidney, except renal pelvis: Secondary | ICD-10-CM | POA: Insufficient documentation

## 2024-03-14 DIAGNOSIS — Z5112 Encounter for antineoplastic immunotherapy: Secondary | ICD-10-CM | POA: Diagnosis not present

## 2024-03-14 LAB — T4: T4, Total: 9 ug/dL (ref 4.5–12.0)

## 2024-03-14 LAB — TSH: TSH: 13.9 u[IU]/mL — ABNORMAL HIGH (ref 0.350–4.500)

## 2024-03-14 MED ORDER — SODIUM CHLORIDE 0.9 % IV SOLN
INTRAVENOUS | Status: DC
Start: 2024-03-14 — End: 2024-03-14

## 2024-03-14 MED ORDER — SODIUM CHLORIDE 0.9 % IV SOLN
200.0000 mg | Freq: Once | INTRAVENOUS | Status: AC
  Administered 2024-03-14: 200 mg via INTRAVENOUS
  Filled 2024-03-14: qty 200

## 2024-03-14 NOTE — Progress Notes (Signed)
 Palliative Medicine [facility]  Telephone:(336) [phone] Fax:(336) [phone]   Name: Bob Ewing Date: 03/14/2024 MRN: [id number]  DOB: [birth year]  Patient Care Team: [name], MD as PCP - General (Internal Medicine) [name], MD as Consulting Physician (Oncology)   INTERVAL HISTORY: Bob Ewing is a 53 y.o. 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     Oncologic history per notes: Primary malignant neoplasm of left kidney with metastasis from kidney to other site,  Stage IV (cT2a, cN0, pM1)    Primary malignant neoplasm of left kidney with metastasis from kidney to other site [facility]) He is status post renal biopsy on September 14, 2020 confirming the presence of clear-cell renal cell carcinoma with rhabdoid features.    The patient started induction treatment with immunotherapy with ipilimumab  1 Mg/KG and nivolumab  3 mg/KG every 3 weeks for 4 cycles.  First dose was given on October 02, 2020.  He is currently undergoing maintenance treatment with nivolumab  480 Mg IV every 4 weeks.  Status post 30 cycles.    He had evidence for disease progression and the patient started treatment with Cabometyx  40 mg p.o. daily on October 22, 2023 status post 4 weeks of treatment.    He had a restaging CT scan in June 2025 which showed disease progression with increase in the size of the left renal mass with extrarenal extension as well as progression of retroperitoneal adenopathy and new nodular thickening of the left adrenal gland suspicious for metastatic disease. There was also development of a small left pleural effusion.     Therefore, Dr. Sherrod changed his treatment to Keytruda  and Lenvatinib . His first cycle of treatment was given 01/31/2024.  Patient tolerated initial treatment with Keytruda  well.  Continues lenvatinib  as prescribed. Also struggling with hyponatremia on salt tabs (recent change to BP regimen to  see if this can help). Being referred to urology to see if he is a surgical candidate. Being followed by SW as there are several psychosocial issues as well.   Intent of Therapy: Non-Curative / Palliative Intent  SOCIAL HISTORY:     reports that he has been smoking cigarettes. He has never used smokeless tobacco. He reports current alcohol use. He reports that he does not use drugs.  ADVANCE DIRECTIVES:  Not on file, full code full scope, does not wish to make any changes to advance directives at this time  CODE STATUS: Full code  PAST MEDICAL HISTORY: Past Medical History:  Diagnosis Date   Cancer (HCC)    Hypertension    Stroke (HCC)     ALLERGIES:  has no active allergies.  MEDICATIONS:  Current Outpatient Medications  Medication Sig Dispense Refill   amLODipine  (NORVASC ) 5 MG tablet Take 1 tablet (5 mg total) by mouth daily. 30 tablet 2   cyclobenzaprine (FLEXERIL) 10 MG tablet Take 10 mg by mouth at bedtime as needed.     hydrochlorothiazide  (HYDRODIURIL ) 25 MG tablet Take 1 tablet (25 mg total) by mouth daily. 90 tablet 0   ibuprofen (ADVIL) 800 MG tablet Take 800 mg by mouth every 6 (six) hours as needed.     Lactulose  20 GM/30ML SOLN Take 30 mLs (20 g total) by mouth 2 (two) times daily as needed (take 30 mL up to twice daily as needed for constipation). 450 mL 3   lenvatinib  20 mg daily dose (LENVIMA ) 2 x 10 MG capsule Take 2 capsules (20 mg total) by  mouth daily. 60 capsule 11   levothyroxine  (SYNTHROID ) 150 MCG tablet Take 1 tablet (150 mcg total) by mouth daily. 30 tablet 1   metFORMIN (GLUCOPHAGE) 500 MG tablet TAKE 1 TABLET BY MOUTH DAILY 90 tablet 1   metoprolol tartrate (LOPRESSOR) 100 MG tablet Take 100 mg by mouth 2 (two) times daily.     ondansetron  (ZOFRAN ) 8 MG tablet Take 1 tablet (8 mg total) by mouth every 8 (eight) hours as needed for nausea or vomiting. 30 tablet 2   oxyCODONE  ER (XTAMPZA  ER) 13.5 MG C12A Take 13.5 mg by mouth every 12 (twelve) hours. 45  capsule 0   oxyCODONE -acetaminophen  (PERCOCET) 10-325 MG tablet Take 1 tablet by mouth every 6 (six) hours as needed for pain. 60 tablet 0   sodium chloride  1 g tablet Take 1 tablet (1 g total) by mouth 2-3 times daily with meals. 60 tablet 0   No current facility-administered medications for this visit.   Facility-Administered Medications Ordered in Other Visits  Medication Dose Route Frequency Provider Last Rate Last Admin   0.9 %  sodium chloride  infusion   Intravenous Continuous [name], MD 10 mL/hr at 03/14/24 1439 New Bag at 03/14/24 1439   pembrolizumab  (KEYTRUDA ) 200 mg in sodium chloride  0.9 % 50 mL chemo infusion  200 mg Intravenous Once Mohamed, Mohamed, MD 116 mL/hr at 03/14/24 1441 200 mg at 03/14/24 1441    VITAL SIGNS: BP 119/76 (BP Location: Left Arm, Patient Position: Sitting)   Pulse 94   Temp 98.4 F (36.9 C) (Temporal)   Resp 20   Ht 5' 7 (1.702 m)   SpO2 97%   BMI 29.21 kg/m  There were no vitals filed for this visit.  Estimated body mass index is 29.21 kg/m as calculated from the following:   Height as of this encounter: 5' 7 (1.702 m).   Weight as of 02/21/24: 186 lb 8 oz (84.6 kg).   PERFORMANCE STATUS (ECOG) : 1 - Symptomatic but completely ambulatory  Assessment NAD,  RRR Normal breathing pattern AAO x3  IMPRESSION: Discussed the use of AI scribe software for clinical note transcription with the patient, who gave verbal consent to proceed.  History of Present Illness Bob Ewing is a 53 year old male with cancer who presents to clinic for symptom management follow-up. He is complaining of ongoing pain in lower abdomen and testicle are. Denies concerns of nausea, vomiting, or diarrhea.   He reports significant swelling in his feet, describing them as 'balloon' like, which causes pain when he steps on them. He has a history of diabetes, which he believes complicates the swelling and pain in his feet. He stopped taking hydrochlorothiazide   four days ago after a medication change to Norvasc , which he believes may be contributing to the increased swelling in his legs. He is concerned about his circulation, feeling like 'the blood not going down'.  He has been experiencing issues with constipation, which he attributes to his diabetes. He is taking lactulose , but finds it too sweet and not always effective. Recently, he used a glycerin suppository as an emergency measure to relieve constipation. Education provided on use of lactulose . He reports concerns of use potentially causing elevated blood sugars.  I advised patient of safety use with lactulose .  Encourage patient to take 1-2 times daily for bowel regimen in the setting of opioid use.  He verbalized understanding.  His appetite remains poor, and he has experienced weight loss, noting a decrease from 200 pounds to  186 pounds over the past 15 days. He is concerned about his overall health and mentions a psychological impact from his current condition.  We discussed ways to improve his appetite and increase his protein intake.  He has tried protein drinks.  We discussed focusing on small frequent meals versus large meals.  Can consider appetite stimulant in the future however at this time patient is not interested in adding more medications to his list.   He experiences persistent and worsening pain primarily in his hip and back, describing it as 'burning' on the side and 'like broken' in the back. The pain is severe enough to necessitate the use of a wheelchair for mobility for today's visit. He is currently taking Xtampza  every twelve hours and oxycodone  every six hours for pain management. Mr. Jared states he feels current regimen is ok and not interested in adjustments at this time. Would like to allow additional time to evaluate.   We will continue to closely monitor and support closely.  All questions answered and support provided.  Assessment & Plan Cancer-related and  musculoskeletal pain management Chronic pain due to cancer and musculoskeletal issues, including cancer-related pain, muscle pain, nerve pain in the pelvis, and gas-related abdominal pain. Current pain management with oxycodone  ER and oxycodone  with acetaminophen  is not fully effective, with breakthrough pain every 4-5 hours.  - Instruct to take oxycodone  ER (Xtampza ) every 12 hours consistently as he had been taking it sporadically. Will continue to monitor effectiveness - Continue oxycodone  with acetaminophen  (Percocet) for breakthrough pain every 6 hours as needed. - Consider referral to physical therapy for muscle pain management, with the possibility of home PT if transportation is an issue. Declined referral today.   Anorexia and abnormal weight loss Poor appetite and difficulty eating. Difficulty consuming meat and other protein-rich foods. - Encourage small, frequent meals with high-protein foods. - Consider referral to a dietitian for nutritional support. Declined referral today.  - Consider meal supplements like Ensure or Boost.  Bilateral lower extremity edema Bilateral lower extremity edema, more pronounced on the left side.  - Elevate legs when sitting to reduce edema. - Increase protein intake -hydrochlorothiazide  discontinued per patient.   Constipation Constipation managed with lactulose , which is noted to be too sweet for diabetes management. Lactulose  is effective but not consistently, leading to the use of glycerin suppositories for relief. Concerns about blood sugar levels due to lactulose  intake. - Continue lactulose  up to three times a day as needed - Use glycerin suppositories as needed for acute relief  Scorieted area under testicles with discharge Presence of a scorieted area under the testicles with some discharge.  I will plan to see patient back in 1-3 weeks. Sooner if needed.   Patient expressed understanding and was in agreement with this plan. He also  understands that He can call the clinic at any time with any questions, concerns, or complaints.   Any controlled substances utilized were prescribed in the context of palliative care. PDMP has been reviewed.   Visit consisted of counseling and education dealing with the complex and emotionally intense issues of symptom management and palliative care in the setting of serious and potentially life-threatening illness.  Laymon Pinal, DNP, AGNP-c, [facility] Levon Borer, AGPCNP-BC  Palliative Medicine Team/[facility]

## 2024-03-14 NOTE — Patient Instructions (Signed)

## 2024-03-15 ENCOUNTER — Telehealth: Payer: Self-pay | Admitting: Medical Oncology

## 2024-03-15 ENCOUNTER — Inpatient Hospital Stay: Admitting: Licensed Clinical Social Worker

## 2024-03-15 DIAGNOSIS — C642 Malignant neoplasm of left kidney, except renal pelvis: Secondary | ICD-10-CM

## 2024-03-15 NOTE — Progress Notes (Signed)
 [facility] CSW Progress Note  Clinical Child psychotherapist contacted patient by phone to follow-up on financial concerns.    Interventions: Pt states he has been unable to obtain a copy of his lease.  CSW requested the telephone number for the rental agency and contacted them on behalf of the patient.  CSW spoke with Mliss at [employer] [phone]) and explained pt's situation.  Mliss emailed a copy of pt's lease to CSW. CSW forwarded the lease to the [employer] to complete pt's application.      Follow Up Plan:  Patient will contact CSW with any support or resource needs    Devere JONELLE Manna, LCSW Clinical Social Worker [facility]

## 2024-03-15 NOTE — Telephone Encounter (Addendum)
 I returned pts call and had to LVM. I said that from hi scall I heard him say he is not eligible for surgery and asked about salt tablets.   I told him that Dr. Sherrod sent in an rx for  sodium tablets # 60 , five days ago. I told him to call back because I did not understand what the 3rd question was on his message.

## 2024-03-18 ENCOUNTER — Inpatient Hospital Stay: Admitting: Licensed Clinical Social Worker

## 2024-03-18 DIAGNOSIS — C642 Malignant neoplasm of left kidney, except renal pelvis: Secondary | ICD-10-CM

## 2024-03-19 ENCOUNTER — Encounter: Payer: Self-pay | Admitting: Internal Medicine

## 2024-03-19 NOTE — Progress Notes (Signed)
 [facility] CSW Progress Note  Clinical Child psychotherapist contacted patient by phone to follow-up on financial concerns.    Interventions: Pt applied and approved for the [employer].  Pt re-applied for the [employer].  CSW spoke with the [employer] to inform August and September rent will be covered by grants.  W-9 emailed to [employer] and returned.  All information forwarded to the [facility].  Pt informed of the above.        Follow Up Plan:  Patient will contact CSW with any support or resource needs    Devere JONELLE Manna, LCSW Clinical Social Worker [facility]

## 2024-03-19 NOTE — Progress Notes (Signed)
Pt is approved for a 2nd $1000 Advertising account executive.

## 2024-03-21 ENCOUNTER — Telehealth: Payer: Self-pay | Admitting: Medical Oncology

## 2024-03-21 ENCOUNTER — Other Ambulatory Visit: Payer: Self-pay

## 2024-03-21 ENCOUNTER — Other Ambulatory Visit: Payer: Self-pay | Admitting: Physician Assistant

## 2024-03-21 DIAGNOSIS — K5903 Drug induced constipation: Secondary | ICD-10-CM

## 2024-03-21 DIAGNOSIS — Z515 Encounter for palliative care: Secondary | ICD-10-CM

## 2024-03-21 DIAGNOSIS — G893 Neoplasm related pain (acute) (chronic): Secondary | ICD-10-CM

## 2024-03-21 DIAGNOSIS — I159 Secondary hypertension, unspecified: Secondary | ICD-10-CM

## 2024-03-21 DIAGNOSIS — C642 Malignant neoplasm of left kidney, except renal pelvis: Secondary | ICD-10-CM

## 2024-03-21 MED ORDER — LACTULOSE 20 GM/30ML PO SOLN: 30.0000 mL | Freq: Three times a day (TID) | ORAL | 3 refills | Status: DC | PRN

## 2024-03-21 MED ORDER — OXYCODONE-ACETAMINOPHEN 10-325 MG PO TABS: 1.0000 | ORAL_TABLET | Freq: Four times a day (QID) | ORAL | 0 refills | Status: DC | PRN

## 2024-03-21 MED ORDER — AMLODIPINE BESYLATE 5 MG PO TABS: 5.0000 mg | ORAL_TABLET | Freq: Every day | ORAL | 2 refills | Status: DC

## 2024-03-21 NOTE — Telephone Encounter (Signed)
 Patient called for a refill of medication, see associated orders. Pt also c/o constipation and groin wound. For the constipation pt was advised to increase lactulose  to TID, add in senna, and to take Magnesium citrate for today only, as he has not had a BM in 6 days. For his groin wound, pt reported it was getting worse now with white drainage and a foul smell. Pt urged to visit PCP, pt did not have one, pt then instructed to go to an urgent care or an emergency room to have it assessed. Pt verbalized understanding and agreement with all education and recommendations. No further needs at this time.

## 2024-03-21 NOTE — Telephone Encounter (Signed)
 Requests refill for pain med ( Palliative care ) and bp med ( Cassie) .  Call transferred to Palliative care and Cassie notified about refill BP med.

## 2024-03-25 ENCOUNTER — Inpatient Hospital Stay (HOSPITAL_BASED_OUTPATIENT_CLINIC_OR_DEPARTMENT_OTHER): Admitting: Nurse Practitioner

## 2024-03-25 ENCOUNTER — Other Ambulatory Visit (HOSPITAL_COMMUNITY): Payer: Self-pay

## 2024-03-25 ENCOUNTER — Encounter: Payer: Self-pay | Admitting: Nurse Practitioner

## 2024-03-25 ENCOUNTER — Inpatient Hospital Stay: Admitting: Licensed Clinical Social Worker

## 2024-03-25 DIAGNOSIS — R531 Weakness: Secondary | ICD-10-CM | POA: Diagnosis not present

## 2024-03-25 DIAGNOSIS — G893 Neoplasm related pain (acute) (chronic): Secondary | ICD-10-CM

## 2024-03-25 DIAGNOSIS — K5903 Drug induced constipation: Secondary | ICD-10-CM

## 2024-03-25 DIAGNOSIS — C642 Malignant neoplasm of left kidney, except renal pelvis: Secondary | ICD-10-CM

## 2024-03-25 DIAGNOSIS — R630 Anorexia: Secondary | ICD-10-CM

## 2024-03-25 DIAGNOSIS — R530 Neoplastic (malignant) related fatigue: Secondary | ICD-10-CM

## 2024-03-25 DIAGNOSIS — Z515 Encounter for palliative care: Secondary | ICD-10-CM | POA: Diagnosis not present

## 2024-03-26 ENCOUNTER — Encounter: Payer: Self-pay | Admitting: Internal Medicine

## 2024-03-26 NOTE — Progress Notes (Signed)
 Palliative Medicine [facility]  [phone]) [phone] [phone]) [phone]   Name: Bob Ewing Date: 03/26/2024 MRN: [id number]  DOB: Sep 04, 1970  Patient Care Team: [name], MD as PCP - General (Internal Medicine) [name], MD as Consulting Physician (Oncology) Pickenpack-Cousar, Fannie SAILOR, NP as Nurse Practitioner [facility] and Palliative Medicine)   I connected with Bob Ewing on 03/25/24 at 10:45 AM EDT by Telephone and verified that I am speaking with the correct person using two identifiers.   I discussed the limitations, risks, security and privacy concerns of performing an evaluation and management service by telemedicine and the availability of in-person appointments. I also discussed with the patient that there may be a patient responsible charge related to this service. The patient expressed understanding and agreed to proceed.   Other persons participating in the visit and their role in the encounter: N/A   Patient's location: Home   Provider's location: [facility]   INTERVAL HISTORY: Bob Ewing is a 53 y.o. 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    The patient started induction treatment with immunotherapy with ipilimumab  1 Mg/KG and nivolumab  3 mg/KG every 3 weeks for 4 cycles.  First dose was given on October 02, 2020.  He is currently undergoing maintenance treatment with nivolumab  480 Mg IV every 4 weeks.  Status post 30 cycles.    He had evidence for disease progression and the patient started treatment with  Cabometyx  40 mg p.o. daily on October 22, 2023 status post 4 weeks of treatment.    He had a restaging CT scan in June 2025 which showed disease progression with increase in the size of the left renal mass with extrarenal extension as well as progression of retroperitoneal adenopathy and new nodular thickening of the left adrenal gland suspicious for metastatic disease. There was also development of a small left pleural effusion.     Therefore, Dr. Sherrod changed his treatment to Keytruda  and Lenvatinib . His first cycle of treatment was given 01/31/2024.  Patient tolerated initial treatment with Keytruda  well.  Continues lenvatinib  as prescribed. Also struggling with hyponatremia on salt tabs (recent change to BP regimen to see if this can help). Being referred to urology to see if he is a surgical candidate. Being followed by SW as there are several psychosocial issues as well.   Intent of Therapy: Non-Curative / Palliative Intent  SOCIAL HISTORY:     reports that he has been smoking cigarettes. He has never used smokeless tobacco. He reports current alcohol use. He reports that he does not use drugs.  ADVANCE DIRECTIVES:  Not on file, full code full scope, does not wish to make any changes to advance directives at this time  CODE STATUS: Full code  PAST MEDICAL HISTORY: Past Medical History:  Diagnosis Date   Cancer (HCC)    Hypertension    Stroke (HCC)     ALLERGIES:  has no active allergies.  MEDICATIONS:  Current Outpatient Medications  Medication Sig Dispense Refill   amLODipine  (NORVASC ) 5 MG tablet Take 1 tablet (5 mg total) by  mouth daily. 30 tablet 2   cyclobenzaprine (FLEXERIL) 10 MG tablet Take 10 mg by mouth at bedtime as needed.     hydrochlorothiazide  (HYDRODIURIL ) 25 MG tablet Take 1 tablet (25 mg total) by mouth daily. 90 tablet 0   ibuprofen (ADVIL) 800 MG tablet Take 800 mg by mouth every 6 (six) hours as needed.     Lactulose  20 GM/30ML SOLN Take 30 mLs (20 g total)  by mouth 3 (three) times daily as needed (take 30 mL up to three times daily as needed for constipation). 450 mL 3   lenvatinib  20 mg daily dose (LENVIMA ) 2 x 10 MG capsule Take 2 capsules (20 mg total) by mouth daily. 60 capsule 11   levothyroxine  (SYNTHROID ) 150 MCG tablet Take 1 tablet (150 mcg total) by mouth daily. 30 tablet 1   metFORMIN (GLUCOPHAGE) 500 MG tablet TAKE 1 TABLET BY MOUTH DAILY 90 tablet 1   metoprolol tartrate (LOPRESSOR) 100 MG tablet Take 100 mg by mouth 2 (two) times daily.     ondansetron  (ZOFRAN ) 8 MG tablet Take 1 tablet (8 mg total) by mouth every 8 (eight) hours as needed for nausea or vomiting. 30 tablet 2   oxyCODONE  ER (XTAMPZA  ER) 13.5 MG C12A Take 13.5 mg by mouth every 12 (twelve) hours. 45 capsule 0   oxyCODONE -acetaminophen  (PERCOCET) 10-325 MG tablet Take 1 tablet by mouth every 6 (six) hours as needed for pain. 60 tablet 0   sodium chloride  1 g tablet Take 1 tablet (1 g total) by mouth 2-3 times daily with meals. 60 tablet 0   No current facility-administered medications for this visit.    VITAL SIGNS: There were no vitals taken for this visit. There were no vitals filed for this visit.  Estimated body mass index is 27.25 kg/m as calculated from the following:   Height as of 03/13/24: 5' 7 (1.702 m).   Weight as of 03/13/24: 174 lb (78.9 kg).   PERFORMANCE STATUS (ECOG) : 1 - Symptomatic but completely ambulatory  Assessment NAD,  RRR Normal breathing pattern AAO x3  IMPRESSION: Discussed the use of AI scribe software for clinical note transcription with the patient, who gave verbal consent to proceed.  History of Present Illness Bob Ewing is a 53 year old male with cancer who I connected with by phone for symptom management follow-up. No acute distress identified. Reports appetite fluctuates. Some days are better than others. We discussed focusing on small frequent meals versus large meals.  Can consider appetite stimulant in the future  however at this time patient is not interested in adding more medications to his list.  Continues to endorse discomfort to his testicle areas including odor causing him to have to clean peritoneal area often throughout the day. Patient previously encouraged to follow-up with PCP for further support. He denies having done this as of yet.   Ongoing constipation. States he is taking Lactulose  at least twice daily but does not like the taste. I recommended magnesium citrate for immediate relief and to begin Senna 2 tablets twice daily. He verbalized understanding.   Mr. Mahabir reports his pain is somewhat better. He is currently managing pain with Xtampza , taking one dose every twelve hours and oxycodone  10/325mg  every six hours as needed. Does not require around the clock over past several days which he is appreciative of. No adjustments to regimen.  We will continue to closely monitor and support closely.  All questions answered and support provided.  Assessment & Plan  Cancer-related and musculoskeletal pain management Chronic pain due to cancer and musculoskeletal issues, including cancer-related pain, muscle pain, nerve pain in the pelvis, and gas-related abdominal pain. Current pain management with oxycodone  ER and oxycodone  with acetaminophen  for breakthrough pain every 4-6 hours.  - Instruct to take oxycodone  ER (Xtampza ) every 12 hours consistently as he had been taking it sporadically. Will continue to monitor effectiveness - Continue oxycodone  with acetaminophen  (Percocet) for breakthrough pain every 6 hours as needed.  Anorexia and abnormal weight loss Poor appetite and difficulty eating. Difficulty consuming meat and other protein-rich foods. - Encourage small, frequent meals with high-protein foods. - Consider referral to a dietitian for nutritional support. Declined referral today.  - Consider meal supplements like Ensure or Boost.  Constipation Constipation managed with lactulose ,  which is noted to be too sweet for diabetes management. Concerns about blood sugar levels due to lactulose  intake. - Continue lactulose  up to three times a day as needed - Use magnesium citrate for acute relief -Senna S 2 tablets at bedtime. If no improvement increase to twice daily.   I will plan to see patient back in 1-3 weeks. Sooner if needed.   Patient expressed understanding and was in agreement with this plan. He also understands that He can call the clinic at any time with any questions, concerns, or complaints.   Any controlled substances utilized were prescribed in the context of palliative care. PDMP has been reviewed.   I provided 25 minutes of non face-to-face telephone visit time during this encounter, and > 50% was spent counseling as documented under my assessment & plan. Visit consisted of counseling and education dealing with the complex and emotionally intense issues of symptom management and palliative care in the setting of serious and potentially life-threatening illness.  Levon Borer, AGPCNP-BC  Palliative Medicine Team/[facility]

## 2024-03-26 NOTE — Progress Notes (Signed)
 [facility] CSW Progress Note  Visual merchandiser spoke with pt over the phone to follow-up on financial concerns.    Interventions: CSW received a message from pt inquiring about how to access the rest of the funds from the [employer].  Pt instructed to contact [employer] and arrange to submit a bill as the funds are not given directly to the pt, they are applied directly to bills.        Follow Up Plan:  Patient will contact CSW with any support or resource needs    Devere JONELLE Manna, LCSW Clinical Social Worker [facility]

## 2024-03-27 ENCOUNTER — Other Ambulatory Visit: Payer: Self-pay

## 2024-03-27 ENCOUNTER — Other Ambulatory Visit: Payer: Self-pay | Admitting: Pharmacy Technician

## 2024-03-27 NOTE — Progress Notes (Signed)
 Specialty Pharmacy Refill Coordination Note  Bob Ewing is a 53 y.o. male contacted today regarding refills of specialty medication(s) Lenvatinib  Mesylate (LENVIMA )   Patient requested Delivery   Delivery date: 03/28/24   Verified address: [address]   [place] [address]   Medication will be filled on 03/27/24.

## 2024-04-02 ENCOUNTER — Emergency Department (HOSPITAL_COMMUNITY)

## 2024-04-02 ENCOUNTER — Other Ambulatory Visit: Payer: Self-pay

## 2024-04-02 ENCOUNTER — Encounter (HOSPITAL_COMMUNITY): Payer: Self-pay

## 2024-04-02 ENCOUNTER — Inpatient Hospital Stay (HOSPITAL_COMMUNITY)
Admission: EM | Admit: 2024-04-02 | Discharge: 2024-05-01 | DRG: 687 | Disposition: E | Attending: Internal Medicine | Admitting: Internal Medicine

## 2024-04-02 DIAGNOSIS — R531 Weakness: Secondary | ICD-10-CM | POA: Diagnosis present

## 2024-04-02 DIAGNOSIS — R627 Adult failure to thrive: Secondary | ICD-10-CM | POA: Diagnosis present

## 2024-04-02 DIAGNOSIS — E119 Type 2 diabetes mellitus without complications: Secondary | ICD-10-CM | POA: Diagnosis present

## 2024-04-02 DIAGNOSIS — R451 Restlessness and agitation: Secondary | ICD-10-CM | POA: Diagnosis present

## 2024-04-02 DIAGNOSIS — K5900 Constipation, unspecified: Secondary | ICD-10-CM | POA: Diagnosis present

## 2024-04-02 DIAGNOSIS — E88A Wasting disease (syndrome) due to underlying condition: Secondary | ICD-10-CM | POA: Diagnosis present

## 2024-04-02 DIAGNOSIS — C771 Secondary and unspecified malignant neoplasm of intrathoracic lymph nodes: Secondary | ICD-10-CM | POA: Diagnosis present

## 2024-04-02 DIAGNOSIS — E039 Hypothyroidism, unspecified: Secondary | ICD-10-CM | POA: Diagnosis present

## 2024-04-02 DIAGNOSIS — G893 Neoplasm related pain (acute) (chronic): Secondary | ICD-10-CM | POA: Diagnosis present

## 2024-04-02 DIAGNOSIS — Z711 Person with feared health complaint in whom no diagnosis is made: Secondary | ICD-10-CM | POA: Diagnosis not present

## 2024-04-02 DIAGNOSIS — Z8673 Personal history of transient ischemic attack (TIA), and cerebral infarction without residual deficits: Secondary | ICD-10-CM

## 2024-04-02 DIAGNOSIS — C642 Malignant neoplasm of left kidney, except renal pelvis: Principal | ICD-10-CM | POA: Diagnosis present

## 2024-04-02 DIAGNOSIS — Z6827 Body mass index (BMI) 27.0-27.9, adult: Secondary | ICD-10-CM

## 2024-04-02 DIAGNOSIS — C7951 Secondary malignant neoplasm of bone: Secondary | ICD-10-CM | POA: Diagnosis present

## 2024-04-02 DIAGNOSIS — E871 Hypo-osmolality and hyponatremia: Secondary | ICD-10-CM | POA: Diagnosis not present

## 2024-04-02 DIAGNOSIS — D630 Anemia in neoplastic disease: Secondary | ICD-10-CM | POA: Diagnosis present

## 2024-04-02 DIAGNOSIS — I10 Essential (primary) hypertension: Secondary | ICD-10-CM | POA: Diagnosis present

## 2024-04-02 DIAGNOSIS — Z7189 Other specified counseling: Secondary | ICD-10-CM | POA: Diagnosis not present

## 2024-04-02 DIAGNOSIS — Z79890 Hormone replacement therapy (postmenopausal): Secondary | ICD-10-CM

## 2024-04-02 DIAGNOSIS — Z66 Do not resuscitate: Secondary | ICD-10-CM | POA: Diagnosis present

## 2024-04-02 DIAGNOSIS — F17210 Nicotine dependence, cigarettes, uncomplicated: Secondary | ICD-10-CM | POA: Diagnosis present

## 2024-04-02 DIAGNOSIS — N5089 Other specified disorders of the male genital organs: Secondary | ICD-10-CM | POA: Diagnosis present

## 2024-04-02 DIAGNOSIS — E8770 Fluid overload, unspecified: Secondary | ICD-10-CM | POA: Diagnosis present

## 2024-04-02 DIAGNOSIS — Z9221 Personal history of antineoplastic chemotherapy: Secondary | ICD-10-CM

## 2024-04-02 DIAGNOSIS — C7800 Secondary malignant neoplasm of unspecified lung: Secondary | ICD-10-CM | POA: Diagnosis present

## 2024-04-02 DIAGNOSIS — C649 Malignant neoplasm of unspecified kidney, except renal pelvis: Secondary | ICD-10-CM

## 2024-04-02 DIAGNOSIS — Z833 Family history of diabetes mellitus: Secondary | ICD-10-CM

## 2024-04-02 DIAGNOSIS — Z515 Encounter for palliative care: Secondary | ICD-10-CM

## 2024-04-02 DIAGNOSIS — Z79899 Other long term (current) drug therapy: Secondary | ICD-10-CM

## 2024-04-02 DIAGNOSIS — Z79891 Long term (current) use of opiate analgesic: Secondary | ICD-10-CM

## 2024-04-02 DIAGNOSIS — Z823 Family history of stroke: Secondary | ICD-10-CM

## 2024-04-02 DIAGNOSIS — E860 Dehydration: Secondary | ICD-10-CM | POA: Diagnosis present

## 2024-04-02 DIAGNOSIS — E785 Hyperlipidemia, unspecified: Secondary | ICD-10-CM | POA: Diagnosis present

## 2024-04-02 DIAGNOSIS — R52 Pain, unspecified: Principal | ICD-10-CM

## 2024-04-02 DIAGNOSIS — Z7984 Long term (current) use of oral hypoglycemic drugs: Secondary | ICD-10-CM

## 2024-04-02 DIAGNOSIS — E861 Hypovolemia: Secondary | ICD-10-CM | POA: Diagnosis present

## 2024-04-02 LAB — ABO/RH: ABO/RH(D): A NEG

## 2024-04-02 LAB — COMPREHENSIVE METABOLIC PANEL WITH GFR
ALT: 25 U/L (ref 0–44)
AST: 37 U/L (ref 15–41)
Albumin: 3.5 g/dL (ref 3.5–5.0)
Alkaline Phosphatase: 138 U/L — ABNORMAL HIGH (ref 38–126)
Anion gap: 20 — ABNORMAL HIGH (ref 5–15)
BUN: 14 mg/dL (ref 6–20)
CO2: 20 mmol/L — ABNORMAL LOW (ref 22–32)
Calcium: 11.7 mg/dL — ABNORMAL HIGH (ref 8.9–10.3)
Chloride: 85 mmol/L — ABNORMAL LOW (ref 98–111)
Creatinine, Ser: 0.6 mg/dL — ABNORMAL LOW (ref 0.61–1.24)
GFR, Estimated: >60 mL/min (ref >60)
Glucose, Bld: 88 mg/dL (ref 70–99)
Potassium: 4.4 mmol/L (ref 3.5–5.1)
Sodium: 125 mmol/L — ABNORMAL LOW (ref 135–145)
Total Bilirubin: 0.6 mg/dL (ref 0.0–1.2)
Total Protein: 7.4 g/dL (ref 6.5–8.1)

## 2024-04-02 LAB — CBC
HCT: 27.9 % — ABNORMAL LOW (ref 39.0–52.0)
Hemoglobin: 8.9 g/dL — ABNORMAL LOW (ref 13.0–17.0)
MCH: 29 pg (ref 26.0–34.0)
MCHC: 31.9 g/dL (ref 30.0–36.0)
MCV: 90.9 fL (ref 80.0–100.0)
Platelets: 284 K/uL (ref 150–400)
RBC: 3.07 MIL/uL — ABNORMAL LOW (ref 4.22–5.81)
RDW: 15.4 % (ref 11.5–15.5)
WBC: 7.9 K/uL (ref 4.0–10.5)
nRBC: 0.6 % — ABNORMAL HIGH (ref 0.0–0.2)

## 2024-04-02 LAB — TYPE AND SCREEN
ABO/RH(D): A NEG
Antibody Screen: NEGATIVE

## 2024-04-02 LAB — URINALYSIS, ROUTINE W REFLEX MICROSCOPIC
Bilirubin Urine: NEGATIVE
Glucose, UA: NEGATIVE mg/dL
Hgb urine dipstick: NEGATIVE
Ketones, ur: 80 mg/dL — AB
Leukocytes,Ua: NEGATIVE
Nitrite: NEGATIVE
Protein, ur: NEGATIVE mg/dL
Specific Gravity, Urine: >1.046 — ABNORMAL HIGH (ref 1.005–1.030)
pH: 5 (ref 5.0–8.0)

## 2024-04-02 LAB — TROPONIN T, HIGH SENSITIVITY
Troponin T High Sensitivity: 16 ng/L (ref 0–19)
Troponin T High Sensitivity: 18 ng/L (ref 0–19)

## 2024-04-02 LAB — HEMOGLOBIN A1C
Hgb A1c MFr Bld: 5.1 % (ref 4.8–5.6)
Mean Plasma Glucose: 99.67 mg/dL

## 2024-04-02 LAB — GLUCOSE, CAPILLARY
Glucose-Capillary: 65 mg/dL — ABNORMAL LOW (ref 70–99)
Glucose-Capillary: 79 mg/dL (ref 70–99)
Glucose-Capillary: 90 mg/dL (ref 70–99)

## 2024-04-02 LAB — AMMONIA: Ammonia: <13 umol/L (ref 9–35)

## 2024-04-02 LAB — PRO BRAIN NATRIURETIC PEPTIDE: Pro Brain Natriuretic Peptide: 311 pg/mL — ABNORMAL HIGH (ref <300.0)

## 2024-04-02 LAB — CBG MONITORING, ED: Glucose-Capillary: 83 mg/dL (ref 70–99)

## 2024-04-02 MED ORDER — ENSURE PLUS HIGH PROTEIN PO LIQD
237.0000 mL | Freq: Three times a day (TID) | ORAL | Status: DC
  Administered 2024-04-02 – 2024-04-06 (×7): 237 mL via ORAL

## 2024-04-02 MED ORDER — OXYCODONE HCL ER 15 MG PO T12A
15.0000 mg | EXTENDED_RELEASE_TABLET | Freq: Two times a day (BID) | ORAL | Status: DC
  Administered 2024-04-02 – 2024-04-09 (×14): 15 mg via ORAL
  Filled 2024-04-02 (×14): qty 1

## 2024-04-02 MED ORDER — ALBUMIN HUMAN 25 % IV SOLN
25.0000 g | Freq: Once | INTRAVENOUS | Status: AC
  Administered 2024-04-02: 12.5 g via INTRAVENOUS
  Filled 2024-04-02: qty 100

## 2024-04-02 MED ORDER — SODIUM CHLORIDE 0.9 % IV SOLN: Freq: Once | INTRAVENOUS | Status: AC

## 2024-04-02 MED ORDER — NYSTATIN 100000 UNIT/GM EX CREA
TOPICAL_CREAM | Freq: Two times a day (BID) | CUTANEOUS | Status: DC
  Filled 2024-04-02 (×2): qty 30

## 2024-04-02 MED ORDER — HYDROMORPHONE HCL 1 MG/ML IJ SOLN
1.0000 mg | Freq: Once | INTRAMUSCULAR | Status: AC
  Administered 2024-04-02: 1 mg via INTRAVENOUS
  Filled 2024-04-02: qty 1

## 2024-04-02 MED ORDER — ENOXAPARIN SODIUM 40 MG/0.4ML IJ SOSY
40.0000 mg | PREFILLED_SYRINGE | INTRAMUSCULAR | Status: DC
  Administered 2024-04-02 – 2024-04-08 (×7): 40 mg via SUBCUTANEOUS
  Filled 2024-04-02 (×7): qty 0.4

## 2024-04-02 MED ORDER — ONDANSETRON HCL 4 MG PO TABS: 4.0000 mg | ORAL_TABLET | Freq: Four times a day (QID) | ORAL | Status: DC | PRN

## 2024-04-02 MED ORDER — ACETAMINOPHEN 325 MG PO TABS
650.0000 mg | ORAL_TABLET | Freq: Four times a day (QID) | ORAL | Status: DC | PRN
  Filled 2024-04-02: qty 2

## 2024-04-02 MED ORDER — SODIUM CHLORIDE 0.9 % IV BOLUS
500.0000 mL | Freq: Once | INTRAVENOUS | Status: AC
  Administered 2024-04-02: 500 mL via INTRAVENOUS

## 2024-04-02 MED ORDER — ACETAMINOPHEN 650 MG RE SUPP: 650.0000 mg | Freq: Four times a day (QID) | RECTAL | Status: DC | PRN

## 2024-04-02 MED ORDER — IOHEXOL 350 MG/ML SOLN
100.0000 mL | Freq: Once | INTRAVENOUS | Status: AC | PRN
  Administered 2024-04-02: 100 mL via INTRAVENOUS

## 2024-04-02 MED ORDER — INSULIN ASPART 100 UNIT/ML IJ SOLN
0.0000 [IU] | Freq: Every day | INTRAMUSCULAR | Status: DC
  Filled 2024-04-02: qty 0.05

## 2024-04-02 MED ORDER — ONDANSETRON HCL 4 MG/2ML IJ SOLN: 4.0000 mg | Freq: Four times a day (QID) | INTRAMUSCULAR | Status: DC | PRN

## 2024-04-02 MED ORDER — INSULIN ASPART 100 UNIT/ML IJ SOLN
0.0000 [IU] | Freq: Three times a day (TID) | INTRAMUSCULAR | Status: DC
  Administered 2024-04-04: 5 [IU] via SUBCUTANEOUS
  Administered 2024-04-04: 2 [IU] via SUBCUTANEOUS
  Administered 2024-04-04: 3 [IU] via SUBCUTANEOUS
  Filled 2024-04-02: qty 0.15

## 2024-04-02 MED ORDER — HYDROMORPHONE HCL 1 MG/ML IJ SOLN
0.5000 mg | INTRAMUSCULAR | Status: DC | PRN
  Administered 2024-04-02 – 2024-04-04 (×9): 1 mg via INTRAVENOUS
  Filled 2024-04-02 (×10): qty 1

## 2024-04-02 MED ORDER — SODIUM CHLORIDE 1 G PO TABS
1.0000 g | ORAL_TABLET | Freq: Three times a day (TID) | ORAL | Status: DC
  Administered 2024-04-02 – 2024-04-09 (×19): 1 g via ORAL
  Filled 2024-04-02 (×21): qty 1

## 2024-04-02 MED ORDER — ZOLEDRONIC ACID 4 MG/100ML IV SOLN
4.0000 mg | Freq: Once | INTRAVENOUS | Status: AC
  Administered 2024-04-02: 4 mg via INTRAVENOUS
  Filled 2024-04-02: qty 100

## 2024-04-02 MED ORDER — ALBUTEROL SULFATE (2.5 MG/3ML) 0.083% IN NEBU: 2.5000 mg | INHALATION_SOLUTION | RESPIRATORY_TRACT | Status: DC | PRN

## 2024-04-02 MED ORDER — OXYCODONE HCL 5 MG PO TABS
5.0000 mg | ORAL_TABLET | ORAL | Status: DC | PRN
  Administered 2024-04-02 – 2024-04-09 (×4): 5 mg via ORAL
  Filled 2024-04-02 (×4): qty 1

## 2024-04-02 NOTE — Plan of Care (Signed)
  Problem: Education: Goal: Ability to describe self-care measures that may prevent or decrease complications (Diabetes Survival Skills Education) will improve Outcome: Progressing Goal: Individualized Educational Video(s) Outcome: Progressing   Problem: Coping: Goal: Ability to adjust to condition or change in health will improve Outcome: Progressing   Problem: Fluid Volume: Goal: Ability to maintain a balanced intake and output will improve Outcome: Progressing   Problem: Health Behavior/Discharge Planning: Goal: Ability to identify and utilize available resources and services will improve Outcome: Progressing Goal: Ability to manage health-related needs will improve Outcome: Progressing   Problem: Metabolic: Goal: Ability to maintain appropriate glucose levels will improve Outcome: Progressing   Problem: Nutritional: Goal: Maintenance of adequate nutrition will improve Outcome: Progressing Goal: Progress toward achieving an optimal weight will improve Outcome: Progressing   Problem: Skin Integrity: Goal: Risk for impaired skin integrity will decrease Outcome: Progressing   Problem: Tissue Perfusion: Goal: Adequacy of tissue perfusion will improve Outcome: Progressing   Problem: Education: Goal: Knowledge of General Education information will improve Description: Including pain rating scale, medication(s)/side effects and non-pharmacologic comfort measures Outcome: Progressing   Problem: Health Behavior/Discharge Planning: Goal: Ability to manage health-related needs will improve Outcome: Progressing   Problem: Clinical Measurements: Goal: Ability to maintain clinical measurements within normal limits will improve Outcome: Progressing Goal: Will remain free from infection Outcome: Progressing Goal: Diagnostic test results will improve Outcome: Progressing Goal: Respiratory complications will improve Outcome: Progressing Goal: Cardiovascular complication will  be avoided Outcome: Progressing   Problem: Activity: Goal: Risk for activity intolerance will decrease Outcome: Progressing   Problem: Nutrition: Goal: Adequate nutrition will be maintained Outcome: Progressing   Problem: Coping: Goal: Level of anxiety will decrease Outcome: Progressing   Problem: Elimination: Goal: Will not experience complications related to bowel motility Outcome: Progressing Goal: Will not experience complications related to urinary retention Outcome: Progressing

## 2024-04-02 NOTE — Consult Note (Signed)
 Consultation Note Date: 04/02/2024   Patient Name: Bob Ewing  DOB: [birth year]  MRN: [id number]  Age / Sex: 53 y.o., male  PCP: [name], MD Referring Physician: Zella Katha HERO, MD  Reason for Consultation: Establishing goals of care  HPI/Patient Profile: 53 y.o. male admitted on 04/02/2024   Clinical Assessment and Goals of Care: 53 year old gentleman, patient of Dr. 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 was receiving chemotherapy Admitted to hospital medicine service for severe weakness dehydration hyponatremia and failure to thrive Patient sees palliative care at the cancer center and is on long-acting as well as short acting opioids Patient reportedly had been saying at the time of admission that he knows he is dying of cancer and that he is not going to make it Palliative consult for CODE STATUS about goals of care discussions has been requested Oncology input has also been requested Chart reviewed Patient seen and examined Currently in the emergency department  Palliative medicine is specialized medical care for people living with serious illness. It focuses on providing relief from the symptoms and stress of a serious illness. The goal is to improve quality of life for both the patient and the family. Goals of care: Broad aims of medical therapy in relation to the patient's values and preferences. Our aim is to provide medical care aimed at enabling patients to achieve the goals that matter most to them, given the circumstances of their particular medical situation and their constraints.    NEXT OF KIN Has 2 friends listed on contact list.  Patient does not want them contacted.  Patient states that he does not have any family or friends.  SUMMARY OF RECOMMENDATIONS   Attempted goals of care and  CODE STATUS discussions with the patient.  He likely received opioids recently.  Patient awakens and arouses and answers some questions but otherwise is not able to carry on full goals of care discussion or full CODE STATUS discussion.  In any case, discussed with him about considering DO NOT RESUSCITATE/DO NOT INTUBATE.  Explained to him, to the best of my ability, about differences between full code versus DNR/DNI.  Additionally, there is no designated healthcare power of attorney agent.  There are 2 friends on the contact list, patient states he has no family. Full code full scope care for now Palliative care to follow closely throughout this hospitalization Agree with seeking oncology input Thank you for the consult.   Code Status/Advance Care Planning: Full code   Symptom Management:     Palliative Prophylaxis:  Delirium Protocol  Additional Recommendations (Limitations, Scope, Preferences): Full Scope Treatment  Psycho-social/Spiritual:  Desire for further Chaplaincy support:yes Additional Recommendations: Caregiving  Support/Resources  Prognosis:  Unable to determine  Discharge Planning: To Be Determined      Primary Diagnoses: Present on Admission:  Hyponatremia   I have reviewed the medical record, interviewed the patient and family, and examined the patient. The following aspects are  pertinent.  Past Medical History:  Diagnosis Date   Cancer (HCC)    Hypertension    Stroke [facility])    Social History   Socioeconomic History   Marital status: Divorced    Spouse name: Not on file   Number of children: 0   Years of education: Not on file   Highest education level: Not on file  Occupational History   Not on file  Tobacco Use   Smoking status: Every Day    Current packs/day: 0.25    Types: Cigarettes   Smokeless tobacco: Never  Vaping Use   Vaping status: Never Used  Substance and Sexual Activity   Alcohol use: Yes   Drug use: Never   Sexual activity:  Not on file  Other Topics Concern   Not on file  Social History Narrative   Not on file   Social Drivers of Health   Financial Resource Strain: Not on file  Food Insecurity: No Food Insecurity (02/06/2024)   Hunger Vital Sign    Worried About Running Out of Food in the Last Year: Never true    Ran Out of Food in the Last Year: Never true  Transportation Needs: No Transportation Needs (03/13/2024)   PRAPARE - Administrator, Civil Service (Medical): No    Lack of Transportation (Non-Medical): No  Physical Activity: Not on file  Stress: Not on file  Social Connections: Not on file   Family History  Problem Relation Age of Onset   Stroke Mother    Diabetes Father    Scheduled Meds:  enoxaparin  (LOVENOX ) injection  40 mg Subcutaneous Q24H   feeding supplement  237 mL Oral TID BM   insulin  aspart  0-15 Units Subcutaneous TID WC   insulin  aspart  0-5 Units Subcutaneous QHS   nystatin  cream   Topical BID   oxyCODONE   15 mg Oral Q12H   sodium chloride   1 g Oral TID WC   Continuous Infusions: PRN Meds:.acetaminophen  **OR** acetaminophen , albuterol , HYDROmorphone  (DILAUDID ) injection, ondansetron  **OR** ondansetron  (ZOFRAN ) IV, oxyCODONE  Medications Prior to Admission:  Prior to Admission medications   Medication Sig Start Date End Date Taking? Authorizing Provider  amLODipine  (NORVASC ) 5 MG tablet Take 1 tablet (5 mg total) by mouth daily. 03/21/24   Heilingoetter, Cassandra L, PA-C  cyclobenzaprine (FLEXERIL) 10 MG tablet Take 10 mg by mouth at bedtime as needed. 12/26/23   [provider]  hydrochlorothiazide  (HYDRODIURIL ) 25 MG tablet Take 1 tablet (25 mg total) by mouth daily. 11/30/23   [name], MD  ibuprofen (ADVIL) 800 MG tablet Take 800 mg by mouth every 6 (six) hours as needed. 10/16/23   [provider]  Lactulose  20 GM/30ML SOLN Take 30 mLs (20 g total) by mouth 3 (three) times daily as needed (take 30 mL up to three times daily as needed  for constipation). 03/21/24 06/20/24  Pickenpack-Cousar, Athena N, NP  lenvatinib  20 mg daily dose (LENVIMA ) 2 x 10 MG capsule Take 2 capsules (20 mg total) by mouth daily. 01/23/24   [name], MD  levothyroxine  (SYNTHROID ) 150 MCG tablet Take 1 tablet (150 mcg total) by mouth daily. 01/16/23   Heilingoetter, Cassandra L, PA-C  metFORMIN (GLUCOPHAGE) 500 MG tablet TAKE 1 TABLET BY MOUTH DAILY 03/12/22   [name], Firas N, MD  metoprolol tartrate (LOPRESSOR) 100 MG tablet Take 100 mg by mouth 2 (two) times daily. 12/26/23   [provider]  ondansetron  (ZOFRAN ) 8 MG tablet Take 1 tablet (8 mg  total) by mouth every 8 (eight) hours as needed for nausea or vomiting. 01/30/24   Heilingoetter, Cassandra L, PA-C  oxyCODONE  ER (XTAMPZA  ER) 13.5 MG C12A Take 13.5 mg by mouth every 12 (twelve) hours. 03/07/24   Pickenpack-Cousar, Athena N, NP  oxyCODONE -acetaminophen  (PERCOCET) 10-325 MG tablet Take 1 tablet by mouth every 6 (six) hours as needed for pain. 03/21/24   Pickenpack-Cousar, Athena N, NP  sodium chloride  1 g tablet Take 1 tablet (1 g total) by mouth 2-3 times daily with meals. 03/13/24   [name], MD   No Known Allergies Review of Systems Resting in bed Physical Exam Patient recently received pain medication in the emergency department.  Asleep but arousable Denies any acute symptoms Appears to have regular work of breathing No edema Appears thinly built with muscle wasting evident  Vital Signs: BP 95/65 (BP Location: Left Arm)   Pulse 81   Temp 97.6 F (36.4 C) (Oral)   Resp 15   Ht 5' 7 (1.702 m)   Wt 78.9 kg   SpO2 100%   BMI 27.25 kg/m  Pain Scale: 0-10   Pain Score: 7    SpO2: SpO2: 100 % O2 Device:SpO2: 100 % O2 Flow Rate: .   IO: Intake/output summary: No intake or output data in the 24 hours ending 04/02/24 1416  LBM:   Baseline Weight: Weight: 78.9 kg Most recent weight: Weight: 78.9 kg     Palliative Assessment/Data:   Palliative performance  scale 50%  Time In: 1320 Time Out: 1420 Time Total: 60 Greater than 50%  of this time was spent counseling and coordinating care related to the above assessment and plan.  Signed by: Lonia Serve, MD   Please contact Palliative Medicine Team phone at [phone] for questions and concerns.  For individual provider: See Tracey

## 2024-04-02 NOTE — Progress Notes (Signed)
 Received notification from LCSW that over the weekend pt had sent a nonsensical email to her and due to the change since last seen in office and her inability to get in contact with the patient, LCSW planned on sending a wellness check to pt house. Pt was then noted to be in the ED, LCSW informed myself and oncology team of this and this RN provided info to ED provider. Will update palliative NP when back in the office, will continue to follow pt chart.

## 2024-04-02 NOTE — ED Provider Notes (Signed)
 [place] EMERGENCY DEPARTMENT AT [facility] Provider Note   CSN: [id number] Arrival date & time: 04/02/24  9372     Patient presents with: Weakness   Bob Ewing is a 53 y.o. male with h/o stage IV renal cancer, hyponatremia on salt tabs presents to the ER today for evaluation of SOB that has been worsening for the past week, but significantly worsening since last night. He reports that he has generalized weakness and cannot move to take care of himself. He reports that his legs have been swollen for months. He also reports that he hasn't been eating or drinking and feels weak because of this as well. He reports his chronic cancer pain to the abdomen and left thigh. Denies any cough, FLS, chest pain, fever, nausea, vomiting, or diarrhea. He reports chronic constipation.   Weakness Associated symptoms: abdominal pain, myalgias and shortness of breath   Associated symptoms: no chest pain, no cough, no diarrhea, no dysuria, no fever, no headaches, no nausea and no vomiting        Prior to Admission medications   Medication Sig Start Date End Date Taking? Authorizing Provider  amLODipine  (NORVASC ) 5 MG tablet Take 1 tablet (5 mg total) by mouth daily. 03/21/24   Heilingoetter, Cassandra L, PA-C  cyclobenzaprine (FLEXERIL) 10 MG tablet Take 10 mg by mouth at bedtime as needed. 12/26/23   [provider]  hydrochlorothiazide  (HYDRODIURIL ) 25 MG tablet Take 1 tablet (25 mg total) by mouth daily. 11/30/23   [name], MD  ibuprofen (ADVIL) 800 MG tablet Take 800 mg by mouth every 6 (six) hours as needed. 10/16/23   [provider]  Lactulose  20 GM/30ML SOLN Take 30 mLs (20 g total) by mouth 3 (three) times daily as needed (take 30 mL up to three times daily as needed for constipation). 03/21/24 06/20/24  Pickenpack-Cousar, Athena N, NP  lenvatinib  20 mg daily dose (LENVIMA ) 2 x 10 MG capsule Take 2 capsules (20 mg total) by mouth daily. 01/23/24   [name], MD   levothyroxine  (SYNTHROID ) 150 MCG tablet Take 1 tablet (150 mcg total) by mouth daily. 01/16/23   Heilingoetter, Cassandra L, PA-C  metFORMIN (GLUCOPHAGE) 500 MG tablet TAKE 1 TABLET BY MOUTH DAILY 03/12/22   [name], Firas N, MD  metoprolol tartrate (LOPRESSOR) 100 MG tablet Take 100 mg by mouth 2 (two) times daily. 12/26/23   [provider]  ondansetron  (ZOFRAN ) 8 MG tablet Take 1 tablet (8 mg total) by mouth every 8 (eight) hours as needed for nausea or vomiting. 01/30/24   Heilingoetter, Cassandra L, PA-C  oxyCODONE  ER (XTAMPZA  ER) 13.5 MG C12A Take 13.5 mg by mouth every 12 (twelve) hours. 03/07/24   Pickenpack-Cousar, Athena N, NP  oxyCODONE -acetaminophen  (PERCOCET) 10-325 MG tablet Take 1 tablet by mouth every 6 (six) hours as needed for pain. 03/21/24   Pickenpack-Cousar, Fannie SAILOR, NP  sodium chloride  1 g tablet Take 1 tablet (1 g total) by mouth 2-3 times daily with meals. 03/13/24   [name], MD    Allergies: Patient has no known allergies.    Review of Systems  Constitutional:  Positive for activity change and appetite change. Negative for chills and fever.  HENT:  Negative for congestion and rhinorrhea.   Respiratory:  Positive for shortness of breath. Negative for cough.   Cardiovascular:  Positive for leg swelling. Negative for chest pain.  Gastrointestinal:  Positive for abdominal pain and constipation. Negative for diarrhea, nausea and vomiting.  Genitourinary:  Negative  for dysuria and hematuria.  Musculoskeletal:  Positive for myalgias.  Neurological:  Positive for weakness. Negative for headaches.    Updated Vital Signs BP 113/66   Pulse 84   Temp 98.6 F (37 C) (Rectal)   Resp 12   Ht 5' 7 (1.702 m)   Wt 78.9 kg   SpO2 100%   BMI 27.25 kg/m   Physical Exam Vitals and nursing note reviewed. Exam conducted with a chaperone present Denson, RN).  Constitutional:      Appearance: He is ill-appearing.  HENT:     Mouth/Throat:     Mouth: Mucous membranes  are dry.     Comments: Extremely dry mucous membranes Eyes:     General: No scleral icterus. Cardiovascular:     Rate and Rhythm: Normal rate.  Pulmonary:     Effort: Pulmonary effort is normal. No respiratory distress.     Breath sounds: Normal breath sounds.  Abdominal:     Palpations: Abdomen is soft.     Tenderness: There is abdominal tenderness. There is no guarding or rebound.     Comments: Diffuse abdominal tenderness to palpation.   Genitourinary:    Comments: Patient has shallow ulcerations with yeast like appearance discharge present in the bilateral inguinal creases in the groin, the scrotum, perineum, and on the lower buttocks.  No blistering.  No crepitus or swelling. Musculoskeletal:     Right lower leg: Edema present.     Left lower leg: Edema present.     Comments: 3+ doughy edema to the BLE up to the knees. Legs appear and feel symmetric in size, coloration, and temperature. Palpable distal pulses.   Skin:    General: Skin is warm and dry.  Neurological:     Mental Status: He is alert.     Comments: Answering questions appropriately.  Moving all extremities     (all labs ordered are listed, but only abnormal results are displayed) Labs Reviewed  COMPREHENSIVE METABOLIC PANEL WITH GFR - Abnormal; Notable for the following components:      Result Value   Sodium 125 (*)    Chloride 85 (*)    CO2 20 (*)    Creatinine, Ser 0.60 (*)    Calcium 11.7 (*)    Alkaline Phosphatase 138 (*)    Anion gap 20 (*)    All other components within normal limits  CBC - Abnormal; Notable for the following components:   RBC 3.07 (*)    Hemoglobin 8.9 (*)    HCT 27.9 (*)    nRBC 0.6 (*)    All other components within normal limits  PRO BRAIN NATRIURETIC PEPTIDE - Abnormal; Notable for the following components:   Pro Brain Natriuretic Peptide 311.0 (*)    All other components within normal limits  AMMONIA  URINALYSIS, ROUTINE W REFLEX MICROSCOPIC  CBG MONITORING, ED  I-STAT  CHEM 8, ED  TYPE AND SCREEN  ABO/RH  TROPONIN T, HIGH SENSITIVITY  TROPONIN T, HIGH SENSITIVITY    EKG: EKG Interpretation Date/Time:  Tuesday April 02 2024 06:48:06 EDT Ventricular Rate:  86 PR Interval:  132 QRS Duration:  109 QT Interval:  369 QTC Calculation: 442 R Axis:   55  Text Interpretation: Sinus rhythm Abnormal R-wave progression, early transition Minimal ST elevation, inferior leads no significant change since Mar 2022 Confirmed by Freddi Hamilton [phone]) on 04/02/2024 7:05:44 AM  Radiology: CT Angio Chest PE W and/or Wo Contrast Result Date: 04/02/2024 CLINICAL DATA:  Stage IV renal cell  carcinoma. Weakness over last 2 months. Swelling in bilateral lower extremities. Decreased p.o. intake. * Tracking Code: BO * EXAM: CT ANGIOGRAPHY CHEST CT ABDOMEN AND PELVIS WITH CONTRAST TECHNIQUE: Multidetector CT imaging of the chest was performed using the standard protocol during bolus administration of intravenous contrast. Multiplanar CT image reconstructions and MIPs were obtained to evaluate the vascular anatomy. Multidetector CT imaging of the abdomen and pelvis was performed using the standard protocol during bolus administration of intravenous contrast. RADIATION DOSE REDUCTION: This exam was performed according to the departmental dose-optimization program which includes automated exposure control, adjustment of the mA and/or kV according to patient size and/or use of iterative reconstruction technique. CONTRAST:  OMNIPAQUE  IOHEXOL  350 MG/ML SOLN COMPARISON:  Chest radiograph of earlier today. Chest abdomen and pelvic CTs of 03/14/2024. FINDINGS: CTA CHEST FINDINGS Cardiovascular: The quality of this exam for evaluation of pulmonary embolism is good. The bolus is well timed. There is minimal motion degradation. No evidence of pulmonary embolism. Bovine arch. Aortic atherosclerosis. Normal heart size, without pericardial effusion. Mediastinum/Nodes: No supraclavicular  adenopathy. Right paratracheal adenopathy at 2.5 cm on 57/4, similar. Right hilar adenopathy at 1.8 cm on 62/4, poorly evaluated on the prior noncontrast exam. Left suprahilar/AP window node of 11 mm on 59/4, similar. Right retrocrural node of 1.4 cm in 1/20 7/4, similar to 1.5 cm previously. Lungs/Pleura: Trace left pleural fluid is similar. Bilateral pulmonary nodules/metastasis. An index superior segment left lower lobe pulmonary nodule measures 11 mm on 54/12 versus 12 mm on the prior. Slightly less well-defined today. Medial right apical nodule measures 1.9 x 1.5 cm on 35/12 and is similar to on the prior (when remeasured). Musculoskeletal: Lytic lesion within the left humeral head on 24/14 at 2.8 cm. Similar to on the prior, but felt to be increased compared to 06/30/2022. Remote ninth posterior right rib fracture. Review of the MIP images confirms the above findings. CT ABDOMEN and PELVIS FINDINGS Hepatobiliary: Normal liver. Gallbladder sludge or noncalcified stones. Borderline gallbladder distension without specific evidence of acute cholecystitis or biliary duct dilatation. Pancreas: Normal, without mass or ductal dilatation. Spleen: Subcentimeter low-density subcapsular splenic lesions are of doubtful clinical significance. Adrenals/Urinary Tract: Normal right adrenal gland. Complex hypoattenuating right renal lesions including up to 1.3 cm on 23/2. New and increased since the most recent contrast enhanced exam of 01/30/2023. Infiltrative left renal mass with direct extension into the abdominal retroperitoneum again identified. Estimated at 13.4 x 10.5 cm on 39/2. Compare 12.9 x 10.4 cm on the prior exam. Results in moderate left-sided hydronephrosis and decreased left renal function/excretion on delayed images. Normal urinary bladder. Stomach/Bowel: Normal stomach, without wall thickening. Scattered colonic diverticula. Normal terminal ileum. Vascular/Lymphatic: Aortic atherosclerosis. Mass effect upon  the IVC from the infiltrative retroperitoneal mass. No thrombus. Direct tumor extension and/or adenopathy throughout the abdominal retroperitoneum as detailed above. Example nodal conglomerate at the level of the interpolar right kidney measuring 9.5 x 5.1 cm on 43/2. Compare 9.7 x 5.2 cm on the prior, suggesting stability. Pelvic adenopathy, with an index right external iliac node measuring 1.3 cm on 65/2 versus 1.1 cm on the prior. Reproductive: Normal prostate. Inguinal position of the right testicle. Other: Trace pelvic fluid mildly increased. No free intraperitoneal air. Musculoskeletal: Direct tumor extension into the left paraspinous musculature, as evidenced by heterogeneous enhancement including on 33/2. Review of the MIP images confirms the above findings. IMPRESSION: CT CHEST IMPRESSION 1.  No evidence of pulmonary embolism. 2. Similar pulmonary and thoracic nodal metastasis since 03/14/2024.  3. Similar trace left pleural fluid. 4. 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 5.  Aortic Atherosclerosis (ICD10-I70.0). CT ABDOMEN AND PELVIS IMPRESSION 1. Similar appearance of infiltrative left renal mass with direct retroperitoneal and left paraspinous muscular extension. Concurrent or contiguous abdominal retroperitoneal and pelvic adenopathy. 2. No bowel obstruction or other superimposed acute process 3. Gallbladder sludge or noncalcified stones with borderline distention but no specific evidence of acute cholecystitis 4.  Aortic Atherosclerosis (ICD10-I70.0). 5. Complex right renal lesions which are new and increased since the most recent contrast-enhanced exam of 2024. Suspicious for metastasis. Electronically Signed   By: Rockey Kilts M.D.   On: 04/02/2024 11:27   CT ABDOMEN PELVIS W CONTRAST Result Date: 04/02/2024 CLINICAL DATA:  Stage IV renal cell carcinoma. Weakness over last 2 months. Swelling in bilateral lower extremities. Decreased p.o. intake. * Tracking Code: BO  * EXAM: CT ANGIOGRAPHY CHEST CT ABDOMEN AND PELVIS WITH CONTRAST TECHNIQUE: Multidetector CT imaging of the chest was performed using the standard protocol during bolus administration of intravenous contrast. Multiplanar CT image reconstructions and MIPs were obtained to evaluate the vascular anatomy. Multidetector CT imaging of the abdomen and pelvis was performed using the standard protocol during bolus administration of intravenous contrast. RADIATION DOSE REDUCTION: This exam was performed according to the departmental dose-optimization program which includes automated exposure control, adjustment of the mA and/or kV according to patient size and/or use of iterative reconstruction technique. CONTRAST:  OMNIPAQUE  IOHEXOL  350 MG/ML SOLN COMPARISON:  Chest radiograph of earlier today. Chest abdomen and pelvic CTs of 03/14/2024. FINDINGS: CTA CHEST FINDINGS Cardiovascular: The quality of this exam for evaluation of pulmonary embolism is good. The bolus is well timed. There is minimal motion degradation. No evidence of pulmonary embolism. Bovine arch. Aortic atherosclerosis. Normal heart size, without pericardial effusion. Mediastinum/Nodes: No supraclavicular adenopathy. Right paratracheal adenopathy at 2.5 cm on 57/4, similar. Right hilar adenopathy at 1.8 cm on 62/4, poorly evaluated on the prior noncontrast exam. Left suprahilar/AP window node of 11 mm on 59/4, similar. Right retrocrural node of 1.4 cm in 1/20 7/4, similar to 1.5 cm previously. Lungs/Pleura: Trace left pleural fluid is similar. Bilateral pulmonary nodules/metastasis. An index superior segment left lower lobe pulmonary nodule measures 11 mm on 54/12 versus 12 mm on the prior. Slightly less well-defined today. Medial right apical nodule measures 1.9 x 1.5 cm on 35/12 and is similar to on the prior (when remeasured). Musculoskeletal: Lytic lesion within the left humeral head on 24/14 at 2.8 cm. Similar to on the prior, but felt to be increased  compared to 06/30/2022. Remote ninth posterior right rib fracture. Review of the MIP images confirms the above findings. CT ABDOMEN and PELVIS FINDINGS Hepatobiliary: Normal liver. Gallbladder sludge or noncalcified stones. Borderline gallbladder distension without specific evidence of acute cholecystitis or biliary duct dilatation. Pancreas: Normal, without mass or ductal dilatation. Spleen: Subcentimeter low-density subcapsular splenic lesions are of doubtful clinical significance. Adrenals/Urinary Tract: Normal right adrenal gland. Complex hypoattenuating right renal lesions including up to 1.3 cm on 23/2. New and increased since the most recent contrast enhanced exam of 01/30/2023. Infiltrative left renal mass with direct extension into the abdominal retroperitoneum again identified. Estimated at 13.4 x 10.5 cm on 39/2. Compare 12.9 x 10.4 cm on the prior exam. Results in moderate left-sided hydronephrosis and decreased left renal function/excretion on delayed images. Normal urinary bladder. Stomach/Bowel: Normal stomach, without wall thickening. Scattered colonic diverticula. Normal terminal ileum. Vascular/Lymphatic: Aortic atherosclerosis. Mass effect upon the IVC from  the infiltrative retroperitoneal mass. No thrombus. Direct tumor extension and/or adenopathy throughout the abdominal retroperitoneum as detailed above. Example nodal conglomerate at the level of the interpolar right kidney measuring 9.5 x 5.1 cm on 43/2. Compare 9.7 x 5.2 cm on the prior, suggesting stability. Pelvic adenopathy, with an index right external iliac node measuring 1.3 cm on 65/2 versus 1.1 cm on the prior. Reproductive: Normal prostate. Inguinal position of the right testicle. Other: Trace pelvic fluid mildly increased. No free intraperitoneal air. Musculoskeletal: Direct tumor extension into the left paraspinous musculature, as evidenced by heterogeneous enhancement including on 33/2. Review of the MIP images confirms the above  findings. IMPRESSION: CT CHEST IMPRESSION 1.  No evidence of pulmonary embolism. 2. Similar pulmonary and thoracic nodal metastasis since 03/14/2024. 3. Similar trace left pleural fluid. 4. 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 5.  Aortic Atherosclerosis (ICD10-I70.0). CT ABDOMEN AND PELVIS IMPRESSION 1. Similar appearance of infiltrative left renal mass with direct retroperitoneal and left paraspinous muscular extension. Concurrent or contiguous abdominal retroperitoneal and pelvic adenopathy. 2. No bowel obstruction or other superimposed acute process 3. Gallbladder sludge or noncalcified stones with borderline distention but no specific evidence of acute cholecystitis 4.  Aortic Atherosclerosis (ICD10-I70.0). 5. Complex right renal lesions which are new and increased since the most recent contrast-enhanced exam of 2024. Suspicious for metastasis. Electronically Signed   By: Rockey Kilts M.D.   On: 04/02/2024 11:27   DG Chest Portable 1 View Result Date: 04/02/2024 CLINICAL DATA:  Shortness of breath. EXAM: PORTABLE CHEST 1 VIEW COMPARISON:  10/15/2020 FINDINGS: No evidence for focal lung consolidation. No pulmonary edema or pleural effusion. Scattered bilateral pulmonary nodules better characterized on recent chest CT from 03/14/2024. The cardiopericardial silhouette is within normal limits for size. No acute bony abnormality. Telemetry leads overlie the chest. IMPRESSION: 1. No acute cardiopulmonary findings. 2. Scattered bilateral pulmonary nodules better characterized on recent chest CT. Electronically Signed   By: Camellia Candle M.D.   On: 04/02/2024 07:19   Procedures   Medications Ordered in the ED  iohexol  (OMNIPAQUE ) 350 MG/ML injection 100 mL (has no administration in time range)  HYDROmorphone  (DILAUDID ) injection 1 mg (1 mg Intravenous Given 04/02/24 0742)  sodium chloride  0.9 % bolus 500 mL (500 mLs Intravenous New Bag/Given 04/02/24 0902)    Clinical Course as  of 04/03/24 1835  Tue Apr 02, 2024  1153 Spoke with Dr. Gatha with oncology.  I have ordered the patient Zometa  for his hypercalcemia which is likely secondary to his malignancy.  He agrees with admission and thinks the patient should be still receiving IV fluids due to his dehydration.  He supports possible albumin  as well.  He will see the patient inpatient on consultation to discuss imaging reports and goals of care. [RR]    Clinical Course User Index [RR] Bernis Ernst, PA-C   Medical Decision Making Amount and/or Complexity of Data Reviewed Labs: ordered. Radiology: ordered.  Risk Prescription drug management. Decision regarding hospitalization.   53 y.o. male presents to the ER for evaluation of multiple symptoms. Differential diagnosis includes but is not limited to CHF, pericardial effusion/tamponade, arrhythmias, ACS, COPD, asthma, bronchitis, pneumonia, pneumothorax, PE, anemia, electrolyte abnormality, malignant pain. Vital signs mildly hypotensive otherwise unremarkable. Physical exam as noted above.   From reading patient's previous oncology notes.  He is on palliative care.  The wounds present in his groin were noted on his last oncology visit as well.  Request was for PCP to further evaluate  this.  I have ordered the patient multiple rounds of pain medication.  I given him a small bolus of fluid however do feel he has third spacing but he appears clinically dry.  Will give him 500 mm bolus and continue with infusion however I do feel like patient could benefit from albumin .  I independently reviewed and interpreted the patient's labs.  CBC shows worsening anemia with hemoglobin 7.9.  No leukocytosis.  CMP shows sodium at a baseline of 125, chloride 85, bicarb of 20, with creatinine of 0.6.  Calcium elevated at 11.7.  Alk phos at 138 which is unchanged or previous.  Anion gap of 20.  Troponin at 16 with repeat at 18.  Ammonia undetectable.  proBNP elevated 311  minimally.  CTA chest  1.  No evidence of pulmonary embolism. 2. Similar pulmonary and thoracic nodal metastasis since 03/14/2024. 3. Similar trace left pleural fluid. 4. 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 5.  Aortic Atherosclerosis  CT Abd Pelvis  1. Similar appearance of infiltrative left renal mass with direct retroperitoneal and left paraspinous muscular extension. Concurrent or contiguous abdominal retroperitoneal and pelvic adenopathy. 2. No bowel obstruction or other superimposed acute process 3. Gallbladder sludge or noncalcified stones with borderline distention but no specific evidence of acute cholecystitis 4.  Aortic Atherosclerosis (ICD10-I70.0). 5. Complex right renal lesions which are new and increased since the most recent contrast-enhanced exam of 2024. Suspicious for metastasis.  His Alk phos is unchanged and his billirubin is unremarkable.  I consulted oncology, please see ED course above.  Social work and palliative care nurse messaged me the patient was also having some altered mental status this weekend he was leaving concerning voicemails.  Patient has been oriented here and is answering questions appropriately.  The patient has already received CT contrast and will need CT head during admission.  He is likely experiencing the symptoms from his worsening malignancy.  I have ordered the patient Zometa  for his hypercalcemia.  I do like the patient to benefit from albumin  as he is third spacing his fluid but needs intravascular replacement and hydration.  Will admit to [facility] hospitalist.   Portions of this report may have been transcribed using voice recognition software. Every effort was made to ensure accuracy; however, inadvertent computerized transcription errors may be present.    Final diagnoses:  Intractable pain  Failure to thrive in adult  Hyponatremia  Dehydration  Generalized weakness    ED Discharge Orders     None           Bernis Ernst, PA-C 04/03/24 1851    Freddi Hamilton, MD 04/07/24 2235

## 2024-04-02 NOTE — H&P (Signed)
 History and Physical  Bob Ewing FMW:[id number] DOB: 1970/11/20 DOA: 04/02/2024  PCP: [name], MD   Chief Complaint: severe weakness   HPI: Bob Ewing is a 53 y.o. 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 Gatha being admitted to the hospital with severe weakness, dehydration, hyponatremia and failure to thrive.  Patient tells me that he knows he is dying of cancer, and is not going to make it.  He has had progressive weakness, he is hardly able to get himself out of bed, feels extremely dehydrated.  He does not report any significant pain, he has been on long-acting and short acting narcotics, followed by palliative care as an outpatient as well.  On evaluation in the emergency department, patient was noted to have continued hyponatremia, he looks incredibly pale and weak but surprisingly his hemoglobin is 8.9.  In the emergency department, ER provider discussed with his oncologist Dr. Gatha, who agrees with hospital admission, goals of care discussion, and will follow the patient in the hospital.  Review of Systems: Please see HPI for pertinent positives and negatives. A complete 10 system review of systems are otherwise negative.  Past Medical History:  Diagnosis Date   Cancer (HCC)    Hypertension    Stroke [facility])    History reviewed. No pertinent surgical history. Social History:  reports that he has been smoking cigarettes. He has never used smokeless tobacco. He reports current alcohol use. He reports that he does not use drugs.  No Known Allergies  Family History  Problem Relation Age of Onset   Stroke Mother    Diabetes Father      Prior to Admission medications   Medication Sig Start Date End Date Taking? Authorizing Provider  amLODipine  (NORVASC ) 5 MG tablet Take 1 tablet (5 mg total) by mouth daily. 03/21/24   Heilingoetter, Cassandra L, PA-C  cyclobenzaprine  (FLEXERIL) 10 MG tablet Take 10 mg by mouth at bedtime as needed. 12/26/23   [provider]  hydrochlorothiazide  (HYDRODIURIL ) 25 MG tablet Take 1 tablet (25 mg total) by mouth daily. 11/30/23   [name], MD  ibuprofen (ADVIL) 800 MG tablet Take 800 mg by mouth every 6 (six) hours as needed. 10/16/23   [provider]  Lactulose  20 GM/30ML SOLN Take 30 mLs (20 g total) by mouth 3 (three) times daily as needed (take 30 mL up to three times daily as needed for constipation). 03/21/24 06/20/24  Pickenpack-Cousar, Athena N, NP  lenvatinib  20 mg daily dose (LENVIMA ) 2 x 10 MG capsule Take 2 capsules (20 mg total) by mouth daily. 01/23/24   [name], MD  levothyroxine  (SYNTHROID ) 150 MCG tablet Take 1 tablet (150 mcg total) by mouth daily. 01/16/23   Heilingoetter, Cassandra L, PA-C  metFORMIN (GLUCOPHAGE) 500 MG tablet TAKE 1 TABLET BY MOUTH DAILY 03/12/22   [name], Firas N, MD  metoprolol tartrate (LOPRESSOR) 100 MG tablet Take 100 mg by mouth 2 (two) times daily. 12/26/23   [provider]  ondansetron  (ZOFRAN ) 8 MG tablet Take 1 tablet (8 mg total) by mouth every 8 (eight) hours as needed for nausea or vomiting. 01/30/24   Heilingoetter, Cassandra L, PA-C  oxyCODONE  ER (XTAMPZA  ER) 13.5 MG C12A Take 13.5 mg by mouth every 12 (twelve) hours. 03/07/24   Pickenpack-Cousar, Athena N, NP  oxyCODONE -acetaminophen  (PERCOCET) 10-325 MG tablet Take 1 tablet by mouth every 6 (six) hours as needed for pain.  03/21/24   Pickenpack-Cousar, Fannie SAILOR, NP  sodium chloride  1 g tablet Take 1 tablet (1 g total) by mouth 2-3 times daily with meals. 03/13/24   [name], MD    Physical Exam: BP 95/65 (BP Location: Left Arm)   Pulse 81   Temp 97.6 F (36.4 C) (Oral)   Resp 15   Ht 5' 7 (1.702 m)   Wt 78.9 kg   SpO2 100%   BMI 27.25 kg/m  General: Patient looks emaciated and chronically ill, not in any acute distress.  He appears to be overall volume overloaded, but  intravascularly depleted.  His mouth is incredibly dry. Cardiovascular: RRR, no murmurs or rubs, 3+ peripheral edema in the bilateral lower extremities Respiratory: clear to auscultation bilaterally, no wheezes, no crackles  Abdomen: soft, nontender, nondistended, normal bowel tones heard  Skin: very dry, with fungal appearing patches on his forearms Musculoskeletal: no joint effusions, normal range of motion  Psychiatric: appropriate affect, normal speech  Neurologic: extraocular muscles intact, clear speech, moving all extremities with intact sensorium         Labs on Admission:  Basic Metabolic Panel: Recent Labs  Lab 04/02/24 0643  NA 125*  K 4.4  CL 85*  CO2 20*  GLUCOSE 88  BUN 14  CREATININE 0.60*  CALCIUM 11.7*   Liver Function Tests: Recent Labs  Lab 04/02/24 0643  AST 37  ALT 25  ALKPHOS 138*  BILITOT 0.6  PROT 7.4  ALBUMIN  3.5   No results for input(s): LIPASE, AMYLASE in the last 168 hours. Recent Labs  Lab 04/02/24 0739  AMMONIA <13   CBC: Recent Labs  Lab 04/02/24 0643  WBC 7.9  HGB 8.9*  HCT 27.9*  MCV 90.9  PLT 284   Cardiac Enzymes: No results for input(s): CKTOTAL, CKMB, CKMBINDEX, TROPONINI in the last 168 hours. BNP (last 3 results) No results for input(s): BNP in the last 8760 hours.  ProBNP (last 3 results) Recent Labs    04/02/24 0706  PROBNP 311.0*    CBG: Recent Labs  Lab 04/02/24 0859  GLUCAP 83    Radiological Exams on Admission: CT Angio Chest PE W and/or Wo Contrast Result Date: 04/02/2024 CLINICAL DATA:  Stage IV renal cell carcinoma. Weakness over last 2 months. Swelling in bilateral lower extremities. Decreased p.o. intake. * Tracking Code: BO * EXAM: CT ANGIOGRAPHY CHEST CT ABDOMEN AND PELVIS WITH CONTRAST TECHNIQUE: Multidetector CT imaging of the chest was performed using the standard protocol during bolus administration of intravenous contrast. Multiplanar CT image reconstructions and MIPs were  obtained to evaluate the vascular anatomy. Multidetector CT imaging of the abdomen and pelvis was performed using the standard protocol during bolus administration of intravenous contrast. RADIATION DOSE REDUCTION: This exam was performed according to the departmental dose-optimization program which includes automated exposure control, adjustment of the mA and/or kV according to patient size and/or use of iterative reconstruction technique. CONTRAST:  OMNIPAQUE  IOHEXOL  350 MG/ML SOLN COMPARISON:  Chest radiograph of earlier today. Chest abdomen and pelvic CTs of 03/14/2024. FINDINGS: CTA CHEST FINDINGS Cardiovascular: The quality of this exam for evaluation of pulmonary embolism is good. The bolus is well timed. There is minimal motion degradation. No evidence of pulmonary embolism. Bovine arch. Aortic atherosclerosis. Normal heart size, without pericardial effusion. Mediastinum/Nodes: No supraclavicular adenopathy. Right paratracheal adenopathy at 2.5 cm on 57/4, similar. Right hilar adenopathy at 1.8 cm on 62/4, poorly evaluated on the prior noncontrast exam. Left suprahilar/AP window node of 11 mm  on 59/4, similar. Right retrocrural node of 1.4 cm in 1/20 7/4, similar to 1.5 cm previously. Lungs/Pleura: Trace left pleural fluid is similar. Bilateral pulmonary nodules/metastasis. An index superior segment left lower lobe pulmonary nodule measures 11 mm on 54/12 versus 12 mm on the prior. Slightly less well-defined today. Medial right apical nodule measures 1.9 x 1.5 cm on 35/12 and is similar to on the prior (when remeasured). Musculoskeletal: Lytic lesion within the left humeral head on 24/14 at 2.8 cm. Similar to on the prior, but felt to be increased compared to 06/30/2022. Remote ninth posterior right rib fracture. Review of the MIP images confirms the above findings. CT ABDOMEN and PELVIS FINDINGS Hepatobiliary: Normal liver. Gallbladder sludge or noncalcified stones. Borderline gallbladder distension  without specific evidence of acute cholecystitis or biliary duct dilatation. Pancreas: Normal, without mass or ductal dilatation. Spleen: Subcentimeter low-density subcapsular splenic lesions are of doubtful clinical significance. Adrenals/Urinary Tract: Normal right adrenal gland. Complex hypoattenuating right renal lesions including up to 1.3 cm on 23/2. New and increased since the most recent contrast enhanced exam of 01/30/2023. Infiltrative left renal mass with direct extension into the abdominal retroperitoneum again identified. Estimated at 13.4 x 10.5 cm on 39/2. Compare 12.9 x 10.4 cm on the prior exam. Results in moderate left-sided hydronephrosis and decreased left renal function/excretion on delayed images. Normal urinary bladder. Stomach/Bowel: Normal stomach, without wall thickening. Scattered colonic diverticula. Normal terminal ileum. Vascular/Lymphatic: Aortic atherosclerosis. Mass effect upon the IVC from the infiltrative retroperitoneal mass. No thrombus. Direct tumor extension and/or adenopathy throughout the abdominal retroperitoneum as detailed above. Example nodal conglomerate at the level of the interpolar right kidney measuring 9.5 x 5.1 cm on 43/2. Compare 9.7 x 5.2 cm on the prior, suggesting stability. Pelvic adenopathy, with an index right external iliac node measuring 1.3 cm on 65/2 versus 1.1 cm on the prior. Reproductive: Normal prostate. Inguinal position of the right testicle. Other: Trace pelvic fluid mildly increased. No free intraperitoneal air. Musculoskeletal: Direct tumor extension into the left paraspinous musculature, as evidenced by heterogeneous enhancement including on 33/2. Review of the MIP images confirms the above findings. IMPRESSION: CT CHEST IMPRESSION 1.  No evidence of pulmonary embolism. 2. Similar pulmonary and thoracic nodal metastasis since 03/14/2024. 3. Similar trace left pleural fluid. 4. Suspect left proximal humerus metastasis, progressive since  06/30/2022. 5.  Aortic Atherosclerosis (ICD10-I70.0). CT ABDOMEN AND PELVIS IMPRESSION 1. Similar appearance of infiltrative left renal mass with direct retroperitoneal and left paraspinous muscular extension. Concurrent or contiguous abdominal retroperitoneal and pelvic adenopathy. 2. No bowel obstruction or other superimposed acute process 3. Gallbladder sludge or noncalcified stones with borderline distention but no specific evidence of acute cholecystitis 4.  Aortic Atherosclerosis (ICD10-I70.0). 5. Complex right renal lesions which are new and increased since the most recent contrast-enhanced exam of 2024. Suspicious for metastasis. Electronically Signed   By: Rockey Kilts M.D.   On: 04/02/2024 11:27   CT ABDOMEN PELVIS W CONTRAST Result Date: 04/02/2024 CLINICAL DATA:  Stage IV renal cell carcinoma. Weakness over last 2 months. Swelling in bilateral lower extremities. Decreased p.o. intake. * Tracking Code: BO * EXAM: CT ANGIOGRAPHY CHEST CT ABDOMEN AND PELVIS WITH CONTRAST TECHNIQUE: Multidetector CT imaging of the chest was performed using the standard protocol during bolus administration of intravenous contrast. Multiplanar CT image reconstructions and MIPs were obtained to evaluate the vascular anatomy. Multidetector CT imaging of the abdomen and pelvis was performed using the standard protocol during bolus administration of intravenous contrast. RADIATION DOSE  REDUCTION: This exam was performed according to the departmental dose-optimization program which includes automated exposure control, adjustment of the mA and/or kV according to patient size and/or use of iterative reconstruction technique. CONTRAST:  OMNIPAQUE  IOHEXOL  350 MG/ML SOLN COMPARISON:  Chest radiograph of earlier today. Chest abdomen and pelvic CTs of 03/14/2024. FINDINGS: CTA CHEST FINDINGS Cardiovascular: The quality of this exam for evaluation of pulmonary embolism is good. The bolus is well timed. There is minimal motion  degradation. No evidence of pulmonary embolism. Bovine arch. Aortic atherosclerosis. Normal heart size, without pericardial effusion. Mediastinum/Nodes: No supraclavicular adenopathy. Right paratracheal adenopathy at 2.5 cm on 57/4, similar. Right hilar adenopathy at 1.8 cm on 62/4, poorly evaluated on the prior noncontrast exam. Left suprahilar/AP window node of 11 mm on 59/4, similar. Right retrocrural node of 1.4 cm in 1/20 7/4, similar to 1.5 cm previously. Lungs/Pleura: Trace left pleural fluid is similar. Bilateral pulmonary nodules/metastasis. An index superior segment left lower lobe pulmonary nodule measures 11 mm on 54/12 versus 12 mm on the prior. Slightly less well-defined today. Medial right apical nodule measures 1.9 x 1.5 cm on 35/12 and is similar to on the prior (when remeasured). Musculoskeletal: Lytic lesion within the left humeral head on 24/14 at 2.8 cm. Similar to on the prior, but felt to be increased compared to 06/30/2022. Remote ninth posterior right rib fracture. Review of the MIP images confirms the above findings. CT ABDOMEN and PELVIS FINDINGS Hepatobiliary: Normal liver. Gallbladder sludge or noncalcified stones. Borderline gallbladder distension without specific evidence of acute cholecystitis or biliary duct dilatation. Pancreas: Normal, without mass or ductal dilatation. Spleen: Subcentimeter low-density subcapsular splenic lesions are of doubtful clinical significance. Adrenals/Urinary Tract: Normal right adrenal gland. Complex hypoattenuating right renal lesions including up to 1.3 cm on 23/2. New and increased since the most recent contrast enhanced exam of 01/30/2023. Infiltrative left renal mass with direct extension into the abdominal retroperitoneum again identified. Estimated at 13.4 x 10.5 cm on 39/2. Compare 12.9 x 10.4 cm on the prior exam. Results in moderate left-sided hydronephrosis and decreased left renal function/excretion on delayed images. Normal urinary bladder.  Stomach/Bowel: Normal stomach, without wall thickening. Scattered colonic diverticula. Normal terminal ileum. Vascular/Lymphatic: Aortic atherosclerosis. Mass effect upon the IVC from the infiltrative retroperitoneal mass. No thrombus. Direct tumor extension and/or adenopathy throughout the abdominal retroperitoneum as detailed above. Example nodal conglomerate at the level of the interpolar right kidney measuring 9.5 x 5.1 cm on 43/2. Compare 9.7 x 5.2 cm on the prior, suggesting stability. Pelvic adenopathy, with an index right external iliac node measuring 1.3 cm on 65/2 versus 1.1 cm on the prior. Reproductive: Normal prostate. Inguinal position of the right testicle. Other: Trace pelvic fluid mildly increased. No free intraperitoneal air. Musculoskeletal: Direct tumor extension into the left paraspinous musculature, as evidenced by heterogeneous enhancement including on 33/2. Review of the MIP images confirms the above findings. IMPRESSION: CT CHEST IMPRESSION 1.  No evidence of pulmonary embolism. 2. Similar pulmonary and thoracic nodal metastasis since 03/14/2024. 3. Similar trace left pleural fluid. 4. 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 5.  Aortic Atherosclerosis (ICD10-I70.0). CT ABDOMEN AND PELVIS IMPRESSION 1. Similar appearance of infiltrative left renal mass with direct retroperitoneal and left paraspinous muscular extension. Concurrent or contiguous abdominal retroperitoneal and pelvic adenopathy. 2. No bowel obstruction or other superimposed acute process 3. Gallbladder sludge or noncalcified stones with borderline distention but no specific evidence of acute cholecystitis 4.  Aortic Atherosclerosis (ICD10-I70.0). 5. Complex right renal  lesions which are new and increased since the most recent contrast-enhanced exam of 2024. Suspicious for metastasis. Electronically Signed   By: Rockey Kilts M.D.   On: 04/02/2024 11:27   DG Chest Portable 1 View Result Date:  04/02/2024 CLINICAL DATA:  Shortness of breath. EXAM: PORTABLE CHEST 1 VIEW COMPARISON:  10/15/2020 FINDINGS: No evidence for focal lung consolidation. No pulmonary edema or pleural effusion. Scattered bilateral pulmonary nodules better characterized on recent chest CT from 03/14/2024. The cardiopericardial silhouette is within normal limits for size. No acute bony abnormality. Telemetry leads overlie the chest. IMPRESSION: 1. No acute cardiopulmonary findings. 2. Scattered bilateral pulmonary nodules better characterized on recent chest CT. Electronically Signed   By: Camellia Candle M.D.   On: 04/02/2024 07:19   Assessment/Plan Bob Ewing is a 53 y.o. 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 Gatha being admitted to the hospital with severe weakness, dehydration, hyponatremia and failure to thrive.   Hyponatremia-I think this is a hypovolemic hyponatremia, despite the fact the patient is quite volume overloaded and third spacing.  Although his albumin  level is normal, suspect he is quite malnourished and intravascularly depleted. -Inpatient admission -Continue gentle hydration, given a dose of IV albumin  in the ER -Continue salt tabs  Stage IV malignant neoplasm of the left kidney-currently under the care of Dr. Sherrod receiving chemotherapy.  Patient tells me  I normally not going to make it, I am going to die.  He expresses some agreement with me, when I mentioned that perhaps it is time to focus more on his comfort rather than trying to treat his cancer. -Continue home OxyContin , oxycodone  for moderate pain -IV Dilaudid  for breakthrough pain -Palliative care consult to discuss pain management and goals of care -ER provider also discussed with Dr. Gatha who will follow  Type 2 diabetes-carb modified diet, with moderate dose sliding scale  DVT prophylaxis: Lovenox      Code Status:  Full Code  Consults called: Dr. Gatha oncology, palliative care  Admission status: The appropriate patient status for this patient is INPATIENT. Inpatient status is judged to be reasonable and necessary in order to provide the required intensity of service to ensure the patient's safety. The patient's presenting symptoms, physical exam findings, and initial radiographic and laboratory data in the context of their chronic comorbidities is felt to place them at high risk for further clinical deterioration. Furthermore, it is not anticipated that the patient will be medically stable for discharge from the hospital within 2 midnights of admission.    I certify that at the point of admission it is my clinical judgment that the patient will require inpatient hospital care spanning beyond 2 midnights from the point of admission due to high intensity of service, high risk for further deterioration and high frequency of surveillance required  Time spent: 56 minutes  [employer] CHRISTELLA Gail MD [facility] Pager [phone]  If 7PM-7AM, please contact night-coverage [email or web address] Password [facility]  04/02/2024, 12:22 PM

## 2024-04-02 NOTE — ED Triage Notes (Signed)
 Pt reports having stage 4 kidney cancer starting in February of 2022. Pt reports increased weakness over past 2 months, significantly worse over past couple days. Pt states he can't walk, also reports swelling in bilateral lower extremities. Poor PO intake reported over past several months.

## 2024-04-02 NOTE — ED Triage Notes (Signed)
 Pt also reports SOB with exertion. No SOB at rest noted.

## 2024-04-03 DIAGNOSIS — Z7189 Other specified counseling: Secondary | ICD-10-CM | POA: Diagnosis not present

## 2024-04-03 DIAGNOSIS — Z9221 Personal history of antineoplastic chemotherapy: Secondary | ICD-10-CM

## 2024-04-03 DIAGNOSIS — C642 Malignant neoplasm of left kidney, except renal pelvis: Secondary | ICD-10-CM

## 2024-04-03 DIAGNOSIS — Z515 Encounter for palliative care: Secondary | ICD-10-CM

## 2024-04-03 DIAGNOSIS — E871 Hypo-osmolality and hyponatremia: Secondary | ICD-10-CM | POA: Diagnosis not present

## 2024-04-03 DIAGNOSIS — R531 Weakness: Secondary | ICD-10-CM | POA: Diagnosis not present

## 2024-04-03 LAB — GLUCOSE, CAPILLARY
Glucose-Capillary: 56 mg/dL — ABNORMAL LOW (ref 70–99)
Glucose-Capillary: 63 mg/dL — ABNORMAL LOW (ref 70–99)
Glucose-Capillary: 63 mg/dL — ABNORMAL LOW (ref 70–99)
Glucose-Capillary: 85 mg/dL (ref 70–99)
Glucose-Capillary: 106 mg/dL — ABNORMAL HIGH (ref 70–99)
Glucose-Capillary: 121 mg/dL — ABNORMAL HIGH (ref 70–99)
Glucose-Capillary: 121 mg/dL — ABNORMAL HIGH (ref 70–99)

## 2024-04-03 LAB — CBC
HCT: 24.9 % — ABNORMAL LOW (ref 39.0–52.0)
Hemoglobin: 7.8 g/dL — ABNORMAL LOW (ref 13.0–17.0)
MCH: 30.1 pg (ref 26.0–34.0)
MCHC: 31.3 g/dL (ref 30.0–36.0)
MCV: 96.1 fL (ref 80.0–100.0)
Platelets: 207 K/uL (ref 150–400)
RBC: 2.59 MIL/uL — ABNORMAL LOW (ref 4.22–5.81)
RDW: 15.8 % — ABNORMAL HIGH (ref 11.5–15.5)
WBC: 9.1 K/uL (ref 4.0–10.5)
nRBC: 0.3 % — ABNORMAL HIGH (ref 0.0–0.2)

## 2024-04-03 LAB — BASIC METABOLIC PANEL WITH GFR
Anion gap: 19 — ABNORMAL HIGH (ref 5–15)
BUN: 9 mg/dL (ref 6–20)
CO2: 18 mmol/L — ABNORMAL LOW (ref 22–32)
Calcium: 10.5 mg/dL — ABNORMAL HIGH (ref 8.9–10.3)
Chloride: 91 mmol/L — ABNORMAL LOW (ref 98–111)
Creatinine, Ser: 0.44 mg/dL — ABNORMAL LOW (ref 0.61–1.24)
GFR, Estimated: >60 mL/min (ref >60)
Glucose, Bld: 78 mg/dL (ref 70–99)
Potassium: 4.1 mmol/L (ref 3.5–5.1)
Sodium: 128 mmol/L — ABNORMAL LOW (ref 135–145)

## 2024-04-03 LAB — HIV ANTIBODY (ROUTINE TESTING W REFLEX): HIV Screen 4th Generation wRfx: NONREACTIVE

## 2024-04-03 MED ORDER — NYSTATIN 100000 UNIT/ML MT SUSP
5.0000 mL | Freq: Four times a day (QID) | OROMUCOSAL | Status: DC
  Administered 2024-04-03 – 2024-04-09 (×23): 500000 [IU] via ORAL
  Filled 2024-04-03 (×23): qty 5

## 2024-04-03 MED ORDER — NYSTATIN 100000 UNIT/GM EX CREA
TOPICAL_CREAM | Freq: Two times a day (BID) | CUTANEOUS | Status: DC
  Filled 2024-04-03: qty 30

## 2024-04-03 MED ORDER — GERHARDT'S BUTT CREAM
TOPICAL_CREAM | Freq: Four times a day (QID) | CUTANEOUS | Status: DC
  Filled 2024-04-03: qty 60

## 2024-04-03 MED ORDER — DEXTROSE 5 % IV SOLN: INTRAVENOUS | Status: DC

## 2024-04-03 MED ORDER — SODIUM CHLORIDE 0.9 % IV SOLN: INTRAVENOUS | Status: DC

## 2024-04-03 MED ORDER — GERHARDT'S BUTT CREAM
TOPICAL_CREAM | Freq: Three times a day (TID) | CUTANEOUS | Status: DC
  Administered 2024-04-07: 1 via TOPICAL
  Filled 2024-04-03: qty 60

## 2024-04-03 MED ORDER — CHLORHEXIDINE GLUCONATE CLOTH 2 % EX PADS
6.0000 | MEDICATED_PAD | Freq: Every day | CUTANEOUS | Status: DC
  Administered 2024-04-03 – 2024-04-09 (×7): 6 via TOPICAL

## 2024-04-03 MED ORDER — LEVOTHYROXINE SODIUM 50 MCG PO TABS
150.0000 ug | ORAL_TABLET | Freq: Every day | ORAL | Status: DC
  Administered 2024-04-04 – 2024-04-08 (×5): 150 ug via ORAL
  Filled 2024-04-03 (×5): qty 1

## 2024-04-03 NOTE — Consult Note (Addendum)
 WOC Nurse Consult Note: Reason for Consult: B buttocks, arm wounds  Wound type: 1. Moisture Associated Skin Damage to coccyx/B buttocks/scrotum  ICD-10 CM Codes for Irritant Dermatitis [id number] - Due to friction or contact with body fluids; unspecified 2.  Full thickness R arm unknown etiology vs other dermatologic process  3.  Full thickness groin likely r/t friction and moisture  4.  Full thickness L pretibial area dry scabbed   Pressure Injury POA: all wounds related to moisture and friction, not pressure  Measurement:  see nursing flowsheet  Wound bed: arm wounds vs other process dry scabbed, groin pink moist, coccyx linear red moist, buttocks red moist, scrotum appears to have some tan tissue present  Drainage (amount, consistency, odor) see nursing flowsheet  Periwound: erythema surrounding buttocks  Dressing procedure/placement/frequency:  Cleanse coccyx with Vashe wound cleanser Soila [phone]) do not rinse and allow to air dry. Apply Xeroform gauze Soila [phone]) to wound bed daily and cover with ABD pad or silicone foam.  Cleanse L pretibial wound with Vashe and apply Xeroform gauze Soila [phone]) every other day. Secure with silicone foam.  Cleanse buttocks/scrotum/groin/inner thighs with Vashe, apply Gerhardt's 3 times a day and prn soiling.   R arm is atypical and appears dry and scabbed at this time. Admitting MD has ordered Nystatin  cream 2 times a day, no other care needed.    POC discussed with bedside nurse.  WOC team will not follow. Re-consult if further needs arise.   Thank you,    Powell Bar MSN, RN-BC, [employer]

## 2024-04-03 NOTE — Progress Notes (Signed)
 Hypoglycemic Event  CBG: 56  Treatment: 8 oz juice/soda  Symptoms: Pale  Follow-up CBG: Time:0900 CBG Result:85  Possible Reasons for Event: Inadequate meal intake  Comments/MD notified:MD notified    Gustavo DELENA Dimes

## 2024-04-03 NOTE — Progress Notes (Signed)
 Daily Progress Note   Patient Name: Bob Ewing       Date: 04/03/2024 DOB: 1971-05-10  Age: 53 y.o. MRN#: [id number] Attending Physician: Will Almarie MATSU, MD Primary Care Physician: [name], MD Admit Date: 04/02/2024  Reason for Consultation/Follow-up: Establishing goals of care  Subjective:  Resting in bed, still drowsy appearing, opens eyes and responds some Recalls meeting me in the ED last evening, states he is still thinking about his code status.   Length of Stay: 1  Current Medications: Scheduled Meds:   enoxaparin  (LOVENOX ) injection  40 mg Subcutaneous Q24H   feeding supplement  237 mL Oral TID BM   Gerhardt's butt cream   Topical TID   insulin  aspart  0-15 Units Subcutaneous TID WC   insulin  aspart  0-5 Units Subcutaneous QHS   nystatin   5 mL Oral QID   nystatin  cream   Topical BID   oxyCODONE   15 mg Oral Q12H   sodium chloride   1 g Oral TID WC    Continuous Infusions:  dextrose  75 mL/hr at 04/03/24 0914    PRN Meds: acetaminophen  **OR** acetaminophen , albuterol , HYDROmorphone  (DILAUDID ) injection, ondansetron  **OR** ondansetron  (ZOFRAN ) IV, oxyCODONE   Physical Exam         Appears chronically ill Resting in bed Regular work of breathing No edema Appears pale  Vital Signs: BP (!) 95/52 (BP Location: Right Arm)   Pulse 95   Temp 97.8 F (36.6 C) (Oral)   Resp 18   Ht 5' 7 (1.702 m)   Wt 78.9 kg   SpO2 100%   BMI 27.25 kg/m  SpO2: SpO2: 100 % O2 Device: O2 Device: Room Air O2 Flow Rate:    Intake/output summary:  Intake/Output Summary (Last 24 hours) at 04/03/2024 1148 Last data filed at 04/02/2024 1925 Gross per 24 hour  Intake 491.8 ml  Output --  Net 491.8 ml   LBM: Last BM Date :  (pt unable to recall) Baseline Weight: Weight: 78.9  kg Most recent weight: Weight: 78.9 kg       Palliative Assessment/Data:      Patient Active Problem List   Diagnosis Date Noted   Hyponatremia 04/02/2024   Hyperlipidemia 04/02/2021   Ischemic stroke (HCC)    Large pleural effusion 10/14/2020   Hypertension    Pleural effusion  Hypercalcemia 10/02/2020   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 was receiving chemotherapy Admitted to hospital medicine service for severe weakness dehydration hyponatremia and failure to thrive Patient sees palliative care at the cancer center and is on long-acting as well as short acting opioids Patient reportedly had been saying at the time of admission that he knows he is dying of cancer and that he is not going to make it Palliative consult for CODE STATUS about goals of care discussions has been requested  Assessment:  Functional decline Cancer related cachexia  Recommendations/Plan:  Full Code, Full Scope for now. Agree with seeking med onc input Recommend ongoing palliative support at the cancer center Continue current pain and non-pain symptom management.   Goals of Care and Additional Recommendations: Limitations on Scope of Treatment: Full Scope Treatment  Code Status:    Code Status Orders  (From admission, onward)           Start     Ordered   04/02/24 1221  Full code  Continuous       Question:  By:  Answer:  Consent: discussion documented in EHR   04/02/24 1221           Code Status History     Date Active Date Inactive Code Status Order ID Comments User Context   10/14/2020 0706 10/15/2020 2055 Full Code  [id number]  Charlton Evalene RAMAN, MD ED       Prognosis:  Unable to determine  Discharge Planning: To Be Determined  Care plan was discussed with  patient.  Will also discuss with TRH MD.   Thank you for allowing the Palliative Medicine Team to assist in the care of this patient.  Mod MDM     Greater than 50%  of this time was spent counseling and coordinating care related to the above assessment and plan.  Lonia Serve, MD  Please contact Palliative Medicine Team phone at [phone] for questions and concerns.

## 2024-04-03 NOTE — Progress Notes (Signed)
 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    PRIOR THERAPY:  1) Status post renal biopsy on September 14, 2020 confirming the presence of clear-cell renal cell carcinoma with rhabdoid features. 2) status post induction treatment with immunotherapy with ipilimumab  1 Mg/KG and nivolumab  3 mg/KG every 3 weeks for 4 cycles.  First dose was given on October 02, 2020. 3) Maintenance treatment with nivolumab  480 Mg IV every 4 weeks.  Status post 30 cycles. 3) Cabometyx  40 mg p.o. daily started on October 22, 2023.  This was discontinued after 3 months secondary to disease progression.   CURRENT THERAPY: Systemic therapy with pembrolizumab  200 Mg IV every 3 weeks in addition to lenvatinib  orally. First dose January 29, 2024.  Subjective: The patient is seen and examined today.  He is very drowsy and poorly responsive.  He goes to sleep most of the time but was able to recognize me when I entered the room.  He continues to have pain especially on the left flank area and back.  He was admitted to the hospital with generalized weakness and dehydration.  The patient lives alone and most of his family are in [place].  He was on treatment with Keytruda  and lenvatinib  recently but unfortunately has evidence for disease progression on this regimen.  Objective: Vital signs in last 24 hours: Temp:  [97.5 F (36.4 C)-98.1 F (36.7 C)] 97.7 F (36.5 C) (09/03 1355) Pulse Rate:  [84-97] 97 (09/03 1355) Resp:  [16-20] 20 (09/03 1355) BP: (81-95)/(51-60) 92/52 (09/03 1355) SpO2:  [96 %-100 %] 100 % (09/03 1355)  Intake/Output from previous day: 09/02 0701 - 09/03 0700 In: 491.8 [P.O.:240; I.V.:116; IV Piggyback:135.8] Out: -  Intake/Output this shift: Total I/O In: 407.3 [I.V.:407.3] Out: -   General appearance: distracted, fatigued, and mild distress Resp: clear to  auscultation bilaterally Cardio: regular rate and rhythm, S1, S2 normal, no murmur, click, rub or gallop GI: soft, non-tender; bowel sounds normal; no masses,  no organomegaly Extremities: extremities normal, atraumatic, no cyanosis or edema  Lab Results:  Recent Labs    04/02/24 0643 04/03/24 0500  WBC 7.9 9.1  HGB 8.9* 7.8*  HCT 27.9* 24.9*  PLT 284 207   BMET Recent Labs    04/02/24 0643 04/03/24 0500  NA 125* 128*  K 4.4 4.1  CL 85* 91*  CO2 20* 18*  GLUCOSE 88 78  BUN 14 9  CREATININE 0.60* 0.44*  CALCIUM 11.7* 10.5*    Studies/Results: CT Angio Chest PE W and/or Wo Contrast Result Date: 04/02/2024 CLINICAL DATA:  Stage IV renal cell carcinoma. Weakness over last 2 months. Swelling in bilateral lower extremities. Decreased p.o. intake. * Tracking Code: BO * EXAM: CT ANGIOGRAPHY CHEST CT ABDOMEN AND PELVIS WITH CONTRAST TECHNIQUE: Multidetector CT imaging of the chest was performed using the standard protocol during bolus administration of intravenous contrast. Multiplanar CT image reconstructions and MIPs were obtained to evaluate the vascular anatomy. Multidetector CT imaging of the abdomen and pelvis was performed using the standard protocol during bolus administration of intravenous contrast. RADIATION DOSE REDUCTION: This exam was performed according to the departmental dose-optimization program which includes automated exposure control, adjustment of the mA and/or kV according to patient size and/or use of iterative reconstruction technique. CONTRAST:  OMNIPAQUE  IOHEXOL  350 MG/ML SOLN COMPARISON:  Chest radiograph of earlier today. Chest abdomen and pelvic CTs of 03/14/2024. FINDINGS: CTA  CHEST FINDINGS Cardiovascular: The quality of this exam for evaluation of pulmonary embolism is good. The bolus is well timed. There is minimal motion degradation. No evidence of pulmonary embolism. Bovine arch. Aortic atherosclerosis. Normal heart size, without pericardial effusion.  Mediastinum/Nodes: No supraclavicular adenopathy. Right paratracheal adenopathy at 2.5 cm on 57/4, similar. Right hilar adenopathy at 1.8 cm on 62/4, poorly evaluated on the prior noncontrast exam. Left suprahilar/AP window node of 11 mm on 59/4, similar. Right retrocrural node of 1.4 cm in 1/20 7/4, similar to 1.5 cm previously. Lungs/Pleura: Trace left pleural fluid is similar. Bilateral pulmonary nodules/metastasis. An index superior segment left lower lobe pulmonary nodule measures 11 mm on 54/12 versus 12 mm on the prior. Slightly less well-defined today. Medial right apical nodule measures 1.9 x 1.5 cm on 35/12 and is similar to on the prior (when remeasured). Musculoskeletal: Lytic lesion within the left humeral head on 24/14 at 2.8 cm. Similar to on the prior, but felt to be increased compared to 06/30/2022. Remote ninth posterior right rib fracture. Review of the MIP images confirms the above findings. CT ABDOMEN and PELVIS FINDINGS Hepatobiliary: Normal liver. Gallbladder sludge or noncalcified stones. Borderline gallbladder distension without specific evidence of acute cholecystitis or biliary duct dilatation. Pancreas: Normal, without mass or ductal dilatation. Spleen: Subcentimeter low-density subcapsular splenic lesions are of doubtful clinical significance. Adrenals/Urinary Tract: Normal right adrenal gland. Complex hypoattenuating right renal lesions including up to 1.3 cm on 23/2. New and increased since the most recent contrast enhanced exam of 01/30/2023. Infiltrative left renal mass with direct extension into the abdominal retroperitoneum again identified. Estimated at 13.4 x 10.5 cm on 39/2. Compare 12.9 x 10.4 cm on the prior exam. Results in moderate left-sided hydronephrosis and decreased left renal function/excretion on delayed images. Normal urinary bladder. Stomach/Bowel: Normal stomach, without wall thickening. Scattered colonic diverticula. Normal terminal ileum. Vascular/Lymphatic:  Aortic atherosclerosis. Mass effect upon the IVC from the infiltrative retroperitoneal mass. No thrombus. Direct tumor extension and/or adenopathy throughout the abdominal retroperitoneum as detailed above. Example nodal conglomerate at the level of the interpolar right kidney measuring 9.5 x 5.1 cm on 43/2. Compare 9.7 x 5.2 cm on the prior, suggesting stability. Pelvic adenopathy, with an index right external iliac node measuring 1.3 cm on 65/2 versus 1.1 cm on the prior. Reproductive: Normal prostate. Inguinal position of the right testicle. Other: Trace pelvic fluid mildly increased. No free intraperitoneal air. Musculoskeletal: Direct tumor extension into the left paraspinous musculature, as evidenced by heterogeneous enhancement including on 33/2. Review of the MIP images confirms the above findings. IMPRESSION: CT CHEST IMPRESSION 1.  No evidence of pulmonary embolism. 2. Similar pulmonary and thoracic nodal metastasis since 03/14/2024. 3. Similar trace left pleural fluid. 4. 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 5.  Aortic Atherosclerosis (ICD10-I70.0). CT ABDOMEN AND PELVIS IMPRESSION 1. Similar appearance of infiltrative left renal mass with direct retroperitoneal and left paraspinous muscular extension. Concurrent or contiguous abdominal retroperitoneal and pelvic adenopathy. 2. No bowel obstruction or other superimposed acute process 3. Gallbladder sludge or noncalcified stones with borderline distention but no specific evidence of acute cholecystitis 4.  Aortic Atherosclerosis (ICD10-I70.0). 5. Complex right renal lesions which are new and increased since the most recent contrast-enhanced exam of 2024. Suspicious for metastasis. Electronically Signed   By: Rockey Kilts M.D.   On: 04/02/2024 11:27   CT ABDOMEN PELVIS W CONTRAST Result Date: 04/02/2024 CLINICAL DATA:  Stage IV renal cell carcinoma. Weakness over last 2 months. Swelling in  bilateral lower extremities.  Decreased p.o. intake. * Tracking Code: BO * EXAM: CT ANGIOGRAPHY CHEST CT ABDOMEN AND PELVIS WITH CONTRAST TECHNIQUE: Multidetector CT imaging of the chest was performed using the standard protocol during bolus administration of intravenous contrast. Multiplanar CT image reconstructions and MIPs were obtained to evaluate the vascular anatomy. Multidetector CT imaging of the abdomen and pelvis was performed using the standard protocol during bolus administration of intravenous contrast. RADIATION DOSE REDUCTION: This exam was performed according to the departmental dose-optimization program which includes automated exposure control, adjustment of the mA and/or kV according to patient size and/or use of iterative reconstruction technique. CONTRAST:  OMNIPAQUE  IOHEXOL  350 MG/ML SOLN COMPARISON:  Chest radiograph of earlier today. Chest abdomen and pelvic CTs of 03/14/2024. FINDINGS: CTA CHEST FINDINGS Cardiovascular: The quality of this exam for evaluation of pulmonary embolism is good. The bolus is well timed. There is minimal motion degradation. No evidence of pulmonary embolism. Bovine arch. Aortic atherosclerosis. Normal heart size, without pericardial effusion. Mediastinum/Nodes: No supraclavicular adenopathy. Right paratracheal adenopathy at 2.5 cm on 57/4, similar. Right hilar adenopathy at 1.8 cm on 62/4, poorly evaluated on the prior noncontrast exam. Left suprahilar/AP window node of 11 mm on 59/4, similar. Right retrocrural node of 1.4 cm in 1/20 7/4, similar to 1.5 cm previously. Lungs/Pleura: Trace left pleural fluid is similar. Bilateral pulmonary nodules/metastasis. An index superior segment left lower lobe pulmonary nodule measures 11 mm on 54/12 versus 12 mm on the prior. Slightly less well-defined today. Medial right apical nodule measures 1.9 x 1.5 cm on 35/12 and is similar to on the prior (when remeasured). Musculoskeletal: Lytic lesion within the left humeral head on 24/14 at 2.8 cm.  Similar to on the prior, but felt to be increased compared to 06/30/2022. Remote ninth posterior right rib fracture. Review of the MIP images confirms the above findings. CT ABDOMEN and PELVIS FINDINGS Hepatobiliary: Normal liver. Gallbladder sludge or noncalcified stones. Borderline gallbladder distension without specific evidence of acute cholecystitis or biliary duct dilatation. Pancreas: Normal, without mass or ductal dilatation. Spleen: Subcentimeter low-density subcapsular splenic lesions are of doubtful clinical significance. Adrenals/Urinary Tract: Normal right adrenal gland. Complex hypoattenuating right renal lesions including up to 1.3 cm on 23/2. New and increased since the most recent contrast enhanced exam of 01/30/2023. Infiltrative left renal mass with direct extension into the abdominal retroperitoneum again identified. Estimated at 13.4 x 10.5 cm on 39/2. Compare 12.9 x 10.4 cm on the prior exam. Results in moderate left-sided hydronephrosis and decreased left renal function/excretion on delayed images. Normal urinary bladder. Stomach/Bowel: Normal stomach, without wall thickening. Scattered colonic diverticula. Normal terminal ileum. Vascular/Lymphatic: Aortic atherosclerosis. Mass effect upon the IVC from the infiltrative retroperitoneal mass. No thrombus. Direct tumor extension and/or adenopathy throughout the abdominal retroperitoneum as detailed above. Example nodal conglomerate at the level of the interpolar right kidney measuring 9.5 x 5.1 cm on 43/2. Compare 9.7 x 5.2 cm on the prior, suggesting stability. Pelvic adenopathy, with an index right external iliac node measuring 1.3 cm on 65/2 versus 1.1 cm on the prior. Reproductive: Normal prostate. Inguinal position of the right testicle. Other: Trace pelvic fluid mildly increased. No free intraperitoneal air. Musculoskeletal: Direct tumor extension into the left paraspinous musculature, as evidenced by heterogeneous enhancement including on  33/2. Review of the MIP images confirms the above findings. IMPRESSION: CT CHEST IMPRESSION 1.  No evidence of pulmonary embolism. 2. Similar pulmonary and thoracic nodal metastasis since 03/14/2024. 3. Similar trace left pleural fluid. 4.  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 5.  Aortic Atherosclerosis (ICD10-I70.0). CT ABDOMEN AND PELVIS IMPRESSION 1. Similar appearance of infiltrative left renal mass with direct retroperitoneal and left paraspinous muscular extension. Concurrent or contiguous abdominal retroperitoneal and pelvic adenopathy. 2. No bowel obstruction or other superimposed acute process 3. Gallbladder sludge or noncalcified stones with borderline distention but no specific evidence of acute cholecystitis 4.  Aortic Atherosclerosis (ICD10-I70.0). 5. Complex right renal lesions which are new and increased since the most recent contrast-enhanced exam of 2024. Suspicious for metastasis. Electronically Signed   By: Rockey Kilts M.D.   On: 04/02/2024 11:27   DG Chest Portable 1 View Result Date: 04/02/2024 CLINICAL DATA:  Shortness of breath. EXAM: PORTABLE CHEST 1 VIEW COMPARISON:  10/15/2020 FINDINGS: No evidence for focal lung consolidation. No pulmonary edema or pleural effusion. Scattered bilateral pulmonary nodules better characterized on recent chest CT from 03/14/2024. The cardiopericardial silhouette is within normal limits for size. No acute bony abnormality. Telemetry leads overlie the chest. IMPRESSION: 1. No acute cardiopulmonary findings. 2. Scattered bilateral pulmonary nodules better characterized on recent chest CT. Electronically Signed   By: Camellia Candle M.D.   On: 04/02/2024 07:19    Medications: I have reviewed the patient's current medications.   Assessment/Plan: This is a very pleasant 53 years old white male originally from [place] with stage IV malignant neoplasm of the left kidney initially diagnosed in February 2022 status post several  treatment in the past including immunotherapy as well as Cabometyx  and most recently Keytruda  and lenvatinib .  He had repeat CT scan of the chest, abdomen and pelvis performed recently.  Unfortunately his scan showed worsening disease progression with no response to the recent treatment. I had a lengthy discussion with the patient today about his condition.  I think the patient is dying from his progressive renal cell carcinoma.  He does not have any family around he lives at home by himself.  I do not think it is appropriate to discharge him home even with hospice because of the lack of support.  He may need transfer to a hospice facility for end-of-life care if he survives this admission. For pain management continue with the current pain medication to be adjusted by the palliative care team. Thank you so much for taking good care of Mr. Courington.  Please call if you have any questions.  LOS: 1 day    Sherrod MARLA Sherrod 04/03/2024

## 2024-04-03 NOTE — Inpatient Diabetes Management (Addendum)
 Inpatient Diabetes Program Recommendations  AACE/ADA: New Consensus Statement on Inpatient Glycemic Control (2015)  Target Ranges:  Prepandial:   less than 140 mg/dL      Peak postprandial:   less than 180 mg/dL (1-2 hours)      Critically ill patients:  140 - 180 mg/dL   Lab Results  Component Value Date   GLUCAP 56 (L) 04/03/2024   HGBA1C 5.1 04/02/2024    Review of Glycemic Control  Latest Reference Range & Units 04/02/24 08:59 04/02/24 16:51 04/02/24 17:24 04/02/24 22:27 04/03/24 07:25  Glucose-Capillary 70 - 99 mg/dL 83 65 (L) 79 90 56 (L)   Diabetes history: DM 2? Metformin 500 mg Daily on med rec, no mention of DM in medical history, A1c normal  Current orders for Inpatient glycemic control:  Novolog  0-15 units tid + hs  Inpatient Diabetes Program Recommendations:   Note Hypoglycemia without the use of insulin  Glucose trends below 100  -   consider discontinuing insulin .   Thanks,  Clotilda Bull RN, MSN, BC-ADM Inpatient Diabetes Coordinator Team Pager [phone] (8a-5p)

## 2024-04-03 NOTE — Progress Notes (Signed)
 Chaplain responded to spiritual consult, with the reason listed as palliative care. I introduced myself to pt Bob Ewing who was unable to remain awake for long. I allowed him to rest and assured him of our presence here at the hospital. I asked if there was anything I could do for him at this time, to which he replied I'm good before quickly closing his eyes.   No friends/family bedside currently. Please reach out as needs arise.

## 2024-04-03 NOTE — Progress Notes (Signed)
 PROGRESS NOTE    Bob Ewing  FMW:[id number] DOB: 17-Mar-1971 DOA: 04/02/2024 PCP: [name], MD   Brief Narrative: 53 y.o. 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 Gatha being admitted to the hospital with severe weakness, dehydration, hyponatremia and failure to thrive.  Patient tells me that he knows he is dying of cancer, and is not going to make it.  He has had progressive weakness, he is hardly able to get himself out of bed, feels extremely dehydrated.  He does not report any significant pain, he has been on long-acting and short acting narcotics, followed by palliative care as an outpatient as well.  On evaluation in the emergency department, patient was noted to have continued hyponatremia, he looks incredibly pale and weak but surprisingly his hemoglobin is 8.9.  In the emergency department, ER provider discussed with his oncologist Dr. Gatha, who agrees with hospital admission, goals of care discussion, and will follow the patient in the hospital.    Assessment & Plan:   Principal Problem:   Hyponatremia  #1 hyponatremia-likely mildly factorial,  HCTZ prior to admission, hypovolemic hyponatremia.  Patient was admitted on normal saline, salt tablets with improvement in sodium.  Daily sodium.  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 Sherrod receiving chemotherapy.    #3 type 2 diabetes-blood sugars have been running low will change his diet to regular diet hold metformin insulin  excetra. CBG (last 3)  Recent Labs    04/03/24 0844 04/03/24 0900 04/03/24 1141  GLUCAP 63* 85 106*   #4 hypothyroidism restart Synthroid   #5 hypertension he is on multiple antihypertensives prior to admission which is all on hold.  HCTZ, metoprolol,  Norvasc .  #6 hypercalcemia likely from secondary to malignancy continue IV fluids  Estimated body mass index is 27.25 kg/m as calculated from the following:   Height as of this encounter: 5' 7 (1.702 m).   Weight as of this encounter: 78.9 kg.  DVT prophylaxis: Lovenox   code Status: Full code  Family Communication: None  disposition Plan:  Status is: Inpatient Remains inpatient appropriate because: Acute illness   Consultants:  Oncology  Procedures: None Antimicrobials: None Subjective: Resting in bed moaning in pain very drowsy points to left lower back left flank where it hurts  Objective: Vitals:   04/02/24 2226 04/03/24 0334 04/03/24 0902 04/03/24 1355  BP: (!) 95/58 93/60 (!) 95/52 (!) 92/52  Pulse: 90 88 95 97  Resp: 20 18  20   Temp: 98.1 F (36.7 C) 97.8 F (36.6 C) 97.8 F (36.6 C) 97.7 F (36.5 C)  TempSrc: Oral Oral Oral   SpO2: 96% 100% 100% 100%  Weight:      Height:        Intake/Output Summary (Last 24 hours) at 04/03/2024 1439 Last data filed at 04/02/2024 1925 Gross per 24 hour  Intake 491.8 ml  Output --  Net 491.8 ml   Filed Weights   04/02/24 0639  Weight: 78.9 kg    Examination:  General exam: Appears in distress Respiratory system: Clear to auscultation. Respiratory effort normal. Cardiovascular system: S1 & S2 heard, RRR. No JVD, murmurs, rubs, gallops or clicks. No pedal edema. Gastrointestinal system: Left abdominal tenderness left flank tenderness Central nervous system:  drowsy.  Follows simple commands and answer simple questions Extremities: Edema 1+  Data Reviewed: I have personally reviewed following labs and imaging studies  CBC: Recent Labs  Lab 04/02/24 0643 04/03/24 0500  WBC 7.9 9.1  HGB 8.9* 7.8*  HCT 27.9* 24.9*  MCV 90.9 96.1  PLT 284 207   Basic Metabolic Panel: Recent Labs  Lab 04/02/24 0643 04/03/24 0500  NA 125* 128*  K 4.4 4.1  CL 85* 91*  CO2 20* 18*  GLUCOSE 88 78  BUN 14 9  CREATININE  0.60* 0.44*  CALCIUM 11.7* 10.5*   GFR: Estimated Creatinine Clearance: 101 mL/min (A) (by C-G formula based on SCr of 0.44 mg/dL (L)). Liver Function Tests: Recent Labs  Lab 04/02/24 0643  AST 37  ALT 25  ALKPHOS 138*  BILITOT 0.6  PROT 7.4  ALBUMIN  3.5   No results for input(s): LIPASE, AMYLASE in the last 168 hours. Recent Labs  Lab 04/02/24 0739  AMMONIA <13   Coagulation Profile: No results for input(s): INR, PROTIME in the last 168 hours. Cardiac Enzymes: No results for input(s): CKTOTAL, CKMB, CKMBINDEX, TROPONINI in the last 168 hours. BNP (last 3 results) Recent Labs    04/02/24 0706  PROBNP 311.0*   HbA1C: Recent Labs    04/02/24 1534  HGBA1C 5.1   CBG: Recent Labs  Lab 04/03/24 0725 04/03/24 0753 04/03/24 0844 04/03/24 0900 04/03/24 1141  GLUCAP 56* 63* 63* 85 106*   Lipid Profile: No results for input(s): CHOL, HDL, LDLCALC, TRIG, CHOLHDL, LDLDIRECT in the last 72 hours. Thyroid  Function Tests: No results for input(s): TSH, T4TOTAL, FREET4, T3FREE, THYROIDAB in the last 72 hours. Anemia Panel: No results for input(s): VITAMINB12, FOLATE, FERRITIN, TIBC, IRON, RETICCTPCT in the last 72 hours. Sepsis Labs: No results for input(s): PROCALCITON, LATICACIDVEN in the last 168 hours.  No results found for this or any previous visit (from the past 240 hours).       Radiology Studies: CT Angio Chest PE W and/or Wo Contrast Result Date: 04/02/2024 CLINICAL DATA:  Stage IV renal cell carcinoma. Weakness over last 2 months. Swelling in bilateral lower extremities. Decreased p.o. intake. * Tracking Code: BO * EXAM: CT ANGIOGRAPHY CHEST CT ABDOMEN AND PELVIS WITH CONTRAST TECHNIQUE: Multidetector CT imaging of the chest was performed using the standard protocol during bolus administration of intravenous contrast. Multiplanar CT image reconstructions and MIPs were obtained to evaluate the vascular  anatomy. Multidetector CT imaging of the abdomen and pelvis was performed using the standard protocol during bolus administration of intravenous contrast. RADIATION DOSE REDUCTION: This exam was performed according to the departmental dose-optimization program which includes automated exposure control, adjustment of the mA and/or kV according to patient size and/or use of iterative reconstruction technique. CONTRAST:  OMNIPAQUE  IOHEXOL  350 MG/ML SOLN COMPARISON:  Chest radiograph of earlier today. Chest abdomen and pelvic CTs of 03/14/2024. FINDINGS: CTA CHEST FINDINGS Cardiovascular: The quality of this exam for evaluation of pulmonary embolism is good. The bolus is well timed. There is minimal motion degradation. No evidence of pulmonary embolism. Bovine arch. Aortic atherosclerosis. Normal heart size, without pericardial effusion. Mediastinum/Nodes: No supraclavicular adenopathy. Right paratracheal adenopathy at 2.5 cm on 57/4, similar. Right hilar adenopathy at 1.8 cm on 62/4, poorly evaluated on the prior noncontrast exam. Left suprahilar/AP window node of 11 mm on 59/4, similar. Right retrocrural node of 1.4 cm in 1/20 7/4, similar to 1.5 cm previously. Lungs/Pleura: Trace left pleural fluid is similar. Bilateral pulmonary nodules/metastasis. An index superior segment left lower lobe pulmonary nodule measures 11 mm on 54/12 versus  12 mm on the prior. Slightly less well-defined today. Medial right apical nodule measures 1.9 x 1.5 cm on 35/12 and is similar to on the prior (when remeasured). Musculoskeletal: Lytic lesion within the left humeral head on 24/14 at 2.8 cm. Similar to on the prior, but felt to be increased compared to 06/30/2022. Remote ninth posterior right rib fracture. Review of the MIP images confirms the above findings. CT ABDOMEN and PELVIS FINDINGS Hepatobiliary: Normal liver. Gallbladder sludge or noncalcified stones. Borderline gallbladder distension without specific evidence of acute  cholecystitis or biliary duct dilatation. Pancreas: Normal, without mass or ductal dilatation. Spleen: Subcentimeter low-density subcapsular splenic lesions are of doubtful clinical significance. Adrenals/Urinary Tract: Normal right adrenal gland. Complex hypoattenuating right renal lesions including up to 1.3 cm on 23/2. New and increased since the most recent contrast enhanced exam of 01/30/2023. Infiltrative left renal mass with direct extension into the abdominal retroperitoneum again identified. Estimated at 13.4 x 10.5 cm on 39/2. Compare 12.9 x 10.4 cm on the prior exam. Results in moderate left-sided hydronephrosis and decreased left renal function/excretion on delayed images. Normal urinary bladder. Stomach/Bowel: Normal stomach, without wall thickening. Scattered colonic diverticula. Normal terminal ileum. Vascular/Lymphatic: Aortic atherosclerosis. Mass effect upon the IVC from the infiltrative retroperitoneal mass. No thrombus. Direct tumor extension and/or adenopathy throughout the abdominal retroperitoneum as detailed above. Example nodal conglomerate at the level of the interpolar right kidney measuring 9.5 x 5.1 cm on 43/2. Compare 9.7 x 5.2 cm on the prior, suggesting stability. Pelvic adenopathy, with an index right external iliac node measuring 1.3 cm on 65/2 versus 1.1 cm on the prior. Reproductive: Normal prostate. Inguinal position of the right testicle. Other: Trace pelvic fluid mildly increased. No free intraperitoneal air. Musculoskeletal: Direct tumor extension into the left paraspinous musculature, as evidenced by heterogeneous enhancement including on 33/2. Review of the MIP images confirms the above findings. IMPRESSION: CT CHEST IMPRESSION 1.  No evidence of pulmonary embolism. 2. Similar pulmonary and thoracic nodal metastasis since 03/14/2024. 3. Similar trace left pleural fluid. 4. 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 5.  Aortic Atherosclerosis  (ICD10-I70.0). CT ABDOMEN AND PELVIS IMPRESSION 1. Similar appearance of infiltrative left renal mass with direct retroperitoneal and left paraspinous muscular extension. Concurrent or contiguous abdominal retroperitoneal and pelvic adenopathy. 2. No bowel obstruction or other superimposed acute process 3. Gallbladder sludge or noncalcified stones with borderline distention but no specific evidence of acute cholecystitis 4.  Aortic Atherosclerosis (ICD10-I70.0). 5. Complex right renal lesions which are new and increased since the most recent contrast-enhanced exam of 2024. Suspicious for metastasis. Electronically Signed   By: Rockey Kilts M.D.   On: 04/02/2024 11:27   CT ABDOMEN PELVIS W CONTRAST Result Date: 04/02/2024 CLINICAL DATA:  Stage IV renal cell carcinoma. Weakness over last 2 months. Swelling in bilateral lower extremities. Decreased p.o. intake. * Tracking Code: BO * EXAM: CT ANGIOGRAPHY CHEST CT ABDOMEN AND PELVIS WITH CONTRAST TECHNIQUE: Multidetector CT imaging of the chest was performed using the standard protocol during bolus administration of intravenous contrast. Multiplanar CT image reconstructions and MIPs were obtained to evaluate the vascular anatomy. Multidetector CT imaging of the abdomen and pelvis was performed using the standard protocol during bolus administration of intravenous contrast. RADIATION DOSE REDUCTION: This exam was performed according to the departmental dose-optimization program which includes automated exposure control, adjustment of the mA and/or kV according to patient size and/or use of iterative reconstruction technique. CONTRAST:  OMNIPAQUE  IOHEXOL  350 MG/ML SOLN COMPARISON:  Chest radiograph of earlier today. Chest abdomen and pelvic CTs of 03/14/2024. FINDINGS: CTA CHEST FINDINGS Cardiovascular: The quality of this exam for evaluation of pulmonary embolism is good. The bolus is well timed. There is minimal motion degradation. No evidence of pulmonary  embolism. Bovine arch. Aortic atherosclerosis. Normal heart size, without pericardial effusion. Mediastinum/Nodes: No supraclavicular adenopathy. Right paratracheal adenopathy at 2.5 cm on 57/4, similar. Right hilar adenopathy at 1.8 cm on 62/4, poorly evaluated on the prior noncontrast exam. Left suprahilar/AP window node of 11 mm on 59/4, similar. Right retrocrural node of 1.4 cm in 1/20 7/4, similar to 1.5 cm previously. Lungs/Pleura: Trace left pleural fluid is similar. Bilateral pulmonary nodules/metastasis. An index superior segment left lower lobe pulmonary nodule measures 11 mm on 54/12 versus 12 mm on the prior. Slightly less well-defined today. Medial right apical nodule measures 1.9 x 1.5 cm on 35/12 and is similar to on the prior (when remeasured). Musculoskeletal: Lytic lesion within the left humeral head on 24/14 at 2.8 cm. Similar to on the prior, but felt to be increased compared to 06/30/2022. Remote ninth posterior right rib fracture. Review of the MIP images confirms the above findings. CT ABDOMEN and PELVIS FINDINGS Hepatobiliary: Normal liver. Gallbladder sludge or noncalcified stones. Borderline gallbladder distension without specific evidence of acute cholecystitis or biliary duct dilatation. Pancreas: Normal, without mass or ductal dilatation. Spleen: Subcentimeter low-density subcapsular splenic lesions are of doubtful clinical significance. Adrenals/Urinary Tract: Normal right adrenal gland. Complex hypoattenuating right renal lesions including up to 1.3 cm on 23/2. New and increased since the most recent contrast enhanced exam of 01/30/2023. Infiltrative left renal mass with direct extension into the abdominal retroperitoneum again identified. Estimated at 13.4 x 10.5 cm on 39/2. Compare 12.9 x 10.4 cm on the prior exam. Results in moderate left-sided hydronephrosis and decreased left renal function/excretion on delayed images. Normal urinary bladder. Stomach/Bowel: Normal stomach, without  wall thickening. Scattered colonic diverticula. Normal terminal ileum. Vascular/Lymphatic: Aortic atherosclerosis. Mass effect upon the IVC from the infiltrative retroperitoneal mass. No thrombus. Direct tumor extension and/or adenopathy throughout the abdominal retroperitoneum as detailed above. Example nodal conglomerate at the level of the interpolar right kidney measuring 9.5 x 5.1 cm on 43/2. Compare 9.7 x 5.2 cm on the prior, suggesting stability. Pelvic adenopathy, with an index right external iliac node measuring 1.3 cm on 65/2 versus 1.1 cm on the prior. Reproductive: Normal prostate. Inguinal position of the right testicle. Other: Trace pelvic fluid mildly increased. No free intraperitoneal air. Musculoskeletal: Direct tumor extension into the left paraspinous musculature, as evidenced by heterogeneous enhancement including on 33/2. Review of the MIP images confirms the above findings. IMPRESSION: CT CHEST IMPRESSION 1.  No evidence of pulmonary embolism. 2. Similar pulmonary and thoracic nodal metastasis since 03/14/2024. 3. Similar trace left pleural fluid. 4. 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 5.  Aortic Atherosclerosis (ICD10-I70.0). CT ABDOMEN AND PELVIS IMPRESSION 1. Similar appearance of infiltrative left renal mass with direct retroperitoneal and left paraspinous muscular extension. Concurrent or contiguous abdominal retroperitoneal and pelvic adenopathy. 2. No bowel obstruction or other superimposed acute process 3. Gallbladder sludge or noncalcified stones with borderline distention but no specific evidence of acute cholecystitis 4.  Aortic Atherosclerosis (ICD10-I70.0). 5. Complex right renal lesions which are new and increased since the most recent contrast-enhanced exam of 2024. Suspicious for metastasis. Electronically Signed   By: Rockey Kilts M.D.   On: 04/02/2024 11:27   DG Chest Portable 1 View Result Date: 04/02/2024 CLINICAL  DATA:  Shortness of  breath. EXAM: PORTABLE CHEST 1 VIEW COMPARISON:  10/15/2020 FINDINGS: No evidence for focal lung consolidation. No pulmonary edema or pleural effusion. Scattered bilateral pulmonary nodules better characterized on recent chest CT from 03/14/2024. The cardiopericardial silhouette is within normal limits for size. No acute bony abnormality. Telemetry leads overlie the chest. IMPRESSION: 1. No acute cardiopulmonary findings. 2. Scattered bilateral pulmonary nodules better characterized on recent chest CT. Electronically Signed   By: Camellia Candle M.D.   On: 04/02/2024 07:19   Scheduled Meds:  enoxaparin  (LOVENOX ) injection  40 mg Subcutaneous Q24H   feeding supplement  237 mL Oral TID BM   Gerhardt's butt cream   Topical TID   insulin  aspart  0-15 Units Subcutaneous TID WC   insulin  aspart  0-5 Units Subcutaneous QHS   nystatin   5 mL Oral QID   nystatin  cream   Topical BID   oxyCODONE   15 mg Oral Q12H   sodium chloride   1 g Oral TID WC   Continuous Infusions:  dextrose  75 mL/hr at 04/03/24 0914     LOS: 1 day    Almarie KANDICE Hoots, MD  04/03/2024, 2:39 PM

## 2024-04-04 ENCOUNTER — Inpatient Hospital Stay: Admitting: Internal Medicine

## 2024-04-04 ENCOUNTER — Inpatient Hospital Stay

## 2024-04-04 DIAGNOSIS — E860 Dehydration: Secondary | ICD-10-CM

## 2024-04-04 DIAGNOSIS — R531 Weakness: Secondary | ICD-10-CM | POA: Diagnosis not present

## 2024-04-04 DIAGNOSIS — R627 Adult failure to thrive: Secondary | ICD-10-CM

## 2024-04-04 DIAGNOSIS — E871 Hypo-osmolality and hyponatremia: Secondary | ICD-10-CM | POA: Diagnosis not present

## 2024-04-04 LAB — GLUCOSE, CAPILLARY
Glucose-Capillary: 118 mg/dL — ABNORMAL HIGH (ref 70–99)
Glucose-Capillary: 131 mg/dL — ABNORMAL HIGH (ref 70–99)
Glucose-Capillary: 213 mg/dL — ABNORMAL HIGH (ref 70–99)
Glucose-Capillary: 178 mg/dL — ABNORMAL HIGH (ref 70–99)
Glucose-Capillary: 112 mg/dL — ABNORMAL HIGH (ref 70–99)

## 2024-04-04 MED ORDER — HYDROMORPHONE HCL 1 MG/ML IJ SOLN
1.0000 mg | INTRAMUSCULAR | Status: DC
  Administered 2024-04-04 – 2024-04-09 (×36): 1 mg via INTRAVENOUS
  Filled 2024-04-04 (×36): qty 1

## 2024-04-04 NOTE — Plan of Care (Signed)
  Problem: Education: Goal: Ability to describe self-care measures that may prevent or decrease complications (Diabetes Survival Skills Education) will improve Outcome: Progressing Goal: Individualized Educational Video(s) Outcome: Progressing   Problem: Coping: Goal: Ability to adjust to condition or change in health will improve Outcome: Progressing   Problem: Fluid Volume: Goal: Ability to maintain a balanced intake and output will improve Outcome: Progressing   Problem: Health Behavior/Discharge Planning: Goal: Ability to identify and utilize available resources and services will improve Outcome: Progressing Goal: Ability to manage health-related needs will improve Outcome: Progressing   Problem: Metabolic: Goal: Ability to maintain appropriate glucose levels will improve Outcome: Progressing   Problem: Nutritional: Goal: Maintenance of adequate nutrition will improve Outcome: Progressing Goal: Progress toward achieving an optimal weight will improve Outcome: Progressing   Problem: Skin Integrity: Goal: Risk for impaired skin integrity will decrease Outcome: Progressing   Problem: Tissue Perfusion: Goal: Adequacy of tissue perfusion will improve Outcome: Progressing   Problem: Education: Goal: Knowledge of General Education information will improve Description: Including pain rating scale, medication(s)/side effects and non-pharmacologic comfort measures Outcome: Progressing   Problem: Health Behavior/Discharge Planning: Goal: Ability to manage health-related needs will improve Outcome: Progressing   Problem: Clinical Measurements: Goal: Ability to maintain clinical measurements within normal limits will improve Outcome: Progressing Goal: Will remain free from infection Outcome: Progressing Goal: Diagnostic test results will improve Outcome: Progressing Goal: Respiratory complications will improve Outcome: Progressing Goal: Cardiovascular complication will  be avoided Outcome: Progressing   Problem: Activity: Goal: Risk for activity intolerance will decrease Outcome: Progressing   Problem: Nutrition: Goal: Adequate nutrition will be maintained Outcome: Progressing   Problem: Coping: Goal: Level of anxiety will decrease Outcome: Progressing   Problem: Elimination: Goal: Will not experience complications related to bowel motility Outcome: Progressing Goal: Will not experience complications related to urinary retention Outcome: Progressing   Problem: Pain Managment: Goal: General experience of comfort will improve and/or be controlled Outcome: Progressing   Problem: Safety: Goal: Ability to remain free from injury will improve Outcome: Progressing   Problem: Skin Integrity: Goal: Risk for impaired skin integrity will decrease Outcome: Not Progressing

## 2024-04-04 NOTE — Progress Notes (Signed)
 PROGRESS NOTE    Bob Ewing  FMW:[id number] DOB: 29-Apr-1971 DOA: 04/02/2024 PCP: [name], MD   Brief Narrative: 53 y.o. 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 Gatha being admitted to the hospital with severe weakness, dehydration, hyponatremia and failure to thrive.  Patient tells me that he knows he is dying of cancer, and is not going to make it.  He has had progressive weakness, he is hardly able to get himself out of bed, feels extremely dehydrated.  He does not report any significant pain, he has been on long-acting and short acting narcotics, followed by palliative care as an outpatient as well.  On evaluation in the emergency department, patient was noted to have continued hyponatremia, he looks incredibly pale and weak but surprisingly his hemoglobin is 8.9.  In the emergency department, ER provider discussed with his oncologist Dr. Gatha, who agrees with hospital admission, goals of care discussion, and will follow the patient in the hospital.    Assessment & Plan:   Principal Problem:   Hyponatremia  #1 hyponatremia-likely multi factorial,  HCTZ prior to admission, hypovolemic hyponatremia.  Patient was admitted on normal saline, salt tablets with improvement in sodium.   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 Sherrod.  Palliative care and oncology following.  TOC consult for residential hospice placement.  He is DNR/DNI.  Change Dilaudid  to Q2 as needed.  May need to escalate up depending on the need.   #3 type 2 diabetes-blood sugars have been running low will change his diet to regular diet hold metformin insulin  excetra. CBG (last 3)  Recent Labs    04/04/24 0525 04/04/24 0713 04/04/24 1142  GLUCAP 118* 131* 213*   #4  hypothyroidism restart Synthroid   #5 hypertension he is on multiple antihypertensives prior to admission which is all on hold.  HCTZ, metoprolol, Norvasc .  #6 hypercalcemia likely from secondary to malignancy continue IV fluids  Estimated body mass index is 27.25 kg/m as calculated from the following:   Height as of this encounter: 5' 7 (1.702 m).   Weight as of this encounter: 78.9 kg.  DVT prophylaxis: Lovenox   code Status: Full code  Family Communication: None  disposition Plan:  Status is: Inpatient Remains inpatient appropriate because: Acute illness   Consultants:  Oncology  Procedures: None Antimicrobials: None Subjective: He was more awake than yesterday he was oriented to hospital no overnight acute events foley placed due to urinary retention last evening with draining tea colored urine   Objective: Vitals:   04/03/24 1355 04/03/24 2109 04/04/24 0442 04/04/24 1144  BP: (!) 92/52 (!) 94/51 102/62 (!) 94/53  Pulse: 97  98 94  Resp: 20 18 19 16   Temp: 97.7 F (36.5 C) 98.5 F (36.9 C) 98.5 F (36.9 C) 98.3 F (36.8 C)  TempSrc:    Oral  SpO2: 100% 98% 99% 95%  Weight:      Height:        Intake/Output Summary (Last 24 hours) at 04/04/2024 1352 Last data filed at 04/04/2024 0532 Gross per 24 hour  Intake 2087.05 ml  Output 850 ml  Net 1237.05 ml   Filed Weights   04/02/24 0639  Weight: 78.9 kg    Examination:  General exam: Appears in distress Respiratory system: Clear to auscultation. Respiratory effort normal.  Cardiovascular system: S1 & S2 heard, RRR. No JVD, murmurs, rubs, gallops or clicks. No pedal edema. Gastrointestinal system: Left abdominal tenderness left flank tenderness Central nervous system:  drowsy.  Follows simple commands and answer simple questions Extremities: Edema 1+   Data Reviewed: I have personally reviewed following labs and imaging studies  CBC: Recent Labs  Lab 04/02/24 0643 04/03/24 0500  WBC 7.9 9.1  HGB 8.9*  7.8*  HCT 27.9* 24.9*  MCV 90.9 96.1  PLT 284 207   Basic Metabolic Panel: Recent Labs  Lab 04/02/24 0643 04/03/24 0500  NA 125* 128*  K 4.4 4.1  CL 85* 91*  CO2 20* 18*  GLUCOSE 88 78  BUN 14 9  CREATININE 0.60* 0.44*  CALCIUM 11.7* 10.5*   GFR: Estimated Creatinine Clearance: 101 mL/min (A) (by C-G formula based on SCr of 0.44 mg/dL (L)). Liver Function Tests: Recent Labs  Lab 04/02/24 0643  AST 37  ALT 25  ALKPHOS 138*  BILITOT 0.6  PROT 7.4  ALBUMIN  3.5   No results for input(s): LIPASE, AMYLASE in the last 168 hours. Recent Labs  Lab 04/02/24 0739  AMMONIA <13   Coagulation Profile: No results for input(s): INR, PROTIME in the last 168 hours. Cardiac Enzymes: No results for input(s): CKTOTAL, CKMB, CKMBINDEX, TROPONINI in the last 168 hours. BNP (last 3 results) Recent Labs    04/02/24 0706  PROBNP 311.0*   HbA1C: Recent Labs    04/02/24 1534  HGBA1C 5.1   CBG: Recent Labs  Lab 04/03/24 1645 04/03/24 2121 04/04/24 0525 04/04/24 0713 04/04/24 1142  GLUCAP 121* 121* 118* 131* 213*   Lipid Profile: No results for input(s): CHOL, HDL, LDLCALC, TRIG, CHOLHDL, LDLDIRECT in the last 72 hours. Thyroid  Function Tests: No results for input(s): TSH, T4TOTAL, FREET4, T3FREE, THYROIDAB in the last 72 hours. Anemia Panel: No results for input(s): VITAMINB12, FOLATE, FERRITIN, TIBC, IRON, RETICCTPCT in the last 72 hours. Sepsis Labs: No results for input(s): PROCALCITON, LATICACIDVEN in the last 168 hours.  No results found for this or any previous visit (from the past 240 hours).       Radiology Studies: No results found.  Scheduled Meds:  Chlorhexidine  Gluconate Cloth  6 each Topical Daily   enoxaparin  (LOVENOX ) injection  40 mg Subcutaneous Q24H   feeding supplement  237 mL Oral TID BM   Gerhardt's butt cream   Topical TID   insulin  aspart  0-15 Units Subcutaneous TID WC   insulin   aspart  0-5 Units Subcutaneous QHS   levothyroxine   150 mcg Oral QAC breakfast   nystatin   5 mL Oral QID   nystatin  cream   Topical BID   oxyCODONE   15 mg Oral Q12H   sodium chloride   1 g Oral TID WC   Continuous Infusions:  sodium chloride  75 mL/hr at 04/03/24 1719     LOS: 2 days    Bob KANDICE Hoots, MD  04/04/2024, 1:52 PM

## 2024-04-04 NOTE — TOC Initial Note (Signed)
 Transition of Care [facility]) - Initial/Assessment Note    Patient Details  Name: Bob Ewing MRN: [id number] Date of Birth: 1971-02-01  [facility]) CM/SW Contact:    [name], LCSW Phone Number: 04/04/2024, 3:14 PM  Clinical Narrative:                  CSW received a consult for residential hospice. CSW sent a referral to [place], hospital liaison with [facility], for review regarding possible placement at [facility]. IP care management to follow.  Expected Discharge Plan: Long Term Nursing Home Barriers to Discharge: Continued Medical Work up   Patient Goals and CMS Choice            Expected Discharge Plan and Services                                              Prior Living Arrangements/Services                       Activities of Daily Living   ADL Screening (condition at time of admission) Independently performs ADLs?: Yes (appropriate for developmental age) Is the patient deaf or have difficulty hearing?: No Does the patient have difficulty seeing, even when wearing glasses/contacts?: No Does the patient have difficulty concentrating, remembering, or making decisions?: No  Permission Sought/Granted                  Emotional Assessment              Admission diagnosis:  Hyponatremia [E87.1] Patient Active Problem List   Diagnosis Date Noted   Hyponatremia 04/02/2024   Hyperlipidemia 04/02/2021   Ischemic stroke (HCC)    Large pleural effusion 10/14/2020   Hypertension    Pleural effusion    Hypercalcemia 10/02/2020   Primary malignant neoplasm of left kidney with metastasis from kidney to other site [facility]) 09/21/2020   Encounter for antineoplastic immunotherapy 09/21/2020   PCP:  [name], MD Pharmacy:   [id number] - [place], [place] - [address] AT [facility] OF [address] [place] [address] Phone: [phone] Fax:  [phone]  [facility] [address] [place] [address] Phone: [phone] Fax: [phone]     Social Drivers of Health (SDOH) Social History: SDOH Screenings   Food Insecurity: No Food Insecurity (04/02/2024)  Housing: Low Risk  (04/02/2024)  Transportation Needs: No Transportation Needs (04/02/2024)  Utilities: Not At Risk (04/02/2024)  Depression (PHQ2-9): Low Risk  (03/14/2024)  Tobacco Use: High Risk (04/02/2024)   SDOH Interventions:     Readmission Risk Interventions     No data to display

## 2024-04-04 NOTE — Progress Notes (Signed)
 Daily Progress Note   Patient Name: Bob Ewing       Date: 04/04/2024 DOB: 1971-01-26  Age: 53 y.o. MRN#: [id number] Attending Physician: Will Almarie MATSU, MD Primary Care Physician: [name], MD Admit Date: 04/02/2024  Reason for Consultation/Follow-up: Establishing goals of care  Subjective:  Restless and in pain, cries out loud, recalls meeting Dr Sherrod yesterday evening. In Spiritual anguish, states that he needs a spiritual hand so as to fly out of this world.   Length of Stay: 2  Current Medications: Scheduled Meds:   Chlorhexidine  Gluconate Cloth  6 each Topical Daily   enoxaparin  (LOVENOX ) injection  40 mg Subcutaneous Q24H   feeding supplement  237 mL Oral TID BM   Gerhardt's butt cream   Topical TID   insulin  aspart  0-15 Units Subcutaneous TID WC   insulin  aspart  0-5 Units Subcutaneous QHS   levothyroxine   150 mcg Oral QAC breakfast   nystatin   5 mL Oral QID   nystatin  cream   Topical BID   oxyCODONE   15 mg Oral Q12H   sodium chloride   1 g Oral TID WC    Continuous Infusions:  sodium chloride  75 mL/hr at 04/03/24 1719    PRN Meds: acetaminophen  **OR** acetaminophen , albuterol , HYDROmorphone  (DILAUDID ) injection, ondansetron  **OR** ondansetron  (ZOFRAN ) IV, oxyCODONE   Physical Exam         Appears chronically ill Resting in bed Regular work of breathing No edema Appears pale  Vital Signs: BP 102/62 (BP Location: Left Arm)   Pulse 98   Temp 98.5 F (36.9 C)   Resp 19   Ht 5' 7 (1.702 m)   Wt 78.9 kg   SpO2 99%   BMI 27.25 kg/m  SpO2: SpO2: 99 % O2 Device: O2 Device: Room Air O2 Flow Rate:    Intake/output summary:  Intake/Output Summary (Last 24 hours) at 04/04/2024 1011 Last data filed at 04/04/2024 0532 Gross per 24 hour  Intake 2087.05  ml  Output 850 ml  Net 1237.05 ml   LBM: Last BM Date :  (PTA) Baseline Weight: Weight: 78.9 kg Most recent weight: Weight: 78.9 kg       Palliative Assessment/Data:      Patient Active Problem List   Diagnosis Date Noted   Hyponatremia 04/02/2024   Hyperlipidemia 04/02/2021   Ischemic  stroke [facility])    Large pleural effusion 10/14/2020   Hypertension    Pleural effusion    Hypercalcemia 10/02/2020   Primary malignant neoplasm of left kidney with metastasis from kidney to other site [facility]) 09/21/2020   Encounter for antineoplastic immunotherapy 09/21/2020    Palliative Care Assessment & Plan   Patient Profile:  53 year old gentleman, patient of Dr. 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 was receiving chemotherapy Admitted to hospital medicine service for severe weakness dehydration hyponatremia and failure to thrive Patient sees palliative care at the cancer center and is on long-acting as well as short acting opioids Patient reportedly had been saying at the time of admission that he knows he is dying of cancer and that he is not going to make it Palliative consult for CODE STATUS about goals of care discussions has been requested  Assessment:  Functional decline Cancer related cachexia  Recommendations/Plan: DNR DNI Recommend comfort measures IV Dilaudid  PRN Discussed with Dr Sherrod, agree with recommendation for residential hospice, will consult [facility] for [facility] evaluation.   Goals of Care and Additional Recommendations: Limitations on Scope of Treatment: comfort care.   Code Status: DNR DNI as of 04-04-24.     Code Status Orders  (From admission, onward)           Start     Ordered   04/02/24 1221  Full code  Continuous       Question:  By:  Answer:  Consent: discussion documented in EHR   04/02/24 1221           Code Status History     Date  Active Date Inactive Code Status Order ID Comments User Context   10/14/2020 0706 10/15/2020 2055 Full Code [id number]  Charlton Evalene RAMAN, MD ED       Prognosis:  Hours to days.   Discharge Planning: Residential hospice.   Care plan was discussed with  patient and Dr Sherrod.     Thank you for allowing the Palliative Medicine Team to assist in the care of this patient.  High MDM     Greater than 50%  of this time was spent counseling and coordinating care related to the above assessment and plan.  Lonia Serve, MD  Please contact Palliative Medicine Team phone at [phone] for questions and concerns.

## 2024-04-05 DIAGNOSIS — R52 Pain, unspecified: Secondary | ICD-10-CM

## 2024-04-05 DIAGNOSIS — R531 Weakness: Secondary | ICD-10-CM | POA: Diagnosis not present

## 2024-04-05 DIAGNOSIS — E871 Hypo-osmolality and hyponatremia: Secondary | ICD-10-CM | POA: Diagnosis not present

## 2024-04-05 DIAGNOSIS — R627 Adult failure to thrive: Secondary | ICD-10-CM | POA: Diagnosis not present

## 2024-04-05 LAB — GLUCOSE, CAPILLARY
Glucose-Capillary: 117 mg/dL — ABNORMAL HIGH (ref 70–99)
Glucose-Capillary: 106 mg/dL — ABNORMAL HIGH (ref 70–99)
Glucose-Capillary: 115 mg/dL — ABNORMAL HIGH (ref 70–99)
Glucose-Capillary: 127 mg/dL — ABNORMAL HIGH (ref 70–99)

## 2024-04-05 MED ORDER — ORAL CARE MOUTH RINSE
15.0000 mL | OROMUCOSAL | Status: DC | PRN
Start: 2024-04-05 — End: 2024-04-11

## 2024-04-05 NOTE — Progress Notes (Signed)
 [facility] Liaison Note  Received request from [facility] manager, Tawni for patient interest in [facility]. Eligibility confirmed.   Patient's caregiver/friend, Hildegard is not available until Monday as he is out of town currently. He would like to visit the patient in person when he returns to town on Monday.   Liaisons will follow up with patient and caregiver on Monday. TOC is aware.  Thank you for allowing us  to participate in this patient's care.  Eleanor Nail, LPN [facility] Liaison [phone]

## 2024-04-05 NOTE — Plan of Care (Signed)
  Problem: Education: Goal: Ability to describe self-care measures that may prevent or decrease complications (Diabetes Survival Skills Education) will improve Outcome: Progressing Goal: Individualized Educational Video(s) Outcome: Progressing   Problem: Coping: Goal: Ability to adjust to condition or change in health will improve Outcome: Progressing   Problem: Fluid Volume: Goal: Ability to maintain a balanced intake and output will improve Outcome: Progressing   Problem: Health Behavior/Discharge Planning: Goal: Ability to identify and utilize available resources and services will improve Outcome: Progressing Goal: Ability to manage health-related needs will improve Outcome: Progressing   Problem: Metabolic: Goal: Ability to maintain appropriate glucose levels will improve Outcome: Progressing   Problem: Nutritional: Goal: Maintenance of adequate nutrition will improve Outcome: Progressing Goal: Progress toward achieving an optimal weight will improve Outcome: Progressing   Problem: Skin Integrity: Goal: Risk for impaired skin integrity will decrease Outcome: Progressing   Problem: Tissue Perfusion: Goal: Adequacy of tissue perfusion will improve Outcome: Progressing   Problem: Education: Goal: Knowledge of General Education information will improve Description: Including pain rating scale, medication(s)/side effects and non-pharmacologic comfort measures Outcome: Progressing   Problem: Health Behavior/Discharge Planning: Goal: Ability to manage health-related needs will improve Outcome: Progressing   Problem: Clinical Measurements: Goal: Ability to maintain clinical measurements within normal limits will improve Outcome: Progressing Goal: Will remain free from infection Outcome: Progressing Goal: Diagnostic test results will improve Outcome: Progressing Goal: Respiratory complications will improve Outcome: Progressing Goal: Cardiovascular complication will  be avoided Outcome: Progressing   Problem: Activity: Goal: Risk for activity intolerance will decrease Outcome: Progressing   Problem: Nutrition: Goal: Adequate nutrition will be maintained Outcome: Progressing   Problem: Elimination: Goal: Will not experience complications related to bowel motility Outcome: Progressing Goal: Will not experience complications related to urinary retention Outcome: Progressing   Problem: Pain Managment: Goal: General experience of comfort will improve and/or be controlled Outcome: Progressing   Problem: Safety: Goal: Ability to remain free from injury will improve Outcome: Progressing   Problem: Skin Integrity: Goal: Risk for impaired skin integrity will decrease Outcome: Progressing   Problem: Coping: Goal: Level of anxiety will decrease Outcome: Not Progressing  Pt needs support and someone to talk to.

## 2024-04-05 NOTE — Progress Notes (Signed)
 Daily Progress Note   Patient Name: Bob Ewing       Date: 04/05/2024 DOB: 04-10-1971  Age: 53 y.o. MRN#: [id number] Attending Physician: Rosario Leatrice FERNS, MD Primary Care Physician: [name], MD Admit Date: 04/02/2024  Reason for Consultation/Follow-up: Establishing goals of care  Subjective:  Asleep but still with mild to moderate gestures of distress or discomfort.  Required 10 mg IV Dilaudid  in the past 24 hours   Length of Stay: 3  Current Medications: Scheduled Meds:   Chlorhexidine  Gluconate Cloth  6 each Topical Daily   enoxaparin  (LOVENOX ) injection  40 mg Subcutaneous Q24H   feeding supplement  237 mL Oral TID BM   Gerhardt's butt cream   Topical TID    HYDROmorphone  (DILAUDID ) injection  1 mg Intravenous Q2H   insulin  aspart  0-15 Units Subcutaneous TID WC   insulin  aspart  0-5 Units Subcutaneous QHS   levothyroxine   150 mcg Oral QAC breakfast   nystatin   5 mL Oral QID   nystatin  cream   Topical BID   oxyCODONE   15 mg Oral Q12H   sodium chloride   1 g Oral TID WC    Continuous Infusions:  sodium chloride  75 mL/hr at 04/05/24 0645    PRN Meds: acetaminophen  **OR** acetaminophen , albuterol , ondansetron  **OR** ondansetron  (ZOFRAN ) IV, mouth rinse, oxyCODONE   Physical Exam         Appears chronically ill Resting in bed Regular work of breathing No edema Appears pale  Vital Signs: BP (!) 89/52 (BP Location: Right Arm)   Pulse 100   Temp 98.4 F (36.9 C) (Oral)   Resp 20   Ht 5' 7 (1.702 m)   Wt 78.9 kg   SpO2 95%   BMI 27.25 kg/m  SpO2: SpO2: 95 % O2 Device: O2 Device: Room Air O2 Flow Rate:    Intake/output summary:  Intake/Output Summary (Last 24 hours) at 04/05/2024 1119 Last data filed at 04/05/2024 0441 Gross per 24 hour  Intake 2126.25 ml   Output 150 ml  Net 1976.25 ml   LBM: Last BM Date : 04/04/24 Baseline Weight: Weight: 78.9 kg Most recent weight: Weight: 78.9 kg       Palliative Assessment/Data:      Patient Active Problem List   Diagnosis Date Noted   Hyponatremia 04/02/2024   Hyperlipidemia 04/02/2021  Ischemic stroke (HCC)    Large pleural effusion 10/14/2020   Hypertension    Pleural effusion    Hypercalcemia 10/02/2020   Primary malignant neoplasm of left kidney with metastasis from kidney to other site [facility]) 09/21/2020   Encounter for antineoplastic immunotherapy 09/21/2020    Palliative Care Assessment & Plan   Patient Profile:  53 year old gentleman, patient of Dr. 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 was receiving chemotherapy Admitted to hospital medicine service for severe weakness dehydration hyponatremia and failure to thrive Patient sees palliative care at the cancer center and is on long-acting as well as short acting opioids Patient reportedly had been saying at the time of admission that he knows he is dying of cancer and that he is not going to make it Palliative consult for CODE STATUS about goals of care discussions has been requested  Assessment:  Functional decline Cancer related cachexia  Recommendations/Plan: DNR DNI Recommend comfort measures IV Dilaudid  PRN Discussed with Dr Sherrod on 9-4, agree with recommendation for residential hospice, appreciate [facility]  assistance for [facility] evaluation.   Goals of Care and Additional Recommendations: Limitations on Scope of Treatment: comfort care.   Code Status: DNR DNI as of 04-04-24.     Code Status Orders  (From admission, onward)           Start     Ordered   04/02/24 1221  Full code  Continuous       Question:  By:  Answer:  Consent: discussion documented in EHR   04/02/24 1221           Code Status  History     Date Active Date Inactive Code Status Order ID Comments User Context   10/14/2020 0706 10/15/2020 2055 Full Code [id number]  Charlton Evalene RAMAN, MD ED       Prognosis:  Hours to days.   Discharge Planning: Residential hospice.   Care plan was discussed with  patient   IDT    Thank you for allowing the Palliative Medicine Team to assist in the care of this patient.  low MDM     Greater than 50%  of this time was spent counseling and coordinating care related to the above assessment and plan.  Lonia Serve, MD  Please contact Palliative Medicine Team phone at [phone] for questions and concerns.

## 2024-04-05 NOTE — Plan of Care (Incomplete)
  Problem: Metabolic: Goal: Ability to maintain appropriate glucose levels will improve Outcome: Progressing   Problem: Skin Integrity: Goal: Risk for impaired skin integrity will decrease Outcome: Progressing   Problem: Tissue Perfusion: Goal: Adequacy of tissue perfusion will improve Outcome: Progressing   Problem: Coping: Goal: Level of anxiety will decrease Outcome: Progressing   Problem: Elimination: Goal: Will not experience complications related to bowel motility Outcome: Progressing Goal: Will not experience complications related to urinary retention Outcome: Progressing   Problem: Safety: Goal: Ability to remain free from injury will improve Outcome: Progressing   Problem: Skin Integrity: Goal: Risk for impaired skin integrity will decrease Outcome: Progressing   Problem: Education: Goal: Ability to describe self-care measures that may prevent or decrease complications (Diabetes Survival Skills Education) will improve Outcome: Not Progressing   Problem: Coping: Goal: Ability to adjust to condition or change in health will improve Outcome: Not Progressing   Problem: Fluid Volume: Goal: Ability to maintain a balanced intake and output will improve Outcome: Not Progressing   Problem: Health Behavior/Discharge Planning: Goal: Ability to manage health-related needs will improve Outcome: Not Progressing   Problem: Activity: Goal: Risk for activity intolerance will decrease Outcome: Not Progressing   Problem: Nutrition: Goal: Adequate nutrition will be maintained Outcome: Not Progressing   Problem: Pain Managment: Goal: General experience of comfort will improve and/or be controlled Outcome: Not Progressing

## 2024-04-05 NOTE — Progress Notes (Signed)
 Chaplains received a spiritual care consult to assist Bob Ewing with connecting to Muslim community for spiritual support.  I was unable to ask the night shift nurse who placed consult what his request was for specifically. He was resting at the time of my visit so I did not get to speak to him.  I spoke with palliative care physician as well as his current nurse who said that he had been somewhat disoriented and that conversation was difficult.  According to his nurse, there is no family locally. They are looking into transferring him to hospice.  If there are needs we can meet before that transfer occurs, please page us  at [phone].

## 2024-04-05 NOTE — TOC Progression Note (Signed)
 Transition of Care [facility]) - Progression Note    Patient Details  Name: Bob Ewing MRN: [id number] Date of Birth: 08/09/1970  Transition of Care [facility]) CM/SW Contact  [name], LCSW Phone Number: 04/05/2024, 4:02 PM  Clinical Narrative:     CSW attempted to speak with the patient's friends listed in the chart. CSW attempted to contact Corean Clause no answer. A HIPAA-compliant voicemail was left.  CSW also attempted to contact Aida Grippe no answer. A HIPAA-compliant voicemail was left requesting a return call.  CSW spoke with Hildegard Rebel, who reported he is a friend of the patient and works as a [employer]. He stated that the patient has no family and that he is currently out of state; it will take him approximately three days to visit the patient. He plans to see the patient on Monday when he returns to [place]  for a delivery drop-off. He also stated that he is not familiar with any of the other contacts listed in the patient's chart. IP care management to follow.   Expected Discharge Plan: Hospice Medical Facility Barriers to Discharge: Continued Medical Work up               Expected Discharge Plan and Services                                               Social Drivers of Health (SDOH) Interventions SDOH Screenings   Food Insecurity: No Food Insecurity (04/02/2024)  Housing: Low Risk  (04/02/2024)  Transportation Needs: No Transportation Needs (04/02/2024)  Utilities: Not At Risk (04/02/2024)  Depression (PHQ2-9): Low Risk  (03/14/2024)  Tobacco Use: High Risk (04/02/2024)    Readmission Risk Interventions     No data to display

## 2024-04-05 NOTE — Progress Notes (Signed)
 PROGRESS NOTE    Bob Ewing  FMW:[id number] DOB: March 28, 1971 DOA: 04/02/2024 PCP: [name], MD   Brief Narrative: As per prior documentation: 53 y.o. 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 Gatha being admitted to the hospital with severe weakness, dehydration, hyponatremia and failure to thrive.  Patient tells me that he knows he is dying of cancer, and is not going to make it.  He has had progressive weakness, he is hardly able to get himself out of bed, feels extremely dehydrated.  He does not report any significant pain, he has been on long-acting and short acting narcotics, followed by palliative care as an outpatient as well.  On evaluation in the emergency department, patient was noted to have continued hyponatremia, he looks incredibly pale and weak but surprisingly his hemoglobin is 8.9.  In the emergency department, ER provider discussed with his oncologist Dr. Gatha, who agrees with hospital admission, goals of care discussion, and will follow the patient in the hospital.  04/05/2024: No new complaints pain is controlled.  Input from the palliative care team is appreciated.       Assessment & Plan:   Principal Problem:   Hyponatremia  #1 hyponatremia-likely multi factorial,  HCTZ prior to admission, hypovolemic hyponatremia.  Patient was admitted on normal saline, salt tablets with improvement in sodium. 04/05/2024: Likely related to malignancy.  Hospice is following patient.  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 Sherrod.  Palliative care and oncology following.  TOC consult for residential hospice placement.  He is DNR/DNI.  Change Dilaudid  to Q2 as needed.  May need to escalate up depending on the need. 04/05/2024: Input from palliative  care team is directed.  Comfort directed measures recommended.   #3 type 2 diabetes-blood sugars have been running low will change his diet to regular diet hold metformin insulin  excetra. CBG (last 3)  Recent Labs    04/05/24 0715 04/05/24 1228 04/05/24 1626  GLUCAP 117* 106* 115*  04/05/2024: Goal of care has been addressed.  #4 hypothyroidism restart Synthroid   #5 hypertension he is on multiple antihypertensives prior to admission which is all on hold.  HCTZ, metoprolol, Norvasc .  #6 hypercalcemia likely from secondary to malignancy continue IV fluids 04/05/2024: Goal of care has been addressed.  Estimated body mass index is 27.25 kg/m as calculated from the following:   Height as of this encounter: 5' 7 (1.702 m).   Weight as of this encounter: 78.9 kg.  DVT prophylaxis: Lovenox   code Status: Full code  Family Communication: None  disposition Plan:  Status is: Inpatient Remains inpatient appropriate because: Acute illness   Consultants:  Oncology  Procedures: None Antimicrobials: None Subjective: It is controlled.  Objective: Vitals:   04/04/24 0442 04/04/24 1144 04/04/24 2123 04/05/24 1229  BP: 102/62 (!) 94/53 (!) 89/52 (!) 93/53  Pulse: 98 94 100 96  Resp: 19 16 20 18   Temp: 98.5 F (36.9 C) 98.3 F (36.8 C) 98.4 F (36.9 C) 98 F (36.7 C)  TempSrc:  Oral Oral Oral  SpO2: 99% 95% 95% 100%  Weight:      Height:        Intake/Output Summary (Last 24 hours) at 04/05/2024 1936 Last data filed at 04/05/2024 1729 Gross per 24 hour  Intake 1411.81 ml  Output --  Net 1411.81 ml   Filed Weights   04/02/24 0639  Weight: 78.9 kg    Examination:  General exam: Not in any distress.   Respiratory system: Clear to auscultation.  Cardiovascular system: S1 & S2 heard Central nervous system: Awake and alert. Extremities: Edema of the lower extremity   Data Reviewed: I have personally reviewed following labs and imaging studies  CBC: Recent Labs  Lab  04/02/24 0643 04/03/24 0500  WBC 7.9 9.1  HGB 8.9* 7.8*  HCT 27.9* 24.9*  MCV 90.9 96.1  PLT 284 207   Basic Metabolic Panel: Recent Labs  Lab 04/02/24 0643 04/03/24 0500  NA 125* 128*  K 4.4 4.1  CL 85* 91*  CO2 20* 18*  GLUCOSE 88 78  BUN 14 9  CREATININE 0.60* 0.44*  CALCIUM 11.7* 10.5*   GFR: Estimated Creatinine Clearance: 101 mL/min (A) (by C-G formula based on SCr of 0.44 mg/dL (L)). Liver Function Tests: Recent Labs  Lab 04/02/24 0643  AST 37  ALT 25  ALKPHOS 138*  BILITOT 0.6  PROT 7.4  ALBUMIN  3.5   No results for input(s): LIPASE, AMYLASE in the last 168 hours. Recent Labs  Lab 04/02/24 0739  AMMONIA <13   Coagulation Profile: No results for input(s): INR, PROTIME in the last 168 hours. Cardiac Enzymes: No results for input(s): CKTOTAL, CKMB, CKMBINDEX, TROPONINI in the last 168 hours. BNP (last 3 results) Recent Labs    04/02/24 0706  PROBNP 311.0*   HbA1C: No results for input(s): HGBA1C in the last 72 hours.  CBG: Recent Labs  Lab 04/04/24 1616 04/04/24 2308 04/05/24 0715 04/05/24 1228 04/05/24 1626  GLUCAP 178* 112* 117* 106* 115*   Lipid Profile: No results for input(s): CHOL, HDL, LDLCALC, TRIG, CHOLHDL, LDLDIRECT in the last 72 hours. Thyroid  Function Tests: No results for input(s): TSH, T4TOTAL, FREET4, T3FREE, THYROIDAB in the last 72 hours. Anemia Panel: No results for input(s): VITAMINB12, FOLATE, FERRITIN, TIBC, IRON, RETICCTPCT in the last 72 hours. Sepsis Labs: No results for input(s): PROCALCITON, LATICACIDVEN in the last 168 hours.  No results found for this or any previous visit (from the past 240 hours).       Radiology Studies: No results found.  Scheduled Meds:  Chlorhexidine  Gluconate Cloth  6 each Topical Daily   enoxaparin  (LOVENOX ) injection  40 mg Subcutaneous Q24H   feeding supplement  237 mL Oral TID BM   Gerhardt's butt cream   Topical  TID    HYDROmorphone  (DILAUDID ) injection  1 mg Intravenous Q2H   insulin  aspart  0-15 Units Subcutaneous TID WC   insulin  aspart  0-5 Units Subcutaneous QHS   levothyroxine   150 mcg Oral QAC breakfast   nystatin   5 mL Oral QID   nystatin  cream   Topical BID   oxyCODONE   15 mg Oral Q12H   sodium chloride   1 g Oral TID WC   Continuous Infusions:  sodium chloride  75 mL/hr at 04/05/24 0645     LOS: 3 days    Leatrice LILLETTE Chapel, MD  04/05/2024, 7:36 PM

## 2024-04-06 DIAGNOSIS — R531 Weakness: Secondary | ICD-10-CM | POA: Diagnosis not present

## 2024-04-06 DIAGNOSIS — R627 Adult failure to thrive: Secondary | ICD-10-CM | POA: Diagnosis not present

## 2024-04-06 DIAGNOSIS — R52 Pain, unspecified: Secondary | ICD-10-CM | POA: Diagnosis not present

## 2024-04-06 DIAGNOSIS — E871 Hypo-osmolality and hyponatremia: Secondary | ICD-10-CM | POA: Diagnosis not present

## 2024-04-06 LAB — GLUCOSE, CAPILLARY
Glucose-Capillary: 107 mg/dL — ABNORMAL HIGH (ref 70–99)
Glucose-Capillary: 137 mg/dL — ABNORMAL HIGH (ref 70–99)

## 2024-04-06 NOTE — Progress Notes (Signed)
 Daily Progress Note   Patient Name: Bob Ewing       Date: 04/06/2024 DOB: 03-28-1971  Age: 53 y.o. MRN#: [id number] Attending Physician: Rosario Leatrice FERNS, MD Primary Care Physician: [name], MD Admit Date: 04/02/2024  Reason for Consultation/Follow-up: Establishing goals of care  Subjective:  Asleep but still with mild to moderate gestures of distress or discomfort.  Required 8 mg IV Dilaudid  in the past 24 hours as well as PO PRN Oxy IR.    Length of Stay: 4  Current Medications: Scheduled Meds:   Chlorhexidine  Gluconate Cloth  6 each Topical Daily   enoxaparin  (LOVENOX ) injection  40 mg Subcutaneous Q24H   feeding supplement  237 mL Oral TID BM   Gerhardt's butt cream   Topical TID    HYDROmorphone  (DILAUDID ) injection  1 mg Intravenous Q2H   levothyroxine   150 mcg Oral QAC breakfast   nystatin   5 mL Oral QID   nystatin  cream   Topical BID   oxyCODONE   15 mg Oral Q12H   sodium chloride   1 g Oral TID WC    Continuous Infusions:  sodium chloride  75 mL/hr at 04/06/24 0800    PRN Meds: acetaminophen  **OR** acetaminophen , albuterol , ondansetron  **OR** ondansetron  (ZOFRAN ) IV, mouth rinse, oxyCODONE   Physical Exam         Appears chronically ill Resting in bed Regular work of breathing No edema Appears pale  Vital Signs: BP (!) 85/49 (BP Location: Right Arm)   Pulse (!) 102   Temp 97.8 F (36.6 C) (Axillary)   Resp 18   Ht 5' 7 (1.702 m)   Wt 78.9 kg   SpO2 93%   BMI 27.25 kg/m  SpO2: SpO2: 93 % O2 Device: O2 Device: Room Air O2 Flow Rate:    Intake/output summary:  Intake/Output Summary (Last 24 hours) at 04/06/2024 1157 Last data filed at 04/06/2024 0800 Gross per 24 hour  Intake 1957.89 ml  Output 250 ml  Net 1707.89 ml   LBM: Last BM Date :  04/04/24 Baseline Weight: Weight: 78.9 kg Most recent weight: Weight: 78.9 kg       Palliative Assessment/Data:      Patient Active Problem List   Diagnosis Date Noted   Hyponatremia 04/02/2024   Hyperlipidemia 04/02/2021   Ischemic stroke (HCC)    Large pleural  effusion 10/14/2020   Hypertension    Pleural effusion    Hypercalcemia 10/02/2020   Primary malignant neoplasm of left kidney with metastasis from kidney to other site [facility]) 09/21/2020   Encounter for antineoplastic immunotherapy 09/21/2020    Palliative Care Assessment & Plan   Patient Profile:  53 year old gentleman, patient of Dr. 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 was receiving chemotherapy Admitted to hospital medicine service for severe weakness dehydration hyponatremia and failure to thrive Patient sees palliative care at the cancer center and is on long-acting as well as short acting opioids Patient reportedly had been saying at the time of admission that he knows he is dying of cancer and that he is not going to make it Palliative consult for CODE STATUS about goals of care discussions has been requested  Assessment:  Functional decline Cancer related cachexia  Recommendations/Plan: DNR DNI Recommend comfort measures IV Dilaudid  PRN Discussed with Dr Sherrod on 9-4, agree with recommendation for residential hospice, appreciate [facility]  assistance for [facility] evaluation.  TOC note and hospice liaison note reviewed.   Goals of Care and Additional Recommendations: Limitations on Scope of Treatment: comfort care.   Code Status: DNR DNI as of 04-04-24.     Code Status Orders  (From admission, onward)           Start     Ordered   04/02/24 1221  Full code  Continuous       Question:  By:  Answer:  Consent: discussion documented in EHR   04/02/24 1221           Code Status History      Date Active Date Inactive Code Status Order ID Comments User Context   10/14/2020 0706 10/15/2020 2055 Full Code [id number]  Charlton Evalene RAMAN, MD ED       Prognosis:  Hours to days.   Discharge Planning: Residential hospice.   Care plan was discussed with  patient   IDT    Thank you for allowing the Palliative Medicine Team to assist in the care of this patient.  low MDM     Greater than 50%  of this time was spent counseling and coordinating care related to the above assessment and plan.  Lonia Serve, MD  Please contact Palliative Medicine Team phone at [phone] for questions and concerns.

## 2024-04-06 NOTE — Progress Notes (Signed)
 PROGRESS NOTE    Bob Ewing  FMW:[id number] DOB: [birth year] DOA: 04/02/2024 PCP: [name], MD   Brief Narrative: As per prior documentation: 53 y.o. 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 Gatha being admitted to the hospital with severe weakness, dehydration, hyponatremia and failure to thrive.  Patient tells me that he knows he is dying of cancer, and is not going to make it.  He has had progressive weakness, he is hardly able to get himself out of bed, feels extremely dehydrated.  He does not report any significant pain, he has been on long-acting and short acting narcotics, followed by palliative care as an outpatient as well.  On evaluation in the emergency department, patient was noted to have continued hyponatremia, he looks incredibly pale and weak but surprisingly his hemoglobin is 8.9.  In the emergency department, ER provider discussed with his oncologist Dr. Gatha, who agrees with hospital admission, goals of care discussion, and will follow the patient in the hospital.  04/05/2024: No new complaints pain is controlled.  Input from the palliative care team is appreciated.     04/06/2024: Input from palliative care team is highly appreciated.  Goal of care is comfort directed measures.  Palliative care is directing care.  Continue to optimize pain control.    Assessment & Plan:   Principal Problem:   Hyponatremia  #1 hyponatremia-likely multi factorial,  HCTZ prior to admission, hypovolemic hyponatremia.  Patient was admitted on normal saline, salt tablets with improvement in sodium. 04/05/2024: Likely related to malignancy.  Hospice is following patient.  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 Sherrod.  Palliative care and oncology  following.  TOC consult for residential hospice placement.  He is DNR/DNI.  Change Dilaudid  to Q2 as needed.  May need to escalate up depending on the need. 04/05/2024: Input from palliative care team is directed.  Comfort directed measures recommended. 04/06/2024: Patient is now on comfort measures.   #3 type 2 diabetes-blood sugars have been running low will change his diet to regular diet hold metformin insulin  excetra. CBG (last 3)  Recent Labs    04/05/24 2223 04/06/24 0800 04/06/24 1135  GLUCAP 127* 107* 137*  04/05/2024: Goal of care has been addressed.  #4 hypothyroidism restart Synthroid   #5 hypertension he is on multiple antihypertensives prior to admission which is all on hold.  HCTZ, metoprolol, Norvasc .  #6 hypercalcemia likely from secondary to malignancy continue IV fluids 04/05/2024: Goal of care has been addressed.  Estimated body mass index is 27.25 kg/m as calculated from the following:   Height as of this encounter: 5' 7 (1.702 m).   Weight as of this encounter: 78.9 kg.  DVT prophylaxis: Lovenox   code Status: Full code  Family Communication: None  disposition Plan:  Status is: Inpatient Remains inpatient appropriate because: Acute illness   Consultants:  Oncology  Procedures: None Antimicrobials: None Subjective: It is controlled.  Objective: Vitals:   04/04/24 2123 04/05/24 1229 04/05/24 2221 04/06/24 0639  BP: (!) 89/52 (!) 93/53 (!) 90/51 (!) 85/49  Pulse: 100 96 (!) 103 (!) 102  Resp: 20 18 18    Temp: 98.4 F (36.9 C) 98 F (36.7 C) 97.9 F (36.6 C) 97.8 F (36.6 C)  TempSrc: Oral Oral Oral Axillary  SpO2: 95% 100% 99% 93%  Weight:      Height:        Intake/Output Summary (Last 24 hours) at 04/06/2024 1635 Last data filed at 04/06/2024 0800 Gross per 24 hour  Intake 1907.89 ml  Output 250 ml  Net 1657.89 ml   Filed Weights   04/02/24 0639  Weight: 78.9 kg    Examination:  General exam: Not in any distress.   Respiratory system:  Clear to auscultation.  Cardiovascular system: S1 & S2 heard Central nervous system: Awake and alert. Extremities: Edema of the lower extremity   Data Reviewed: I have personally reviewed following labs and imaging studies  CBC: Recent Labs  Lab 04/02/24 0643 04/03/24 0500  WBC 7.9 9.1  HGB 8.9* 7.8*  HCT 27.9* 24.9*  MCV 90.9 96.1  PLT 284 207   Basic Metabolic Panel: Recent Labs  Lab 04/02/24 0643 04/03/24 0500  NA 125* 128*  K 4.4 4.1  CL 85* 91*  CO2 20* 18*  GLUCOSE 88 78  BUN 14 9  CREATININE 0.60* 0.44*  CALCIUM 11.7* 10.5*   GFR: Estimated Creatinine Clearance: 101 mL/min (A) (by C-G formula based on SCr of 0.44 mg/dL (L)). Liver Function Tests: Recent Labs  Lab 04/02/24 0643  AST 37  ALT 25  ALKPHOS 138*  BILITOT 0.6  PROT 7.4  ALBUMIN  3.5   No results for input(s): LIPASE, AMYLASE in the last 168 hours. Recent Labs  Lab 04/02/24 0739  AMMONIA <13   Coagulation Profile: No results for input(s): INR, PROTIME in the last 168 hours. Cardiac Enzymes: No results for input(s): CKTOTAL, CKMB, CKMBINDEX, TROPONINI in the last 168 hours. BNP (last 3 results) Recent Labs    04/02/24 0706  PROBNP 311.0*   HbA1C: No results for input(s): HGBA1C in the last 72 hours.  CBG: Recent Labs  Lab 04/05/24 1228 04/05/24 1626 04/05/24 2223 04/06/24 0800 04/06/24 1135  GLUCAP 106* 115* 127* 107* 137*   Lipid Profile: No results for input(s): CHOL, HDL, LDLCALC, TRIG, CHOLHDL, LDLDIRECT in the last 72 hours. Thyroid  Function Tests: No results for input(s): TSH, T4TOTAL, FREET4, T3FREE, THYROIDAB in the last 72 hours. Anemia Panel: No results for input(s): VITAMINB12, FOLATE, FERRITIN, TIBC, IRON, RETICCTPCT in the last 72 hours. Sepsis Labs: No results for input(s): PROCALCITON, LATICACIDVEN in the last 168 hours.  No results found for this or any previous visit (from the past 240 hours).        Radiology Studies: No results found.  Scheduled Meds:  Chlorhexidine  Gluconate Cloth  6 each Topical Daily   enoxaparin  (LOVENOX ) injection  40 mg Subcutaneous Q24H   feeding supplement  237 mL Oral TID BM   Gerhardt's butt cream   Topical TID    HYDROmorphone  (DILAUDID ) injection  1 mg Intravenous Q2H   levothyroxine   150 mcg Oral QAC breakfast   nystatin   5 mL Oral QID   nystatin  cream   Topical BID   oxyCODONE   15 mg Oral Q12H   sodium chloride   1 g Oral TID WC   Continuous Infusions:  sodium chloride  75 mL/hr at 04/06/24 1321     LOS: 4 days    Bob LILLETTE Chapel, MD  04/06/2024, 4:35 PM

## 2024-04-07 DIAGNOSIS — Z7189 Other specified counseling: Secondary | ICD-10-CM | POA: Diagnosis not present

## 2024-04-07 DIAGNOSIS — Z515 Encounter for palliative care: Secondary | ICD-10-CM | POA: Diagnosis not present

## 2024-04-07 DIAGNOSIS — R531 Weakness: Secondary | ICD-10-CM | POA: Diagnosis not present

## 2024-04-07 DIAGNOSIS — E871 Hypo-osmolality and hyponatremia: Secondary | ICD-10-CM | POA: Diagnosis not present

## 2024-04-07 LAB — GLUCOSE, CAPILLARY: Glucose-Capillary: 134 mg/dL — ABNORMAL HIGH (ref 70–99)

## 2024-04-07 NOTE — Progress Notes (Signed)
 PROGRESS NOTE    Bob Ewing  FMW:[id number] DOB: 15-Sep-1970 DOA: 04/02/2024 PCP: [name], MD   Brief Narrative: As per prior documentation: 53 y.o. 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 Gatha being admitted to the hospital with severe weakness, dehydration, hyponatremia and failure to thrive.  Patient tells me that he knows he is dying of cancer, and is not going to make it.  He has had progressive weakness, he is hardly able to get himself out of bed, feels extremely dehydrated.  He does not report any significant pain, he has been on long-acting and short acting narcotics, followed by palliative care as an outpatient as well.  On evaluation in the emergency department, patient was noted to have continued hyponatremia, he looks incredibly pale and weak but surprisingly his hemoglobin is 8.9.  In the emergency department, ER provider discussed with his oncologist Dr. Gatha, who agrees with hospital admission, goals of care discussion, and will follow the patient in the hospital.  04/05/2024: No new complaints pain is controlled.  Input from the palliative care team is appreciated.     04/06/2024: Input from palliative care team is highly appreciated.  Goal of care is comfort directed measures.  Palliative care is directing care.  Continue to optimize pain control. 04/07/2024: Continue comfort directed care.  Palliative care team is directing.    Assessment & Plan:   Principal Problem:   Hyponatremia  #1 hyponatremia-likely multi factorial,  HCTZ prior to admission, hypovolemic hyponatremia.  Patient was admitted on normal saline, salt tablets with improvement in sodium. 04/05/2024: Likely related to malignancy.  Hospice is following patient.  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 Sherrod.  Palliative care and oncology following.  TOC consult for residential hospice placement.  He is DNR/DNI.  Change Dilaudid  to Q2 as needed.  May need to escalate up depending on the need. 04/05/2024: Input from palliative care team is directed.  Comfort directed measures recommended. 04/06/2024: Patient is now on comfort measures. 04/07/2024: Continue comfort directed care.   #3 type 2 diabetes-blood sugars have been running low will change his diet to regular diet hold metformin insulin  excetra. CBG (last 3)  Recent Labs    04/06/24 0800 04/06/24 1135 04/07/24 0005  GLUCAP 107* 137* 134*  04/05/2024: Goal of care has been addressed.  #4 hypothyroidism restart Synthroid   #5 hypertension he is on multiple antihypertensives prior to admission which is all on hold.  HCTZ, metoprolol, Norvasc .  #6 hypercalcemia likely from secondary to malignancy continue IV fluids 04/05/2024: Goal of care has been addressed.  Estimated body mass index is 27.25 kg/m as calculated from the following:   Height as of this encounter: 5' 7 (1.702 m).   Weight as of this encounter: 78.9 kg.  DVT prophylaxis: Lovenox   code Status: Full code  Family Communication: None  disposition Plan:  Status is: Inpatient Remains inpatient appropriate because: Acute illness   Consultants:  Oncology  Procedures: None Antimicrobials: None Subjective: It is controlled.  Objective: Vitals:   04/05/24 1229 04/05/24 2221 04/06/24 0639 04/06/24 2132  BP: (!) 93/53 (!) 90/51 (!) 85/49 (!) 90/58  Pulse: 96 (!) 103 (!) 102 (!) 105  Resp: 18 18  20   Temp: 98 F (36.7 C) 97.9 F (36.6 C) 97.8 F (36.6 C)  98.2 F (36.8 C)  TempSrc: Oral Oral Axillary Oral  SpO2: 100% 99% 93% (!) 81%  Weight:      Height:        Intake/Output Summary (Last 24 hours) at 04/07/2024 1930 Last data filed at 04/07/2024 1700 Gross per 24 hour  Intake 120 ml  Output 300 ml  Net -180 ml   Filed  Weights   04/02/24 0639  Weight: 78.9 kg    Examination:  General exam: Not in any distress.   Respiratory system: Clear to auscultation.  Cardiovascular system: S1 & S2 heard Central nervous system: Awake and alert. Extremities: Edema of the lower extremity   Data Reviewed: I have personally reviewed following labs and imaging studies  CBC: Recent Labs  Lab 04/02/24 0643 04/03/24 0500  WBC 7.9 9.1  HGB 8.9* 7.8*  HCT 27.9* 24.9*  MCV 90.9 96.1  PLT 284 207   Basic Metabolic Panel: Recent Labs  Lab 04/02/24 0643 04/03/24 0500  NA 125* 128*  K 4.4 4.1  CL 85* 91*  CO2 20* 18*  GLUCOSE 88 78  BUN 14 9  CREATININE 0.60* 0.44*  CALCIUM 11.7* 10.5*   GFR: Estimated Creatinine Clearance: 101 mL/min (A) (by C-G formula based on SCr of 0.44 mg/dL (L)). Liver Function Tests: Recent Labs  Lab 04/02/24 0643  AST 37  ALT 25  ALKPHOS 138*  BILITOT 0.6  PROT 7.4  ALBUMIN  3.5   No results for input(s): LIPASE, AMYLASE in the last 168 hours. Recent Labs  Lab 04/02/24 0739  AMMONIA <13   Coagulation Profile: No results for input(s): INR, PROTIME in the last 168 hours. Cardiac Enzymes: No results for input(s): CKTOTAL, CKMB, CKMBINDEX, TROPONINI in the last 168 hours. BNP (last 3 results) Recent Labs    04/02/24 0706  PROBNP 311.0*   HbA1C: No results for input(s): HGBA1C in the last 72 hours.  CBG: Recent Labs  Lab 04/05/24 1626 04/05/24 2223 04/06/24 0800 04/06/24 1135 04/07/24 0005  GLUCAP 115* 127* 107* 137* 134*   Lipid Profile: No results for input(s): CHOL, HDL, LDLCALC, TRIG, CHOLHDL, LDLDIRECT in the last 72 hours. Thyroid  Function Tests: No results for input(s): TSH, T4TOTAL, FREET4, T3FREE, THYROIDAB in the last 72 hours. Anemia Panel: No results for input(s): VITAMINB12, FOLATE, FERRITIN, TIBC, IRON, RETICCTPCT in the last 72 hours. Sepsis Labs: No results for input(s):  PROCALCITON, LATICACIDVEN in the last 168 hours.  No results found for this or any previous visit (from the past 240 hours).       Radiology Studies: No results found.  Scheduled Meds:  Chlorhexidine  Gluconate Cloth  6 each Topical Daily   enoxaparin  (LOVENOX ) injection  40 mg Subcutaneous Q24H   feeding supplement  237 mL Oral TID BM   Gerhardt's butt cream   Topical TID    HYDROmorphone  (DILAUDID ) injection  1 mg Intravenous Q2H   levothyroxine   150 mcg Oral QAC breakfast   nystatin   5 mL Oral QID   nystatin  cream   Topical BID   oxyCODONE   15 mg Oral Q12H   sodium chloride   1 g Oral TID WC   Continuous Infusions:  sodium chloride  75 mL/hr at 04/07/24 0800     LOS: 5 days    Leatrice LILLETTE Chapel, MD  04/07/2024, 7:30 PM

## 2024-04-07 NOTE — Progress Notes (Signed)
 Daily Progress Note   Patient Name: Lillian Tigges       Date: 04/07/2024 DOB: 1971/03/30  Age: 53 y.o. MRN#: [id number] Attending Physician: Rosario Leatrice FERNS, MD Primary Care Physician: [name], MD Admit Date: 04/02/2024  Reason for Consultation/Follow-up: Establishing goals of care  Subjective:  Awake but confused, asks whether he was sleeping, not aware that he is in hospital.  Medication history noted.     Length of Stay: 5  Current Medications: Scheduled Meds:   Chlorhexidine  Gluconate Cloth  6 each Topical Daily   enoxaparin  (LOVENOX ) injection  40 mg Subcutaneous Q24H   feeding supplement  237 mL Oral TID BM   Gerhardt's butt cream   Topical TID    HYDROmorphone  (DILAUDID ) injection  1 mg Intravenous Q2H   levothyroxine   150 mcg Oral QAC breakfast   nystatin   5 mL Oral QID   nystatin  cream   Topical BID   oxyCODONE   15 mg Oral Q12H   sodium chloride   1 g Oral TID WC    Continuous Infusions:  sodium chloride  Stopped (04/06/24 1659)    PRN Meds: acetaminophen  **OR** acetaminophen , albuterol , ondansetron  **OR** ondansetron  (ZOFRAN ) IV, mouth rinse, oxyCODONE   Physical Exam         Appears chronically ill Resting in bed Regular work of breathing No edema Appears pale  Vital Signs: BP (!) 90/58 (BP Location: Left Arm)   Pulse (!) 105   Temp 98.2 F (36.8 C) (Oral)   Resp 20   Ht 5' 7 (1.702 m)   Wt 78.9 kg   SpO2 (!) 81%   BMI 27.25 kg/m  SpO2: SpO2: (!) 81 % O2 Device: O2 Device: Room Air O2 Flow Rate:    Intake/output summary:  Intake/Output Summary (Last 24 hours) at 04/07/2024 1012 Last data filed at 04/07/2024 0900 Gross per 24 hour  Intake 793.34 ml  Output 100 ml  Net 693.34 ml   LBM: Last BM Date : 04/04/24 Baseline Weight: Weight: 78.9  kg Most recent weight: Weight: 78.9 kg       Palliative Assessment/Data:      Patient Active Problem List   Diagnosis Date Noted   Hyponatremia 04/02/2024   Hyperlipidemia 04/02/2021   Ischemic stroke (HCC)    Large pleural effusion 10/14/2020   Hypertension    Pleural effusion  Hypercalcemia 10/02/2020   Primary malignant neoplasm of left kidney with metastasis from kidney to other site [facility]) 09/21/2020   Encounter for antineoplastic immunotherapy 09/21/2020    Palliative Care Assessment & Plan   Patient Profile:  53 year old gentleman, patient of Dr. 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 was receiving chemotherapy Admitted to hospital medicine service for severe weakness dehydration hyponatremia and failure to thrive Patient sees palliative care at the cancer center and is on long-acting as well as short acting opioids Patient reportedly had been saying at the time of admission that he knows he is dying of cancer and that he is not going to make it Palliative consult for CODE STATUS about goals of care discussions has been requested  Assessment:  Functional decline Cancer related cachexia  Recommendations/Plan: DNR DNI Recommend ongoing comfort measures IV Dilaudid  PRN Discussed with Dr Sherrod on 9-4, agree with recommendation for residential hospice, appreciate [facility]  assistance for [facility] evaluation.  TOC note and hospice liaison note reviewed. Awaiting friends' arrival from out of town so as to be able to coordinate a residential hospice discharge, immediate family is in [place].   Goals of Care and Additional Recommendations: Limitations on Scope of Treatment: comfort care.   Code Status: DNR DNI as of 04-04-24.     Code Status Orders  (From admission, onward)           Start     Ordered   04/02/24 1221  Full code  Continuous       Question:  By:   Answer:  Consent: discussion documented in EHR   04/02/24 1221           Code Status History     Date Active Date Inactive Code Status Order ID Comments User Context   10/14/2020 0706 10/15/2020 2055 Full Code [id number]  Charlton Evalene RAMAN, MD ED       Prognosis:  Hours to days.   Discharge Planning: Residential hospice.   Care plan was discussed with  patient   IDT    Thank you for allowing the Palliative Medicine Team to assist in the care of this patient.  mod MDM     Greater than 50%  of this time was spent counseling and coordinating care related to the above assessment and plan.  Lonia Serve, MD  Please contact Palliative Medicine Team phone at [phone] for questions and concerns.

## 2024-04-07 NOTE — Plan of Care (Signed)

## 2024-04-07 NOTE — Plan of Care (Incomplete)
  Problem: Metabolic: Goal: Ability to maintain appropriate glucose levels will improve Outcome: Progressing   Problem: Skin Integrity: Goal: Risk for impaired skin integrity will decrease Outcome: Progressing   Problem: Tissue Perfusion: Goal: Adequacy of tissue perfusion will improve Outcome: Progressing   Problem: Education: Goal: Knowledge of General Education information will improve Description: Including pain rating scale, medication(s)/side effects and non-pharmacologic comfort measures Outcome: Progressing   Problem: Clinical Measurements: Goal: Will remain free from infection Outcome: Progressing Goal: Diagnostic test results will improve Outcome: Progressing   Problem: Activity: Goal: Risk for activity intolerance will decrease Outcome: Progressing   Problem: Safety: Goal: Ability to remain free from injury will improve Outcome: Progressing   Problem: Skin Integrity: Goal: Risk for impaired skin integrity will decrease Outcome: Progressing   Problem: Education: Goal: Ability to describe self-care measures that may prevent or decrease complications (Diabetes Survival Skills Education) will improve Outcome: Not Progressing   Problem: Coping: Goal: Ability to adjust to condition or change in health will improve Outcome: Not Progressing   Problem: Health Behavior/Discharge Planning: Goal: Ability to manage health-related needs will improve Outcome: Not Progressing   Problem: Nutritional: Goal: Maintenance of adequate nutrition will improve Outcome: Not Progressing   Problem: Health Behavior/Discharge Planning: Goal: Ability to manage health-related needs will improve Outcome: Not Progressing   Problem: Nutrition: Goal: Adequate nutrition will be maintained Outcome: Not Progressing   Problem: Pain Managment: Goal: General experience of comfort will improve and/or be controlled Outcome: Not Progressing

## 2024-04-08 DIAGNOSIS — Z7189 Other specified counseling: Secondary | ICD-10-CM | POA: Diagnosis not present

## 2024-04-08 DIAGNOSIS — Z515 Encounter for palliative care: Secondary | ICD-10-CM | POA: Diagnosis not present

## 2024-04-08 DIAGNOSIS — E871 Hypo-osmolality and hyponatremia: Secondary | ICD-10-CM | POA: Diagnosis not present

## 2024-04-08 DIAGNOSIS — R531 Weakness: Secondary | ICD-10-CM | POA: Diagnosis not present

## 2024-04-08 LAB — GLUCOSE, CAPILLARY: Glucose-Capillary: 146 mg/dL — ABNORMAL HIGH (ref 70–99)

## 2024-04-08 NOTE — Progress Notes (Signed)
 PROGRESS NOTE    Bob Ewing  FMW:[id number] DOB: 06/17/1971 DOA: 04/02/2024 PCP: [name], MD   Brief Narrative: As per prior documentation: 53 y.o. 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 Gatha being admitted to the hospital with severe weakness, dehydration, hyponatremia and failure to thrive.  Patient tells me that he knows he is dying of cancer, and is not going to make it.  He has had progressive weakness, he is hardly able to get himself out of bed, feels extremely dehydrated.  He does not report any significant pain, he has been on long-acting and short acting narcotics, followed by palliative care as an outpatient as well.  On evaluation in the emergency department, patient was noted to have continued hyponatremia, he looks incredibly pale and weak but surprisingly his hemoglobin is 8.9.  In the emergency department, ER provider discussed with his oncologist Dr. Gatha, who agrees with hospital admission, goals of care discussion, and will follow the patient in the hospital.    04/06/2024: Input from palliative care team is highly appreciated.  Goal of care is comfort directed measures.  Palliative care is directing care.  Continue to optimize pain control. 04/08/2024: Continue comfort directed care.  Palliative care team is directing.    Assessment & Plan:   Principal Problem:   Hyponatremia  #1 hyponatremia: - Likely multifactorial. - Patient is on comfort directed care only. - Pursue disposition.  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  #3 type 2 diabetes: - Comfort directed care.  #4 hypothyroidism: #5 hypertension:  #6 hypercalcemia: - Comfort directed care.    DVT prophylaxis: Comfort directed care code Status: Full code   Family Communication: None  Disposition Plan: Pursue disposition, likely hospice house.     Consultants:  Oncology Palliative care  Procedures: None Antimicrobials: None  Subjective: Patient is comfortable  Objective: Vitals:   04/05/24 2221 04/06/24 0639 04/06/24 2132 04/08/24 1325  BP: (!) 90/51 (!) 85/49 (!) 90/58 (!) 81/53  Pulse: (!) 103 (!) 102 (!) 105 (!) 106  Resp: 18  20 18   Temp: 97.9 F (36.6 C) 97.8 F (36.6 C) 98.2 F (36.8 C) 98.2 F (36.8 C)  TempSrc: Oral Axillary Oral   SpO2: 99% 93% (!) 81% 92%  Weight:      Height:        Intake/Output Summary (Last 24 hours) at 04/08/2024 1541 Last data filed at 04/08/2024 0951 Gross per 24 hour  Intake 120 ml  Output 300 ml  Net -180 ml   Filed Weights   04/02/24 0639  Weight: 78.9 kg    Examination:  General exam: Not in any distress.   Respiratory system: Clear to auscultation.  Cardiovascular system: S1 & S2 heard Central nervous system: Awake and alert. Extremities: Edema of the lower extremity   Data Reviewed: I have personally reviewed following labs and imaging studies  CBC: Recent Labs  Lab 04/02/24 0643 04/03/24 0500  WBC 7.9 9.1  HGB 8.9* 7.8*  HCT 27.9* 24.9*  MCV 90.9 96.1  PLT 284 207   Basic Metabolic Panel: Recent Labs  Lab 04/02/24 0643 04/03/24 0500  NA 125* 128*  K 4.4 4.1  CL 85* 91*  CO2 20* 18*  GLUCOSE 88 78  BUN 14 9  CREATININE 0.60* 0.44*  CALCIUM 11.7*  10.5*   GFR: Estimated Creatinine Clearance: 101 mL/min (A) (by C-G formula based on SCr of 0.44 mg/dL (L)). Liver Function Tests: Recent Labs  Lab 04/02/24 0643  AST 37  ALT 25  ALKPHOS 138*  BILITOT 0.6  PROT 7.4  ALBUMIN  3.5   No results for input(s): LIPASE, AMYLASE in the last 168 hours. Recent Labs  Lab 04/02/24 0739  AMMONIA <13   Coagulation Profile: No results for input(s): INR, PROTIME in the last 168 hours. Cardiac Enzymes: No results for input(s): CKTOTAL, CKMB,  CKMBINDEX, TROPONINI in the last 168 hours. BNP (last 3 results) Recent Labs    04/02/24 0706  PROBNP 311.0*   HbA1C: No results for input(s): HGBA1C in the last 72 hours.  CBG: Recent Labs  Lab 04/05/24 1626 04/05/24 2223 04/06/24 0800 04/06/24 1135 04/07/24 0005  GLUCAP 115* 127* 107* 137* 134*   Lipid Profile: No results for input(s): CHOL, HDL, LDLCALC, TRIG, CHOLHDL, LDLDIRECT in the last 72 hours. Thyroid  Function Tests: No results for input(s): TSH, T4TOTAL, FREET4, T3FREE, THYROIDAB in the last 72 hours. Anemia Panel: No results for input(s): VITAMINB12, FOLATE, FERRITIN, TIBC, IRON, RETICCTPCT in the last 72 hours. Sepsis Labs: No results for input(s): PROCALCITON, LATICACIDVEN in the last 168 hours.  No results found for this or any previous visit (from the past 240 hours).       Radiology Studies: No results found.  Scheduled Meds:  Chlorhexidine  Gluconate Cloth  6 each Topical Daily   enoxaparin  (LOVENOX ) injection  40 mg Subcutaneous Q24H   feeding supplement  237 mL Oral TID BM   Gerhardt's butt cream   Topical TID    HYDROmorphone  (DILAUDID ) injection  1 mg Intravenous Q2H   levothyroxine   150 mcg Oral QAC breakfast   nystatin   5 mL Oral QID   nystatin  cream   Topical BID   oxyCODONE   15 mg Oral Q12H   sodium chloride   1 g Oral TID WC   Continuous Infusions:  sodium chloride  75 mL/hr at 04/08/24 1018     LOS: 6 days    Leatrice LILLETTE Chapel, MD  04/08/2024, 3:41 PM

## 2024-04-08 NOTE — Plan of Care (Incomplete)
  Problem: Coping: Goal: Ability to adjust to condition or change in health will improve Outcome: Progressing   Problem: Fluid Volume: Goal: Ability to maintain a balanced intake and output will improve Outcome: Progressing   Problem: Metabolic: Goal: Ability to maintain appropriate glucose levels will improve Outcome: Progressing   Problem: Skin Integrity: Goal: Risk for impaired skin integrity will decrease Outcome: Progressing   Problem: Tissue Perfusion: Goal: Adequacy of tissue perfusion will improve Outcome: Progressing   Problem: Activity: Goal: Risk for activity intolerance will decrease Outcome: Progressing   Problem: Coping: Goal: Level of anxiety will decrease Outcome: Progressing   Problem: Elimination: Goal: Will not experience complications related to bowel motility Outcome: Progressing   Problem: Pain Managment: Goal: General experience of comfort will improve and/or be controlled Outcome: Progressing   Problem: Health Behavior/Discharge Planning: Goal: Ability to manage health-related needs will improve Outcome: Not Progressing   Problem: Nutritional: Goal: Maintenance of adequate nutrition will improve Outcome: Not Progressing   Problem: Nutrition: Goal: Adequate nutrition will be maintained Outcome: Not Progressing

## 2024-04-08 NOTE — Progress Notes (Signed)
 [id number] Liaison Note   Received request from [facility] manager, Tawni for patient interest in [facility]. Eligibility confirmed.    Met with patient's caregiver/friend, Hildegard. He spoke with the patient at length about going to [facility]. Mr. Centola agreed and would like Hildegard to speak for him. Hildegard stated that he was going to [facility] to sign consents, then decided that he would like for the hospital TOC to help find placement for Mr. Dancy. He is also going to reach out to their mosque for support.    Will follow up tomorrow.    Thank you for allowing us  to participate in this patient's care.   Greig Basket, BSN RN [facility] Liaison [phone]

## 2024-04-08 NOTE — Progress Notes (Signed)
 Daily Progress Note   Patient Name: Bob Ewing       Date: 04/08/2024 DOB: October 12, 1970  Age: 53 y.o. MRN#: [id number] Attending Physician: Rosario Leatrice FERNS, MD Primary Care Physician: [name], MD Admit Date: 04/02/2024  Reason for Consultation/Follow-up: Establishing goals of care  Subjective:  Awake but confused, friend Hildegard has arrived from out of town, met with patient, his friend alongside [facility] Psychiatric colleague Environmental education officer.    Length of Stay: 6  Current Medications: Scheduled Meds:   Chlorhexidine  Gluconate Cloth  6 each Topical Daily   enoxaparin  (LOVENOX ) injection  40 mg Subcutaneous Q24H   feeding supplement  237 mL Oral TID BM   Gerhardt's butt cream   Topical TID    HYDROmorphone  (DILAUDID ) injection  1 mg Intravenous Q2H   levothyroxine   150 mcg Oral QAC breakfast   nystatin   5 mL Oral QID   nystatin  cream   Topical BID   oxyCODONE   15 mg Oral Q12H   sodium chloride   1 g Oral TID WC    Continuous Infusions:  sodium chloride  75 mL/hr at 04/08/24 1018    PRN Meds: acetaminophen  **OR** acetaminophen , albuterol , ondansetron  **OR** ondansetron  (ZOFRAN ) IV, mouth rinse, oxyCODONE   Physical Exam         Appears chronically ill Resting in bed Regular work of breathing No edema Appears pale  Vital Signs: BP (!) 90/58 (BP Location: Left Arm)   Pulse (!) 105   Temp 98.2 F (36.8 C) (Oral)   Resp 20   Ht 5' 7 (1.702 m)   Wt 78.9 kg   SpO2 (!) 81%   BMI 27.25 kg/m  SpO2: SpO2: (!) 81 % O2 Device: O2 Device: Room Air O2 Flow Rate:    Intake/output summary:  Intake/Output Summary (Last 24 hours) at 04/08/2024 1128 Last data filed at 04/08/2024 0951 Gross per 24 hour  Intake 120 ml  Output 300 ml  Net -180 ml   LBM: Last BM Date : 04/04/24 Baseline Weight: Weight:  78.9 kg Most recent weight: Weight: 78.9 kg       Palliative Assessment/Data:      Patient Active Problem List   Diagnosis Date Noted   Hyponatremia 04/02/2024   Hyperlipidemia 04/02/2021   Ischemic stroke (HCC)    Large pleural effusion 10/14/2020   Hypertension  Pleural effusion    Hypercalcemia 10/02/2020   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 was receiving chemotherapy Admitted to hospital medicine service for severe weakness dehydration hyponatremia and failure to thrive Patient sees palliative care at the cancer center and is on long-acting as well as short acting opioids Patient reportedly had been saying at the time of admission that he knows he is dying of cancer and that he is not going to make it Palliative consult for CODE STATUS about goals of care discussions has been requested  Assessment:  Functional decline Cancer related cachexia  Recommendations/Plan: DNR DNI Recommend ongoing comfort measures IV Dilaudid  PRN Discussed with Dr Sherrod on 9-4, agree with recommendation for residential hospice, appreciate [facility]  assistance for [facility] evaluation.  Discussed at bedside with patient, TOC and friend Hildegard - recommend residential hospice.    Goals of Care and Additional Recommendations: Limitations on Scope of Treatment: comfort care.   Code Status: DNR DNI as of 04-04-24.     Code Status Orders  (From admission, onward)           Start     Ordered   04/02/24 1221  Full code  Continuous       Question:  By:  Answer:  Consent: discussion documented in EHR   04/02/24 1221           Code Status  History     Date Active Date Inactive Code Status Order ID Comments User Context   10/14/2020 0706 10/15/2020 2055 Full Code [id number]  Charlton Evalene RAMAN, MD ED       Prognosis:  Hours to days.   Discharge Planning: Residential hospice.   Care plan was discussed with  patient  TOC friend Hildegard IDT    Thank you for allowing the Palliative Medicine Team to assist in the care of this patient.  mod MDM     Greater than 50%  of this time was spent counseling and coordinating care related to the above assessment and plan.  Lonia Serve, MD  Please contact Palliative Medicine Team phone at [phone] for questions and concerns.

## 2024-04-09 DIAGNOSIS — Z515 Encounter for palliative care: Secondary | ICD-10-CM

## 2024-04-09 DIAGNOSIS — R627 Adult failure to thrive: Secondary | ICD-10-CM

## 2024-04-09 DIAGNOSIS — Z79899 Other long term (current) drug therapy: Secondary | ICD-10-CM

## 2024-04-09 DIAGNOSIS — Z7189 Other specified counseling: Secondary | ICD-10-CM | POA: Diagnosis not present

## 2024-04-09 DIAGNOSIS — Z711 Person with feared health complaint in whom no diagnosis is made: Secondary | ICD-10-CM

## 2024-04-09 DIAGNOSIS — G893 Neoplasm related pain (acute) (chronic): Secondary | ICD-10-CM | POA: Diagnosis not present

## 2024-04-09 DIAGNOSIS — C649 Malignant neoplasm of unspecified kidney, except renal pelvis: Secondary | ICD-10-CM

## 2024-04-09 DIAGNOSIS — Z66 Do not resuscitate: Secondary | ICD-10-CM

## 2024-04-09 DIAGNOSIS — C642 Malignant neoplasm of left kidney, except renal pelvis: Secondary | ICD-10-CM | POA: Diagnosis not present

## 2024-04-09 LAB — GLUCOSE, CAPILLARY: Glucose-Capillary: 146 mg/dL — ABNORMAL HIGH (ref 70–99)

## 2024-04-09 MED ORDER — BIOTENE DRY MOUTH MT LIQD: 15.0000 mL | OROMUCOSAL | Status: DC | PRN

## 2024-04-09 MED ORDER — GLYCOPYRROLATE 0.2 MG/ML IJ SOLN: 0.2000 mg | INTRAMUSCULAR | Status: DC | PRN

## 2024-04-09 MED ORDER — HYDROMORPHONE HCL-NACL 50-0.9 MG/50ML-% IV SOLN
0.5000 mg/h | INTRAVENOUS | Status: DC
  Administered 2024-04-09: 0.5 mg/h via INTRAVENOUS
  Administered 2024-04-10: 1 mg/h via INTRAVENOUS
  Filled 2024-04-09 (×2): qty 50

## 2024-04-09 MED ORDER — HALOPERIDOL LACTATE 5 MG/ML IJ SOLN: 0.5000 mg | INTRAMUSCULAR | Status: DC | PRN

## 2024-04-09 MED ORDER — HALOPERIDOL LACTATE 5 MG/ML IJ SOLN
1.0000 mg | Freq: Once | INTRAMUSCULAR | Status: AC
  Administered 2024-04-09: 1 mg via INTRAVENOUS
  Filled 2024-04-09: qty 1
  Filled 2024-04-09: qty 0.2

## 2024-04-09 MED ORDER — HALOPERIDOL 2 MG PO TABS
2.0000 mg | ORAL_TABLET | Freq: Four times a day (QID) | ORAL | Status: DC | PRN
Start: 2024-04-09 — End: 2024-04-10
  Filled 2024-04-09: qty 2

## 2024-04-09 MED ORDER — BISACODYL 10 MG RE SUPP: 10.0000 mg | Freq: Every day | RECTAL | Status: DC | PRN

## 2024-04-09 MED ORDER — SENNA 8.6 MG PO TABS
2.0000 | ORAL_TABLET | Freq: Two times a day (BID) | ORAL | Status: DC
  Administered 2024-04-09: 17.2 mg via ORAL
  Filled 2024-04-09: qty 2

## 2024-04-09 MED ORDER — GLYCOPYRROLATE 1 MG PO TABS: 1.0000 mg | ORAL_TABLET | ORAL | Status: DC | PRN

## 2024-04-09 MED ORDER — GLYCOPYRROLATE 0.2 MG/ML IJ SOLN
0.2000 mg | INTRAMUSCULAR | Status: DC | PRN
  Administered 2024-04-10: 0.2 mg via INTRAVENOUS
  Filled 2024-04-09: qty 1

## 2024-04-09 MED ORDER — BISACODYL 10 MG RE SUPP
10.0000 mg | Freq: Once | RECTAL | Status: AC
  Administered 2024-04-09: 10 mg via RECTAL
  Filled 2024-04-09: qty 1

## 2024-04-09 MED ORDER — HYDROMORPHONE BOLUS VIA INFUSION
0.5000 mg | INTRAVENOUS | Status: DC | PRN
  Administered 2024-04-09 (×2): 0.5 mg via INTRAVENOUS
  Administered 2024-04-09 – 2024-04-10 (×3): 1 mg via INTRAVENOUS

## 2024-04-09 MED ORDER — OXYCODONE HCL 5 MG PO TABS: 10.0000 mg | ORAL_TABLET | ORAL | Status: DC | PRN

## 2024-04-09 MED ORDER — POLYVINYL ALCOHOL 1.4 % OP SOLN: 1.0000 [drp] | Freq: Four times a day (QID) | OPHTHALMIC | Status: DC | PRN

## 2024-04-09 MED ORDER — HALOPERIDOL 2 MG PO TABS: 2.0000 mg | ORAL_TABLET | Freq: Four times a day (QID) | ORAL | Status: DC | PRN

## 2024-04-09 MED ORDER — HALOPERIDOL LACTATE 5 MG/ML IJ SOLN: 2.0000 mg | INTRAMUSCULAR | Status: DC | PRN

## 2024-04-09 NOTE — Progress Notes (Signed)
 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  Historically noted to have had pain along left flank and between ribs and spine.  Seen by oncology 12/28/2023. Was having increased pains during that visit and also worried about progression of disease.  He was recommended for undergoing repeat CT in approximately 3 weeks. He was last seen on 03/13/2024 with signs of worsening functional decline.  He was having increased pain requiring treatment with Xtampza  and Percocet as managed by palliative care outpatient.  Also developing worsening lower extremity edema, hyponatremia, scrotal swelling, constipation, generalized fatigue. He was admitted for overall failure to thrive and palliative care was consulted for ongoing GOC discussions. He developed transient confusion as well with episodes of screaming out in pain, removing IVs, and generalized confusion. After discussions during hospitalization, general plan of care is focused around comfort care and pain control.  Tentative plan was for discharge to [facility] if possible.   Assessment & Plan:   Failure to thrive Stage IV RCC - Originally diagnosed February 2022; now with pulmonary involvement and mediastinal lymphadenopathy with overall progressed disease; recent CT A/P on 04/02/2024 shows complex right renal lesions new and increased from prior imaging concerning for metastasis.  Also noted to have left proximal humeral metastasis progressive since 06/30/2022. -Patient now having significant episodes of confusion and agitation along with uncontrolled pain - Palliative care has been following during hospitalization; plan at this time is continuing comfort care approach  which is very appropriate -Patient pulled IV out overnight and screaming out in pain this morning; IV to be replaced and patient being started on Dilaudid  infusion per palliative care -If unable to discharge to [facility], reasonable to keep him hospitalized with inpatient comfort care   Hyponatremia - Likely multifactorial in setting of underlying malignancy however - continue comfort care  DMII  Hypothyroidism HTN Hypercalcemia Normocytic anemia - now on comfort care approach  Interval History:  Patient has been hospitalized 7 days and still having episodes of uncontrolled pain and confusion.  Reportedly fell out of bed overnight and also pulled out his IV. Remains confused during interview today with interpreter.  Plan is for continuing with comfort care approach.  IV to be replaced and focusing on pain control.  Might need to remain in the hospital if unable to control symptoms to discharge to [facility].  Old records reviewed in assessment of this patient  Antimicrobials:   DVT prophylaxis:     Code Status:   Code Status: Do not attempt resuscitation (DNR) - Comfort care  Mobility Assessment (Last 72 Hours)     Mobility Assessment     Row Name 04/09/24 1112 04/08/24 2100 04/08/24 0951 04/07/24 2101 04/07/24 1029   Does the patient have exclusion criteria? No - Perform mobility assessment No - Perform mobility assessment No - Perform mobility assessment No - Perform mobility assessment No - Perform mobility assessment   What is the highest level of mobility based on the mobility assessment? Level 1 (Bedfast) - Unable to balance while sitting on edge of bed Level 1 (Bedfast) - Unable to balance while sitting on edge of bed Level 2 (Chairfast) - Balance while sitting on edge of  bed and cannot stand Level 1 (Bedfast) - Unable to balance while sitting on edge of bed Level 2 (Chairfast) - Balance while sitting on edge of bed and cannot stand   Is the above level different  from baseline mobility prior to current illness? -- -- Yes - Recommend PT order -- Yes - Recommend PT order    Row Name 04/06/24 2236           Does the patient have exclusion criteria? No - Perform mobility assessment       What is the highest level of mobility based on the mobility assessment? Level 3 (Stands with assistance) - Balance while standing  and cannot march in place       Is the above level different from baseline mobility prior to current illness? Yes - Recommend PT order          Barriers to discharge: none Disposition Plan:  Inpt comfort care vs [facility] HH orders placed: n/a Status is: Inpt  Objective: Blood pressure (!) 81/50, pulse (!) 109, temperature 98.2 F (36.8 C), resp. rate 19, height 5' 7 (1.702 m), weight 78.9 kg, SpO2 96%.  Examination:  Physical Exam Constitutional:      Comments: Ill-appearing adult man lying in bed uncomfortable and moaning in pain.  Obvious confusion despite using language interpreter  HENT:     Head: Normocephalic and atraumatic.     Mouth/Throat:     Mouth: Mucous membranes are dry.  Eyes:     Extraocular Movements: Extraocular movements intact.  Cardiovascular:     Rate and Rhythm: Normal rate and regular rhythm.  Pulmonary:     Effort: Pulmonary effort is normal. No respiratory distress.     Breath sounds: Normal breath sounds. No wheezing.  Abdominal:     General: Bowel sounds are normal. There is no distension.     Palpations: Abdomen is soft.     Tenderness: There is abdominal tenderness.  Musculoskeletal:        General: Normal range of motion.     Cervical back: Normal range of motion and neck supple.     Thoracic back: Tenderness (Generalized) present.     Lumbar back: Tenderness (Generalized) present.  Skin:    General: Skin is warm and dry.  Neurological:     General: No focal deficit present.  Psychiatric:        Mood and Affect: Mood normal.        Behavior: Behavior normal.      Consultants:   Palliative Care   Procedures:    Data Reviewed: Results for orders placed or performed during the hospital encounter of 04/02/24 (from the past 24 hours)  Glucose, capillary     Status: Abnormal   Collection Time: 04/08/24  8:55 PM  Result Value Ref Range   Glucose-Capillary 146 (H) 70 - 99 mg/dL  Glucose, capillary     Status: Abnormal   Collection Time: 04/09/24  7:51 AM  Result Value Ref Range   Glucose-Capillary 146 (H) 70 - 99 mg/dL    I have reviewed pertinent nursing notes, vitals, labs, and images as necessary. I have ordered labwork to follow up on as indicated.  I have reviewed the last notes from staff over past 24 hours. I have discussed patient's care plan and test results with nursing staff, CM/SW, and other staff as appropriate.  Time spent: Greater than 50% of the 55 minute visit was spent in counseling/coordination of care for the patient as  laid out in the A&P.   LOS: 7 days   Alm Apo, MD [facility] 04/09/2024, 4:02 PM

## 2024-04-09 NOTE — Progress Notes (Addendum)
 During the evening shift patient behavior much different than the night before. The shift began with patient yelling out non stop and much louder than before. Several visits were made with patient for schedule pain medications and request to stop yelling. With no success night NP notified and request was made for Haldol . Medication given with no effect. Understanding that patient is lonely I would spend several minutes in the room. Made accommodations in the room by lowering the light, turning the television on to comfort viewing. Patient later requested to get up out of bed. CNA went to assist patient to sit on the side of the bed, but was stalling and decided not to sit up. With bed alarm set on sensitive motion patient began to shake the bed side rail to cause alarm to go off. Again, entering room each time alarm went off, patient would be pulling gown and bedcovers off, then it went to removing dressing to buttocks, and lastly his IV. Setting bed alarm higher. Patient began to sit on the side of the bed than standing and walking while pull foley and IV tubing (before IV was removed). Spending more and more time in the room. Bed alarm went off again, heard a noise, went to patient room. Patient was on the floor naked. Gown lying on the floor. No noted broken bones, cuts, abrasion or injures of any kind at this time. Went to get assistance from CNA to help get patient in the bed and take vital signs. Patient was assisted to bed , O2 was applied due to decrease saturation. Nighttime secretary who understands patient language and communicates with patient, stated that he was cold, felt like his heart was up and down, and medications making him feel some type of way. Per secretary patient also stated, that he put his self on the floor. Friend came to sign papers. Drop me off to die, experiment on me. Are just a few statements he made. Charge Nurse and NP made aware. Tele sitter order placed.

## 2024-04-09 NOTE — Progress Notes (Signed)
 Daily Progress Note   Patient Name: Bob Ewing       Date: 04/09/2024 DOB: [birth year]  Age: 53 y.o. MRN#: [id number] Attending Physician: Patsy Lenis, MD Primary Care Physician: [name], MD Admit Date: 04/02/2024 Length of Stay: 7 days  Reason for Consultation/Follow-up: Establishing goals of care and Pain control  Subjective:   Reviewed EMR including recent documentation from hospitalist.  Overnight patient was agitated and getting out of the bed.  Patient ripped out his IV due to agitation. Because of lack of IV access, patient could not receive scheduled IV Dilaudid .  Patient did receive as needed oxycodone  5 mg x 1 dose.  When presenting to bedside to see patient, he is yelling and agitated.  Patient is confused.  No visitors present at bedside.  Discussed care with RN, hospitalist, TOC, and ACC liaison to coordinate care.  Plan is for patient to go to inpatient hospice at [facility].  Patient's friend, Ali Alnavey, has been assisting with this coordination of care.  TOC attempting to contact friend regarding transfer to [facility] when able. Patient is comfort focused care at this time and has clear signs of worsening pain and agitation.  Discussed with team and will appropriately start IV Dilaudid  infusion for pain management.  Objective:   Vital Signs:  BP 111/67 (BP Location: Right Arm)   Pulse (!) 119   Temp 98.2 F (36.8 C)   Resp 20   Ht 5' 7 (1.702 m)   Wt 78.9 kg   SpO2 (!) 89%   BMI 27.25 kg/m   Physical Exam: General: Agitated, confused, grimacing Cardiovascular: Tachycardia noted Respiratory: no increased work of breathing noted, not in respiratory distress Neuro: Confused, agitated  Assessment & Plan:   Assessment: Patient is a 53 year old male with a past medical history of hypertension and stage IV renal cell carcinoma of the left kidney who was admitted on 04/02/2024 for management of severe weakness, failure to thrive, dehydration, and  hyponatremia.  After discussions, patient was transitioned to comfort focused care on 04/06/2024.  Palliative medicine team following along to assist with complex medical decision making and symptom management.  Recommendations/Plan: # Complex medical decision making/goals of care:  - Patient remains confused.  Plan for patient to transfer to inpatient hospice at [facility] once coordinated.  Patient's friend,  Hildegard, assisting patient.  TOC and ACC liaison assisting with coordination of care.  Palliative medicine team continuing to follow with patient's medical journey. -Discontinued interventions not focused on comfort including IV fluids and medications.  -  Code Status: Do not attempt resuscitation (DNR) - Comfort care  # Symptom management Patient is receiving these palliative interventions for symptom management with an intent to improve quality of life.     -Pain/Dyspnea, severe acute on chronic pain in setting of metastatic renal cell carcinoma                               -Start IV Dilaudid  continuous infusion with titratable dosing.  Start as needed bolus dosing 0.5-1 mg every 15 minutes from infusion.  Continue to adjust based on patient's symptom burden.   - Discontinue oral oxycodone  since starting IV Dilaudid  infusion                  -Anxiety/agitation, in the setting of end-of-life care                              -  Reviewed recent EKG on 04/02/2024 noting QTc 442.   - Start IV Haldol  2 mg every 4 hours as needed   - Start oral Haldol  2-4 mg every 6 hours as needed   - Holding on initiation of Ativan  which shortage                 -Secretions, in the setting of end-of-life care                               -Start glycopyrrolate  as needed.  # Psychosocial Support:  -AlyseGLENWOOD Hildegard Rebel  # Discharge Planning: To Be Determined  Discussed with: Hospitalist, RN, TOC, ACC liaison  Thank you for allowing the palliative care team to participate in the care [name].  Tinnie Radar, DO Palliative Care Provider PMT # [phone]  If patient remains symptomatic despite maximum doses, please call PMT at [phone] between 0700 and 1900. Outside of these hours, please call attending, as PMT does not have night coverage.  Billing based on MDM: High  Problems Addressed: One or more chronic illnesses with severe exacerbation, progression, or side effects of treatment.  Risks: Parenteral controlled substances

## 2024-04-09 NOTE — Hospital Course (Signed)
 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  Historically noted to have had pain along left flank and between ribs and spine.  Seen by oncology 12/28/2023. Was having increased pains during that visit and also worried about progression of disease.  He was recommended for undergoing repeat CT in approximately 3 weeks. He was last seen on 03/13/2024 with signs of worsening functional decline.  He was having increased pain requiring treatment with Xtampza  and Percocet as managed by palliative care outpatient.  Also developing worsening lower extremity edema, hyponatremia, scrotal swelling, constipation, generalized fatigue. He was admitted for overall failure to thrive and palliative care was consulted for ongoing GOC discussions. He developed transient confusion as well with episodes of screaming out in pain, removing IVs, and generalized confusion. After discussions during hospitalization, general plan of care was focused around comfort care and pain control.  Palliative care also followed during hospitalization.  He continued to have uncontrolled pain and was started on a Dilaudid  infusion for comfort control. He had ongoing expected decline and passed naturally on 04/14/2024.

## 2024-04-09 NOTE — Plan of Care (Signed)
  Problem: Pain Managment: Goal: General experience of comfort will improve and/or be controlled Outcome: Progressing

## 2024-04-09 NOTE — TOC Progression Note (Signed)
 Transition of Care [facility]) - Progression Note    Patient Details  Name: Bob Ewing MRN: [id number] Date of Birth: 02/09/1971  [facility]) CM/SW Contact  [name], LCSW Phone Number: 04/09/2024, 1:48 PM  Clinical Narrative:    CSW attempted to call pt's friend Hildegard no answer left a VM requesting a return call. IPCM to follow.    Expected Discharge Plan: Hospice Medical Facility Barriers to Discharge: Continued Medical Work up               Expected Discharge Plan and Services                                               Social Drivers of Health (SDOH) Interventions SDOH Screenings   Food Insecurity: No Food Insecurity (04/02/2024)  Housing: Low Risk  (04/02/2024)  Transportation Needs: No Transportation Needs (04/02/2024)  Utilities: Not At Risk (04/02/2024)  Depression (PHQ2-9): Low Risk  (03/14/2024)  Tobacco Use: High Risk (04/02/2024)    Readmission Risk Interventions     No data to display

## 2024-04-09 NOTE — Progress Notes (Signed)
       Overnight   NAME: Bob Ewing MRN: [id number] DOB : [birth year]    Date of Service   04/09/2024   HPI/Events of Note    Notified by RN for fall. . Nursing staff was alerted by bed alarm. When nursing staff approached room patient was on the floor, stated I put myself on the floor. Patient also states that he is upset about going to [facility], and that he does not want to go.  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 Admitted through the ER with severe weakness, dehydration, hyponatremia and failure to thrive.  Bedside visit Patient is awake and oriented to person, place ,situation, and self.   No deformities, contusions, abrasions, punctures, bruising, tears, lacerations, swelling.      Interventions/ Plan   TeleSitter Fall precautions/protocol Continue all previous attending orders.      Lynwood Kipper BSN MSNA MSN ACNPC-AG Acute Care Nurse Practitioner [facility]

## 2024-04-10 DIAGNOSIS — Z79899 Other long term (current) drug therapy: Secondary | ICD-10-CM | POA: Diagnosis not present

## 2024-04-10 DIAGNOSIS — Z66 Do not resuscitate: Secondary | ICD-10-CM | POA: Diagnosis not present

## 2024-04-10 DIAGNOSIS — Z7189 Other specified counseling: Secondary | ICD-10-CM | POA: Diagnosis not present

## 2024-04-10 DIAGNOSIS — Z515 Encounter for palliative care: Secondary | ICD-10-CM | POA: Diagnosis not present

## 2024-04-10 DIAGNOSIS — R627 Adult failure to thrive: Secondary | ICD-10-CM | POA: Diagnosis not present

## 2024-04-10 DIAGNOSIS — C642 Malignant neoplasm of left kidney, except renal pelvis: Secondary | ICD-10-CM | POA: Diagnosis not present

## 2024-04-10 MED ORDER — HYDROMORPHONE BOLUS VIA INFUSION
1.0000 mg | INTRAVENOUS | Status: DC | PRN
  Administered 2024-04-10: 1 mg via INTRAVENOUS
  Administered 2024-04-10 – 2024-04-11 (×5): 1.5 mg via INTRAVENOUS

## 2024-04-10 NOTE — Progress Notes (Signed)
 [id number] [facility] Liaison Note   Received request from [facility] manager, Tawni for patient interest in [facility]. Eligibility confirmed.    Patient's friend Hildegard has reservations about signing the consents and he does not have any contact information for the patient's sister in [place]. TOC asking for consents to review with legal.   Will follow up tomorrow.    Thank you for allowing us  to participate in this patient's care.   Greig Basket, BSN RN [facility] Liaison [phone]

## 2024-04-10 NOTE — Progress Notes (Signed)
 Daily Progress Note   Patient Name: Bob Ewing       Date: 04/10/2024 DOB: 12/31/1970  Age: 53 y.o. MRN#: [id number] Attending Physician: Patsy Lenis, MD Primary Care Physician: [name], MD Admit Date: 04/02/2024 Length of Stay: 8 days  Reason for Consultation/Follow-up: Establishing goals of care and Pain control  Subjective:   Reviewed EMR including recent documentation from hospitalist and TOC.  TOC followed up with patient's friend, Hildegard.  Friend reported that he does not feel comfortable making decisions for the patient.  Do not have contact information for patient's sister who lives outside of the country.  TOC has discussed case with [facility] leadership to assist with management.  Plan had been for patient to go to [facility] though now awaiting determination. At time of EMR review patient continues to receive IV Dilaudid  infusion at 1 mg/h.  Patient received bolus dosing from IV Dilaudid  infusion with 0.5 x 2 doses and 1 mg x 3 doses.  Presented to bedside to see patient.  Patient sleeping comfortably in bed so did not awaken.  No visitors present at bedside.  Discussed care with hospitalist, RN, and TOC to coordinate care.  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  After discussions, patient was transitioned to comfort focused care on 04/06/2024.   Palliative medicine team following along to assist with complex medical decision making and symptom management.  Recommendations/Plan: # Complex medical decision making/goals of care:  - Patient was transition to full comfort focused care on 04/06/2024.  Had been plan for patient to transfer to inpatient hospice at [facility] once coordinated though patient's friend,  Hildegard, no longer wanting to assist with this.  TOC following up with leadership regarding this.  Palliative medicine team continuing to follow with patient's medical journey.  -  Code Status: Do not attempt resuscitation (DNR) - Comfort care  # Symptom management Patient is receiving these palliative interventions for symptom management with an intent to improve quality of life.     -Pain/Dyspnea, severe acute on chronic pain in setting of metastatic renal cell carcinoma                               - Continue IV Dilaudid  continuous infusion with titratable dosing.  Increase as needed bolus dosing to 1-1.5 mg every 15 minutes from infusion.  Continue to adjust based on patient's symptom burden.                  -Anxiety/agitation, in the setting of end-of-life care                              -Reviewed recent EKG on 04/02/2024 noting QTc 442.   - Continue IV Haldol   2 mg every 4 hours as needed   - Discontinue oral Haldol     - Holding on initiation of Ativan  with shortage                 -Secretions, in the setting of end-of-life care                               - Continue glycopyrrolate  as needed.  # Psychosocial Support:  -AlyseGLENWOOD Hildegard Rebel  # Discharge Planning: To Be Determined  Discussed with: Hospitalist, RN, TOC, ACC liaison  Thank you for allowing the palliative care team to participate in the care Brigido Borum.  Tinnie Radar, DO Palliative Care Provider PMT # [phone]  If patient remains symptomatic despite maximum doses, please call PMT at [phone] between 0700 and 1900. Outside of these hours, please  call attending, as PMT does not have night coverage.  Billing based on MDM: High  Problems Addressed: One or more chronic illnesses with severe exacerbation, progression, or side effects of treatment.   Risks: Parenteral controlled substances

## 2024-04-10 NOTE — TOC Progression Note (Signed)
 Transition of Care [facility]) - Progression Note    Patient Details  Name: Bob Ewing MRN: [id number] Date of Birth: 14-May-1971  [facility] CM/SW Contact  [name], LCSW Phone Number: 04/10/2024, 10:53 AM  Clinical Narrative:    CSW received a call from the pt's friend, Hildegard. He reported that he does not feel comfortable making decisions for the pt. He stated that he was discussing the situation with [facility] and was informed the pt does not have Medicaid, and he does not want to be the one responsible for causing the pt to incur a bill. He is asking the hospital to "make the best decision for him." He reports pt has a sister, however she does not live in this country and he does not have her contact information. CSW has discussed the case with [facility] leadership. IP Care Management to follow.   Expected Discharge Plan: Hospice Medical Facility Barriers to Discharge: Continued Medical Work up               Expected Discharge Plan and Services                                               Social Drivers of Health (SDOH) Interventions SDOH Screenings   Food Insecurity: No Food Insecurity (04/02/2024)  Housing: Low Risk  (04/02/2024)  Transportation Needs: No Transportation Needs (04/02/2024)  Utilities: Not At Risk (04/02/2024)  Depression (PHQ2-9): Low Risk  (03/14/2024)  Tobacco Use: High Risk (04/02/2024)    Readmission Risk Interventions     No data to display

## 2024-04-10 NOTE — Progress Notes (Signed)
 Progress Note    Bob Ewing   FMW:[id number]  DOB: [birth year]  DOA: 04/02/2024     8 PCP: [name], MD  Initial CC: Failure to thrive  Hospital Course: Mr. 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  Historically noted to have had pain along left flank and between ribs and spine.  Seen by oncology 12/28/2023. Was having increased pains during that visit and also worried about progression of disease.  He was recommended for undergoing repeat CT in approximately 3 weeks. He was last seen on 03/13/2024 with signs of worsening functional decline.  He was having increased pain requiring treatment with Xtampza  and Percocet as managed by palliative care outpatient.  Also developing worsening lower extremity edema, hyponatremia, scrotal swelling, constipation, generalized fatigue. He was admitted for overall failure to thrive and palliative care was consulted for ongoing GOC discussions. He developed transient confusion as well with episodes of screaming out in pain, removing IVs, and generalized confusion. After discussions during hospitalization, general plan of care is focused around comfort care and pain control.  Tentative plan was for discharge to [facility] if possible.   Assessment & Plan:   Failure to thrive Stage IV RCC - Originally diagnosed February 2022; now with pulmonary involvement and mediastinal lymphadenopathy with overall progressed disease; recent CT A/P on 04/02/2024 shows complex right renal lesions new and increased from prior imaging concerning for metastasis.  Also noted to have left proximal humeral metastasis progressive since 06/30/2022. -Patient now having significant episodes of confusion and agitation along with uncontrolled pain - Palliative care has been following during hospitalization; plan at this time is continuing comfort care approach  which is very appropriate - IV replaced on 04/09/2024 and Dilaudid  drip started; patient much more comfortable after initiation -If unable to discharge to [facility], reasonable to keep him hospitalized with inpatient comfort care   Hyponatremia - Likely multifactorial in setting of underlying malignancy however - continue comfort care  DMII  Hypothyroidism HTN Hypercalcemia Normocytic anemia - now on comfort care approach  Interval History:  Patient much more comfortable appearing today and no longer screaming out. Resting very comfortably but awakens easily to voice.  Speech is more unintelligible and he appears more confused in general as expected.  Old records reviewed in assessment of this patient  Antimicrobials:   DVT prophylaxis:     Code Status:   Code Status: Do not attempt resuscitation (DNR) - Comfort care  Mobility Assessment (Last 72 Hours)     Mobility Assessment     Row Name 04/10/24 1210 04/09/24 2145 04/09/24 1112 04/08/24 2100 04/08/24 0951   Does the patient have exclusion criteria? No - Perform mobility assessment No - Perform mobility assessment No - Perform mobility assessment No - Perform mobility assessment No - Perform mobility assessment   What is the highest level of mobility based on the mobility assessment? Level 0 (Hold) - At risk for rapid decline or arrest with movement or actively dying Level 1 (Bedfast) - Unable to balance while sitting on edge of bed Level 1 (Bedfast) - Unable to balance while sitting on edge of bed Level 1 (Bedfast) - Unable to balance while sitting on edge of bed Level 2 (Chairfast) - Balance while sitting on edge of bed and cannot stand   Is the above level different from baseline mobility prior to current illness? -- No -  Consider discontinuing PT/OT -- -- Yes - Recommend PT order    Row Name 04/07/24 2101           Does the patient have exclusion criteria? No - Perform mobility assessment       What is the highest  level of mobility based on the mobility assessment? Level 1 (Bedfast) - Unable to balance while sitting on edge of bed          Barriers to discharge: none Disposition Plan:  Inpt comfort care vs [facility] HH orders placed: n/a Status is: Inpt  Objective: Blood pressure (!) 72/38, pulse 97, temperature 97.8 F (36.6 C), temperature source Oral, resp. rate 12, height 5' 7 (1.702 m), weight 78.9 kg, SpO2 97%.  Examination:  Physical Exam Constitutional:      Comments: Much more calm appearing and resting comfortably.  Arouses to voice  HENT:     Head: Normocephalic and atraumatic.     Mouth/Throat:     Mouth: Mucous membranes are dry.  Eyes:     Extraocular Movements: Extraocular movements intact.  Cardiovascular:     Rate and Rhythm: Normal rate and regular rhythm.  Pulmonary:     Effort: Pulmonary effort is normal. No respiratory distress.     Breath sounds: Normal breath sounds. No wheezing.  Abdominal:     General: Bowel sounds are normal. There is no distension.     Palpations: Abdomen is soft.     Tenderness: There is abdominal tenderness.  Musculoskeletal:        General: Normal range of motion.     Cervical back: Normal range of motion and neck supple.     Thoracic back: Tenderness (Generalized) present.     Lumbar back: Tenderness (Generalized) present.  Skin:    General: Skin is warm and dry.  Neurological:     Comments: More unintelligible speech and grossly confused  Psychiatric:        Mood and Affect: Mood normal.        Behavior: Behavior normal.      Consultants:  Palliative Care   Procedures:    Data Reviewed: No results found for this or any previous visit (from the past 24 hours).   I have reviewed pertinent nursing notes, vitals, labs, and images as necessary. I have ordered labwork to follow up on as indicated.  I have reviewed the last notes from staff over past 24 hours. I have discussed patient's care plan and test results with  nursing staff, CM/SW, and other staff as appropriate.  Time spent: Greater than 50% of the 55 minute visit was spent in counseling/coordination of care for the patient as laid out in the A&P.   LOS: 8 days   Alm Apo, MD [facility] 04/10/2024, 4:39 PM

## 2024-04-11 DIAGNOSIS — Z7189 Other specified counseling: Secondary | ICD-10-CM | POA: Diagnosis not present

## 2024-04-11 DIAGNOSIS — R627 Adult failure to thrive: Secondary | ICD-10-CM | POA: Diagnosis not present

## 2024-04-11 DIAGNOSIS — C642 Malignant neoplasm of left kidney, except renal pelvis: Secondary | ICD-10-CM | POA: Diagnosis not present

## 2024-04-11 DIAGNOSIS — G893 Neoplasm related pain (acute) (chronic): Secondary | ICD-10-CM | POA: Diagnosis not present

## 2024-04-19 ENCOUNTER — Other Ambulatory Visit: Payer: Self-pay

## 2024-04-22 ENCOUNTER — Inpatient Hospital Stay

## 2024-04-22 ENCOUNTER — Inpatient Hospital Stay: Admitting: Internal Medicine

## 2024-05-01 NOTE — Progress Notes (Addendum)
   04-12-24 0554  Attending Physican Contact  Attending Physician Notified Y  Attending Physician (First and Last Name) Maude Horns NP  Post Mortem Checklist  Date of Death 2024-04-12  Time of Death 0534  Pronounced By Levander Councilman RN and Tinnie Bud RN  Next of kin notified Yes  Name of next of kin notified of death Hildegard Minium  Contact Person's Relationship to Patient Friend  Contact Person's Phone Number [phone]  Was the patient a No Code Blue or a Limited Code Blue? Yes  Did the patient die unattended? No  Patient restrained (physical/manual hold/chemical)? *See row information* Not applicable  Body preparation complete Y  [employer] (previously known as Administrator, sports)  Notification Date 04/12/2024 (Talked to [facility] Spreher)  Notification Time [phone]  [employer] Number [phone] Corrine Spreher)  Is patient a potential donor? Y  Donation Type Eyes  Eye prep completed Yes  Autopsy  Autopsy requested by MD or Family ( Non ME Case) N/A  Patient and Hospital Property Returned  Patient is satisfied that all belongings have been returned? Not applicable  Dermatherapy linen/gowns NOT sent with patient or transporter Not applicable  Dead on Arrival (Emergency Department)  Patient dead on arrival? No  Medical Examiner  Is this a medical examiner's case? N   This nurse tried to contact another friend Aida Grippe from contact list but was unable to reach. Post mortem care provided. Saline drops placed on patient's eye and covered with moistened saline gauze.

## 2024-05-01 NOTE — Progress Notes (Signed)
    Patient Name: [name]           DOB: [birth year]  MRN: [id number]       OVERNIGHT EVENT    Notified by RN that patient has expired at 0534.  2 RN verified. Patient was comfort care.   Family has been notified by RN.    Mubarak Bevens, DNP, ACNPC- AG [facility] [place]

## 2024-05-01 NOTE — Discharge Summary (Signed)
 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  Historically noted to have had pain along left flank and between ribs and spine.  Seen by oncology 12/28/2023. Was having increased pains during that visit and also worried about progression of disease.  He was recommended for undergoing repeat CT in approximately 3 weeks. He was last seen on 03/13/2024 with signs of worsening functional decline.  He was having increased pain requiring treatment with Xtampza  and Percocet as managed by palliative care outpatient.  Also developing worsening lower extremity edema, hyponatremia, scrotal swelling, constipation, generalized fatigue. He was admitted for overall failure to thrive and palliative care was consulted for ongoing GOC discussions. He developed transient confusion as well with episodes of screaming out in pain, removing IVs, and generalized confusion. After discussions during hospitalization, general plan of care was focused around comfort care and pain control.  Palliative care also followed during hospitalization.  He continued to have uncontrolled pain and was started on a Dilaudid  infusion for comfort control. He had ongoing expected decline and passed naturally on 05-08-24.  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   Metastatic renal cell carcinoma (HCC) 04/09/2024    Priority: 2.   Goals of care, counseling/discussion 04/09/2024    Priority: 3.   High risk medication use 04/09/2024   Concern about  end of life 04/09/2024   DNR (do not resuscitate) 04/09/2024   Cancer associated pain 04/09/2024   Counseling and coordination of care 04/09/2024   Medication management 04/09/2024   Palliative care encounter 04/09/2024   Hyponatremia Apr 29, 2024    Resolved Hospital Problems  No resolved problems to display.    The results of significant diagnostics from this hospitalization (including imaging, microbiology, ancillary and laboratory) are listed below for reference.    Significant Diagnostic Studies: CT Angio Chest PE W and/or Wo Contrast Result Date: 04/29/24 CLINICAL DATA:  Stage IV renal cell carcinoma. Weakness over last 2 months. Swelling in bilateral lower extremities. Decreased p.o. intake. * Tracking Code: BO * EXAM: CT ANGIOGRAPHY CHEST CT ABDOMEN AND PELVIS WITH CONTRAST TECHNIQUE: Multidetector CT imaging of the chest was performed using the standard protocol during bolus administration of intravenous contrast. Multiplanar CT image reconstructions and MIPs were obtained to evaluate the vascular anatomy. Multidetector CT imaging of the abdomen and pelvis was performed using the standard protocol during bolus administration of intravenous contrast. RADIATION DOSE REDUCTION: This exam was performed according to the departmental dose-optimization program which includes automated exposure control, adjustment of the mA and/or kV according to patient size and/or use of iterative reconstruction technique. CONTRAST:  OMNIPAQUE  IOHEXOL  350 MG/ML SOLN COMPARISON:  Chest radiograph of earlier today. Chest abdomen and pelvic CTs of 03/14/2024. FINDINGS: CTA CHEST FINDINGS Cardiovascular: The quality of this exam for evaluation of pulmonary embolism is good. The bolus is well timed. There is minimal motion degradation. No evidence of pulmonary embolism. Bovine arch. Aortic atherosclerosis. Normal heart size, without pericardial effusion. Mediastinum/Nodes: No supraclavicular adenopathy. Right  paratracheal adenopathy at 2.5  cm on 57/4, similar. Right hilar adenopathy at 1.8 cm on 62/4, poorly evaluated on the prior noncontrast exam. Left suprahilar/AP window node of 11 mm on 59/4, similar. Right retrocrural node of 1.4 cm in 1/20 7/4, similar to 1.5 cm previously. Lungs/Pleura: Trace left pleural fluid is similar. Bilateral pulmonary nodules/metastasis. An index superior segment left lower lobe pulmonary nodule measures 11 mm on 54/12 versus 12 mm on the prior. Slightly less well-defined today. Medial right apical nodule measures 1.9 x 1.5 cm on 35/12 and is similar to on the prior (when remeasured). Musculoskeletal: Lytic lesion within the left humeral head on 24/14 at 2.8 cm. Similar to on the prior, but felt to be increased compared to 06/30/2022. Remote ninth posterior right rib fracture. Review of the MIP images confirms the above findings. CT ABDOMEN and PELVIS FINDINGS Hepatobiliary: Normal liver. Gallbladder sludge or noncalcified stones. Borderline gallbladder distension without specific evidence of acute cholecystitis or biliary duct dilatation. Pancreas: Normal, without mass or ductal dilatation. Spleen: Subcentimeter low-density subcapsular splenic lesions are of doubtful clinical significance. Adrenals/Urinary Tract: Normal right adrenal gland. Complex hypoattenuating right renal lesions including up to 1.3 cm on 23/2. New and increased since the most recent contrast enhanced exam of 01/30/2023. Infiltrative left renal mass with direct extension into the abdominal retroperitoneum again identified. Estimated at 13.4 x 10.5 cm on 39/2. Compare 12.9 x 10.4 cm on the prior exam. Results in moderate left-sided hydronephrosis and decreased left renal function/excretion on delayed images. Normal urinary bladder. Stomach/Bowel: Normal stomach, without wall thickening. Scattered colonic diverticula. Normal terminal ileum. Vascular/Lymphatic: Aortic atherosclerosis. Mass effect upon the IVC from the  infiltrative retroperitoneal mass. No thrombus. Direct tumor extension and/or adenopathy throughout the abdominal retroperitoneum as detailed above. Example nodal conglomerate at the level of the interpolar right kidney measuring 9.5 x 5.1 cm on 43/2. Compare 9.7 x 5.2 cm on the prior, suggesting stability. Pelvic adenopathy, with an index right external iliac node measuring 1.3 cm on 65/2 versus 1.1 cm on the prior. Reproductive: Normal prostate. Inguinal position of the right testicle. Other: Trace pelvic fluid mildly increased. No free intraperitoneal air. Musculoskeletal: Direct tumor extension into the left paraspinous musculature, as evidenced by heterogeneous enhancement including on 33/2. Review of the MIP images confirms the above findings. IMPRESSION: CT CHEST IMPRESSION 1.  No evidence of pulmonary embolism. 2. Similar pulmonary and thoracic nodal metastasis since 03/14/2024. 3. Similar trace left pleural fluid. 4. 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 5.  Aortic Atherosclerosis (ICD10-I70.0). CT ABDOMEN AND PELVIS IMPRESSION 1. Similar appearance of infiltrative left renal mass with direct retroperitoneal and left paraspinous muscular extension. Concurrent or contiguous abdominal retroperitoneal and pelvic adenopathy. 2. No bowel obstruction or other superimposed acute process 3. Gallbladder sludge or noncalcified stones with borderline distention but no specific evidence of acute cholecystitis 4.  Aortic Atherosclerosis (ICD10-I70.0). 5. Complex right renal lesions which are new and increased since the most recent contrast-enhanced exam of 2024. Suspicious for metastasis. Electronically Signed   By: Rockey Kilts M.D.   On: 04/02/2024 11:27   CT ABDOMEN PELVIS W CONTRAST Result Date: 04/02/2024 CLINICAL DATA:  Stage IV renal cell carcinoma. Weakness over last 2 months. Swelling in bilateral lower extremities. Decreased p.o. intake. * Tracking Code: BO * EXAM: CT  ANGIOGRAPHY CHEST CT ABDOMEN AND PELVIS WITH CONTRAST TECHNIQUE: Multidetector CT imaging of the chest was performed using the standard protocol during bolus administration of intravenous contrast. Multiplanar CT image reconstructions and MIPs were obtained to  evaluate the vascular anatomy. Multidetector CT imaging of the abdomen and pelvis was performed using the standard protocol during bolus administration of intravenous contrast. RADIATION DOSE REDUCTION: This exam was performed according to the departmental dose-optimization program which includes automated exposure control, adjustment of the mA and/or kV according to patient size and/or use of iterative reconstruction technique. CONTRAST:  OMNIPAQUE  IOHEXOL  350 MG/ML SOLN COMPARISON:  Chest radiograph of earlier today. Chest abdomen and pelvic CTs of 03/14/2024. FINDINGS: CTA CHEST FINDINGS Cardiovascular: The quality of this exam for evaluation of pulmonary embolism is good. The bolus is well timed. There is minimal motion degradation. No evidence of pulmonary embolism. Bovine arch. Aortic atherosclerosis. Normal heart size, without pericardial effusion. Mediastinum/Nodes: No supraclavicular adenopathy. Right paratracheal adenopathy at 2.5 cm on 57/4, similar. Right hilar adenopathy at 1.8 cm on 62/4, poorly evaluated on the prior noncontrast exam. Left suprahilar/AP window node of 11 mm on 59/4, similar. Right retrocrural node of 1.4 cm in 1/20 7/4, similar to 1.5 cm previously. Lungs/Pleura: Trace left pleural fluid is similar. Bilateral pulmonary nodules/metastasis. An index superior segment left lower lobe pulmonary nodule measures 11 mm on 54/12 versus 12 mm on the prior. Slightly less well-defined today. Medial right apical nodule measures 1.9 x 1.5 cm on 35/12 and is similar to on the prior (when remeasured). Musculoskeletal: Lytic lesion within the left humeral head on 24/14 at 2.8 cm. Similar to on the prior, but felt to be increased compared  to 06/30/2022. Remote ninth posterior right rib fracture. Review of the MIP images confirms the above findings. CT ABDOMEN and PELVIS FINDINGS Hepatobiliary: Normal liver. Gallbladder sludge or noncalcified stones. Borderline gallbladder distension without specific evidence of acute cholecystitis or biliary duct dilatation. Pancreas: Normal, without mass or ductal dilatation. Spleen: Subcentimeter low-density subcapsular splenic lesions are of doubtful clinical significance. Adrenals/Urinary Tract: Normal right adrenal gland. Complex hypoattenuating right renal lesions including up to 1.3 cm on 23/2. New and increased since the most recent contrast enhanced exam of 01/30/2023. Infiltrative left renal mass with direct extension into the abdominal retroperitoneum again identified. Estimated at 13.4 x 10.5 cm on 39/2. Compare 12.9 x 10.4 cm on the prior exam. Results in moderate left-sided hydronephrosis and decreased left renal function/excretion on delayed images. Normal urinary bladder. Stomach/Bowel: Normal stomach, without wall thickening. Scattered colonic diverticula. Normal terminal ileum. Vascular/Lymphatic: Aortic atherosclerosis. Mass effect upon the IVC from the infiltrative retroperitoneal mass. No thrombus. Direct tumor extension and/or adenopathy throughout the abdominal retroperitoneum as detailed above. Example nodal conglomerate at the level of the interpolar right kidney measuring 9.5 x 5.1 cm on 43/2. Compare 9.7 x 5.2 cm on the prior, suggesting stability. Pelvic adenopathy, with an index right external iliac node measuring 1.3 cm on 65/2 versus 1.1 cm on the prior. Reproductive: Normal prostate. Inguinal position of the right testicle. Other: Trace pelvic fluid mildly increased. No free intraperitoneal air. Musculoskeletal: Direct tumor extension into the left paraspinous musculature, as evidenced by heterogeneous enhancement including on 33/2. Review of the MIP images confirms the above findings.  IMPRESSION: CT CHEST IMPRESSION 1.  No evidence of pulmonary embolism. 2. Similar pulmonary and thoracic nodal metastasis since 03/14/2024. 3. Similar trace left pleural fluid. 4. 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 5.  Aortic Atherosclerosis (ICD10-I70.0). CT ABDOMEN AND PELVIS IMPRESSION 1. Similar appearance of infiltrative left renal mass with direct retroperitoneal and left paraspinous muscular extension. Concurrent or contiguous abdominal retroperitoneal and pelvic adenopathy. 2. No bowel obstruction or other superimposed acute  process 3. Gallbladder sludge or noncalcified stones with borderline distention but no specific evidence of acute cholecystitis 4.  Aortic Atherosclerosis (ICD10-I70.0). 5. Complex right renal lesions which are new and increased since the most recent contrast-enhanced exam of 2024. Suspicious for metastasis. Electronically Signed   By: Rockey Kilts M.D.   On: 04/02/2024 11:27   DG Chest Portable 1 View Result Date: 04/02/2024 CLINICAL DATA:  Shortness of breath. EXAM: PORTABLE CHEST 1 VIEW COMPARISON:  10/15/2020 FINDINGS: No evidence for focal lung consolidation. No pulmonary edema or pleural effusion. Scattered bilateral pulmonary nodules better characterized on recent chest CT from 03/14/2024. The cardiopericardial silhouette is within normal limits for size. No acute bony abnormality. Telemetry leads overlie the chest. IMPRESSION: 1. No acute cardiopulmonary findings. 2. Scattered bilateral pulmonary nodules better characterized on recent chest CT. Electronically Signed   By: Camellia Candle M.D.   On: 04/02/2024 07:19   CT CHEST ABDOMEN PELVIS WO CONTRAST Result Date: 03/28/2024 CLINICAL DATA:  Left renal cell carcinoma, metastatic disease evaluation * Tracking Code: BO * EXAM: CT CHEST, ABDOMEN AND PELVIS WITHOUT CONTRAST TECHNIQUE: Multidetector CT imaging of the chest, abdomen and pelvis was performed following the standard protocol without  IV contrast. RADIATION DOSE REDUCTION: This exam was performed according to the departmental dose-optimization program which includes automated exposure control, adjustment of the mA and/or kV according to patient size and/or use of iterative reconstruction technique. COMPARISON:  01/16/2024 FINDINGS: CT CHEST FINDINGS Cardiovascular: No significant vascular findings. Normal heart size. No pericardial effusion. Mediastinum/Nodes: Interval enlargement of mediastinal and hilar lymph nodes, right paratracheal node measuring 2.8 x 2.5 cm, previously 1.7 x 1.4 cm (series 2, image 23). Thyroid  gland, trachea, and esophagus demonstrate no significant findings. Lungs/Pleura: Numerous bilateral pulmonary nodules increased in size and number. Index nodule in the superior segment left lower lobe measures 1.2 cm, previously 0.6 cm (series 4, image 50). Musculoskeletal: No chest wall abnormality. No acute osseous findings. CT ABDOMEN PELVIS FINDINGS Hepatobiliary: No solid liver abnormality is seen. Sludge in the gallbladder (series 2, image 63). Gallbladder wall thickening, or biliary dilatation. Pancreas: Tenting of the pancreas by underlying large renal mass. No pancreatic ductal dilatation or surrounding inflammatory changes. Spleen: Normal in size. Direct contact of the spleen by large left renal mass. Adrenals/Urinary Tract: Normal right adrenal. Interval enlargement of a bulky, confluent mass centered in the left renal pelvis and confluent with metastatic lymphadenopathy in the retroperitoneum. This is difficult to discretely measure on noncontrast examination although the overall confluent tumor bulk is at least 12.9 x 10.4 cm in cross-section, previously 8.7 x 8.2 cm when measured with similar technique (series 2, image 64). Severe calyceal dilatation of the left kidney secondary to complete effacement of the left renal pelvis by tumor. Right kidney is normal in noncontrast appearance, without calculi or hydronephrosis.  Bladder is unremarkable. Stomach/Bowel: Stomach is within normal limits. Appendix appears normal. No evidence of bowel wall thickening, distention, or inflammatory changes. Vascular/Lymphatic: No significant vascular findings. Interval increase in diffuse, matted bulky lymphadenopathy throughout the retroperitoneum and mesentery, and involving the underlying left psoas musculature. Nodes are at this point difficult to discretely measure; matted retroperitoneal lymphadenopathy encasing the infrarenal aorta and inferior vena cava measures 9.7 x 5.2 cm in cross-section (series 2, image 74). Interval enlargement of bilateral iliac lymph nodes. Reproductive: Mild prostatomegaly with median lobe hypertrophy. Other: No abdominal wall hernia or abnormality. No ascites. Musculoskeletal: No acute osseous findings. IMPRESSION: 1. Interval enlargement of a bulky mass centered in  the left renal pelvis with adjacent, confluent bulky retroperitoneal lymphadenopathy. 2. Severe calyceal dilatation of the left kidney secondary to complete effacement of the left renal pelvis by tumor. 3. Interval enlargement of mediastinal, hilar, retroperitoneal and bilateral iliac lymph nodes. 4. Numerous bilateral pulmonary nodules increased in size and number. 5. Constellation of findings consistent with substantial progression primary renal malignancy and widespread metastatic disease. Electronically Signed   By: Marolyn JONETTA Jaksch M.D.   On: 03/28/2024 19:27    Microbiology: No results found for this or any previous visit (from the past 240 hours).   Labs: Basic Metabolic Panel: No results for input(s): NA, K, CL, CO2, GLUCOSE, BUN, CREATININE, CALCIUM, MG, PHOS in the last 168 hours. Liver Function Tests: No results for input(s): AST, ALT, ALKPHOS, BILITOT, PROT, ALBUMIN  in the last 168 hours. No results for input(s): LIPASE, AMYLASE in the last 168 hours. No results for input(s): AMMONIA in the last  168 hours. CBC: No results for input(s): WBC, NEUTROABS, HGB, HCT, MCV, PLT in the last 168 hours. Cardiac Enzymes: No results for input(s): CKTOTAL, CKMB, CKMBINDEX, TROPONINI in the last 168 hours. D-Dimer No results for input(s): DDIMER in the last 72 hours. BNP: Invalid input(s): POCBNP CBG: Recent Labs  Lab 04/06/24 0800 04/06/24 1135 04/07/24 0005 04/08/24 2055 04/09/24 0751  GLUCAP 107* 137* 134* 146* 146*   Anemia work up No results for input(s): VITAMINB12, FOLATE, FERRITIN, TIBC, IRON, RETICCTPCT in the last 72 hours. Urinalysis    Component Value Date/Time   COLORURINE YELLOW 04/02/2024 1355   APPEARANCEUR CLEAR 04/02/2024 1355   LABSPEC >1.046 (H) 04/02/2024 1355   PHURINE 5.0 04/02/2024 1355   GLUCOSEU NEGATIVE 04/02/2024 1355   HGBUR NEGATIVE 04/02/2024 1355   BILIRUBINUR NEGATIVE 04/02/2024 1355   KETONESUR 80 (A) 04/02/2024 1355   PROTEINUR NEGATIVE 04/02/2024 1355   NITRITE NEGATIVE 04/02/2024 1355   LEUKOCYTESUR NEGATIVE 04/02/2024 1355   Sepsis Labs No results for input(s): WBC in the last 168 hours.  Invalid input(s): PROCALCITONIN, LACTICIDVEN     SIGNED:  Alm Apo, MD  [facility] 2024/04/30, 2:33 PM

## 2024-05-01 DEATH — deceased

## 2024-05-14 ENCOUNTER — Other Ambulatory Visit

## 2024-05-14 ENCOUNTER — Encounter

## 2024-05-14 ENCOUNTER — Ambulatory Visit

## 2024-05-14 ENCOUNTER — Ambulatory Visit: Admitting: Physician Assistant
